# Patient Record
Sex: Female | Born: 1950
Health system: Southern US, Community
[De-identification: ages and names within clinical notes are randomized; demographics above are authoritative.]

## PROBLEM LIST (undated history)

## (undated) DIAGNOSIS — I639 Cerebral infarction, unspecified: Secondary | ICD-10-CM

## (undated) DIAGNOSIS — E790 Hyperuricemia without signs of inflammatory arthritis and tophaceous disease: Secondary | ICD-10-CM

## (undated) DIAGNOSIS — M1990 Unspecified osteoarthritis, unspecified site: Secondary | ICD-10-CM

## (undated) DIAGNOSIS — R7303 Prediabetes: Secondary | ICD-10-CM

## (undated) DIAGNOSIS — I739 Peripheral vascular disease, unspecified: Secondary | ICD-10-CM

## (undated) DIAGNOSIS — E785 Hyperlipidemia, unspecified: Secondary | ICD-10-CM

## (undated) DIAGNOSIS — I2510 Atherosclerotic heart disease of native coronary artery without angina pectoris: Secondary | ICD-10-CM

## (undated) DIAGNOSIS — M810 Age-related osteoporosis without current pathological fracture: Secondary | ICD-10-CM

## (undated) DIAGNOSIS — D649 Anemia, unspecified: Secondary | ICD-10-CM

## (undated) DIAGNOSIS — Z720 Tobacco use: Secondary | ICD-10-CM

## (undated) DIAGNOSIS — I10 Essential (primary) hypertension: Secondary | ICD-10-CM

## (undated) HISTORY — DX: Tobacco use: Z72.0

## (undated) HISTORY — DX: Hyperlipidemia, unspecified: E78.5

## (undated) HISTORY — DX: Peripheral vascular disease, unspecified: I73.9

## (undated) HISTORY — PX: JOINT REPLACEMENT: SHX530

## (undated) HISTORY — PX: OVARIAN CYST SURGERY: SHX726

## (undated) HISTORY — PX: CORONARY ANGIOPLASTY: SHX604

## (undated) HISTORY — DX: Hyperuricemia without signs of inflammatory arthritis and tophaceous disease: E79.0

## (undated) HISTORY — DX: Essential (primary) hypertension: I10

## (undated) HISTORY — DX: Age-related osteoporosis without current pathological fracture: M81.0

---

## 2005-01-14 ENCOUNTER — Emergency Department: Payer: Self-pay | Admitting: Unknown Physician Specialty

## 2005-01-14 ENCOUNTER — Other Ambulatory Visit: Payer: Self-pay

## 2005-08-05 ENCOUNTER — Ambulatory Visit: Payer: Self-pay | Admitting: Family Medicine

## 2007-06-25 ENCOUNTER — Emergency Department: Payer: Self-pay | Admitting: Emergency Medicine

## 2007-06-25 ENCOUNTER — Other Ambulatory Visit: Payer: Self-pay

## 2010-02-28 DIAGNOSIS — I739 Peripheral vascular disease, unspecified: Secondary | ICD-10-CM

## 2010-02-28 HISTORY — PX: ANGIOPLASTY: SHX39

## 2010-02-28 HISTORY — DX: Peripheral vascular disease, unspecified: I73.9

## 2010-03-17 ENCOUNTER — Ambulatory Visit: Payer: Self-pay | Admitting: Vascular Surgery

## 2010-05-01 HISTORY — PX: POPLITEAL ARTERY STENT: SHX2243

## 2010-05-12 ENCOUNTER — Ambulatory Visit: Payer: Self-pay | Admitting: Vascular Surgery

## 2010-07-01 HISTORY — PX: POPLITEAL ARTERY STENT: SHX2243

## 2010-07-07 ENCOUNTER — Ambulatory Visit: Payer: Self-pay | Admitting: Vascular Surgery

## 2010-08-31 HISTORY — PX: OTHER SURGICAL HISTORY: SHX169

## 2010-09-15 ENCOUNTER — Ambulatory Visit: Payer: Self-pay | Admitting: Vascular Surgery

## 2010-09-25 ENCOUNTER — Inpatient Hospital Stay: Payer: Self-pay | Admitting: Vascular Surgery

## 2010-09-29 LAB — PATHOLOGY REPORT

## 2010-10-17 ENCOUNTER — Inpatient Hospital Stay: Payer: Self-pay | Admitting: Vascular Surgery

## 2011-10-26 ENCOUNTER — Ambulatory Visit: Payer: Self-pay | Admitting: Vascular Surgery

## 2011-10-26 LAB — CREATININE, SERUM
Creatinine: 0.84 mg/dL (ref 0.60–1.30)
EGFR (African American): >60
EGFR (Non-African Amer.): >60

## 2011-10-26 LAB — BUN: BUN: 9 mg/dL (ref 7–18)

## 2011-10-26 LAB — PROTIME-INR
INR: 2
Prothrombin Time: 23.2 secs — ABNORMAL HIGH (ref 11.5–14.7)

## 2012-01-14 ENCOUNTER — Ambulatory Visit: Payer: Self-pay

## 2012-01-21 ENCOUNTER — Ambulatory Visit: Payer: Self-pay | Admitting: Vascular Surgery

## 2012-01-21 LAB — BASIC METABOLIC PANEL
Anion Gap: 10 (ref 7–16)
BUN: 9 mg/dL (ref 7–18)
Calcium, Total: 9.3 mg/dL (ref 8.5–10.1)
Chloride: 98 mmol/L (ref 98–107)
Co2: 30 mmol/L (ref 21–32)
Creatinine: 0.76 mg/dL (ref 0.60–1.30)
EGFR (African American): >60
EGFR (Non-African Amer.): >60
Glucose: 117 mg/dL — ABNORMAL HIGH (ref 65–99)
Osmolality: 275 (ref 275–301)
Potassium: 2.8 mmol/L — ABNORMAL LOW (ref 3.5–5.1)
Sodium: 138 mmol/L (ref 136–145)

## 2013-03-16 DIAGNOSIS — I70219 Atherosclerosis of native arteries of extremities with intermittent claudication, unspecified extremity: Secondary | ICD-10-CM | POA: Diagnosis not present

## 2013-05-30 DIAGNOSIS — Z0000 Encounter for general adult medical examination without abnormal findings: Secondary | ICD-10-CM | POA: Diagnosis not present

## 2013-05-30 DIAGNOSIS — E785 Hyperlipidemia, unspecified: Secondary | ICD-10-CM | POA: Diagnosis not present

## 2013-05-30 DIAGNOSIS — I10 Essential (primary) hypertension: Secondary | ICD-10-CM | POA: Diagnosis not present

## 2013-05-30 DIAGNOSIS — I2510 Atherosclerotic heart disease of native coronary artery without angina pectoris: Secondary | ICD-10-CM | POA: Diagnosis not present

## 2013-06-08 DIAGNOSIS — Z1231 Encounter for screening mammogram for malignant neoplasm of breast: Secondary | ICD-10-CM | POA: Diagnosis not present

## 2013-06-08 DIAGNOSIS — N63 Unspecified lump in unspecified breast: Secondary | ICD-10-CM | POA: Diagnosis not present

## 2013-06-16 DIAGNOSIS — N6489 Other specified disorders of breast: Secondary | ICD-10-CM | POA: Diagnosis not present

## 2013-06-16 DIAGNOSIS — R928 Other abnormal and inconclusive findings on diagnostic imaging of breast: Secondary | ICD-10-CM | POA: Diagnosis not present

## 2013-06-16 LAB — HM MAMMOGRAPHY

## 2013-08-15 DIAGNOSIS — I10 Essential (primary) hypertension: Secondary | ICD-10-CM | POA: Diagnosis not present

## 2013-08-15 DIAGNOSIS — E785 Hyperlipidemia, unspecified: Secondary | ICD-10-CM | POA: Diagnosis not present

## 2013-08-15 DIAGNOSIS — I70219 Atherosclerosis of native arteries of extremities with intermittent claudication, unspecified extremity: Secondary | ICD-10-CM | POA: Diagnosis not present

## 2013-11-15 DIAGNOSIS — I10 Essential (primary) hypertension: Secondary | ICD-10-CM | POA: Diagnosis not present

## 2013-12-06 DIAGNOSIS — M722 Plantar fascial fibromatosis: Secondary | ICD-10-CM | POA: Diagnosis not present

## 2014-02-13 DIAGNOSIS — F17200 Nicotine dependence, unspecified, uncomplicated: Secondary | ICD-10-CM | POA: Diagnosis not present

## 2014-02-13 DIAGNOSIS — I739 Peripheral vascular disease, unspecified: Secondary | ICD-10-CM | POA: Diagnosis not present

## 2014-02-13 DIAGNOSIS — I70219 Atherosclerosis of native arteries of extremities with intermittent claudication, unspecified extremity: Secondary | ICD-10-CM | POA: Diagnosis not present

## 2014-02-13 DIAGNOSIS — G609 Hereditary and idiopathic neuropathy, unspecified: Secondary | ICD-10-CM | POA: Diagnosis not present

## 2014-06-19 DIAGNOSIS — I8000 Phlebitis and thrombophlebitis of superficial vessels of unspecified lower extremity: Secondary | ICD-10-CM | POA: Diagnosis not present

## 2014-06-20 DIAGNOSIS — Z0000 Encounter for general adult medical examination without abnormal findings: Secondary | ICD-10-CM | POA: Diagnosis not present

## 2014-07-17 DIAGNOSIS — J42 Unspecified chronic bronchitis: Secondary | ICD-10-CM | POA: Diagnosis not present

## 2014-07-17 DIAGNOSIS — Z124 Encounter for screening for malignant neoplasm of cervix: Secondary | ICD-10-CM | POA: Diagnosis not present

## 2014-07-17 DIAGNOSIS — N898 Other specified noninflammatory disorders of vagina: Secondary | ICD-10-CM | POA: Diagnosis not present

## 2014-07-17 DIAGNOSIS — R05 Cough: Secondary | ICD-10-CM | POA: Diagnosis not present

## 2014-07-17 LAB — HM PAP SMEAR: HM Pap smear: NEGATIVE

## 2014-08-28 DIAGNOSIS — I739 Peripheral vascular disease, unspecified: Secondary | ICD-10-CM | POA: Diagnosis not present

## 2014-08-28 DIAGNOSIS — I10 Essential (primary) hypertension: Secondary | ICD-10-CM | POA: Diagnosis not present

## 2014-08-28 DIAGNOSIS — E785 Hyperlipidemia, unspecified: Secondary | ICD-10-CM | POA: Diagnosis not present

## 2014-09-05 DIAGNOSIS — I2510 Atherosclerotic heart disease of native coronary artery without angina pectoris: Secondary | ICD-10-CM | POA: Diagnosis not present

## 2014-09-05 DIAGNOSIS — I739 Peripheral vascular disease, unspecified: Secondary | ICD-10-CM | POA: Diagnosis not present

## 2014-09-05 DIAGNOSIS — I209 Angina pectoris, unspecified: Secondary | ICD-10-CM | POA: Diagnosis not present

## 2014-09-05 DIAGNOSIS — R011 Cardiac murmur, unspecified: Secondary | ICD-10-CM | POA: Diagnosis not present

## 2014-09-26 DIAGNOSIS — R011 Cardiac murmur, unspecified: Secondary | ICD-10-CM | POA: Diagnosis not present

## 2014-09-26 DIAGNOSIS — I2510 Atherosclerotic heart disease of native coronary artery without angina pectoris: Secondary | ICD-10-CM | POA: Diagnosis not present

## 2014-10-03 DIAGNOSIS — I739 Peripheral vascular disease, unspecified: Secondary | ICD-10-CM | POA: Diagnosis not present

## 2014-10-03 DIAGNOSIS — I2510 Atherosclerotic heart disease of native coronary artery without angina pectoris: Secondary | ICD-10-CM | POA: Diagnosis not present

## 2014-10-03 DIAGNOSIS — I10 Essential (primary) hypertension: Secondary | ICD-10-CM | POA: Diagnosis not present

## 2014-10-03 DIAGNOSIS — F17200 Nicotine dependence, unspecified, uncomplicated: Secondary | ICD-10-CM | POA: Diagnosis not present

## 2014-10-17 DIAGNOSIS — I10 Essential (primary) hypertension: Secondary | ICD-10-CM | POA: Diagnosis not present

## 2014-10-17 DIAGNOSIS — Z79891 Long term (current) use of opiate analgesic: Secondary | ICD-10-CM | POA: Diagnosis not present

## 2014-10-19 DIAGNOSIS — F17290 Nicotine dependence, other tobacco product, uncomplicated: Secondary | ICD-10-CM | POA: Diagnosis not present

## 2014-10-19 DIAGNOSIS — I10 Essential (primary) hypertension: Secondary | ICD-10-CM | POA: Diagnosis not present

## 2014-10-19 DIAGNOSIS — E876 Hypokalemia: Secondary | ICD-10-CM | POA: Diagnosis not present

## 2014-10-19 DIAGNOSIS — I2510 Atherosclerotic heart disease of native coronary artery without angina pectoris: Secondary | ICD-10-CM | POA: Diagnosis not present

## 2014-10-26 DIAGNOSIS — I10 Essential (primary) hypertension: Secondary | ICD-10-CM | POA: Diagnosis not present

## 2014-10-26 DIAGNOSIS — I2510 Atherosclerotic heart disease of native coronary artery without angina pectoris: Secondary | ICD-10-CM | POA: Diagnosis not present

## 2014-10-26 DIAGNOSIS — E876 Hypokalemia: Secondary | ICD-10-CM | POA: Diagnosis not present

## 2014-12-23 NOTE — Op Note (Signed)
PATIENT NAME:  Mary [name], Mary [name] MR#:  [id number] DATE OF BIRTH:  08-29-1951  DATE OF PROCEDURE:  01/21/2012  PREOPERATIVE DIAGNOSES:  1. Peripheral arterial disease with rest pain with acute worsening over the past week or two.  2. Multiple previous right lower extremity revascularizations.  3. Hypertension.   POSTOPERATIVE DIAGNOSES:  1. Peripheral arterial disease with rest pain with acute worsening over the past week or two.  2. Multiple previous right lower extremity revascularizations.  3. Hypertension.   PROCEDURES:    1. Catheter placement into right profunda femoris artery and the third order branch of the profunda femoris artery from left femoral approach.  2. Aortogram and selective right lower extremity angiogram.  3. Catheter directed thrombolysis with 4 mg of TPA in the right common femoral artery and profunda femoris artery.  4. Mechanical rheolytic thrombectomy to right common femoral artery and profunda femoris artery.  5. Percutaneous transluminal angioplasty with 4 mm diameter angioplasty balloon to second order profunda femoris branch.  6. Percutaneous transluminal angioplasty with 6 mm diameter angioplasty balloon to proximal primary profunda and common femoral artery.  7. StarClose closure device, left femoral artery.   SURGEON: Algernon Huxley, M.D.   ANESTHESIA: Local with moderate conscious sedation.   ESTIMATED BLOOD LOSS: Minimal.   INDICATION FOR PROCEDURE: This is a 64 year old African American female with severe peripheral arterial disease. She has undergone previous operations for the right lower extremity for ischemia including failed bypass and multiple percutaneous revascularizations. She had a known SFA occlusion and had done as well as possible with short distance claudication until recently when she developed severe ischemic rest pain of the right foot. Her noninvasive studies also showed a drop in her perfusion on that side. She is brought in for an  angiogram to see if any intervention is at all possible. Risks and benefits are discussed. Informed consent was obtained.   DESCRIPTION OF PROCEDURE: The patient was brought to the vascular interventional radiology suite. Groins were shaved and prepped and a sterile surgical field was created. The left femoral head was localized with fluoroscopy and the left femoral artery was accessed without difficulty with a Seldinger needle. A J-wire was placed and a 5 French sheath was placed. A pigtail catheter was placed in the aorta at the L1 level. AP aortogram was performed. This showed no flow-limiting stenosis in the aortoiliac segments with some diffuse calcification with single renal vessels bilaterally. I then hooked the aortic bifurcation and advanced to the right femoral head. This demonstrated thrombus and occlusion in the common femoral artery and the previously patent profunda femoris artery. The superficial femoral artery and her bypass graft were known to be chronically occluded. She did reconstitute her profunda distally. The patient was given 3,000 units of intravenous heparin for systemic anticoagulation. A 6 French Ansel sheath was placed over a Terumo advantage wire. I was able to cross into the distal profunda after several branches into what was essentially a tertiary profunda branch and confirm intraluminal flow with a Kumpe catheter. I then replaced the Terumo advantage wire. On imaging, there was clearly some acute or subacute thrombus in this region. For this reason, I placed 4 mg of TPA for thrombolysis and then performed mechanical rheolytic thrombectomy with the AngioJet proxy catheter in the common femoral and profunda femoris arteries. The TPA was allowed to dwell for 10 to 15 minutes. Following this, angioplasty was performed down the second order profunda branch for significant residual flow limitation secondary to thrombosis. The  proximal profunda femoris artery and common femoral artery  had some native disease as well as thrombosis that was treated with a 6 mm diameter angioplasty balloon and then a 7 mm diameter angioplasty balloon. Following this, there was markedly improved flow. The profunda was now patent. There were some irregularities proximally, but this was a terrible location for stent placement and so this was not performed. The flow was markedly improved through profunda collaterals. At this point, I elected to terminate the procedure. The sheath was pulled back to the ipsilateral external iliac artery and oblique arteriogram was performed. StarClose closure device was deployed in the usual fashion with excellent hemostatic result. The patient tolerated the procedure well and was taken to the recovery room in stable condition.   ____________________________ Algernon Huxley, MD jsd:ap D: 01/21/2012 12:46:37 ET                T: 01/21/2012 15:45:07 ET JOB#: [id number] cc: Algernon Huxley, MD, <Dictator> Algernon Huxley MD ELECTRONICALLY SIGNED 01/27/2012 14:40

## 2014-12-23 NOTE — Op Note (Signed)
PATIENT NAME:  Mary [name], PINARD MR#:  [id number] DATE OF BIRTH:  Jan 26, 1951  DATE OF PROCEDURE:  10/26/2011  PREOPERATIVE DIAGNOSES:  1. Peripheral arterial disease with ulceration right lower extremity.  2. Status post failed percutaneous and open revascularization right lower extremity.  3. Hypertension.   POSTOPERATIVE DIAGNOSES:  1. Peripheral arterial disease with ulceration right lower extremity.  2. Status post failed percutaneous and open revascularization right lower extremity.  3. Hypertension.   PROCEDURE: 1. Ultrasound guidance for vascular access, left femoral artery.  2. Catheter placement into right popliteal artery from left femoral approach.  3. Catheter placement into right profunda femoris artery from left femoral approach.  4. Aortogram and selective right lower extremity angiogram.  5. Percutaneous transluminal angioplasty of proximal right profunda femoris artery with a 5-mm diameter angioplasty balloon.  6. Percutaneous transluminal angioplasty of right common femoral artery with 6 and 7-mm diameter angioplasty balloons.  7. StarClose closure device, left femoral artery.   SURGEON: Algernon Huxley, M.D.   ANESTHESIA: Local with moderate conscious sedation.   ESTIMATED BLOOD LOSS: 25 mL.   INDICATION FOR PROCEDURE: This is a female well known to me for her severe peripheral arterial disease. She has had progression of her right foot pain to the point where she now has ulceration, which is nonhealing, and ischemic rest pain. The patient is brought back for angiographic evaluation to determine if any endovascular options or open options are available at this point prior to amputation.   DESCRIPTION OF PROCEDURE: The patient is brought to the vascular/interventional radiology suite. Groins were shaved and prepped and a sterile surgical field was created. Due to her multiple previous accesses on the left, ultrasound was used to access a patent left femoral artery with  minimal difficulty, and permanent image was recorded. A 5 French sheath was placed. Pigtail catheter was placed in the aorta at the L1 level and AP aortogram was performed. This showed normal renal vessels with no flow-limiting stenosis in the aortic or  iliac segments. I then hooked the aortic bifurcation and advanced to the right femoral head. Selective right lower extremity angiogram was then performed. This showed a stenosis in the common femoral artery just proximal to the previous bypass graft, which may have represented intimal hyperplasia from previous clamp. This tracked down into the origin of the profunda artery, which seemed a bit narrowed. There were then good profunda collaterals down to an anterior tibial artery which was the only runoff distally. She had a long segment SFA occlusion. The previously placed right SFA and popliteal stents were occluded. The patient was given 3000 units of intravenous heparin for systemic anticoagulation.  I spent some time trying to cross her SFA popliteal occlusion, and could get into the stents in the popliteal artery but could never reenter  distally. I would be somewhat pessimistic that any of these interventions would be durable as they have not been previously. After about 20 minutes of fluoroscopy. I elected to terminate this effort at this point.  I came back up and got a wire into the profunda femoris artery and exchanged for a Magic torque wire over a Kumpe catheter. Imaging in this area allowed [place] to further delineate the hyperplastic lesion in the common femoral artery and this was angioplastied with a 6 and a 7-mm diameter angioplasty balloon. A 5-mm diameter angioplasty balloon was inflated in the proximal profunda femoris artery. Following this there was improved flow. There was significant irregularity in the common femoral artery  and into the superficial femoral artery, likely due to the plane into the SFA that was generated with our attempt at  revascularizing that long-segment occlusion. At this point, the flow had been improved as best we could endovascular.  She would potentially have a femoral to anterior tibial bypass option remaining.  I elected to terminate the procedure. The sheath was pulled back to the ipsilateral external iliac artery.  Oblique arteriogram was performed.  StarClose closure device was deployed in the usual fashion with excellent hemostatic result. The patient tolerated the procedure well and was taken to the recovery room in stable condition.   ____________________________ Algernon Huxley, MD jsd:bjt D: 10/26/2011 11:39:23 ET T: 10/26/2011 11:51:11 ET JOB#: [id number]  cc: Algernon Huxley, MD, <Dictator> Algernon Huxley MD ELECTRONICALLY SIGNED 10/26/2011 15:49

## 2015-02-25 DIAGNOSIS — I10 Essential (primary) hypertension: Secondary | ICD-10-CM | POA: Insufficient documentation

## 2015-02-25 DIAGNOSIS — E785 Hyperlipidemia, unspecified: Secondary | ICD-10-CM | POA: Insufficient documentation

## 2015-02-25 DIAGNOSIS — E782 Mixed hyperlipidemia: Secondary | ICD-10-CM | POA: Insufficient documentation

## 2015-02-25 DIAGNOSIS — I739 Peripheral vascular disease, unspecified: Secondary | ICD-10-CM | POA: Insufficient documentation

## 2015-02-25 DIAGNOSIS — Z720 Tobacco use: Secondary | ICD-10-CM | POA: Insufficient documentation

## 2015-02-25 DIAGNOSIS — E790 Hyperuricemia without signs of inflammatory arthritis and tophaceous disease: Secondary | ICD-10-CM | POA: Insufficient documentation

## 2015-02-27 ENCOUNTER — Ambulatory Visit (INDEPENDENT_AMBULATORY_CARE_PROVIDER_SITE_OTHER): Payer: Medicare Other | Admitting: Family Medicine

## 2015-02-27 ENCOUNTER — Encounter: Payer: Self-pay | Admitting: Family Medicine

## 2015-02-27 VITALS — BP 139/84 | HR 87 | Temp 98.2°F | Wt 132.0 lb

## 2015-02-27 DIAGNOSIS — T502X5A Adverse effect of carbonic-anhydrase inhibitors, benzothiadiazides and other diuretics, initial encounter: Secondary | ICD-10-CM | POA: Diagnosis not present

## 2015-02-27 DIAGNOSIS — E876 Hypokalemia: Secondary | ICD-10-CM | POA: Diagnosis not present

## 2015-02-27 DIAGNOSIS — M10272 Drug-induced gout, left ankle and foot: Secondary | ICD-10-CM | POA: Diagnosis not present

## 2015-02-27 DIAGNOSIS — I10 Essential (primary) hypertension: Secondary | ICD-10-CM | POA: Diagnosis not present

## 2015-02-27 DIAGNOSIS — E785 Hyperlipidemia, unspecified: Secondary | ICD-10-CM

## 2015-02-27 DIAGNOSIS — Z720 Tobacco use: Secondary | ICD-10-CM

## 2015-02-27 DIAGNOSIS — E790 Hyperuricemia without signs of inflammatory arthritis and tophaceous disease: Secondary | ICD-10-CM | POA: Diagnosis not present

## 2015-02-27 DIAGNOSIS — Z5181 Encounter for therapeutic drug level monitoring: Secondary | ICD-10-CM | POA: Diagnosis not present

## 2015-02-27 DIAGNOSIS — Z7901 Long term (current) use of anticoagulants: Secondary | ICD-10-CM | POA: Diagnosis not present

## 2015-02-27 MED ORDER — TRAMADOL HCL 50 MG PO TABS: 50.0000 mg | ORAL_TABLET | Freq: Four times a day (QID) | ORAL | Status: DC | PRN

## 2015-02-27 MED ORDER — LISINOPRIL 10 MG PO TABS: 10.0000 mg | ORAL_TABLET | Freq: Every day | ORAL | Status: DC

## 2015-02-27 MED ORDER — COLCHICINE 0.6 MG PO TABS: ORAL_TABLET | ORAL | Status: DC

## 2015-02-27 MED ORDER — FERROUS FUMARATE 86 (27 FE) MG PO CAPS: 1.0000 | ORAL_CAPSULE | ORAL | Status: DC

## 2015-02-27 NOTE — Assessment & Plan Note (Signed)
Monitored by Dr. Lucky Cowboy; on statin

## 2015-02-27 NOTE — Patient Instructions (Addendum)
Stop HCTZ Stop potassium pills  Medications Discontinued During This Encounter  Medication Reason  . hydrochlorothiazide (HYDRODIURIL) 25 MG tablet Discontinued by provider  . potassium chloride (K-DUR) 10 MEQ tablet Discontinued by provider    Start colchicine today for the gout attack Take one Aleve twice a day for the next week to help with gout (take with food) Use tramadol if needed for pain Start lisinopril for your blood pressure  Meds ordered this encounter  Medications  . warfarin (COUMADIN) 5 MG tablet    Sig: Take 5 mg by mouth.  . colchicine 0.6 MG tablet    Sig: Take two pills by mouth right away, and then one more pill one hour later (for gout attack)    Dispense:  3 tablet    Refill:  1  . traMADol (ULTRAM) 50 MG tablet    Sig: Take 1 tablet (50 mg total) by mouth every 6 (six) hours as needed.    Dispense:  12 tablet    Refill:  0  . Ferrous Fumarate (HIGH POTENCY IRON) 86 (27 FE) MG CAPS    Sig: Take 1 capsule by mouth every other day.    Dispense:  15 each    Refill:  0  . lisinopril (PRINIVIL,ZESTRIL) 10 MG tablet    Sig: Take 1 tablet (10 mg total) by mouth daily.    Dispense:  30 tablet    Refill:  0   Return in 3 weeks for recheck of the blood pressure and BMP (you do not need to be fasting for that)  Avoid gravies, liver, [place], chicken soup  Let Dr. Lucky Cowboy know about the medicine changes because the medicine changes may affect your INR (warfarin effect); he may want to check it sooner than your next scheduled visit  I'll strongly encourage you to quit smoking; it will be the best thing you can do for your health and your wallet    Gout Gout is when your joints become red, sore, and swell (inflamed). This is caused by the buildup of uric acid crystals in the joints. Uric acid is a chemical that is normally in the blood. If the level of uric acid gets too high in the blood, these crystals form in your joints and tissues. Over time, these crystals can  form into masses near the joints and tissues. These masses can destroy bone and cause the bone to look misshapen (deformed). HOME CARE   Do not take aspirin for pain.  Only take medicine as told by your doctor.  Rest the joint as much as you can. When in bed, keep sheets and blankets off painful areas.  Keep the sore joints raised (elevated).  Put warm or cold packs on painful joints. Use of warm or cold packs depends on which works best for you.  Use crutches if the painful joint is in your leg.  Drink enough fluids to keep your pee (urine) clear or pale yellow. Limit alcohol, sugary drinks, and drinks with fructose in them.  Follow your diet instructions. Pay careful attention to how much protein you eat. Include fruits, vegetables, whole grains, and fat-free or low-fat milk products in your daily diet. Talk to your doctor or dietitian about the use of coffee, vitamin C, and cherries. These may help lower uric acid levels.  Keep a healthy body weight. GET HELP RIGHT AWAY IF:   You have watery poop (diarrhea), throw up (vomit), or have any side effects from medicines.  You do not  feel better in 24 hours, or you are getting worse.  Your joint becomes suddenly more tender, and you have chills or a fever. MAKE SURE YOU:   Understand these instructions.  Will watch your condition.  Will get help right away if you are not doing well or get worse. Document Released: 05/26/2008 Document Revised: 01/01/2014 Document Reviewed: 03/30/2012 [facility] Patient Information 2015 [place], [place]. This information is not intended to replace advice given to you by your health care provider. Make sure you discuss any questions you have with your health care provider. Smoking Cessation, Tips for Success If you are ready to quit smoking, congratulations! You have chosen to help yourself be healthier. Cigarettes bring nicotine, tar, carbon monoxide, and other irritants into your body. Your lungs,  heart, and blood vessels will be able to work better without these poisons. There are many different ways to quit smoking. Nicotine gum, nicotine patches, a nicotine inhaler, or nicotine nasal spray can help with physical craving. Hypnosis, support groups, and medicines help break the habit of smoking. WHAT THINGS CAN I DO TO MAKE QUITTING EASIER?  Here are some tips to help you quit for good:  Pick a date when you will quit smoking completely. Tell all of your friends and family about your plan to quit on that date.  Do not try to slowly cut down on the number of cigarettes you are smoking. Pick a quit date and quit smoking completely starting on that day.  Throw away all cigarettes.   Clean and remove all ashtrays from your home, work, and car.  On a card, write down your reasons for quitting. Carry the card with you and read it when you get the urge to smoke.  Cleanse your body of nicotine. Drink enough water and fluids to keep your urine clear or pale yellow. Do this after quitting to flush the nicotine from your body.  Learn to predict your moods. Do not let a bad situation be your excuse to have a cigarette. Some situations in your life might tempt you into wanting a cigarette.  Never have "just one" cigarette. It leads to wanting another and another. Remind yourself of your decision to quit.  Change habits associated with smoking. If you smoked while driving or when feeling stressed, try other activities to replace smoking. Stand up when drinking your coffee. Brush your teeth after eating. Sit in a different chair when you read the paper. Avoid alcohol while trying to quit, and try to drink fewer caffeinated beverages. Alcohol and caffeine may urge you to smoke.  Avoid foods and drinks that can trigger a desire to smoke, such as sugary or spicy foods and alcohol.  Ask people who smoke not to smoke around you.  Have something planned to do right after eating or having a cup of  coffee. For example, plan to take a walk or exercise.  Try a relaxation exercise to calm you down and decrease your stress. Remember, you may be tense and nervous for the first 2 weeks after you quit, but this will pass.  Find new activities to keep your hands busy. Play with a pen, coin, or rubber band. Doodle or draw things on paper.  Brush your teeth right after eating. This will help cut down on the craving for the taste of tobacco after meals. You can also try mouthwash.   Use oral substitutes in place of cigarettes. Try using lemon drops, carrots, cinnamon sticks, or chewing gum. Keep them handy so  they are available when you have the urge to smoke.  When you have the urge to smoke, try deep breathing.  Designate your home as a nonsmoking area.  If you are a heavy smoker, ask your health care provider about a prescription for nicotine chewing gum. It can ease your withdrawal from nicotine.  Reward yourself. Set aside the cigarette money you save and buy yourself something nice.  Look for support from others. Join a support group or smoking cessation program. Ask someone at home or at work to help you with your plan to quit smoking.  Always ask yourself, "Do I need this cigarette or is this just a reflex?" Tell yourself, "Today, I choose not to smoke," or "I do not want to smoke." You are reminding yourself of your decision to quit.  Do not replace cigarette smoking with electronic cigarettes (commonly called e-cigarettes). The safety of e-cigarettes is unknown, and some may contain harmful chemicals.  If you relapse, do not give up! Plan ahead and think about what you will do the next time you get the urge to smoke. HOW WILL I FEEL WHEN I QUIT SMOKING? You may have symptoms of withdrawal because your body is used to nicotine (the addictive substance in cigarettes). You may crave cigarettes, be irritable, feel very hungry, cough often, get headaches, or have difficulty concentrating.  The withdrawal symptoms are only temporary. They are strongest when you first quit but will go away within 10-14 days. When withdrawal symptoms occur, stay in control. Think about your reasons for quitting. Remind yourself that these are signs that your body is healing and getting used to being without cigarettes. Remember that withdrawal symptoms are easier to treat than the major diseases that smoking can cause.  Even after the withdrawal is over, expect periodic urges to smoke. However, these cravings are generally short lived and will go away whether you smoke or not. Do not smoke! WHAT RESOURCES ARE AVAILABLE TO HELP ME QUIT SMOKING? Your health care provider can direct you to community resources or hospitals for support, which may include:  Group support.  Education.  Hypnosis.  Therapy. Document Released: 05/15/2004 Document Revised: 01/01/2014 Document Reviewed: 02/02/2013 [facility] Patient Information 2015 [place], [place]. This information is not intended to replace advice given to you by your health care provider. Make sure you discuss any questions you have with your health care provider.

## 2015-02-27 NOTE — Progress Notes (Signed)
BP 139/84 mmHg  Pulse 87  Temp(Src) 98.2 F (36.8 C)  Wt 132 lb (59.875 kg)  SpO2 98%   Subjective:    Patient ID: Mary [name], female    DOB: 10/28/1950, 64 y.o.   MRN: [id number]  HPI: Mary [name] is a 64 y.o. female  Chief Complaint  Patient presents with  . Foot Swelling    left X 1 week   Left foot base of 1st MTP Soaking it in warm water Taking extra-strength tylenol, a little bit of relief Hurt to bend the great toe No injury Pain is a 6 or 7 out of 10 Sore to the touch, wearing loose shoes Not working, on disability since 2012 First time she has ever had anything like this Brother had gout; it was in his knee  She takes HCTZ for high blood pressure; on it for 15 years; no other medicines for BP  Relevant past medical, surgical, family and social history reviewed and updated as indicated. Interim medical history since our last visit reviewed. Allergies and medications reviewed and updated.  Review of Systems  Per HPI unless specifically indicated above     Objective:    BP 139/84 mmHg  Pulse 87  Temp(Src) 98.2 F (36.8 C)  Wt 132 lb (59.875 kg)  SpO2 98%  Wt Readings from Last 3 Encounters:  02/27/15 132 lb (59.875 kg)  10/26/14 139 lb (63.05 kg)    Physical Exam  Constitutional: She appears well-developed and well-nourished. No distress.  HENT:  Head: Atraumatic.  Eyes: EOM are normal. No scleral icterus.  Neck: No thyromegaly present.  Cardiovascular: Normal rate, regular rhythm and normal heart sounds.   Pulmonary/Chest: Effort normal and breath sounds normal.  Abdominal: She exhibits no distension.  Musculoskeletal:  Marked swelling and tenderness of the 1st MTP on the left foot; limited ROM; no evidence of injury or trauma  Neurological: She exhibits normal muscle tone.  Skin: Skin is warm and dry.  Psychiatric: She has a normal mood and affect. Her behavior is normal. Judgment and thought content normal.       Assessment & Plan:    Problem List Items Addressed This Visit      Cardiovascular and Mediastinum   Hypertension    Stop HCTZ and start lisinopril; return in 3 weeks for recheck, bmp at that visit with me      Relevant Medications   warfarin (COUMADIN) 5 MG tablet   lisinopril (PRINIVIL,ZESTRIL) 10 MG tablet     Other   Hyperlipidemia    Monitored by Dr. Lucky Cowboy; on statin      Relevant Medications   warfarin (COUMADIN) 5 MG tablet   lisinopril (PRINIVIL,ZESTRIL) 10 MG tablet   Tobacco abuse    Encouraged smoking cessation in the wrap-up; tips for success given      Hyperuricemia    Likely secondary to HCTZ; stop it and start ACE-I instead      Encounter for monitoring coumadin therapy    Encouraged pt to contact the doctor who monitors her warfarin/Coumadin, as these changes will likely affect her INR; she will call Dr. Bunnie Domino office       Other Visit Diagnoses    Acute drug-induced gout of left foot    -  Primary    Relevant Medications    colchicine 0.6 MG tablet    traMADol (ULTRAM) 50 MG tablet    Other Relevant Orders    Uric acid    CBC with Differential/Platelet  Basic Metabolic Panel (BMET)    Diuretic-induced hypokalemia        will likely not be a problem now that we're stopping HCTZ; check BMP in 3 weeks; do not take extra K+ on the new BP med (ACE-I)    Relevant Orders    Basic Metabolic Panel (BMET)       Meds ordered this encounter  Medications  . warfarin (COUMADIN) 5 MG tablet    Sig: Take 5 mg by mouth.  . colchicine 0.6 MG tablet    Sig: Take two pills by mouth right away, and then one more pill one hour later (for gout attack)    Dispense:  3 tablet    Refill:  1  . traMADol (ULTRAM) 50 MG tablet    Sig: Take 1 tablet (50 mg total) by mouth every 6 (six) hours as needed.    Dispense:  12 tablet    Refill:  0  . Ferrous Fumarate (HIGH POTENCY IRON) 86 (27 FE) MG CAPS    Sig: Take 1 capsule by mouth every other day.    Dispense:  15 each    Refill:  0  .  lisinopril (PRINIVIL,ZESTRIL) 10 MG tablet    Sig: Take 1 tablet (10 mg total) by mouth daily.    Dispense:  30 tablet    Refill:  0   Orders Placed This Encounter  Procedures  . Uric acid  . CBC with Differential/Platelet  . Basic Metabolic Panel (BMET)   Follow up plan: Return in about 3 weeks (around 03/20/2015) for HTN, gout. An After Visit Summary was printed and given to the patient.

## 2015-02-27 NOTE — Assessment & Plan Note (Signed)
Stop HCTZ and start lisinopril; return in 3 weeks for recheck, bmp at that visit with me

## 2015-02-27 NOTE — Assessment & Plan Note (Signed)
Likely secondary to HCTZ; stop it and start ACE-I instead

## 2015-02-27 NOTE — Assessment & Plan Note (Signed)
Encouraged smoking cessation in the wrap-up; tips for success given

## 2015-02-27 NOTE — Assessment & Plan Note (Signed)
Encouraged pt to contact the doctor who monitors her warfarin/Coumadin, as these changes will likely affect her INR; she will call Dr. Bunnie Domino office

## 2015-02-28 LAB — CBC WITH DIFFERENTIAL/PLATELET
Basophils Absolute: 0 10*3/uL (ref 0.0–0.2)
Basos: 0 %
EOS (ABSOLUTE): 0.2 10*3/uL (ref 0.0–0.4)
Eos: 2 %
Hematocrit: 39.5 % (ref 34.0–46.6)
Hemoglobin: 13.9 g/dL (ref 11.1–15.9)
Immature Grans (Abs): 0 10*3/uL (ref 0.0–0.1)
Immature Granulocytes: 0 %
Lymphocytes Absolute: 4.4 10*3/uL — ABNORMAL HIGH (ref 0.7–3.1)
Lymphs: 59 %
MCH: 30.3 pg (ref 26.6–33.0)
MCHC: 35.2 g/dL (ref 31.5–35.7)
MCV: 86 fL (ref 79–97)
Monocytes Absolute: 0.5 10*3/uL (ref 0.1–0.9)
Monocytes: 7 %
Neutrophils Absolute: 2.4 10*3/uL (ref 1.4–7.0)
Neutrophils: 32 %
Platelets: 351 10*3/uL (ref 150–379)
RBC: 4.59 x10E6/uL (ref 3.77–5.28)
RDW: 14.3 % (ref 12.3–15.4)
WBC: 7.5 10*3/uL (ref 3.4–10.8)

## 2015-02-28 LAB — BASIC METABOLIC PANEL
BUN/Creatinine Ratio: 10 — ABNORMAL LOW (ref 11–26)
BUN: 7 mg/dL — ABNORMAL LOW (ref 8–27)
CO2: 29 mmol/L (ref 18–29)
Calcium: 9.3 mg/dL (ref 8.7–10.3)
Chloride: 95 mmol/L — ABNORMAL LOW (ref 97–108)
Creatinine, Ser: 0.69 mg/dL (ref 0.57–1.00)
GFR calc Af Amer: 109 mL/min/{1.73_m2} (ref >59)
GFR calc non Af Amer: 94 mL/min/{1.73_m2} (ref >59)
Glucose: 83 mg/dL (ref 65–99)
Potassium: 3.1 mmol/L — ABNORMAL LOW (ref 3.5–5.2)
Sodium: 141 mmol/L (ref 134–144)

## 2015-02-28 LAB — URIC ACID: Uric Acid: 8.7 mg/dL — ABNORMAL HIGH (ref 2.5–7.1)

## 2015-03-14 ENCOUNTER — Telehealth: Payer: Self-pay | Admitting: Family Medicine

## 2015-03-14 NOTE — Telephone Encounter (Signed)
Patient just wanted clarification on medications. She has stopped the HCTZ and started the new BP med.

## 2015-03-14 NOTE — Telephone Encounter (Signed)
Pt called would like Amy to call her concerning the following:  1- Gout medication and BP medication, should she stay on new BP medication  2- She should still take her Atorvastat?   Please call pt ASAP.

## 2015-03-20 ENCOUNTER — Ambulatory Visit: Payer: Medicare Other | Admitting: Family Medicine

## 2015-04-16 ENCOUNTER — Ambulatory Visit (INDEPENDENT_AMBULATORY_CARE_PROVIDER_SITE_OTHER): Payer: Medicare Other | Admitting: Family Medicine

## 2015-04-16 ENCOUNTER — Encounter: Payer: Self-pay | Admitting: Family Medicine

## 2015-04-16 VITALS — BP 139/82 | HR 79 | Temp 97.6°F | Ht 65.0 in | Wt 129.0 lb

## 2015-04-16 DIAGNOSIS — E790 Hyperuricemia without signs of inflammatory arthritis and tophaceous disease: Secondary | ICD-10-CM | POA: Diagnosis not present

## 2015-04-16 DIAGNOSIS — Z7901 Long term (current) use of anticoagulants: Secondary | ICD-10-CM

## 2015-04-16 DIAGNOSIS — I10 Essential (primary) hypertension: Secondary | ICD-10-CM

## 2015-04-16 DIAGNOSIS — E876 Hypokalemia: Secondary | ICD-10-CM | POA: Insufficient documentation

## 2015-04-16 DIAGNOSIS — Z5181 Encounter for therapeutic drug level monitoring: Secondary | ICD-10-CM

## 2015-04-16 MED ORDER — AMLODIPINE BESYLATE 2.5 MG PO TABS: 2.5000 mg | ORAL_TABLET | Freq: Every day | ORAL | Status: DC

## 2015-04-16 NOTE — Assessment & Plan Note (Signed)
Might be due to HCTZ; check labs today and replace if needed

## 2015-04-16 NOTE — Assessment & Plan Note (Signed)
Not quite to goal; she did not tolerate ACE-I; will start low dose CCB instead; avoid salt and decongestants

## 2015-04-16 NOTE — Assessment & Plan Note (Signed)
Limit purine-rich foods; check uric acid today

## 2015-04-16 NOTE — Assessment & Plan Note (Signed)
Monitored by Dr. Lucky Cowboy

## 2015-04-16 NOTE — Progress Notes (Signed)
BP 139/82 mmHg  Pulse 79  Temp(Src) 97.6 F (36.4 C)  Ht 5\' 5"  (1.651 m)  Wt 129 lb (58.514 kg)  BMI 21.47 kg/m2  SpO2 96%   Subjective:    Patient ID: [name], female    DOB: [birth year], 64 y.o.   MRN: [id number]  HPI: Mary [name] is a 64 y.o. female  Chief Complaint  Patient presents with  . Gout    recheck  . Hypertension    she could not tolerate the new BP med, started back on previous med    She had a flare of gout at last visit; HCTZ was stopped but she didn't tolerate the new medicine and went back on the HCTZ on her own; she made that change about two weeks ago The toe is better; she ate some chicken livers yesterday, just a little; limiting [place] and chicken noodle soups and gravies; used to love chicken noodle soup There is a positive family hx of gout She hit the right shin yesterday, swelling; hasn't used ice yet She has high blood pressure, but as noted, she stopped the new medicine and went back to her HCTZ  Relevant past medical, surgical, family and social history reviewed and updated as indicated. Interim medical history since our last visit reviewed. Allergies and medications reviewed and updated.  Review of Systems Per HPI unless specifically indicated above     Objective:    BP 139/82 mmHg  Pulse 79  Temp(Src) 97.6 F (36.4 C)  Ht 5\' 5"  (1.651 m)  Wt 129 lb (58.514 kg)  BMI 21.47 kg/m2  SpO2 96%  Wt Readings from Last 3 Encounters:  04/16/15 129 lb (58.514 kg)  02/27/15 132 lb (59.875 kg)  10/26/14 139 lb (63.05 kg)    Physical Exam  Constitutional: She appears well-developed and well-nourished. No distress.  Eyes: EOM are normal. No scleral icterus.  Neck: No thyromegaly present.  Cardiovascular: Normal rate, regular rhythm and normal heart sounds.   No murmur heard. Pulmonary/Chest: Effort normal and breath sounds normal. No respiratory distress. She has no wheezes.  Abdominal: Soft. Bowel sounds are normal. She exhibits no  distension.  Musculoskeletal: Normal range of motion. She exhibits no edema.       Right foot: There is swelling (some enlargement of the right 1st MTP; nontender).  Neurological: She is alert. She exhibits normal muscle tone.  Skin: Skin is warm and dry. She is not diaphoretic. No pallor.  Psychiatric: She has a normal mood and affect. Her behavior is normal. Judgment and thought content normal.    Results for orders placed or performed in visit on 02/27/15  Uric acid  Result Value Ref Range   Uric Acid 8.7 (H) 2.5 - 7.1 mg/dL  CBC with Differential/Platelet  Result Value Ref Range   WBC 7.5 3.4 - 10.8 x10E3/uL   RBC 4.59 3.77 - 5.28 x10E6/uL   Hemoglobin 13.9 11.1 - 15.9 g/dL   Hematocrit 39.5 34.0 - 46.6 %   MCV 86 79 - 97 fL   MCH 30.3 26.6 - 33.0 pg   MCHC 35.2 31.5 - 35.7 g/dL   RDW 14.3 12.3 - 15.4 %   Platelets 351 150 - 379 x10E3/uL   Neutrophils 32 %   Lymphs 59 %   Monocytes 7 %   Eos 2 %   Basos 0 %   Neutrophils Absolute 2.4 1.4 - 7.0 x10E3/uL   Lymphocytes Absolute 4.4 (H) 0.7 - 3.1 x10E3/uL  Monocytes Absolute 0.5 0.1 - 0.9 x10E3/uL   EOS (ABSOLUTE) 0.2 0.0 - 0.4 x10E3/uL   Basophils Absolute 0.0 0.0 - 0.2 x10E3/uL   Immature Granulocytes 0 %   Immature Grans (Abs) 0.0 0.0 - 0.1 B09G2/EZ  Basic Metabolic Panel (BMET)  Result Value Ref Range   Glucose 83 65 - 99 mg/dL   BUN 7 (L) 8 - 27 mg/dL   Creatinine, Ser 0.69 0.57 - 1.00 mg/dL   GFR calc non Af Amer 94 >59 mL/min/1.73   GFR calc Af Amer 109 >59 mL/min/1.73   BUN/Creatinine Ratio 10 (L) 11 - 26   Sodium 141 134 - 144 mmol/L   Potassium 3.1 (L) 3.5 - 5.2 mmol/L   Chloride 95 (L) 97 - 108 mmol/L   CO2 29 18 - 29 mmol/L   Calcium 9.3 8.7 - 10.3 mg/dL      Assessment & Plan:   Problem List Items Addressed This Visit      Cardiovascular and Mediastinum   Hypertension - Primary    Not quite to goal; she did not tolerate ACE-I; will start low dose CCB instead; avoid salt and decongestants         Other   Hyperuricemia    Limit purine-rich foods; check uric acid today      Encounter for monitoring coumadin therapy    Monitored by Dr. Lucky Cowboy      Hypokalemia    Might be due to HCTZ; check labs today and replace if needed         Follow up plan: No Follow-up on file. An after-visit summary was printed and given to the patient at [place].  Please see the patient instructions which may contain other information and recommendations beyond what is mentioned above in the assessment and plan.  Orders Placed This Encounter  Procedures  . Basic Metabolic Panel (BMET)  . Uric acid   Meds ordered this encounter  Medications  . amLODipine (NORVASC) 2.5 MG tablet    Sig: Take 1 tablet (2.5 mg total) by mouth daily.    Dispense:  30 tablet    Refill:  6

## 2015-04-16 NOTE — Patient Instructions (Addendum)
You can try some ice on the spot on your right leg STOP HCTZ for blood pressure Start amlodipine Check blood pressure about once or twice a week for a few weeks and call Amy with those numbers We'll let you know about the lab results

## 2015-04-17 ENCOUNTER — Telehealth: Payer: Self-pay | Admitting: Family Medicine

## 2015-04-17 LAB — BASIC METABOLIC PANEL
BUN/Creatinine Ratio: 14 (ref 11–26)
BUN: 8 mg/dL (ref 8–27)
CO2: 28 mmol/L (ref 18–29)
Calcium: 9.7 mg/dL (ref 8.7–10.3)
Chloride: 99 mmol/L (ref 97–108)
Creatinine, Ser: 0.59 mg/dL (ref 0.57–1.00)
GFR calc Af Amer: 114 mL/min/{1.73_m2} (ref >59)
GFR calc non Af Amer: 99 mL/min/{1.73_m2} (ref >59)
Glucose: 86 mg/dL (ref 65–99)
Potassium: 3.1 mmol/L — ABNORMAL LOW (ref 3.5–5.2)
Sodium: 145 mmol/L — ABNORMAL HIGH (ref 134–144)

## 2015-04-17 LAB — URIC ACID: Uric Acid: 9.2 mg/dL — ABNORMAL HIGH (ref 2.5–7.1)

## 2015-04-17 MED ORDER — POTASSIUM CHLORIDE CRYS ER 20 MEQ PO TBCR: 20.0000 meq | EXTENDED_RELEASE_TABLET | Freq: Every day | ORAL | Status: DC

## 2015-04-17 NOTE — Telephone Encounter (Signed)
Left message to call.

## 2015-04-17 NOTE — Telephone Encounter (Signed)
Potassium is low She really needs to stop that HCTZ, which can cause gout flares and lower potassium Take Rx potassium for five days to build that up (Rx sent) We talked about starting some medicine to lower her uric acid; however, it can interfere with her warfarin (Coumadin) so let's see if she can keep from having another gout flare just by avoiding the HCTZ and staying away from trigger foods

## 2015-04-17 NOTE — Telephone Encounter (Signed)
Patient notified

## 2015-08-27 DIAGNOSIS — E785 Hyperlipidemia, unspecified: Secondary | ICD-10-CM | POA: Diagnosis not present

## 2015-08-27 DIAGNOSIS — I10 Essential (primary) hypertension: Secondary | ICD-10-CM | POA: Diagnosis not present

## 2015-08-27 DIAGNOSIS — I739 Peripheral vascular disease, unspecified: Secondary | ICD-10-CM | POA: Diagnosis not present

## 2015-08-27 DIAGNOSIS — I70211 Atherosclerosis of native arteries of extremities with intermittent claudication, right leg: Secondary | ICD-10-CM | POA: Diagnosis not present

## 2015-10-17 ENCOUNTER — Ambulatory Visit (INDEPENDENT_AMBULATORY_CARE_PROVIDER_SITE_OTHER): Payer: Medicare Other | Admitting: Family Medicine

## 2015-10-17 ENCOUNTER — Encounter: Payer: Self-pay | Admitting: Family Medicine

## 2015-10-17 VITALS — BP 144/88 | HR 89 | Temp 98.5°F | Ht 65.7 in | Wt 135.4 lb

## 2015-10-17 DIAGNOSIS — Z720 Tobacco use: Secondary | ICD-10-CM

## 2015-10-17 DIAGNOSIS — D72820 Lymphocytosis (symptomatic): Secondary | ICD-10-CM

## 2015-10-17 DIAGNOSIS — Z1239 Encounter for other screening for malignant neoplasm of breast: Secondary | ICD-10-CM | POA: Diagnosis not present

## 2015-10-17 DIAGNOSIS — I10 Essential (primary) hypertension: Secondary | ICD-10-CM | POA: Diagnosis not present

## 2015-10-17 DIAGNOSIS — E785 Hyperlipidemia, unspecified: Secondary | ICD-10-CM

## 2015-10-17 DIAGNOSIS — Z5181 Encounter for therapeutic drug level monitoring: Secondary | ICD-10-CM

## 2015-10-17 DIAGNOSIS — E790 Hyperuricemia without signs of inflammatory arthritis and tophaceous disease: Secondary | ICD-10-CM

## 2015-10-17 DIAGNOSIS — I739 Peripheral vascular disease, unspecified: Secondary | ICD-10-CM | POA: Diagnosis not present

## 2015-10-17 NOTE — Progress Notes (Signed)
BP 144/88 mmHg  Pulse 89  Temp(Src) 98.5 F (36.9 C)  Ht 5' 5.7" (1.669 m)  Wt 135 lb 6.4 oz (61.417 kg)  BMI 22.05 kg/m2  SpO2 94%   Subjective:    Patient ID: Mary [name], female    DOB: 07-Jun-1951, 64 y.o.   MRN: [id number]  HPI: Mary [name] is a 65 y.o. female  Chief Complaint  Patient presents with  . Hypertension  . Hyperlipidemia   Here for just regular follow-up  She has high blood pressure; only has one BP refill for March and will call next month when new Rx is needed Slight headache upon awakening this morning, just this once and it's gone now; not usually having headaches She checks BP at the pharmacy sometimes; 130/82 or something like that Uses a little bit of salt; gets fries without salt  High cholesterol; does eat bologna and bacon every now and then; no more than 3 eggs per week; maybe just in tuna salad; does not drink milk  She is on coumadin managed by Dr. Lucky Cowboy; no easy bruising or bleeding on that; she alternates 1/2 and 1 pill every other day  She has tramadol on her med list  High uric acid last labs; no gout flares; gout does run in the family; ran through list of foods to limit; no alcohol  High lymphocytes; smokes 1/3 ppd; smoking for a long time; she quit once before; she can do it if she puts her mind to it; she will not need medicine for quitting she says  HIV and hep C screening offered, politely declined by patient today  Relevant past medical, surgical, family and social history reviewed and updated as indicated. Interim medical history since our last visit reviewed. Allergies and medications reviewed and updated.  Review of Systems Per HPI unless specifically indicated above     Objective:    BP 144/88 mmHg  Pulse 89  Temp(Src) 98.5 F (36.9 C)  Ht 5' 5.7" (1.669 m)  Wt 135 lb 6.4 oz (61.417 kg)  BMI 22.05 kg/m2  SpO2 94%  Wt Readings from Last 3 Encounters:  10/17/15 135 lb 6.4 oz (61.417 kg)  04/16/15 129 lb (58.514 kg)   02/27/15 132 lb (59.875 kg)   recheck 144/88 Today's Vitals   10/17/15 0925 10/17/15 1010  BP: 165/81 144/88  Pulse: 89   Temp: 98.5 F (36.9 C)   Height: 5' 5.7" (1.669 m)   Weight: 135 lb 6.4 oz (61.417 kg)   SpO2: 94%     Physical Exam  Constitutional: She appears well-developed and well-nourished. No distress.  HENT:  Head: Normocephalic and atraumatic.  Eyes: EOM are normal. No scleral icterus.  Neck: No thyromegaly present.  Cardiovascular: Normal rate, regular rhythm and normal heart sounds.   No murmur heard. Pulmonary/Chest: Effort normal and breath sounds normal. No respiratory distress. She has no wheezes.  Abdominal: Soft. Bowel sounds are normal. She exhibits no distension.  Musculoskeletal: Normal range of motion. She exhibits no edema.  Neurological: She is alert. She exhibits normal muscle tone.  Skin: Skin is warm and dry. She is not diaphoretic. No pallor.  Psychiatric: She has a normal mood and affect. Her behavior is normal. Judgment and thought content normal.      Assessment & Plan:   Problem List Items Addressed This Visit      Cardiovascular and Mediastinum   PAD (peripheral artery disease) ([place])    Managed by Dr. Lucky Cowboy; on coumadin; encouraged  her to quit smoking      Hypertension - Primary    Recheck today; DASH guidelines; continue CCB; smoking cessation would help too        Other   Hyperlipidemia    Check lipid panel; limit saturated fats      Relevant Orders   Lipid Panel w/o Chol/HDL Ratio (Completed)   Tobacco abuse    Encouraged pt to quit smoking; she says she can quit when she puts her mind to it; she declined offer for medicine to help quit; discussed risk of leukemia in the future if she continues to smoke      Hyperuricemia    Recheck uric acid; avoid purine-rich foods      Relevant Orders   Uric acid (Completed)   Breast cancer screening   Relevant Orders   MM DIGITAL SCREENING BILATERAL   Medication monitoring  encounter   Relevant Orders   Comprehensive metabolic panel (Completed)   Lymphocytosis    Pointed out last mildly elevated lymphocyte count; explained that smoking can increase her risk of lymphocytic leukemia, encouraged her to quit; recheck      Relevant Orders   CBC with Differential/Platelet (Completed)      Follow up plan: Return in about 6 months (around 04/15/2016) for thirty minute follow-up with fasting labs; 1 month for CPE.  Orders Placed This Encounter  Procedures  . MM DIGITAL SCREENING BILATERAL  . CBC with Differential/Platelet  . Lipid Panel w/o Chol/HDL Ratio  . Comprehensive metabolic panel  . Uric acid   An after-visit summary was printed and given to the patient at [place].  Please see the patient instructions which may contain other information and recommendations beyond what is mentioned above in the assessment and plan.

## 2015-10-17 NOTE — Assessment & Plan Note (Addendum)
Managed by Dr. Lucky Cowboy; on coumadin; encouraged her to quit smoking

## 2015-10-17 NOTE — Assessment & Plan Note (Addendum)
Encouraged pt to quit smoking; she says she can quit when she puts her mind to it; she declined offer for medicine to help quit; discussed risk of leukemia in the future if she continues to smoke

## 2015-10-17 NOTE — Assessment & Plan Note (Addendum)
Recheck today; DASH guidelines; continue CCB; smoking cessation would help too

## 2015-10-17 NOTE — Patient Instructions (Addendum)
Try to eat a diet low in purine-rich foods to avoid gout  Your goal blood pressure is less than 150 mmHg on top at least and under 140 is even better Try to follow the DASH guidelines (DASH stands for Dietary Approaches to Stop Hypertension) Try to limit the sodium in your diet.  Ideally, consume less than 1.5 grams (less than 1,500mg ) per day. Do not add salt when cooking or at the table.  Check the sodium amount on labels when shopping, and choose items lower in sodium when given a choice. Avoid or limit foods that already contain a lot of sodium. Eat a diet rich in fruits and vegetables and whole grains. Check your blood pressure a few times a month and call me if not controlled.  I do encourage you to quit smoking Call [phone] to sign up for smoking cessation classes You can call 1-800-QUIT-NOW to talk with a smoking cessation coach  Try to limit saturated fats in your diet (bologna, hot dogs, barbeque, cheeseburgers, hamburgers, steak, bacon, sausage, cheese, etc.) and get more fresh fruits, vegetables, and whole grains  Please do call to schedule your mammogram; the number to schedule one at either [facility] or [facility] is [phone]    Smoking Cessation, Tips for Success If you are ready to quit smoking, congratulations! You have chosen to help yourself be healthier. Cigarettes bring nicotine, tar, carbon monoxide, and other irritants into your body. Your lungs, heart, and blood vessels will be able to work better without these poisons. There are many different ways to quit smoking. Nicotine gum, nicotine patches, a nicotine inhaler, or nicotine nasal spray can help with physical craving. Hypnosis, support groups, and medicines help break the habit of smoking. WHAT THINGS CAN I DO TO MAKE QUITTING EASIER?  Here are some tips to help you quit for good:  Pick a date when you will quit smoking completely. Tell all of your friends and family about  your plan to quit on that date.  Do not try to slowly cut down on the number of cigarettes you are smoking. Pick a quit date and quit smoking completely starting on that day.  Throw away all cigarettes.   Clean and remove all ashtrays from your home, work, and car.  On a card, write down your reasons for quitting. Carry the card with you and read it when you get the urge to smoke.  Cleanse your body of nicotine. Drink enough water and fluids to keep your urine clear or pale yellow. Do this after quitting to flush the nicotine from your body.  Learn to predict your moods. Do not let a bad situation be your excuse to have a cigarette. Some situations in your life might tempt you into wanting a cigarette.  Never have "just one" cigarette. It leads to wanting another and another. Remind yourself of your decision to quit.  Change habits associated with smoking. If you smoked while driving or when feeling stressed, try other activities to replace smoking. Stand up when drinking your coffee. Brush your teeth after eating. Sit in a different chair when you read the paper. Avoid alcohol while trying to quit, and try to drink fewer caffeinated beverages. Alcohol and caffeine may urge you to smoke.  Avoid foods and drinks that can trigger a desire to smoke, such as sugary or spicy foods and alcohol.  Ask people who smoke not to smoke around you.  Have something planned to do right after eating  or having a cup of coffee. For example, plan to take a walk or exercise.  Try a relaxation exercise to calm you down and decrease your stress. Remember, you may be tense and nervous for the first 2 weeks after you quit, but this will pass.  Find new activities to keep your hands busy. Play with a pen, coin, or rubber band. Doodle or draw things on paper.  Brush your teeth right after eating. This will help cut down on the craving for the taste of tobacco after meals. You can also try mouthwash.   Use oral  substitutes in place of cigarettes. Try using lemon drops, carrots, cinnamon sticks, or chewing gum. Keep them handy so they are available when you have the urge to smoke.  When you have the urge to smoke, try deep breathing.  Designate your home as a nonsmoking area.  If you are a heavy smoker, ask your health care provider about a prescription for nicotine chewing gum. It can ease your withdrawal from nicotine.  Reward yourself. Set aside the cigarette money you save and buy yourself something nice.  Look for support from others. Join a support group or smoking cessation program. Ask someone at home or at work to help you with your plan to quit smoking.  Always ask yourself, "Do I need this cigarette or is this just a reflex?" Tell yourself, "Today, I choose not to smoke," or "I do not want to smoke." You are reminding yourself of your decision to quit.  Do not replace cigarette smoking with electronic cigarettes (commonly called e-cigarettes). The safety of e-cigarettes is unknown, and some may contain harmful chemicals.  If you relapse, do not give up! Plan ahead and think about what you will do the next time you get the urge to smoke. HOW WILL I FEEL WHEN I QUIT SMOKING? You may have symptoms of withdrawal because your body is used to nicotine (the addictive substance in cigarettes). You may crave cigarettes, be irritable, feel very hungry, cough often, get headaches, or have difficulty concentrating. The withdrawal symptoms are only temporary. They are strongest when you first quit but will go away within 10-14 days. When withdrawal symptoms occur, stay in control. Think about your reasons for quitting. Remind yourself that these are signs that your body is healing and getting used to being without cigarettes. Remember that withdrawal symptoms are easier to treat than the major diseases that smoking can cause.  Even after the withdrawal is over, expect periodic urges to smoke. However, these  cravings are generally short lived and will go away whether you smoke or not. Do not smoke! WHAT RESOURCES ARE AVAILABLE TO HELP ME QUIT SMOKING? Your health care provider can direct you to community resources or hospitals for support, which may include:  Group support.  Education.  Hypnosis.  Therapy.   This information is not intended to replace advice given to you by your health care provider. Make sure you discuss any questions you have with your health care provider.   Document Released: 05/15/2004 Document Revised: 09/07/2014 Document Reviewed: 02/02/2013 Elsevier Interactive Patient Education 2016 [place] DASH stands for "Dietary Approaches to Stop Hypertension." The DASH eating plan is a healthy eating plan that has been shown to reduce high blood pressure (hypertension). Additional health benefits may include reducing the risk of type 2 diabetes mellitus, heart disease, and stroke. The DASH eating plan may also help with weight loss. WHAT DO I NEED TO KNOW ABOUT  THE DASH EATING PLAN? For the DASH eating plan, you will follow these general guidelines:  Choose foods with a percent daily value for sodium of less than 5% (as listed on the food label).  Use salt-free seasonings or herbs instead of table salt or sea salt.  Check with your health care provider or pharmacist before using salt substitutes.  Eat lower-sodium products, often labeled as "lower sodium" or "no salt added."  Eat fresh foods.  Eat more vegetables, fruits, and low-fat dairy products.  Choose whole grains. Look for the word "whole" as the first word in the ingredient list.  Choose fish and skinless chicken or [place] more often than red meat. Limit fish, poultry, and meat to 6 oz (170 g) each day.  Limit sweets, desserts, sugars, and sugary drinks.  Choose heart-healthy fats.  Limit cheese to 1 oz (28 g) per day.  Eat more home-cooked food and less restaurant, buffet, and fast  food.  Limit fried foods.  Cook foods using methods other than frying.  Limit canned vegetables. If you do use them, rinse them well to decrease the sodium.  When eating at a restaurant, ask that your food be prepared with less salt, or no salt if possible. WHAT FOODS CAN I EAT? Seek help from a dietitian for individual calorie needs. Grains Whole grain or whole wheat bread. Brown rice. Whole grain or whole wheat pasta. Quinoa, bulgur, and whole grain cereals. Low-sodium cereals. Corn or whole wheat flour tortillas. Whole grain cornbread. Whole grain crackers. Low-sodium crackers. Vegetables Fresh or frozen vegetables (raw, steamed, roasted, or grilled). Low-sodium or reduced-sodium tomato and vegetable juices. Low-sodium or reduced-sodium tomato sauce and paste. Low-sodium or reduced-sodium canned vegetables.  Fruits All fresh, canned (in natural juice), or frozen fruits. Meat and Other Protein Products Ground beef (85% or leaner), grass-fed beef, or beef trimmed of fat. Skinless chicken or [place]. Ground chicken or [place]. Pork trimmed of fat. All fish and seafood. Eggs. Dried beans, peas, or lentils. Unsalted nuts and seeds. Unsalted canned beans. Dairy Low-fat dairy products, such as skim or 1% milk, 2% or reduced-fat cheeses, low-fat ricotta or cottage cheese, or plain low-fat yogurt. Low-sodium or reduced-sodium cheeses. Fats and Oils Tub margarines without trans fats. Light or reduced-fat mayonnaise and salad dressings (reduced sodium). Avocado. Safflower, olive, or canola oils. Natural peanut or almond butter. Other Unsalted popcorn and pretzels. The items listed above may not be a complete list of recommended foods or beverages. Contact your dietitian for more options. WHAT FOODS ARE NOT RECOMMENDED? Grains White bread. White pasta. White rice. Refined cornbread. Bagels and croissants. Crackers that contain trans fat. Vegetables Creamed or fried vegetables. Vegetables in a  cheese sauce. Regular canned vegetables. Regular canned tomato sauce and paste. Regular tomato and vegetable juices. Fruits Dried fruits. Canned fruit in light or heavy syrup. Fruit juice. Meat and Other Protein Products Fatty cuts of meat. Ribs, chicken wings, bacon, sausage, bologna, salami, chitterlings, fatback, hot dogs, bratwurst, and packaged luncheon meats. Salted nuts and seeds. Canned beans with salt. Dairy Whole or 2% milk, cream, half-and-half, and cream cheese. Whole-fat or sweetened yogurt. Full-fat cheeses or blue cheese. Nondairy creamers and whipped toppings. Processed cheese, cheese spreads, or cheese curds. Condiments Onion and garlic salt, seasoned salt, table salt, and sea salt. Canned and packaged gravies. Worcestershire sauce. Tartar sauce. Barbecue sauce. Teriyaki sauce. Soy sauce, including reduced sodium. Steak sauce. Fish sauce. Oyster sauce. Cocktail sauce. Horseradish. Ketchup and mustard. Meat flavorings and tenderizers. Bouillon cubes.  Hot sauce. Tabasco sauce. Marinades. Taco seasonings. Relishes. Fats and Oils Butter, stick margarine, lard, shortening, ghee, and bacon fat. Coconut, palm kernel, or palm oils. Regular salad dressings. Other Pickles and olives. Salted popcorn and pretzels. The items listed above may not be a complete list of foods and beverages to avoid. Contact your dietitian for more information. WHERE CAN I FIND MORE INFORMATION? National Heart, Lung, and Blood Institute: travelstabloid.com   This information is not intended to replace advice given to you by your health care provider. Make sure you discuss any questions you have with your health care provider.   Document Released: 08/06/2011 Document Revised: 09/07/2014 Document Reviewed: 06/21/2013 Elsevier Interactive Patient Education [employer].

## 2015-10-17 NOTE — Assessment & Plan Note (Signed)
Recheck uric acid; avoid purine-rich foods

## 2015-10-17 NOTE — Assessment & Plan Note (Signed)
Check lipid panel; limit saturated fats 

## 2015-10-18 LAB — COMPREHENSIVE METABOLIC PANEL
ALT: 16 IU/L (ref 0–32)
AST: 20 IU/L (ref 0–40)
Albumin/Globulin Ratio: 1.4 (ref 1.1–2.5)
Albumin: 4.1 g/dL (ref 3.6–4.8)
Alkaline Phosphatase: 93 IU/L (ref 39–117)
BUN/Creatinine Ratio: 9 — ABNORMAL LOW (ref 11–26)
BUN: 6 mg/dL — ABNORMAL LOW (ref 8–27)
Bilirubin Total: 0.3 mg/dL (ref 0.0–1.2)
CO2: 25 mmol/L (ref 18–29)
Calcium: 9 mg/dL (ref 8.7–10.3)
Chloride: 98 mmol/L (ref 96–106)
Creatinine, Ser: 0.7 mg/dL (ref 0.57–1.00)
GFR calc Af Amer: 107 mL/min/{1.73_m2} (ref >59)
GFR calc non Af Amer: 93 mL/min/{1.73_m2} (ref >59)
Globulin, Total: 2.9 g/dL (ref 1.5–4.5)
Glucose: 73 mg/dL (ref 65–99)
Potassium: 3.4 mmol/L — ABNORMAL LOW (ref 3.5–5.2)
Sodium: 142 mmol/L (ref 134–144)
Total Protein: 7 g/dL (ref 6.0–8.5)

## 2015-10-18 LAB — LIPID PANEL W/O CHOL/HDL RATIO
Cholesterol, Total: 145 mg/dL (ref 100–199)
HDL: 43 mg/dL (ref >39)
LDL Calculated: 66 mg/dL (ref 0–99)
Triglycerides: 182 mg/dL — ABNORMAL HIGH (ref 0–149)
VLDL Cholesterol Cal: 36 mg/dL (ref 5–40)

## 2015-10-18 LAB — CBC WITH DIFFERENTIAL/PLATELET
Basophils Absolute: 0 10*3/uL (ref 0.0–0.2)
Basos: 0 %
EOS (ABSOLUTE): 0.2 10*3/uL (ref 0.0–0.4)
Eos: 3 %
Hematocrit: 39.3 % (ref 34.0–46.6)
Hemoglobin: 13.8 g/dL (ref 11.1–15.9)
Immature Grans (Abs): 0 10*3/uL (ref 0.0–0.1)
Immature Granulocytes: 0 %
Lymphocytes Absolute: 3.7 10*3/uL — ABNORMAL HIGH (ref 0.7–3.1)
Lymphs: 54 %
MCH: 29.7 pg (ref 26.6–33.0)
MCHC: 35.1 g/dL (ref 31.5–35.7)
MCV: 85 fL (ref 79–97)
Monocytes Absolute: 0.6 10*3/uL (ref 0.1–0.9)
Monocytes: 9 %
Neutrophils Absolute: 2.3 10*3/uL (ref 1.4–7.0)
Neutrophils: 34 %
Platelets: 315 10*3/uL (ref 150–379)
RBC: 4.64 x10E6/uL (ref 3.77–5.28)
RDW: 14.1 % (ref 12.3–15.4)
WBC: 6.9 10*3/uL (ref 3.4–10.8)

## 2015-10-18 LAB — URIC ACID: Uric Acid: 6.6 mg/dL (ref 2.5–7.1)

## 2015-10-19 ENCOUNTER — Telehealth: Payer: Self-pay | Admitting: Family Medicine

## 2015-10-19 NOTE — Telephone Encounter (Signed)
I left detailed message; calling about labs; do NOT be alarmed that I'm calling on a Saturday, just contacting folks about lab results from this week Take one extra potassium pill daily for the next three days I'll try her again Sunday or Monday

## 2015-10-21 MED ORDER — POTASSIUM CHLORIDE ER 10 MEQ PO TBCR: EXTENDED_RELEASE_TABLET | ORAL | Status: DC

## 2015-10-21 NOTE — Telephone Encounter (Signed)
I talked with patient about labs; she was NOT taking potassium at the time this was drawn; she has had low K+ levels, so I'll put her on 10 mEq to take three or four days a week chronically; other labs discussed

## 2015-10-26 NOTE — Assessment & Plan Note (Signed)
Pointed out last mildly elevated lymphocyte count; explained that smoking can increase her risk of lymphocytic leukemia, encouraged her to quit; recheck

## 2015-11-14 ENCOUNTER — Ambulatory Visit (INDEPENDENT_AMBULATORY_CARE_PROVIDER_SITE_OTHER): Payer: Medicare Other | Admitting: Family Medicine

## 2015-11-14 ENCOUNTER — Encounter: Payer: Self-pay | Admitting: Family Medicine

## 2015-11-14 VITALS — BP 155/93 | HR 85 | Temp 97.3°F | Ht 65.5 in | Wt 137.0 lb

## 2015-11-14 DIAGNOSIS — Z1211 Encounter for screening for malignant neoplasm of colon: Secondary | ICD-10-CM

## 2015-11-14 DIAGNOSIS — I10 Essential (primary) hypertension: Secondary | ICD-10-CM | POA: Diagnosis not present

## 2015-11-14 DIAGNOSIS — Z720 Tobacco use: Secondary | ICD-10-CM

## 2015-11-14 DIAGNOSIS — N6313 Unspecified lump in the right breast, lower outer quadrant: Secondary | ICD-10-CM

## 2015-11-14 DIAGNOSIS — Z0000 Encounter for general adult medical examination without abnormal findings: Secondary | ICD-10-CM | POA: Diagnosis not present

## 2015-11-14 DIAGNOSIS — Z1231 Encounter for screening mammogram for malignant neoplasm of breast: Secondary | ICD-10-CM | POA: Insufficient documentation

## 2015-11-14 DIAGNOSIS — Z1239 Encounter for other screening for malignant neoplasm of breast: Secondary | ICD-10-CM

## 2015-11-14 DIAGNOSIS — N63 Unspecified lump in breast: Secondary | ICD-10-CM

## 2015-11-14 DIAGNOSIS — D485 Neoplasm of uncertain behavior of skin: Secondary | ICD-10-CM

## 2015-11-14 MED ORDER — AMLODIPINE BESYLATE 5 MG PO TABS: 5.0000 mg | ORAL_TABLET | Freq: Every day | ORAL | Status: DC

## 2015-11-14 NOTE — Assessment & Plan Note (Signed)
Initial screening mammo cancelled in favor of diagnostic imaging

## 2015-11-14 NOTE — Assessment & Plan Note (Signed)
Patient declines colonoscopy; agrees to [employer]

## 2015-11-14 NOTE — Assessment & Plan Note (Signed)
USPSTF grade A and B recommendations reviewed with patient; age-appropriate recommendations, preventive care, screening tests, etc discussed and encouraged; healthy living encouraged; see AVS for patient education given to patient  

## 2015-11-14 NOTE — Progress Notes (Signed)
Patient ID: [name], female   DOB: June 24, 1951, 65 y.o.   MRN: [id number]   Subjective:   Mary [name] is a 65 y.o. female here for a complete physical exam  Interim issues since last visit: no medical excitement; no other doctor visits  USPSTF grade A and B recommendations Alcohol: no Depression: Depression screen [facility] 2/9 11/14/2015  Decreased Interest 0  Down, Depressed, Hopeless 0  PHQ - 2 Score 0    Hypertension: checks BP away from the doctor; 143/82 last week Obesity: n/a Tobacco use: smokes 6-7 cigarettes a day; way down; thinking about quitting completely in the future; does not need help from me (offered0 HIV, hep B, hep C: politely declined all testing STD testing and prevention (chl/gon/syphilis): asx Lipids: just done in Feb, reviewed Glucose: 73 in Feb Colorectal cancer: order cologuard Breast cancer: order mammo, patient will call to schedule; no lumps palpated BRCA gene screening: no fam hx Intimate partner violence: no Cervical cancer screening: UTD Lung cancer: discussed and patient declines right now Osteoporosis: pt opts to wait until age 62 Fall prevention/vitamin D: discussed, take 1000 iu vit D AAA: n/a Aspirin: no aspirin, on coumadin Diet: tries to get fruits and veggies Exercise: pretty active, works out in the yard; walks puppy, Engineer, mining Skin cancer: no cancer, but large moles on left shoulder near neck and glabella  Past Medical History  Diagnosis Date  . Osteoporosis   . PAD (peripheral artery disease) ([place]) 02/2010    angiplasty, popliteal artery  . Hypertension   . Hyperlipidemia   . Tobacco abuse   . Hyperuricemia    Past Surgical History  Procedure Laterality Date  . Angioplasty  July 2011    PAD- popliteal artery  . Popliteal artery stent Right 05/2010  . Popliteal artery stent Right 07/2010  . Leg bypass surgery Right 08/2010  . Ovarian cyst surgery  1980s   Family History  Problem Relation Age of Onset  . Arthritis Mother    . Diabetes Mother   . Stroke Mother   . Hypertension Mother   . Cancer Father   . Arthritis Sister   . Cancer Sister   . Seizures Sister   . Diabetes Sister   . Hypertension Sister   . COPD Sister   . Cancer Brother   . Diabetes Brother   . Heart disease Neg Hx    Social History  Substance Use Topics  . Smoking status: Current Every Day Smoker -- 0.25 packs/day    Types: Cigarettes  . Smokeless tobacco: Never Used  . Alcohol Use: No   Review of Systems  Constitutional: Negative for fever, chills and unexpected weight change.  HENT: Negative for hearing loss.   Eyes: Negative for visual disturbance.  Respiratory: Negative for wheezing.   Cardiovascular: Negative for chest pain.  Gastrointestinal: Negative for blood in stool.  Endocrine: Negative for polydipsia and polyuria.  Genitourinary: Negative for hematuria.  Musculoskeletal:       Soreness at surgery site right leg, nothing new; since 2012  Skin:       Place on glabella and left side neck/shoulder  Allergic/Immunologic: Negative for food allergies.  Neurological: Negative for tremors.  Hematological: Does not bruise/bleed easily.  Psychiatric/Behavioral: Negative for dysphoric mood.    Objective:   Filed Vitals:   11/14/15 0955  BP: 155/93  Pulse: 85  Temp: 97.3 F (36.3 C)  Height: 5' 5.5" (1.664 m)  Weight: 137 lb (62.143 kg)  SpO2: 97%  Body mass index is 22.44 kg/(m^2). Wt Readings from Last 3 Encounters:  11/14/15 137 lb (62.143 kg)  10/17/15 135 lb 6.4 oz (61.417 kg)  04/16/15 129 lb (58.514 kg)   Physical Exam  Constitutional: She appears well-developed and well-nourished.  HENT:  Head: Normocephalic and atraumatic.  Right Ear: Hearing, tympanic membrane, external ear and ear canal normal.  Left Ear: Hearing, tympanic membrane, external ear and ear canal normal.  Eyes: Conjunctivae and EOM are normal. Right eye exhibits no hordeolum. Left eye exhibits no hordeolum. No scleral icterus.   Neck: Carotid bruit is not present. No thyromegaly present.  Cardiovascular: Normal rate, regular rhythm, S1 normal, S2 normal and normal heart sounds.   No extrasystoles are present.  Pulmonary/Chest: Effort normal and breath sounds normal. No respiratory distress. Right breast exhibits no inverted nipple, no mass (palpable mass RIGHT breast at 7 o'clock position, several cm from areola), no nipple discharge, no skin change and no tenderness. Left breast exhibits no inverted nipple, no mass, no nipple discharge, no skin change and no tenderness. Breasts are symmetrical.  Abdominal: Soft. Normal appearance and bowel sounds are normal. She exhibits no distension, no abdominal bruit, no pulsatile midline mass and no mass. There is no hepatosplenomegaly. There is no tenderness. No hernia.  Musculoskeletal: Normal range of motion. She exhibits no edema.  Lymphadenopathy:       Head (right side): No submandibular adenopathy present.       Head (left side): No submandibular adenopathy present.    She has no cervical adenopathy.    She has no axillary adenopathy.  Neurological: She is alert. She displays no tremor. No cranial nerve deficit. She exhibits normal muscle tone. Gait normal.  Reflex Scores:      Patellar reflexes are 2+ on the right side and 2+ on the left side. Skin: Skin is warm and dry. Lesion (hyperpigmented conical lesion right of midline glabella; nodular hyperpigmented lesion proximal left shoulder / left side lower neck) noted. No bruising and no ecchymosis noted. No cyanosis. No pallor.  Psychiatric: Her speech is normal and behavior is normal. Thought content normal. Her mood appears not anxious. She does not exhibit a depressed mood.    Assessment/Plan:   Problem List Items Addressed This Visit      Cardiovascular and Mediastinum   Hypertension    Not to goal; increase amlodipine from 2.5 mg daily to 5 mg daily; return in 4 weeks for follow-up; she'll try to cut down on  smoking; try DASH guidelines      Relevant Medications   amLODipine (NORVASC) 5 MG tablet     Musculoskeletal and Integument   Neoplasm of uncertain behavior of skin    Refer to derm for eval and treatment of moles on glabella and left side neck/shoulder      Relevant Orders   Ambulatory referral to Dermatology     Other   Tobacco abuse    Encouraged pt to quit smoking; I am here to help if/when needed; see AVS      Breast cancer screening    Initial screening mammo cancelled in favor of diagnostic imaging      Colon cancer screening    Patient declines colonoscopy; agrees to Cologuard      Relevant Orders   Cologuard   Breast lump on right side at 7 o'clock position    Discovered on CBE; encouraged monthly SBE; order diagnostic imaging      Relevant Orders   MM DIAG BREAST  TOMO BILATERAL   US BREAST COMPLETE UNI RIGHT INC AXILLA   US BREAST COMPLETE UNI LEFT INC AXILLA   Preventative health care - Primary    USPSTF grade A and B recommendations reviewed with patient; age-appropriate recommendations, preventive care, screening tests, etc discussed and encouraged; healthy living encouraged; see AVS for patient education given to patient      RESOLVED: Visit for screening mammogram       Meds ordered this encounter  Medications  . amLODipine (NORVASC) 5 MG tablet    Sig: Take 1 tablet (5 mg total) by mouth daily.    Dispense:  30 tablet    Refill:  5    Cancel the old 2.5 mg refills; we're increasing dose to 5 mg now; thanks!   Orders Placed This Encounter  Procedures  . MM DIAG BREAST TOMO BILATERAL    Order Specific Question:  Reason for Exam (SYMPTOM  OR DIAGNOSIS REQUIRED)    Answer:  breast lump 7 o'clock position RIGHT breast    Order Specific Question:  Preferred imaging location?    Answer:  [facility]  . US BREAST COMPLETE UNI RIGHT INC AXILLA    Order Specific Question:  Reason for Exam (SYMPTOM  OR DIAGNOSIS REQUIRED)    Answer:  breast  lump 7 o'clock position RIGHT breast    Order Specific Question:  Preferred imaging location?    Answer:  [facility]  . US BREAST COMPLETE UNI LEFT INC AXILLA    Scheduling Instructions:     Do the LEFT breast US ONLY IF NEEDED    Order Specific Question:  Reason for Exam (SYMPTOM  OR DIAGNOSIS REQUIRED)    Answer:  breast lump 7 o'clock position RIGHT breast    Order Specific Question:  Preferred imaging location?    Answer:  [facility]  . Cologuard  . Ambulatory referral to Dermatology    Referral Priority:  Routine    Referral Type:  Consultation    Referral Reason:  Specialty Services Required    Requested Specialty:  Dermatology    Number of Visits Requested:  1    Follow up plan: Return in about 1 year (around 11/13/2016) for complete physical; RTC 4 weeks for blood pressure f/u with Dr. Sanda Klein.  An after-visit summary was printed and given to the patient at [facility].  Please see the patient instructions which may contain other information and recommendations beyond what is mentioned above in the assessment and plan.  Meds ordered this encounter  Medications  . amLODipine (NORVASC) 5 MG tablet    Sig: Take 1 tablet (5 mg total) by mouth daily.    Dispense:  30 tablet    Refill:  5    Cancel the old 2.5 mg refills; we're increasing dose to 5 mg now; thanks!

## 2015-11-14 NOTE — Patient Instructions (Addendum)
Increase amlodipine from 2.5 mg daily to 5 mg daily Your goal blood pressure is less than 140 mmHg on top. Try to follow the DASH guidelines (DASH stands for Dietary Approaches to Stop Hypertension) Try to limit the sodium in your diet.  Ideally, consume less than 1.5 grams (less than 1,55m) per day. Do not add salt when cooking or at the table.  Check the sodium amount on labels when shopping, and choose items lower in sodium when given a choice. Avoid or limit foods that already contain a lot of sodium. Eat a diet rich in fruits and vegetables and whole grains. You should be receiving a kit in the mail for colon cancer screening. Call [place] you have not heard from the company in 2 weeks. Take vitamin D3 1000 iu daily I do encourage you to quit smoking Call [phone] sign up for smoking cessation classes You can call 1-800-QUIT-NOW to talk with a smoking cessation coach  Health Maintenance, Female Adopting a healthy lifestyle and getting preventive care can go a long way to promote health and wellness. Talk with your health care provider about what schedule of regular examinations is right for you. This is a good chance for you to check in with your provider about disease prevention and staying healthy. In between checkups, there are plenty of things you can do on your own. Experts have done a lot of research about which lifestyle changes and preventive measures are most likely to keep you healthy. Ask your health care provider for more information. WEIGHT AND DIET  Eat a healthy diet  Be sure to include plenty of vegetables, fruits, low-fat dairy products, and lean protein.  Do not eat a lot of foods high in solid fats, added sugars, or salt.  Get regular exercise. This is one of the most important things you can do for your health.  Most adults should exercise for at least 150 minutes each week. The exercise should increase your heart rate and make you sweat (moderate-intensity  exercise).  Most adults should also do strengthening exercises at least twice a week. This is in addition to the moderate-intensity exercise.  Maintain a healthy weight  Body mass index (BMI) is a measurement that can be used to identify possible weight problems. It estimates body fat based on height and weight. Your health care provider can help determine your BMI and help you achieve or maintain a healthy weight.  For females 229years of age and older:   A BMI below 18.5 is considered underweight.  A BMI of 18.5 to 24.9 is normal.  A BMI of 25 to 29.9 is considered overweight.  A BMI of 30 and above is considered obese.  Watch levels of cholesterol and blood lipids  You should start having your blood tested for lipids and cholesterol at 63years of age, then have this test every 5 years.  You may need to have your cholesterol levels checked more often if:  Your lipid or cholesterol levels are high.  You are older than 63years of age.  You are at high risk for heart disease.  CANCER SCREENING   Lung Cancer  Lung cancer screening is recommended for adults [id number] old who are at high risk for lung cancer because of a history of smoking.  A yearly low-dose CT scan of the lungs is recommended for people who:  Currently smoke.  Have quit within the past 15 years.  Have at least a 30-pack-year history  of smoking. A pack year is smoking an average of one pack of cigarettes a day for 1 year.  Yearly screening should continue until it has been 15 years since you quit.  Yearly screening should stop if you develop a health problem that would prevent you from having lung cancer treatment.  Breast Cancer  Practice breast self-awareness. This means understanding how your breasts normally appear and feel.  It also means doing regular breast self-exams. Let your health care provider know about any changes, no matter how small.  If you are in your 20s or 30s, you should  have a clinical breast exam (CBE) by a health care provider every 1-3 years as part of a regular health exam.  If you are 23 or older, have a CBE every year. Also consider having a breast X-ray (mammogram) every year.  If you have a family history of breast cancer, talk to your health care provider about genetic screening.  If you are at high risk for breast cancer, talk to your health care provider about having an MRI and a mammogram every year.  Breast cancer gene (BRCA) assessment is recommended for women who have family members with BRCA-related cancers. BRCA-related cancers include:  Breast.  Ovarian.  Tubal.  Peritoneal cancers.  Results of the assessment will determine the need for genetic counseling and BRCA1 and BRCA2 testing. Cervical Cancer Your health care provider may recommend that you be screened regularly for cancer of the pelvic organs (ovaries, uterus, and vagina). This screening involves a pelvic examination, including checking for microscopic changes to the surface of your cervix (Pap test). You may be encouraged to have this screening done every 3 years, beginning at age 38.  For women ages 31-65, health care providers may recommend pelvic exams and Pap testing every 3 years, or they may recommend the Pap and pelvic exam, combined with testing for human papilloma virus (HPV), every 5 years. Some types of HPV increase your risk of cervical cancer. Testing for HPV may also be done on women of any age with unclear Pap test results.  Other health care providers may not recommend any screening for nonpregnant women who are considered low risk for pelvic cancer and who do not have symptoms. Ask your health care provider if a screening pelvic exam is right for you.  If you have had past treatment for cervical cancer or a condition that could lead to cancer, you need Pap tests and screening for cancer for at least 20 years after your treatment. If Pap tests have been  discontinued, your risk factors (such as having a new sexual partner) need to be reassessed to determine if screening should resume. Some women have medical problems that increase the chance of getting cervical cancer. In these cases, your health care provider may recommend more frequent screening and Pap tests. Colorectal Cancer  This type of cancer can be detected and often prevented.  Routine colorectal cancer screening usually begins at 63 years of age and continues through 63 years of age.  Your health care provider may recommend screening at an earlier age if you have risk factors for colon cancer.  Your health care provider may also recommend using home test kits to check for hidden blood in the stool.  A small camera at the end of a tube can be used to examine your colon directly (sigmoidoscopy or colonoscopy). This is done to check for the earliest forms of colorectal cancer.  Routine screening usually begins at age  50.  Direct examination of the colon should be repeated every 5-10 years through 63 years of age. However, you may need to be screened more often if early forms of precancerous polyps or small growths are found. Skin Cancer  Check your skin from head to toe regularly.  Tell your health care provider about any new moles or changes in moles, especially if there is a change in a mole's shape or color.  Also tell your health care provider if you have a mole that is larger than the size of a pencil eraser.  Always use sunscreen. Apply sunscreen liberally and repeatedly throughout the day.  Protect yourself by wearing long sleeves, pants, a wide-brimmed hat, and sunglasses whenever you are outside. HEART DISEASE, DIABETES, AND HIGH BLOOD PRESSURE   High blood pressure causes heart disease and increases the risk of stroke. High blood pressure is more likely to develop in:  People who have blood pressure in the high end of the normal range (130-139/85-89 mm Hg).  People  who are overweight or obese.  People who are African American.  If you are 28-84 years of age, have your blood pressure checked every 3-5 years. If you are 52 years of age or older, have your blood pressure checked every year. You should have your blood pressure measured twice--once when you are at a hospital or clinic, and once when you are not at a hospital or clinic. Record the average of the two measurements. To check your blood pressure when you are not at a hospital or clinic, you can use:  An automated blood pressure machine at a pharmacy.  A home blood pressure monitor.  If you are between 83 years and 81 years old, ask your health care provider if you should take aspirin to prevent strokes.  Have regular diabetes screenings. This involves taking a blood sample to check your fasting blood sugar level.  If you are at a normal weight and have a low risk for diabetes, have this test once every three years after 63 years of age.  If you are overweight and have a high risk for diabetes, consider being tested at a younger age or more often. PREVENTING INFECTION  Hepatitis B  If you have a higher risk for hepatitis B, you should be screened for this virus. You are considered at high risk for hepatitis B if:  You were born in a country where hepatitis B is common. Ask your health care provider which countries are considered high risk.  Your parents were born in a high-risk country, and you have not been immunized against hepatitis B (hepatitis B vaccine).  You have HIV or AIDS.  You use needles to inject street drugs.  You live with someone who has hepatitis B.  You have had sex with someone who has hepatitis B.  You get hemodialysis treatment.  You take certain medicines for conditions, including cancer, organ transplantation, and autoimmune conditions. Hepatitis C  Blood testing is recommended for:  Everyone born from [birth year] through 1963.  Anyone with known risk factors for  hepatitis C. Sexually transmitted infections (STIs)  You should be screened for sexually transmitted infections (STIs) including gonorrhea and chlamydia if:  You are sexually active and are younger than 63 years of age.  You are older than 64 years of age and your health care provider tells you that you are at risk for this type of infection.  Your sexual activity has changed since you were last screened and  you are at an increased risk for chlamydia or gonorrhea. Ask your health care provider if you are at risk.  If you do not have HIV, but are at risk, it may be recommended that you take a prescription medicine daily to prevent HIV infection. This is called pre-exposure prophylaxis (PrEP). You are considered at risk if:  You are sexually active and do not regularly use condoms or know the HIV status of your partner(s).  You take drugs by injection.  You are sexually active with a partner who has HIV. Talk with your health care provider about whether you are at high risk of being infected with HIV. If you choose to begin PrEP, you should first be tested for HIV. You should then be tested every 3 months for as long as you are taking PrEP.  PREGNANCY   If you are premenopausal and you may become pregnant, ask your health care provider about preconception counseling.  If you may become pregnant, take 400 to 800 micrograms (mcg) of folic acid every day.  If you want to prevent pregnancy, talk to your health care provider about birth control (contraception). OSTEOPOROSIS AND MENOPAUSE   Osteoporosis is a disease in which the bones lose minerals and strength with aging. This can result in serious bone fractures. Your risk for osteoporosis can be identified using a bone density scan.  If you are 57 years of age or older, or if you are at risk for osteoporosis and fractures, ask your health care provider if you should be screened.  Ask your health care provider whether you should take a  calcium or vitamin D supplement to lower your risk for osteoporosis.  Menopause may have certain physical symptoms and risks.  Hormone replacement therapy may reduce some of these symptoms and risks. Talk to your health care provider about whether hormone replacement therapy is right for you.  HOME CARE INSTRUCTIONS   Schedule regular health, dental, and eye exams.  Stay current with your immunizations.   Do not use any tobacco products including cigarettes, chewing tobacco, or electronic cigarettes.  If you are pregnant, do not drink alcohol.  If you are breastfeeding, limit how much and how often you drink alcohol.  Limit alcohol intake to no more than 1 drink per day for nonpregnant women. One drink equals 12 ounces of beer, 5 ounces of wine, or 1 ounces of hard liquor.  Do not use street drugs.  Do not share needles.  Ask your health care provider for help if you need support or information about quitting drugs.  Tell your health care provider if you often feel depressed.  Tell your health care provider if you have ever been abused or do not feel safe at home.   This information is not intended to replace advice given to you by your health care provider. Make sure you discuss any questions you have with your health care provider.   Document Released: 03/02/2011 Document Revised: 09/07/2014 Document Reviewed: 07/19/2013 Elsevier Interactive Patient Education 2016 [employer]. Smoking Cessation, Tips for Success If you are ready to quit smoking, congratulations! You have chosen to help yourself be healthier. Cigarettes bring nicotine, tar, carbon monoxide, and other irritants into your body. Your lungs, heart, and blood vessels will be able to work better without these poisons. There are many different ways to quit smoking. Nicotine gum, nicotine patches, a nicotine inhaler, or nicotine nasal spray can help with physical craving. Hypnosis, support groups, and medicines help  break the  habit of smoking. WHAT THINGS CAN I DO TO MAKE QUITTING EASIER?  Here are some tips to help you quit for good:  Pick a date when you will quit smoking completely. Tell all of your friends and family about your plan to quit on that date.  Do not try to slowly cut down on the number of cigarettes you are smoking. Pick a quit date and quit smoking completely starting on that day.  Throw away all cigarettes.   Clean and remove all ashtrays from your home, work, and car.  On a card, write down your reasons for quitting. Carry the card with you and read it when you get the urge to smoke.  Cleanse your body of nicotine. Drink enough water and fluids to keep your urine clear or pale yellow. Do this after quitting to flush the nicotine from your body.  Learn to predict your moods. Do not let a bad situation be your excuse to have a cigarette. Some situations in your life might tempt you into wanting a cigarette.  Never have "just one" cigarette. It leads to wanting another and another. Remind yourself of your decision to quit.  Change habits associated with smoking. If you smoked while driving or when feeling stressed, try other activities to replace smoking. Stand up when drinking your coffee. Brush your teeth after eating. Sit in a different chair when you read the paper. Avoid alcohol while trying to quit, and try to drink fewer caffeinated beverages. Alcohol and caffeine may urge you to smoke.  Avoid foods and drinks that can trigger a desire to smoke, such as sugary or spicy foods and alcohol.  Ask people who smoke not to smoke around you.  Have something planned to do right after eating or having a cup of coffee. For example, plan to take a walk or exercise.  Try a relaxation exercise to calm you down and decrease your stress. Remember, you may be tense and nervous for the first 2 weeks after you quit, but this will pass.  Find new activities to keep your hands busy. Play with a  pen, coin, or rubber band. Doodle or draw things on paper.  Brush your teeth right after eating. This will help cut down on the craving for the taste of tobacco after meals. You can also try mouthwash.   Use oral substitutes in place of cigarettes. Try using lemon drops, carrots, cinnamon sticks, or chewing gum. Keep them handy so they are available when you have the urge to smoke.  When you have the urge to smoke, try deep breathing.  Designate your home as a nonsmoking area.  If you are a heavy smoker, ask your health care provider about a prescription for nicotine chewing gum. It can ease your withdrawal from nicotine.  Reward yourself. Set aside the cigarette money you save and buy yourself something nice.  Look for support from others. Join a support group or smoking cessation program. Ask someone at home or at work to help you with your plan to quit smoking.  Always ask yourself, "Do I need this cigarette or is this just a reflex?" Tell yourself, "Today, I choose not to smoke," or "I do not want to smoke." You are reminding yourself of your decision to quit.  Do not replace cigarette smoking with electronic cigarettes (commonly called e-cigarettes). The safety of e-cigarettes is unknown, and some may contain harmful chemicals.  If you relapse, do not give up! Plan ahead and think about what  you will do the next time you get the urge to smoke. HOW WILL I FEEL WHEN I QUIT SMOKING? You may have symptoms of withdrawal because your body is used to nicotine (the addictive substance in cigarettes). You may crave cigarettes, be irritable, feel very hungry, cough often, get headaches, or have difficulty concentrating. The withdrawal symptoms are only temporary. They are strongest when you first quit but will go away within 10-14 days. When withdrawal symptoms occur, stay in control. Think about your reasons for quitting. Remind yourself that these are signs that your body is healing and getting  used to being without cigarettes. Remember that withdrawal symptoms are easier to treat than the major diseases that smoking can cause.  Even after the withdrawal is over, expect periodic urges to smoke. However, these cravings are generally short lived and will go away whether you smoke or not. Do not smoke! WHAT RESOURCES ARE AVAILABLE TO HELP ME QUIT SMOKING? Your health care provider can direct you to community resources or hospitals for support, which may include:  Group support.  Education.  Hypnosis.  Therapy.   This information is not intended to replace advice given to you by your health care provider. Make sure you discuss any questions you have with your health care provider.   Document Released: 05/15/2004 Document Revised: 09/07/2014 Document Reviewed: 02/02/2013 Elsevier Interactive Patient Education [employer].

## 2015-11-14 NOTE — Assessment & Plan Note (Signed)
Refer to derm for eval and treatment of moles on glabella and left side neck/shoulder

## 2015-11-14 NOTE — Assessment & Plan Note (Signed)
Not to goal; increase amlodipine from 2.5 mg daily to 5 mg daily; return in 4 weeks for follow-up; she'll try to cut down on smoking; try DASH guidelines

## 2015-11-14 NOTE — Assessment & Plan Note (Signed)
Encouraged pt to quit smoking; I am here to help if/when needed; see AVS

## 2015-11-14 NOTE — Assessment & Plan Note (Signed)
Discovered on CBE; encouraged monthly SBE; order diagnostic imaging

## 2015-11-27 ENCOUNTER — Telehealth: Payer: Self-pay | Admitting: Family Medicine

## 2015-11-27 NOTE — Telephone Encounter (Signed)
Please check on orders: Diagnostic mammogram and breast US Thank you

## 2015-11-27 NOTE — Telephone Encounter (Signed)
I spoke with Mary [name], they will have to get her prior first from [employer] and once they get them, Mary [name] will call the patient to schedule.

## 2015-12-02 ENCOUNTER — Inpatient Hospital Stay
Admission: RE | Admit: 2015-12-02 | Discharge: 2015-12-02 | Disposition: A | Discharge status: Home / Self Care | Payer: Self-pay | Source: Ambulatory Visit | Attending: *Deleted | Admitting: *Deleted

## 2015-12-02 ENCOUNTER — Other Ambulatory Visit: Payer: Self-pay | Admitting: *Deleted

## 2015-12-02 DIAGNOSIS — Z9289 Personal history of other medical treatment: Secondary | ICD-10-CM

## 2016-02-11 ENCOUNTER — Telehealth: Payer: Self-pay | Admitting: Family Medicine

## 2016-02-11 NOTE — Telephone Encounter (Signed)
Please see March 29th phone note I have outstanding breast imaging with no results yet from March of this year Please check on this and follow-up; thank you

## 2016-02-11 NOTE — Telephone Encounter (Signed)
Left pt voicemail to call and setup mammogram and gave phone #

## 2016-02-18 DIAGNOSIS — L728 Other follicular cysts of the skin and subcutaneous tissue: Secondary | ICD-10-CM | POA: Diagnosis not present

## 2016-02-18 DIAGNOSIS — L821 Other seborrheic keratosis: Secondary | ICD-10-CM | POA: Diagnosis not present

## 2016-02-18 DIAGNOSIS — D485 Neoplasm of uncertain behavior of skin: Secondary | ICD-10-CM | POA: Diagnosis not present

## 2016-02-19 ENCOUNTER — Telehealth: Payer: Self-pay | Admitting: Family Medicine

## 2016-02-19 DIAGNOSIS — B079 Viral wart, unspecified: Secondary | ICD-10-CM | POA: Diagnosis not present

## 2016-02-19 NOTE — Telephone Encounter (Signed)
FYI. PT SAID THAT YOU SENT HER TO DERMATOLOGY AND A MOLE ON SHOULDER BLADE . THEY REMOVED KNOT ON HEAD BUT COULD NOT REMOVE THE KNOT ON SHOULDER AND IT WOULD COST TO MUCH.

## 2016-03-31 ENCOUNTER — Telehealth: Payer: Self-pay | Admitting: Family Medicine

## 2016-03-31 DIAGNOSIS — E785 Hyperlipidemia, unspecified: Secondary | ICD-10-CM | POA: Diagnosis not present

## 2016-03-31 DIAGNOSIS — I70211 Atherosclerosis of native arteries of extremities with intermittent claudication, right leg: Secondary | ICD-10-CM | POA: Diagnosis not present

## 2016-03-31 DIAGNOSIS — I70213 Atherosclerosis of native arteries of extremities with intermittent claudication, bilateral legs: Secondary | ICD-10-CM | POA: Diagnosis not present

## 2016-03-31 DIAGNOSIS — I739 Peripheral vascular disease, unspecified: Secondary | ICD-10-CM | POA: Diagnosis not present

## 2016-03-31 DIAGNOSIS — I10 Essential (primary) hypertension: Secondary | ICD-10-CM | POA: Diagnosis not present

## 2016-04-01 MED ORDER — COLCHICINE 0.6 MG PO TABS: ORAL_TABLET | ORAL | 1 refills | Status: DC

## 2016-04-01 NOTE — Telephone Encounter (Signed)
Pt.notified

## 2016-04-01 NOTE — Telephone Encounter (Signed)
Rx sent Please let her know that when she takes the colchicine for gout, do NOT take atorvastatin for 3 days Thank you

## 2016-06-09 ENCOUNTER — Other Ambulatory Visit: Payer: Self-pay | Admitting: Family Medicine

## 2016-06-10 NOTE — Telephone Encounter (Signed)
Patient needs an appointment in the next 3-4 weeks please I'll allow one more month of medicine, but she is overdue for visit (should have been seen in April) Thank you

## 2016-06-10 NOTE — Telephone Encounter (Signed)
Pt scheduled an appt for 06/30/2016

## 2016-06-30 ENCOUNTER — Ambulatory Visit (INDEPENDENT_AMBULATORY_CARE_PROVIDER_SITE_OTHER): Payer: Medicare Other | Admitting: Family Medicine

## 2016-06-30 ENCOUNTER — Encounter: Payer: Self-pay | Admitting: Family Medicine

## 2016-06-30 DIAGNOSIS — I10 Essential (primary) hypertension: Secondary | ICD-10-CM

## 2016-06-30 DIAGNOSIS — E876 Hypokalemia: Secondary | ICD-10-CM | POA: Diagnosis not present

## 2016-06-30 DIAGNOSIS — Z720 Tobacco use: Secondary | ICD-10-CM | POA: Diagnosis not present

## 2016-06-30 DIAGNOSIS — D72820 Lymphocytosis (symptomatic): Secondary | ICD-10-CM | POA: Diagnosis not present

## 2016-06-30 DIAGNOSIS — E790 Hyperuricemia without signs of inflammatory arthritis and tophaceous disease: Secondary | ICD-10-CM

## 2016-06-30 DIAGNOSIS — E782 Mixed hyperlipidemia: Secondary | ICD-10-CM

## 2016-06-30 DIAGNOSIS — Z5181 Encounter for therapeutic drug level monitoring: Secondary | ICD-10-CM

## 2016-06-30 LAB — COMPLETE METABOLIC PANEL WITH GFR
ALT: 11 U/L (ref 6–29)
AST: 18 U/L (ref 10–35)
Albumin: 4.1 g/dL (ref 3.6–5.1)
Alkaline Phosphatase: 86 U/L (ref 33–130)
BUN: 7 mg/dL (ref 7–25)
CO2: 29 mmol/L (ref 20–31)
Calcium: 9.4 mg/dL (ref 8.6–10.4)
Chloride: 103 mmol/L (ref 98–110)
Creat: 0.74 mg/dL (ref 0.50–0.99)
GFR, Est African American: >89 mL/min (ref >=60)
GFR, Est Non African American: 86 mL/min (ref >=60)
Glucose, Bld: 79 mg/dL (ref 65–99)
Potassium: 3.8 mmol/L (ref 3.5–5.3)
Sodium: 142 mmol/L (ref 135–146)
Total Bilirubin: 0.3 mg/dL (ref 0.2–1.2)
Total Protein: 7.2 g/dL (ref 6.1–8.1)

## 2016-06-30 LAB — CBC WITH DIFFERENTIAL/PLATELET
Basophils Absolute: 0 cells/uL (ref 0–200)
Basophils Relative: 0 %
Eosinophils Absolute: 204 cells/uL (ref 15–500)
Eosinophils Relative: 3 %
HCT: 40.8 % (ref 35.0–45.0)
Hemoglobin: 13.9 g/dL (ref 11.7–15.5)
Lymphocytes Relative: 54 %
Lymphs Abs: 3672 cells/uL (ref 850–3900)
MCH: 29.9 pg (ref 27.0–33.0)
MCHC: 34.1 g/dL (ref 32.0–36.0)
MCV: 87.7 fL (ref 80.0–100.0)
MPV: 8.5 fL (ref 7.5–12.5)
Monocytes Absolute: 748 cells/uL (ref 200–950)
Monocytes Relative: 11 %
Neutro Abs: 2176 cells/uL (ref 1500–7800)
Neutrophils Relative %: 32 %
Platelets: 381 10*3/uL (ref 140–400)
RBC: 4.65 MIL/uL (ref 3.80–5.10)
RDW: 13.9 % (ref 11.0–15.0)
WBC: 6.8 10*3/uL (ref 3.8–10.8)

## 2016-06-30 LAB — LIPID PANEL
Cholesterol: 132 mg/dL (ref 125–200)
HDL: 38 mg/dL — ABNORMAL LOW (ref >=46)
LDL Cholesterol: 67 mg/dL (ref <130)
Total CHOL/HDL Ratio: 3.5 Ratio (ref <=5.0)
Triglycerides: 134 mg/dL (ref <150)
VLDL: 27 mg/dL (ref <30)

## 2016-06-30 MED ORDER — AMLODIPINE BESYLATE 5 MG PO TABS: 5.0000 mg | ORAL_TABLET | Freq: Every day | ORAL | 11 refills | Status: DC

## 2016-06-30 NOTE — Assessment & Plan Note (Signed)
Check labs 

## 2016-06-30 NOTE — Assessment & Plan Note (Signed)
Check CBC 

## 2016-06-30 NOTE — Progress Notes (Signed)
BP 128/80 (BP Location: Left Arm, Patient Position: Sitting, Cuff Size: Normal)   Pulse 99   Temp 98.6 F (37 C) (Oral)   Resp 16   Ht 5' 6" (1.676 m)   Wt 137 lb 7 oz (62.3 kg)   SpO2 96%   BMI 22.18 kg/m    Subjective:    Patient ID: Mary [name], female    DOB: 07/17/1951, 65 y.o.   MRN: [id number]  HPI: Mary [name] is a 65 y.o. female  Chief Complaint  Patient presents with  . Medication Refill   Patient is here for follow-up; no medical excitement since last visit  High cholesterol; on statin; no aches; occasionally eats eggs; occasionally eats fatty meats; eats bowl of oatmeal; she had some hash browns  Hypertension; controlled; she does add some salt to her food, creamed potatoes, green beans, not to other foods; no decongestants; no black licorice  Smoking; I asked how's she doing: "not great"; husband has bladder cancer, Dr. Jacqlyn Larsen; some stress  PVD; seeing Dr. Lucky Cowboy; on statin from him; he does not check her lipids; coumadin; no bleeding  High uric acid; has occasional gout flare; maybe 2x a year  Depression screen [facility] 2/9 11/14/2015  Decreased Interest 0  Down, Depressed, Hopeless 0  PHQ - 2 Score 0   Relevant past medical, surgical, family and social history reviewed Past Medical History:  Diagnosis Date  . Hyperlipidemia   . Hypertension   . Hyperuricemia   . Osteoporosis   . PAD (peripheral artery disease) ([place]) 02/2010   angiplasty, popliteal artery  . Tobacco abuse    Past Surgical History:  Procedure Laterality Date  . ANGIOPLASTY  July 2011   PAD- popliteal artery  . Leg Bypass Surgery Right 08/2010  . OVARIAN CYST SURGERY  1980s  . POPLITEAL ARTERY STENT Right 05/2010  . POPLITEAL ARTERY STENT Right 07/2010   Family History  Problem Relation Age of Onset  . Arthritis Mother   . Diabetes Mother   . Stroke Mother   . Hypertension Mother   . Cancer Father   . Arthritis Sister   . Cancer Sister   . Seizures Sister   . Diabetes Sister     . Hypertension Sister   . COPD Sister   . Cancer Brother   . Diabetes Brother   . Heart disease Neg Hx    Social History  Substance Use Topics  . Smoking status: Current Every Day Smoker    Packs/day: 0.25    Types: Cigarettes  . Smokeless tobacco: Never Used  . Alcohol use No   Interim medical history since last visit reviewed. Allergies and medications reviewed  Review of Systems Per HPI unless specifically indicated above     Objective:    BP 128/80 (BP Location: Left Arm, Patient Position: Sitting, Cuff Size: Normal)   Pulse 99   Temp 98.6 F (37 C) (Oral)   Resp 16   Ht 5' 6" (1.676 m)   Wt 137 lb 7 oz (62.3 kg)   SpO2 96%   BMI 22.18 kg/m   Wt Readings from Last 3 Encounters:  06/30/16 137 lb 7 oz (62.3 kg)  11/14/15 137 lb (62.1 kg)  10/17/15 135 lb 6.4 oz (61.4 kg)    Physical Exam  Constitutional: She appears well-developed and well-nourished.  HENT:  Right Ear: Hearing, tympanic membrane and ear canal normal.  Left Ear: Hearing, tympanic membrane and ear canal normal.  Eyes: Conjunctivae and EOM  are normal. Right eye exhibits no hordeolum. Left eye exhibits no hordeolum. No scleral icterus.  Neck: Carotid bruit is not present. No thyromegaly present.  Cardiovascular: Normal rate, regular rhythm, S1 normal, S2 normal and normal heart sounds.   No extrasystoles are present.  Pulmonary/Chest: Effort normal and breath sounds normal. No respiratory distress.  Abdominal: Soft. Normal appearance and bowel sounds are normal. She exhibits no distension. There is no hepatosplenomegaly.  Musculoskeletal: Normal range of motion. She exhibits no edema.  Neurological: She is alert. She displays no tremor. No cranial nerve deficit. She exhibits normal muscle tone. Gait normal.  Skin: Skin is warm and dry. No cyanosis. No pallor.  Psychiatric: Her speech is normal and behavior is normal. Thought content normal. Her mood appears not anxious. She does not exhibit a  depressed mood.   Results for orders placed or performed in visit on 06/30/16  Uric acid  Result Value Ref Range   Uric Acid, Serum 6.3 2.5 - 7.0 mg/dL  COMPLETE METABOLIC PANEL WITH GFR  Result Value Ref Range   Sodium 142 135 - 146 mmol/L   Potassium 3.8 3.5 - 5.3 mmol/L   Chloride 103 98 - 110 mmol/L   CO2 29 20 - 31 mmol/L   Glucose, Bld 79 65 - 99 mg/dL   BUN 7 7 - 25 mg/dL   Creat 0.74 0.50 - 0.99 mg/dL   Total Bilirubin 0.3 0.2 - 1.2 mg/dL   Alkaline Phosphatase 86 33 - 130 U/L   AST 18 10 - 35 U/L   ALT 11 6 - 29 U/L   Total Protein 7.2 6.1 - 8.1 g/dL   Albumin 4.1 3.6 - 5.1 g/dL   Calcium 9.4 8.6 - 10.4 mg/dL   GFR, Est African American >89 >=60 mL/min   GFR, Est Non African American 86 >=60 mL/min  Lipid panel  Result Value Ref Range   Cholesterol 132 125 - 200 mg/dL   Triglycerides 134 <150 mg/dL   HDL 38 (L) >=46 mg/dL   Total CHOL/HDL Ratio 3.5 <=5.0 Ratio   VLDL 27 <30 mg/dL   LDL Cholesterol 67 <130 mg/dL  CBC with Differential/Platelet  Result Value Ref Range   WBC 6.8 3.8 - 10.8 K/uL   RBC 4.65 3.80 - 5.10 MIL/uL   Hemoglobin 13.9 11.7 - 15.5 g/dL   HCT 40.8 35.0 - 45.0 %   MCV 87.7 80.0 - 100.0 fL   MCH 29.9 27.0 - 33.0 pg   MCHC 34.1 32.0 - 36.0 g/dL   RDW 13.9 11.0 - 15.0 %   Platelets 381 140 - 400 K/uL   MPV 8.5 7.5 - 12.5 fL   Neutro Abs 2,176 1,500 - 7,800 cells/uL   Lymphs Abs 3,672 850 - 3,900 cells/uL   Monocytes Absolute 748 200 - 950 cells/uL   Eosinophils Absolute 204 15 - 500 cells/uL   Basophils Absolute 0 0 - 200 cells/uL   Neutrophils Relative % 32 %   Lymphocytes Relative 54 %   Monocytes Relative 11 %   Eosinophils Relative 3 %   Basophils Relative 0 %   Smear Review Criteria for review not met       Assessment & Plan:   Problem List Items Addressed This Visit      Cardiovascular and Mediastinum   Hypertension (Chronic)    Controlled today; limit salt, avoid decongestants; check kidney function; refill CCB; try the  DASH guidelines      Relevant Medications   amLODipine (NORVASC)   5 MG tablet     Other   Tobacco abuse (Chronic)    Urged her to quit, see AVS      Medication monitoring encounter    Check labs      Relevant Orders   COMPLETE METABOLIC PANEL WITH GFR (Completed)   Lymphocytosis    Check CBC      Relevant Orders   CBC with Differential/Platelet (Completed)   Hypokalemia    Check K+ and see if stable      Hyperuricemia (Chronic)    Cautioned about turkey and gravy; check uric acid      Relevant Orders   Uric acid (Completed)   Hyperlipidemia (Chronic)    Check cholesterol; statin rxd by vascular doctor      Relevant Medications   amLODipine (NORVASC) 5 MG tablet   Other Relevant Orders   Lipid panel (Completed)       Follow up plan: Return in about 6 months (around 12/28/2016) for fasting labs and visit.  An after-visit summary was printed and given to the patient at check-out.  Please see the patient instructions which may contain other information and recommendations beyond what is mentioned above in the assessment and plan.  Meds ordered this encounter  Medications  . amLODipine (NORVASC) 5 MG tablet    Sig: Take 1 tablet (5 mg total) by mouth daily.    Dispense:  30 tablet    Refill:  11    Orders Placed This Encounter  Procedures  . Uric acid  . COMPLETE METABOLIC PANEL WITH GFR  . Lipid panel  . CBC with Differential/Platelet   

## 2016-06-30 NOTE — Assessment & Plan Note (Signed)
Cautioned about [place] and gravy; check uric acid

## 2016-06-30 NOTE — Assessment & Plan Note (Signed)
Urged her to quit, see AVS

## 2016-06-30 NOTE — Patient Instructions (Addendum)
Try to limit saturated fats in your diet (bologna, hot dogs, barbeque, cheeseburgers, hamburgers, steak, bacon, sausage, cheese, etc.) and get more fresh fruits, vegetables, and whole grains I do encourage you to quit smoking Call [phone] to sign up for smoking cessation classes You can call 1-800-QUIT-NOW to talk with a smoking cessation coach Your goal blood pressure is less than 140 mmHg on top. Try to follow the DASH guidelines (DASH stands for Dietary Approaches to Stop Hypertension) Try to limit the sodium in your diet.  Ideally, consume less than 1.5 grams (less than 1,500mg ) per day. Do not add salt when cooking or at the table.  Check the sodium amount on labels when shopping, and choose items lower in sodium when given a choice. Avoid or limit foods that already contain a lot of sodium. Eat a diet rich in fruits and vegetables and whole grains. DASH Eating Plan DASH stands for "Dietary Approaches to Stop Hypertension." The DASH eating plan is a healthy eating plan that has been shown to reduce high blood pressure (hypertension). Additional health benefits may include reducing the risk of type 2 diabetes mellitus, heart disease, and stroke. The DASH eating plan may also help with weight loss. WHAT DO I NEED TO KNOW ABOUT THE DASH EATING PLAN? For the DASH eating plan, you will follow these general guidelines:  Choose foods with a percent daily value for sodium of less than 5% (as listed on the food label).  Use salt-free seasonings or herbs instead of table salt or sea salt.  Check with your health care provider or pharmacist before using salt substitutes.  Eat lower-sodium products, often labeled as "lower sodium" or "no salt added."  Eat fresh foods.  Eat more vegetables, fruits, and low-fat dairy products.  Choose whole grains. Look for the word "whole" as the first word in the ingredient list.  Choose fish and skinless chicken or [place] more often than red meat. Limit  fish, poultry, and meat to 6 oz (170 g) each day.  Limit sweets, desserts, sugars, and sugary drinks.  Choose heart-healthy fats.  Limit cheese to 1 oz (28 g) per day.  Eat more home-cooked food and less restaurant, buffet, and fast food.  Limit fried foods.  Cook foods using methods other than frying.  Limit canned vegetables. If you do use them, rinse them well to decrease the sodium.  When eating at a restaurant, ask that your food be prepared with less salt, or no salt if possible. WHAT FOODS CAN I EAT? Seek help from a dietitian for individual calorie needs. Grains Whole grain or whole wheat bread. Brown rice. Whole grain or whole wheat pasta. Quinoa, bulgur, and whole grain cereals. Low-sodium cereals. Corn or whole wheat flour tortillas. Whole grain cornbread. Whole grain crackers. Low-sodium crackers. Vegetables Fresh or frozen vegetables (raw, steamed, roasted, or grilled). Low-sodium or reduced-sodium tomato and vegetable juices. Low-sodium or reduced-sodium tomato sauce and paste. Low-sodium or reduced-sodium canned vegetables.  Fruits All fresh, canned (in natural juice), or frozen fruits. Meat and Other Protein Products Ground beef (85% or leaner), grass-fed beef, or beef trimmed of fat. Skinless chicken or [place]. Ground chicken or [place]. Pork trimmed of fat. All fish and seafood. Eggs. Dried beans, peas, or lentils. Unsalted nuts and seeds. Unsalted canned beans. Dairy Low-fat dairy products, such as skim or 1% milk, 2% or reduced-fat cheeses, low-fat ricotta or cottage cheese, or plain low-fat yogurt. Low-sodium or reduced-sodium cheeses. Fats and Oils Tub margarines without trans fats. Light  or reduced-fat mayonnaise and salad dressings (reduced sodium). Avocado. Safflower, olive, or canola oils. Natural peanut or almond butter. Other Unsalted popcorn and pretzels. The items listed above may not be a complete list of recommended foods or beverages. Contact your  dietitian for more options. WHAT FOODS ARE NOT RECOMMENDED? Grains White bread. White pasta. White rice. Refined cornbread. Bagels and croissants. Crackers that contain trans fat. Vegetables Creamed or fried vegetables. Vegetables in a cheese sauce. Regular canned vegetables. Regular canned tomato sauce and paste. Regular tomato and vegetable juices. Fruits Dried fruits. Canned fruit in light or heavy syrup. Fruit juice. Meat and Other Protein Products Fatty cuts of meat. Ribs, chicken wings, bacon, sausage, bologna, salami, chitterlings, fatback, hot dogs, bratwurst, and packaged luncheon meats. Salted nuts and seeds. Canned beans with salt. Dairy Whole or 2% milk, cream, half-and-half, and cream cheese. Whole-fat or sweetened yogurt. Full-fat cheeses or blue cheese. Nondairy creamers and whipped toppings. Processed cheese, cheese spreads, or cheese curds. Condiments Onion and garlic salt, seasoned salt, table salt, and sea salt. Canned and packaged gravies. Worcestershire sauce. Tartar sauce. Barbecue sauce. Teriyaki sauce. Soy sauce, including reduced sodium. Steak sauce. Fish sauce. Oyster sauce. Cocktail sauce. Horseradish. Ketchup and mustard. Meat flavorings and tenderizers. Bouillon cubes. Hot sauce. Tabasco sauce. Marinades. Taco seasonings. Relishes. Fats and Oils Butter, stick margarine, lard, shortening, ghee, and bacon fat. Coconut, palm kernel, or palm oils. Regular salad dressings. Other Pickles and olives. Salted popcorn and pretzels. The items listed above may not be a complete list of foods and beverages to avoid. Contact your dietitian for more information. WHERE CAN I FIND MORE INFORMATION? National Heart, Lung, and Blood Institute: travelstabloid.com   This information is not intended to replace advice given to you by your health care provider. Make sure you discuss any questions you have with your health care provider.   Document  Released: 08/06/2011 Document Revised: 09/07/2014 Document Reviewed: 06/21/2013 Elsevier Interactive Patient Education [employer].

## 2016-06-30 NOTE — Assessment & Plan Note (Signed)
Check K+ and see if stable

## 2016-06-30 NOTE — Assessment & Plan Note (Signed)
Check cholesterol; statin rxd by vascular doctor

## 2016-06-30 NOTE — Assessment & Plan Note (Signed)
Controlled today; limit salt, avoid decongestants; check kidney function; refill CCB; try the DASH guidelines

## 2016-07-01 LAB — URIC ACID: Uric Acid, Serum: 6.3 mg/dL (ref 2.5–7.0)

## 2016-08-28 ENCOUNTER — Encounter (INDEPENDENT_AMBULATORY_CARE_PROVIDER_SITE_OTHER): Payer: Medicare Other

## 2016-08-28 ENCOUNTER — Ambulatory Visit (INDEPENDENT_AMBULATORY_CARE_PROVIDER_SITE_OTHER): Payer: Self-pay | Admitting: Vascular Surgery

## 2016-10-12 ENCOUNTER — Other Ambulatory Visit (INDEPENDENT_AMBULATORY_CARE_PROVIDER_SITE_OTHER): Payer: Self-pay | Admitting: Vascular Surgery

## 2016-11-10 ENCOUNTER — Other Ambulatory Visit (INDEPENDENT_AMBULATORY_CARE_PROVIDER_SITE_OTHER): Payer: Self-pay | Admitting: Vascular Surgery

## 2016-11-12 ENCOUNTER — Other Ambulatory Visit (INDEPENDENT_AMBULATORY_CARE_PROVIDER_SITE_OTHER): Payer: Self-pay | Admitting: Vascular Surgery

## 2016-11-13 ENCOUNTER — Ambulatory Visit (INDEPENDENT_AMBULATORY_CARE_PROVIDER_SITE_OTHER): Payer: Medicare Other | Admitting: Family Medicine

## 2016-11-13 ENCOUNTER — Telehealth: Payer: Self-pay

## 2016-11-13 ENCOUNTER — Encounter: Payer: Self-pay | Admitting: Family Medicine

## 2016-11-13 DIAGNOSIS — Z720 Tobacco use: Secondary | ICD-10-CM | POA: Diagnosis not present

## 2016-11-13 DIAGNOSIS — R1909 Other intra-abdominal and pelvic swelling, mass and lump: Secondary | ICD-10-CM | POA: Diagnosis not present

## 2016-11-13 NOTE — Patient Instructions (Signed)
I do encourage you to quit smoking Call [phone] to sign up for smoking cessation classes You can call 1-800-QUIT-NOW to talk with a smoking cessation coach  We'll get a scan of the area that is bothering you If you have not heard anything from my staff in a week about any orders/referrals/studies from today, please contact us here to follow-up [phone] Tylenol per package directions is what I would recommend for discomfort

## 2016-11-13 NOTE — Telephone Encounter (Signed)
I contacted this patient to inform her that the order will be sent to [facility] (across from [facility]) and that they will contact her to get scheduled but that we need her to come by to have some labs before they will schedule her.

## 2016-11-13 NOTE — Progress Notes (Signed)
BP 120/82   Pulse 96   Temp 98.2 F (36.8 C) (Oral)   Resp 14   Wt 133 lb (60.3 kg)   SpO2 97%   BMI 21.47 kg/m    Subjective:    Patient ID: Mary [name], female    DOB: Aug 28, 1951, 66 y.o.   MRN: [id number]  HPI: Mary [name] is a 66 y.o. female  Chief Complaint  Patient presents with  . Hip Pain    left onset 2 weeks   She is here for an acute visit; left hip pain x 2 weeks; no previous injury It feels like... "I can't describe it"; like sometimes when you get up and have a pain right in here Leg is not weak at all Some pains over the left thigh, but nothing shooting down towards calf or ankle She has tried tylenol; did not do anything really She has not tried heat or ice or anything like that  She saw the dermatologist to get that mole removed, she says  Depression screen [facility] 2/9 11/13/2016 11/14/2015  Decreased Interest 0 0  Down, Depressed, Hopeless 0 0  PHQ - 2 Score 0 0   Relevant past medical, surgical, family and social history reviewed Past Medical History:  Diagnosis Date  . Hyperlipidemia   . Hypertension   . Hyperuricemia   . Osteoporosis   . PAD (peripheral artery disease) ([place]) 02/2010   angiplasty, popliteal artery  . Tobacco abuse    Past Surgical History:  Procedure Laterality Date  . ANGIOPLASTY  July 2011   PAD- popliteal artery  . Leg Bypass Surgery Right 08/2010  . OVARIAN CYST SURGERY  1980s  . POPLITEAL ARTERY STENT Right 05/2010  . POPLITEAL ARTERY STENT Right 07/2010   Family History  Problem Relation Age of Onset  . Arthritis Mother   . Diabetes Mother   . Stroke Mother   . Hypertension Mother   . Cancer Father   . Arthritis Sister   . Seizures Sister   . Diabetes Sister   . Hypertension Sister   . COPD Sister   . Cancer Brother   . Stroke Maternal Grandmother   . Diabetes Paternal Grandmother   . Cancer Sister   . Diabetes Sister   . Diabetes Brother   . Diabetes Brother   . Heart disease Neg Hx    Social History    Substance Use Topics  . Smoking status: Current Every Day Smoker    Packs/day: 0.25    Types: Cigarettes  . Smokeless tobacco: Never Used  . Alcohol use No   Interim medical history since last visit reviewed. Allergies and medications reviewed  Review of Systems Per HPI unless specifically indicated above     Objective:    BP 120/82   Pulse 96   Temp 98.2 F (36.8 C) (Oral)   Resp 14   Wt 133 lb (60.3 kg)   SpO2 97%   BMI 21.47 kg/m   Wt Readings from Last 3 Encounters:  11/13/16 133 lb (60.3 kg)  06/30/16 137 lb 7 oz (62.3 kg)  11/14/15 137 lb (62.1 kg)    Physical Exam  Constitutional: She appears well-developed and well-nourished. No distress.  Cardiovascular: Normal rate.   Pulmonary/Chest: Effort normal.  Musculoskeletal:       Left hip: She exhibits decreased range of motion. She exhibits normal strength, no tenderness, no bony tenderness, no swelling, no crepitus and no deformity.  Lymphadenopathy:  Left: Inguinal adenopathy present.  Neurological:  No LE weakness  Psychiatric: Her mood appears not anxious.      Assessment & Plan:   Problem List Items Addressed This Visit      Other   Tobacco abuse (Chronic)    Encouraged cessation      Swelling of inguinal region    ddx discussed, lymphadenopathy, atherosclerosis, tendinopathy; will get CT scan given what feels like a swollen lymph node      Relevant Orders   CT Abdomen Pelvis W Contrast   BUN+Creat      Follow up plan: No Follow-up on file.  An after-visit summary was printed and given to the patient at [place].  Please see the patient instructions which may contain other information and recommendations beyond what is mentioned above in the assessment and plan.  No orders of the defined types were placed in this encounter.   Orders Placed This Encounter  Procedures  . CT Abdomen Pelvis W Contrast  . BUN+Creat

## 2016-11-13 NOTE — Assessment & Plan Note (Signed)
ddx discussed, lymphadenopathy, atherosclerosis, tendinopathy; will get CT scan given what feels like a swollen lymph node

## 2016-11-16 ENCOUNTER — Other Ambulatory Visit: Payer: Self-pay

## 2016-11-16 DIAGNOSIS — R1909 Other intra-abdominal and pelvic swelling, mass and lump: Secondary | ICD-10-CM | POA: Diagnosis not present

## 2016-11-18 ENCOUNTER — Other Ambulatory Visit: Payer: Self-pay | Admitting: Family Medicine

## 2016-11-18 NOTE — Progress Notes (Signed)
Just need the BUN and creatinine, not the ratio; discussed with Angie at [facility]

## 2016-11-19 LAB — CREATININE, SERUM: Creat: 0.73 mg/dL (ref 0.50–0.99)

## 2016-11-19 LAB — BUN: BUN: 8 mg/dL (ref 7–25)

## 2016-11-20 DIAGNOSIS — K8020 Calculus of gallbladder without cholecystitis without obstruction: Secondary | ICD-10-CM | POA: Diagnosis not present

## 2016-11-20 DIAGNOSIS — R935 Abnormal findings on diagnostic imaging of other abdominal regions, including retroperitoneum: Secondary | ICD-10-CM | POA: Diagnosis not present

## 2016-11-20 DIAGNOSIS — K5730 Diverticulosis of large intestine without perforation or abscess without bleeding: Secondary | ICD-10-CM | POA: Diagnosis not present

## 2016-11-20 DIAGNOSIS — R937 Abnormal findings on diagnostic imaging of other parts of musculoskeletal system: Secondary | ICD-10-CM | POA: Diagnosis not present

## 2016-11-20 DIAGNOSIS — K8689 Other specified diseases of pancreas: Secondary | ICD-10-CM | POA: Diagnosis not present

## 2016-11-29 NOTE — Assessment & Plan Note (Signed)
Encouraged cessation.

## 2016-11-30 ENCOUNTER — Other Ambulatory Visit: Payer: Self-pay

## 2016-12-03 ENCOUNTER — Telehealth: Payer: Self-pay

## 2016-12-04 ENCOUNTER — Other Ambulatory Visit: Payer: Self-pay | Admitting: Family Medicine

## 2016-12-04 DIAGNOSIS — M25559 Pain in unspecified hip: Secondary | ICD-10-CM | POA: Insufficient documentation

## 2016-12-04 DIAGNOSIS — M25551 Pain in right hip: Secondary | ICD-10-CM

## 2016-12-04 DIAGNOSIS — M25552 Pain in left hip: Principal | ICD-10-CM

## 2016-12-04 MED ORDER — TRAMADOL HCL 50 MG PO TABS: 50.0000 mg | ORAL_TABLET | Freq: Four times a day (QID) | ORAL | 0 refills | Status: DC | PRN

## 2016-12-04 NOTE — Assessment & Plan Note (Signed)
Refer to ortho.

## 2016-12-04 NOTE — Telephone Encounter (Signed)
error 

## 2016-12-07 ENCOUNTER — Telehealth: Payer: Self-pay | Admitting: Family Medicine

## 2016-12-07 DIAGNOSIS — M1612 Unilateral primary osteoarthritis, left hip: Secondary | ICD-10-CM | POA: Diagnosis not present

## 2016-12-07 NOTE — Telephone Encounter (Signed)
Pt returning your call [phone]

## 2016-12-16 DIAGNOSIS — M1612 Unilateral primary osteoarthritis, left hip: Secondary | ICD-10-CM | POA: Diagnosis not present

## 2016-12-16 DIAGNOSIS — M25552 Pain in left hip: Secondary | ICD-10-CM | POA: Diagnosis not present

## 2016-12-24 DIAGNOSIS — M25552 Pain in left hip: Secondary | ICD-10-CM | POA: Diagnosis not present

## 2016-12-24 DIAGNOSIS — M25652 Stiffness of left hip, not elsewhere classified: Secondary | ICD-10-CM | POA: Diagnosis not present

## 2016-12-29 ENCOUNTER — Ambulatory Visit: Payer: Medicare Other | Admitting: Family Medicine

## 2017-01-01 DIAGNOSIS — M25652 Stiffness of left hip, not elsewhere classified: Secondary | ICD-10-CM | POA: Diagnosis not present

## 2017-01-01 DIAGNOSIS — M25552 Pain in left hip: Secondary | ICD-10-CM | POA: Diagnosis not present

## 2017-01-07 DIAGNOSIS — M25652 Stiffness of left hip, not elsewhere classified: Secondary | ICD-10-CM | POA: Diagnosis not present

## 2017-01-07 DIAGNOSIS — M25552 Pain in left hip: Secondary | ICD-10-CM | POA: Diagnosis not present

## 2017-01-12 DIAGNOSIS — M25552 Pain in left hip: Secondary | ICD-10-CM | POA: Diagnosis not present

## 2017-01-12 DIAGNOSIS — M25652 Stiffness of left hip, not elsewhere classified: Secondary | ICD-10-CM | POA: Diagnosis not present

## 2017-02-25 DIAGNOSIS — M25552 Pain in left hip: Secondary | ICD-10-CM | POA: Diagnosis not present

## 2017-02-25 DIAGNOSIS — M25652 Stiffness of left hip, not elsewhere classified: Secondary | ICD-10-CM | POA: Diagnosis not present

## 2017-03-01 ENCOUNTER — Other Ambulatory Visit (INDEPENDENT_AMBULATORY_CARE_PROVIDER_SITE_OTHER): Payer: Self-pay | Admitting: Vascular Surgery

## 2017-03-04 ENCOUNTER — Other Ambulatory Visit (INDEPENDENT_AMBULATORY_CARE_PROVIDER_SITE_OTHER): Payer: Self-pay | Admitting: Vascular Surgery

## 2017-03-05 ENCOUNTER — Other Ambulatory Visit (INDEPENDENT_AMBULATORY_CARE_PROVIDER_SITE_OTHER): Payer: Self-pay | Admitting: Vascular Surgery

## 2017-03-08 ENCOUNTER — Other Ambulatory Visit: Payer: Self-pay | Admitting: Specialist

## 2017-03-08 DIAGNOSIS — M25552 Pain in left hip: Secondary | ICD-10-CM

## 2017-03-08 DIAGNOSIS — M25652 Stiffness of left hip, not elsewhere classified: Secondary | ICD-10-CM

## 2017-03-17 ENCOUNTER — Ambulatory Visit: Payer: Self-pay

## 2017-03-18 ENCOUNTER — Other Ambulatory Visit: Payer: Self-pay

## 2017-03-22 ENCOUNTER — Inpatient Hospital Stay: Admission: RE | Admit: 2017-03-22 | Payer: Self-pay | Source: Ambulatory Visit

## 2017-04-02 DIAGNOSIS — M25652 Stiffness of left hip, not elsewhere classified: Secondary | ICD-10-CM | POA: Diagnosis not present

## 2017-04-02 DIAGNOSIS — M25552 Pain in left hip: Secondary | ICD-10-CM | POA: Diagnosis not present

## 2017-04-08 ENCOUNTER — Ambulatory Visit: Payer: Self-pay

## 2017-04-15 ENCOUNTER — Ambulatory Visit
Admission: RE | Admit: 2017-04-15 | Discharge: 2017-04-15 | Disposition: A | Discharge status: Home / Self Care | Payer: Medicare Other | Source: Ambulatory Visit | Attending: Specialist | Admitting: Specialist

## 2017-04-15 DIAGNOSIS — M25552 Pain in left hip: Secondary | ICD-10-CM | POA: Insufficient documentation

## 2017-04-15 DIAGNOSIS — Z0189 Encounter for other specified special examinations: Secondary | ICD-10-CM | POA: Insufficient documentation

## 2017-04-15 DIAGNOSIS — M25652 Stiffness of left hip, not elsewhere classified: Secondary | ICD-10-CM | POA: Diagnosis not present

## 2017-04-15 LAB — CBC
HCT: 41.2 % (ref 35.0–47.0)
Hemoglobin: 14.1 g/dL (ref 12.0–16.0)
MCH: 29.9 pg (ref 26.0–34.0)
MCHC: 34.2 g/dL (ref 32.0–36.0)
MCV: 87.3 fL (ref 80.0–100.0)
Platelets: 309 10*3/uL (ref 150–440)
RBC: 4.72 MIL/uL (ref 3.80–5.20)
RDW: 14.3 % (ref 11.5–14.5)
WBC: 7 10*3/uL (ref 3.6–11.0)

## 2017-04-15 LAB — PROTIME-INR
INR: 1.12
Prothrombin Time: 14.5 seconds (ref 11.4–15.2)

## 2017-04-15 MED ORDER — METHYLPREDNISOLONE ACETATE 40 MG/ML INJ SUSP (RADIOLOG
80.0000 mg | Freq: Once | INTRAMUSCULAR | Status: AC
  Administered 2017-04-15: 80 mg via INTRAMUSCULAR

## 2017-04-15 MED ORDER — LIDOCAINE HCL (PF) 1 % IJ SOLN
5.0000 mL | Freq: Once | INTRAMUSCULAR | Status: AC
  Administered 2017-04-15: 5 mL
  Filled 2017-04-15: qty 5

## 2017-04-15 MED ORDER — IOPAMIDOL (ISOVUE-200) INJECTION 41%
7.0000 mL | Freq: Once | INTRAVENOUS | Status: AC | PRN
  Administered 2017-04-15: 7 mL
  Filled 2017-04-15: qty 50

## 2017-04-15 MED ORDER — BUPIVACAINE HCL (PF) 0.25 % IJ SOLN
7.0000 mL | Freq: Once | INTRAMUSCULAR | Status: AC
  Administered 2017-04-15: 7 mL
  Filled 2017-04-15: qty 10

## 2017-05-10 ENCOUNTER — Other Ambulatory Visit (INDEPENDENT_AMBULATORY_CARE_PROVIDER_SITE_OTHER): Payer: Self-pay | Admitting: Vascular Surgery

## 2017-07-08 ENCOUNTER — Encounter: Payer: Self-pay | Admitting: Family Medicine

## 2017-07-08 ENCOUNTER — Ambulatory Visit (INDEPENDENT_AMBULATORY_CARE_PROVIDER_SITE_OTHER): Payer: Medicare Other | Admitting: Family Medicine

## 2017-07-08 DIAGNOSIS — E782 Mixed hyperlipidemia: Secondary | ICD-10-CM | POA: Diagnosis not present

## 2017-07-08 DIAGNOSIS — I739 Peripheral vascular disease, unspecified: Secondary | ICD-10-CM

## 2017-07-08 DIAGNOSIS — D72820 Lymphocytosis (symptomatic): Secondary | ICD-10-CM

## 2017-07-08 DIAGNOSIS — Z5181 Encounter for therapeutic drug level monitoring: Secondary | ICD-10-CM | POA: Diagnosis not present

## 2017-07-08 DIAGNOSIS — N6324 Unspecified lump in the left breast, lower inner quadrant: Secondary | ICD-10-CM | POA: Insufficient documentation

## 2017-07-08 DIAGNOSIS — E790 Hyperuricemia without signs of inflammatory arthritis and tophaceous disease: Secondary | ICD-10-CM | POA: Diagnosis not present

## 2017-07-08 DIAGNOSIS — I10 Essential (primary) hypertension: Secondary | ICD-10-CM

## 2017-07-08 DIAGNOSIS — Z720 Tobacco use: Secondary | ICD-10-CM | POA: Diagnosis not present

## 2017-07-08 DIAGNOSIS — M25551 Pain in right hip: Secondary | ICD-10-CM

## 2017-07-08 DIAGNOSIS — M25552 Pain in left hip: Secondary | ICD-10-CM

## 2017-07-08 DIAGNOSIS — N6313 Unspecified lump in the right breast, lower outer quadrant: Secondary | ICD-10-CM | POA: Diagnosis not present

## 2017-07-08 LAB — COMPLETE METABOLIC PANEL WITH GFR
AG Ratio: 1.4 (calc) (ref 1.0–2.5)
ALT: 8 U/L (ref 6–29)
AST: 17 U/L (ref 10–35)
Albumin: 4.2 g/dL (ref 3.6–5.1)
Alkaline phosphatase (APISO): 87 U/L (ref 33–130)
BUN/Creatinine Ratio: 10 (calc) (ref 6–22)
BUN: 6 mg/dL — ABNORMAL LOW (ref 7–25)
CO2: 31 mmol/L (ref 20–32)
Calcium: 9.4 mg/dL (ref 8.6–10.4)
Chloride: 102 mmol/L (ref 98–110)
Creat: 0.6 mg/dL (ref 0.50–0.99)
GFR, Est African American: 112 mL/min/{1.73_m2} (ref >=60)
GFR, Est Non African American: 96 mL/min/{1.73_m2} (ref >=60)
Globulin: 3 g/dL (calc) (ref 1.9–3.7)
Glucose, Bld: 77 mg/dL (ref 65–99)
Potassium: 3.7 mmol/L (ref 3.5–5.3)
Sodium: 141 mmol/L (ref 135–146)
Total Bilirubin: 0.4 mg/dL (ref 0.2–1.2)
Total Protein: 7.2 g/dL (ref 6.1–8.1)

## 2017-07-08 LAB — LIPID PANEL
Cholesterol: 129 mg/dL (ref <200)
HDL: 46 mg/dL — ABNORMAL LOW (ref >50)
LDL Cholesterol (Calc): 62 mg/dL (calc)
Non-HDL Cholesterol (Calc): 83 mg/dL (calc) (ref <130)
Total CHOL/HDL Ratio: 2.8 (calc) (ref <5.0)
Triglycerides: 129 mg/dL (ref <150)

## 2017-07-08 LAB — CBC WITH DIFFERENTIAL/PLATELET
Basophils Absolute: 30 cells/uL (ref 0–200)
Basophils Relative: 0.4 %
Eosinophils Absolute: 148 cells/uL (ref 15–500)
Eosinophils Relative: 2 %
HCT: 39.8 % (ref 35.0–45.0)
Hemoglobin: 14 g/dL (ref 11.7–15.5)
Lymphs Abs: 3700 cells/uL (ref 850–3900)
MCH: 29.9 pg (ref 27.0–33.0)
MCHC: 35.2 g/dL (ref 32.0–36.0)
MCV: 85 fL (ref 80.0–100.0)
MPV: 9.4 fL (ref 7.5–12.5)
Monocytes Relative: 8.5 %
Neutro Abs: 2893 cells/uL (ref 1500–7800)
Neutrophils Relative %: 39.1 %
Platelets: 323 10*3/uL (ref 140–400)
RBC: 4.68 10*6/uL (ref 3.80–5.10)
RDW: 13.7 % (ref 11.0–15.0)
Total Lymphocyte: 50 %
WBC mixed population: 629 cells/uL (ref 200–950)
WBC: 7.4 10*3/uL (ref 3.8–10.8)

## 2017-07-08 LAB — URIC ACID: Uric Acid, Serum: 6.1 mg/dL (ref 2.5–7.0)

## 2017-07-08 MED ORDER — AMLODIPINE BESYLATE 5 MG PO TABS: 5.0000 mg | ORAL_TABLET | Freq: Every day | ORAL | 11 refills | Status: DC

## 2017-07-08 NOTE — Assessment & Plan Note (Signed)
diag mammo and [place]

## 2017-07-08 NOTE — Progress Notes (Signed)
BP (!) 142/78   Pulse 87   Temp 97.6 F (36.4 C) (Oral)   Resp 14   Wt 125 lb 4.8 oz (56.8 kg)   SpO2 93%   BMI 21.51 kg/m    Subjective:    Patient ID: Mary [name], female    DOB: Dec 17, 1950, 66 y.o.   MRN: [id number]  HPI: Mary [name] is a 66 y.o. female  Chief Complaint  Patient presents with  . Medication Refill   HPI Bothered by her hip; bothering her for 3 months; she is going to have surgery soon; using cane; left hip is the bad one; seeing Dr. Tamala Julian at orthopaedist; using tylenol but not helpful enough; she already had injection into the joint; it helped a little bit  Her husband struggling with bladder cancer; seeing Dr. Jacqlyn Larsen; giving him treatments up in the bladder, BCG  She has high blood pressure; taking amlodipine; no problems with constipation; she does try to stay away from salt  High cholesterol; on statin atorvastatin; no muscle aches; still eats some fatty meats, "you gotta eat something" she says  She has PVD; sees vascular doctor yearly; feet do not turn cold or purple  She is still smoking, "a little bit", maybe a half of a pack a day  She had a breast lump found in 2017; she does not think she ever went to have the additional imaging done; I reviewed the chart; see old films from 2014; patient cannot remember her last mammogram; she has not actually felt any lumps  Depression screen [facility] 2/9 07/08/2017 11/13/2016 11/14/2015  Decreased Interest 0 0 0  Down, Depressed, Hopeless 0 0 0  PHQ - 2 Score 0 0 0    Relevant past medical, surgical, family and social history reviewed Past Medical History:  Diagnosis Date  . Hyperlipidemia   . Hypertension   . Hyperuricemia   . Osteoporosis   . PAD (peripheral artery disease) ([place]) 02/2010   angiplasty, popliteal artery  . Tobacco abuse    Past Surgical History:  Procedure Laterality Date  . ANGIOPLASTY  July 2011   PAD- popliteal artery  . Leg Bypass Surgery Right 08/2010  . OVARIAN CYST SURGERY   1980s  . POPLITEAL ARTERY STENT Right 05/2010  . POPLITEAL ARTERY STENT Right 07/2010   Family History  Problem Relation Age of Onset  . Arthritis Mother   . Diabetes Mother   . Stroke Mother   . Hypertension Mother   . Cancer Father   . Arthritis Sister   . Seizures Sister   . Diabetes Sister   . Hypertension Sister   . COPD Sister   . Cancer Brother   . Stroke Maternal Grandmother   . Diabetes Paternal Grandmother   . Cancer Sister   . Diabetes Sister   . Diabetes Brother   . Diabetes Brother   . Heart disease Neg Hx    Social History   Socioeconomic History  . Marital status: Married    Spouse name: Not on file  . Number of children: Not on file  . Years of education: Not on file  . Highest education level: Not on file  Social Needs  . Financial resource strain: Not on file  . Food insecurity - worry: Not on file  . Food insecurity - inability: Not on file  . Transportation needs - medical: Not on file  . Transportation needs - non-medical: Not on file  Occupational History  . Not on file  Tobacco Use  . Smoking status: Current Every Day Smoker    Packs/day: 0.25    Types: Cigarettes  . Smokeless tobacco: Never Used  Substance and Sexual Activity  . Alcohol use: No  . Drug use: No  . Sexual activity: Not Currently  Other Topics Concern  . Not on file  Social History Narrative  . Not on file    Interim medical history since last visit reviewed. Allergies and medications reviewed  Review of Systems Per HPI unless specifically indicated above     Objective:    BP (!) 142/78   Pulse 87   Temp 97.6 F (36.4 C) (Oral)   Resp 14   Wt 125 lb 4.8 oz (56.8 kg)   SpO2 93%   BMI 21.51 kg/m   Wt Readings from Last 3 Encounters:  07/08/17 125 lb 4.8 oz (56.8 kg)  04/15/17 125 lb (56.7 kg)  11/13/16 133 lb (60.3 kg)    Physical Exam  Constitutional: She appears well-developed and well-nourished. No distress.  HENT:  Head: Normocephalic and  atraumatic.  Eyes: EOM are normal.  Neck: No thyromegaly present.  Cardiovascular: Normal rate and regular rhythm.  Pulmonary/Chest: Effort normal and breath sounds normal.    Knotty areas right breast inferior 6 and 7 o'clock on the RIGHT, and 8 o'clock on the LEFT  Abdominal: She exhibits no distension.  Musculoskeletal: She exhibits no edema.       Left hip: She exhibits decreased range of motion. She exhibits normal strength, no tenderness, no bony tenderness, no swelling, no crepitus and no deformity.  Neurological: She is alert.  No LE weakness  Skin: No pallor.  Psychiatric: She has a normal mood and affect. Her mood appears not anxious. She does not exhibit a depressed mood.   Results for orders placed or performed during the hospital encounter of 04/15/17  Protime-INR  Result Value Ref Range   Prothrombin Time 14.5 11.4 - 15.2 seconds   INR 1.12   CBC  Result Value Ref Range   WBC 7.0 3.6 - 11.0 K/uL   RBC 4.72 3.80 - 5.20 MIL/uL   Hemoglobin 14.1 12.0 - 16.0 g/dL   HCT 41.2 35.0 - 47.0 %   MCV 87.3 80.0 - 100.0 fL   MCH 29.9 26.0 - 34.0 pg   MCHC 34.2 32.0 - 36.0 g/dL   RDW 14.3 11.5 - 14.5 %   Platelets 309 150 - 440 K/uL      Assessment & Plan:   Problem List Items Addressed This Visit      Cardiovascular and Mediastinum   PAD (peripheral artery disease) ([place]) (Chronic)    Urged her to quit smoking; offered help; she is not ready to quit, she'll quit when she gets it in her mind; f/u with vascular doctor per his recommendations      Relevant Medications   amLODipine (NORVASC) 5 MG tablet   Hypertension (Chronic)    Refill meds; check creatinine; avoid salt; smoking cessation urged; try to follow DASH guidelines      Relevant Medications   amLODipine (NORVASC) 5 MG tablet     Other   Tobacco abuse (Chronic)    Urged cessation      Medication monitoring encounter    Check labs      Relevant Orders   COMPLETE METABOLIC PANEL WITH GFR    Lymphocytosis    Check CBC; urged smoking cessation      Relevant Orders   CBC with Differential/Platelet  Hyperuricemia (Chronic)    Check lab; avoid thiazides      Relevant Orders   Uric acid   Hyperlipidemia (Chronic)    Try to avoid fatty meats; check lipid panel today; continue statin      Relevant Medications   amLODipine (NORVASC) 5 MG tablet   Other Relevant Orders   Lipid panel   Hip pain    Patient to contact orthopaedist for pain medicine      Breast lump on right side at 7 o'clock position    diag mammo and [place]      Relevant Orders   MM DIAG BREAST TOMO BILATERAL   US BREAST LTD UNI LEFT INC AXILLA   US BREAST LTD UNI RIGHT INC AXILLA   Breast lump on left side at 8 o'clock position    diag mammo and [place]      Relevant Orders   MM DIAG BREAST TOMO BILATERAL   US BREAST LTD UNI LEFT INC AXILLA   US BREAST LTD UNI RIGHT INC AXILLA       Follow up plan: No Follow-up on file.  An after-visit summary was printed and given to the patient at [facility].  Please see the patient instructions which may contain other information and recommendations beyond what is mentioned above in the assessment and plan.  Meds ordered this encounter  Medications  . amLODipine (NORVASC) 5 MG tablet    Sig: Take 1 tablet (5 mg total) daily by mouth.    Dispense:  30 tablet    Refill:  11    Orders Placed This Encounter  Procedures  . MM DIAG BREAST TOMO BILATERAL  . US BREAST LTD UNI LEFT INC AXILLA  . US BREAST LTD UNI RIGHT INC AXILLA  . CBC with Differential/Platelet  . COMPLETE METABOLIC PANEL WITH GFR  . Lipid panel  . Uric acid    HM list: mammogram overdue; new mammo orders entered by MD; tetanus not covered by medicare; office manager okayed postponing for one year

## 2017-07-08 NOTE — Assessment & Plan Note (Signed)
Refill meds; check creatinine; avoid salt; smoking cessation urged; try to follow DASH guidelines

## 2017-07-08 NOTE — Assessment & Plan Note (Signed)
Check labs 

## 2017-07-08 NOTE — Assessment & Plan Note (Signed)
Check CBC; urged smoking cessation

## 2017-07-08 NOTE — Patient Instructions (Addendum)
Let's get labs today Try to follow the DASH guidelines (DASH stands for Dietary Approaches to Stop Hypertension) Try to limit the sodium in your diet.  Ideally, consume less than 1.5 grams (less than 1,500mg ) per day. Do not add salt when cooking or at the table.  Check the sodium amount on labels when shopping, and choose items lower in sodium when given a choice. Avoid or limit foods that already contain a lot of sodium. Eat a diet rich in fruits and vegetables and whole grains. I do encourage you to quit smoking Call [phone] to sign up for smoking cessation classes You can call 1-800-QUIT-NOW to talk with a smoking cessation coach  Health Risks of Smoking Smoking cigarettes is very bad for your health. Tobacco smoke has over 200 known poisons in it. It contains the poisonous gases nitrogen oxide and carbon monoxide. There are over 60 chemicals in tobacco smoke that cause cancer. Smoking is difficult to quit because a chemical in tobacco, called nicotine, causes addiction or dependence. When you smoke and inhale, nicotine is absorbed rapidly into the bloodstream through your lungs. Both inhaled and non-inhaled nicotine may be addictive. What are the risks of cigarette smoke? Cigarette smokers have an increased risk of many serious medical problems, including:  Lung cancer.  Lung disease, such as pneumonia, bronchitis, and emphysema.  Chest pain (angina) and heart attack because the heart is not getting enough oxygen.  Heart disease and peripheral blood vessel disease.  High blood pressure (hypertension).  Stroke.  Oral cancer, including cancer of the lip, mouth, or voice box.  Bladder cancer.  Pancreatic cancer.  Cervical cancer.  Pregnancy complications, including premature birth.  Stillbirths and smaller newborn babies, birth defects, and genetic damage to sperm.  Early menopause.  Lower estrogen level for women.  Infertility.  Facial  wrinkles.  Blindness.  Increased risk of broken bones (fractures).  Senile dementia.  Stomach ulcers and internal bleeding.  Delayed wound healing and increased risk of complications during surgery.  Even smoking lightly shortens your life expectancy by several years.  Because of secondhand smoke exposure, children of smokers have an increased risk of the following:  Sudden infant death syndrome (SIDS).  Respiratory infections.  Lung cancer.  Heart disease.  Ear infections.  What are the benefits of quitting? There are many health benefits of quitting smoking. Here are some of them:  Within days of quitting smoking, your risk of having a heart attack decreases, your blood flow improves, and your lung capacity improves. Blood pressure, pulse rate, and breathing patterns start returning to normal soon after quitting.  Within months, your lungs may clear up completely.  Quitting for 10 years reduces your risk of developing lung cancer and heart disease to almost that of a nonsmoker.  People who quit may see an improvement in their overall quality of life.  How do I quit smoking? Smoking is an addiction with both physical and psychological effects, and longtime habits can be hard to change. Your health care provider can recommend:  Programs and community resources, which may include group support, education, or talk therapy.  Prescription medicines to help reduce cravings.  Nicotine replacement products, such as patches, gum, and nasal sprays. Use these products only as directed. Do not replace cigarette smoking with electronic cigarettes, which are commonly called e-cigarettes. The safety of e-cigarettes is not known, and some may contain harmful chemicals.  A combination of two or more of these methods.  Where to find more information:  American Lung Association: www.lung.org  American Cancer Society: www.cancer.org Summary  Smoking cigarettes is very bad for your  health. Cigarette smokers have an increased risk of many serious medical problems, including several cancers, heart disease, and stroke.  Smoking is an addiction with both physical and psychological effects, and longtime habits can be hard to change.  By stopping right away, you can greatly reduce the risk of medical problems for you and your family.  To help you quit smoking, your health care provider can recommend programs, community resources, prescription medicines, and nicotine replacement products such as patches, gum, and nasal sprays. This information is not intended to replace advice given to you by your health care provider. Make sure you discuss any questions you have with your health care provider. Document Released: 09/24/2004 Document Revised: 08/21/2016 Document Reviewed: 08/21/2016 Elsevier Interactive Patient Education  2017 [employer].

## 2017-07-08 NOTE — Assessment & Plan Note (Signed)
Check lab; avoid thiazides

## 2017-07-08 NOTE — Assessment & Plan Note (Signed)
Urged her to quit smoking; offered help; she is not ready to quit, she'll quit when she gets it in her mind; f/u with vascular doctor per his recommendations

## 2017-07-08 NOTE — Assessment & Plan Note (Signed)
Patient to contact orthopaedist for pain medicine

## 2017-07-08 NOTE — Assessment & Plan Note (Signed)
Try to avoid fatty meats; check lipid panel today; continue statin

## 2017-07-08 NOTE — Assessment & Plan Note (Signed)
Urged cessation 

## 2017-07-12 ENCOUNTER — Telehealth: Payer: Self-pay

## 2017-07-12 NOTE — Telephone Encounter (Signed)
Called pt informed her of the information below per Dr.Lada's message. Pt gave verbal understanding.

## 2017-07-12 NOTE — Telephone Encounter (Signed)
-----   Message from Arnetha Courser, MD sent at 07/09/2017  9:29 AM EST ----- Guerry Minors, please let her know the results look very good overall; the only one I'd like to see improved is her uric acid; her level was 6.1 and the goal is less than 6; she is so close; I think she can get there with dietary changes; avoid or significantly limit [place], gravy, liver, red meat, shrimp, chicken soup, beef stock (if she is going to have soup, make sure it is made from vegetable stock and not chicken or beef stock); she can try tart cherry juice to help bring that down too; thank you

## 2017-09-09 DIAGNOSIS — M13852 Other specified arthritis, left hip: Secondary | ICD-10-CM | POA: Diagnosis not present

## 2017-09-27 ENCOUNTER — Ambulatory Visit (INDEPENDENT_AMBULATORY_CARE_PROVIDER_SITE_OTHER): Payer: Medicare HMO | Admitting: Family Medicine

## 2017-09-27 ENCOUNTER — Encounter: Payer: Self-pay | Admitting: Family Medicine

## 2017-09-27 VITALS — BP 136/84 | HR 98 | Temp 98.2°F | Resp 16 | Wt 124.2 lb

## 2017-09-27 DIAGNOSIS — Z720 Tobacco use: Secondary | ICD-10-CM | POA: Diagnosis not present

## 2017-09-27 DIAGNOSIS — N6324 Unspecified lump in the left breast, lower inner quadrant: Secondary | ICD-10-CM

## 2017-09-27 DIAGNOSIS — Z01818 Encounter for other preprocedural examination: Secondary | ICD-10-CM | POA: Diagnosis not present

## 2017-09-27 DIAGNOSIS — Z23 Encounter for immunization: Secondary | ICD-10-CM

## 2017-09-27 DIAGNOSIS — Z833 Family history of diabetes mellitus: Secondary | ICD-10-CM | POA: Diagnosis not present

## 2017-09-27 DIAGNOSIS — F17200 Nicotine dependence, unspecified, uncomplicated: Secondary | ICD-10-CM

## 2017-09-27 DIAGNOSIS — N6313 Unspecified lump in the right breast, lower outer quadrant: Secondary | ICD-10-CM | POA: Diagnosis not present

## 2017-09-27 NOTE — Progress Notes (Signed)
BP 136/84   Pulse 98   Temp 98.2 F (36.8 C) (Oral)   Resp 16   Wt 124 lb 3.2 oz (56.3 kg)   SpO2 96%   BMI 21.32 kg/m    Subjective:    Patient ID: Mary [name], female    DOB: 10-28-1950, 67 y.o.   MRN: [id number]  HPI: Mary [name] is a 67 y.o. female  Chief Complaint  Patient presents with  . Surgical Clearance    Feb. 27 Left hip replacement    HPI Patient is here for surgical clearance Having a left hip replacment on Feb 27th, under general anesthesia Dr. Sabra Heck, ortho Plans to rehab at a facility Prior surgeries, no problems with anesthesia; no problems with bleeding No boils or sores on the body; no hx of MRSA No chronic lung disease; smoking about 5 cigarettes a day; she is planning to quit after surgery She has tried to quit before, cold [place]; managed to stay quit about 2 years; picked back up with stress No lung or heart problems; able to walk without any pain or [facility] Not allergic to any antibiotics She never went to get the breast imaging  Depression screen [facility] 2/9 09/27/2017 07/08/2017 11/13/2016 11/14/2015  Decreased Interest 0 0 0 0  Down, Depressed, Hopeless 0 0 0 0  PHQ - 2 Score 0 0 0 0    Relevant past medical, surgical, family and social history reviewed Past Medical History:  Diagnosis Date  . Hyperlipidemia   . Hypertension   . Hyperuricemia   . Osteoporosis   . PAD (peripheral artery disease) ([place]) 02/2010   angiplasty, popliteal artery  . Tobacco abuse    Past Surgical History:  Procedure Laterality Date  . ANGIOPLASTY  July 2011   PAD- popliteal artery  . Leg Bypass Surgery Right 08/2010  . OVARIAN CYST SURGERY  1980s  . POPLITEAL ARTERY STENT Right 05/2010  . POPLITEAL ARTERY STENT Right 07/2010   Family History  Problem Relation Age of Onset  . Arthritis Mother   . Diabetes Mother   . Stroke Mother   . Hypertension Mother   . Cancer Father   . Arthritis Sister   . Seizures Sister   . Diabetes Sister   . Hypertension  Sister   . COPD Sister   . Cancer Brother   . Stroke Maternal Grandmother   . Diabetes Paternal Grandmother   . Cancer Sister   . Diabetes Sister   . Diabetes Brother   . Diabetes Brother   . Heart disease Neg Hx    Social History   Tobacco Use  . Smoking status: Current Every Day Smoker    Packs/day: 0.50    Types: Cigarettes  . Smokeless tobacco: Never Used  Substance Use Topics  . Alcohol use: No  . Drug use: No    Interim medical history since last visit reviewed. Allergies and medications reviewed  Review of Systems  Constitutional: Negative for fever.  HENT: Negative for nosebleeds.   Respiratory: Negative for cough and wheezing.   Cardiovascular: Negative for chest pain, palpitations and leg swelling.  Gastrointestinal: Negative for blood in stool.  Genitourinary: Negative for dysuria and hematuria.  Neurological: Negative for tremors.  Hematological: Does not bruise/bleed easily.   Per HPI unless specifically indicated above     Objective:    BP 136/84   Pulse 98   Temp 98.2 F (36.8 C) (Oral)   Resp 16   Wt 124 lb 3.2  oz (56.3 kg)   SpO2 96%   BMI 21.32 kg/m   Wt Readings from Last 3 Encounters:  09/27/17 124 lb 3.2 oz (56.3 kg)  07/08/17 125 lb 4.8 oz (56.8 kg)  04/15/17 125 lb (56.7 kg)    Physical Exam  Constitutional: She appears well-developed and well-nourished. No distress.  HENT:  Head: Normocephalic and atraumatic.  Eyes: EOM are normal. No scleral icterus.  Neck: No thyromegaly present.  Cardiovascular: Normal rate, regular rhythm and normal heart sounds.  No murmur heard. Pulmonary/Chest: Effort normal and breath sounds normal. No respiratory distress. She has no wheezes.  Abdominal: Soft. Bowel sounds are normal. She exhibits no distension.  Musculoskeletal: Normal range of motion. She exhibits no edema.  Neurological: She is alert. She exhibits normal muscle tone.  Skin: Skin is warm and dry. She is not diaphoretic. No pallor.    Psychiatric: She has a normal mood and affect. Her behavior is normal. Judgment and thought content normal.    Results for orders placed or performed in visit on 07/08/17  CBC with Differential/Platelet  Result Value Ref Range   WBC 7.4 3.8 - 10.8 Thousand/uL   RBC 4.68 3.80 - 5.10 Million/uL   Hemoglobin 14.0 11.7 - 15.5 g/dL   HCT 39.8 35.0 - 45.0 %   MCV 85.0 80.0 - 100.0 fL   MCH 29.9 27.0 - 33.0 pg   MCHC 35.2 32.0 - 36.0 g/dL   RDW 13.7 11.0 - 15.0 %   Platelets 323 140 - 400 Thousand/uL   MPV 9.4 7.5 - 12.5 fL   Neutro Abs 2,893 1,500 - 7,800 cells/uL   Lymphs Abs 3,700 850 - 3,900 cells/uL   WBC mixed population 629 200 - 950 cells/uL   Eosinophils Absolute 148 15 - 500 cells/uL   Basophils Absolute 30 0 - 200 cells/uL   Neutrophils Relative % 39.1 %   Total Lymphocyte 50.0 %   Monocytes Relative 8.5 %   Eosinophils Relative 2.0 %   Basophils Relative 0.4 %  COMPLETE METABOLIC PANEL WITH GFR  Result Value Ref Range   Glucose, Bld 77 65 - 99 mg/dL   BUN 6 (L) 7 - 25 mg/dL   Creat 0.60 0.50 - 0.99 mg/dL   GFR, Est Non African American 96 > OR = 60 mL/min/1.75m2   GFR, Est African American 112 > OR = 60 mL/min/1.59m2   BUN/Creatinine Ratio 10 6 - 22 (calc)   Sodium 141 135 - 146 mmol/L   Potassium 3.7 3.5 - 5.3 mmol/L   Chloride 102 98 - 110 mmol/L   CO2 31 20 - 32 mmol/L   Calcium 9.4 8.6 - 10.4 mg/dL   Total Protein 7.2 6.1 - 8.1 g/dL   Albumin 4.2 3.6 - 5.1 g/dL   Globulin 3.0 1.9 - 3.7 g/dL (calc)   AG Ratio 1.4 1.0 - 2.5 (calc)   Total Bilirubin 0.4 0.2 - 1.2 mg/dL   Alkaline phosphatase (APISO) 87 33 - 130 U/L   AST 17 10 - 35 U/L   ALT 8 6 - 29 U/L  Lipid panel  Result Value Ref Range   Cholesterol 129 <200 mg/dL   HDL 46 (L) >50 mg/dL   Triglycerides 129 <150 mg/dL   LDL Cholesterol (Calc) 62 mg/dL (calc)   Total CHOL/HDL Ratio 2.8 <5.0 (calc)   Non-HDL Cholesterol (Calc) 83 <130 mg/dL (calc)  Uric acid  Result Value Ref Range   Uric Acid, Serum  6.1 2.5 - 7.0 mg/dL  Assessment & Plan:   Problem List Items Addressed This Visit      Other   Tobacco abuse (Chronic)    Encouraged her to quit now (at least 4 weeks prior to surgery) or after surgery; data shows risk a little higher if quitting immediately before surgery; see AVS      Breast lump on right side at 7 o'clock position    Urged patient to get the testing done ordered in Nov      Breast lump on left side at 8 o'clock position    Urged pt to get the testing done ordered in Nov       Other Visit Diagnoses    Pre-op testing    -  Primary   Relevant Orders   EKG 12-Lead   COMPLETE METABOLIC PANEL WITH GFR   CBC with Differential/Platelet   Hemoglobin A1C   Urinalysis, Routine w reflex microscopic   DG Chest 2 View   Smoker       Relevant Orders   DG Chest 2 View       Follow up plan: Return for CPE.  An after-visit summary was printed and given to the patient at [facility].  Please see the patient instructions which may contain other information and recommendations beyond what is mentioned above in the assessment and plan.  No orders of the defined types were placed in this encounter.   Orders Placed This Encounter  Procedures  . DG Chest 2 View  . COMPLETE METABOLIC PANEL WITH GFR  . CBC with Differential/Platelet  . Hemoglobin A1C  . Urinalysis, Routine w reflex microscopic  . EKG 12-Lead

## 2017-09-27 NOTE — Assessment & Plan Note (Signed)
Urged patient to get the testing done ordered in Nov

## 2017-09-27 NOTE — Patient Instructions (Addendum)
I do encourage you to quit smoking Call [phone] to sign up for smoking cessation classes You can call 1-800-QUIT-NOW to talk with a smoking cessation coach We'll get the breast imaging scheduled and labs drawn today Please have the chest xray done across the street If you have not heard anything from my staff in a week about any orders/referrals/studies from today, please contact us here to follow-up [phone]    Steps to Quit Smoking Smoking tobacco can be bad for your health. It can also affect almost every organ in your body. Smoking puts you and people around you at risk for many serious long-lasting (chronic) diseases. Quitting smoking is hard, but it is one of the best things that you can do for your health. It is never too late to quit. What are the benefits of quitting smoking? When you quit smoking, you lower your risk for getting serious diseases and conditions. They can include:  Lung cancer or lung disease.  Heart disease.  Stroke.  Heart attack.  Not being able to have children (infertility).  Weak bones (osteoporosis) and broken bones (fractures).  If you have coughing, wheezing, and shortness of breath, those symptoms may get better when you quit. You may also get sick less often. If you are pregnant, quitting smoking can help to lower your chances of having a baby of low birth weight. What can I do to help me quit smoking? Talk with your doctor about what can help you quit smoking. Some things you can do (strategies) include:  Quitting smoking totally, instead of slowly cutting back how much you smoke over a period of time.  Going to in-person counseling. You are more likely to quit if you go to many counseling sessions.  Using resources and support systems, such as: ? Database administrator with a Social worker. ? Phone quitlines. ? Careers information officer. ? Support groups or group counseling. ? Text messaging programs. ? Mobile phone apps or  applications.  Taking medicines. Some of these medicines may have nicotine in them. If you are pregnant or breastfeeding, do not take any medicines to quit smoking unless your doctor says it is okay. Talk with your doctor about counseling or other things that can help you.  Talk with your doctor about using more than one strategy at the same time, such as taking medicines while you are also going to in-person counseling. This can help make quitting easier. What things can I do to make it easier to quit? Quitting smoking might feel very hard at first, but there is a lot that you can do to make it easier. Take these steps:  Talk to your family and friends. Ask them to support and encourage you.  Call phone quitlines, reach out to support groups, or work with a Social worker.  Ask people who smoke to not smoke around you.  Avoid places that make you want (trigger) to smoke, such as: ? Bars. ? Parties. ? Smoke-break areas at work.  Spend time with people who do not smoke.  Lower the stress in your life. Stress can make you want to smoke. Try these things to help your stress: ? Getting regular exercise. ? Deep-breathing exercises. ? Yoga. ? Meditating. ? Doing a body scan. To do this, close your eyes, focus on one area of your body at a time from head to toe, and notice which parts of your body are tense. Try to relax the muscles in those areas.  Download or buy apps on your  mobile phone or tablet that can help you stick to your quit plan. There are many free apps, such as QuitGuide from the [employer] Office manager for Disease Control and Prevention). You can find more support from smokefree.gov and other websites.  This information is not intended to replace advice given to you by your health care provider. Make sure you discuss any questions you have with your health care provider. Document Released: 06/13/2009 Document Revised: 04/14/2016 Document Reviewed: 01/01/2015 Elsevier Interactive Patient  Education  2018 [employer].

## 2017-09-27 NOTE — Assessment & Plan Note (Signed)
Encouraged her to quit now (at least 4 weeks prior to surgery) or after surgery; data shows risk a little higher if quitting immediately before surgery; see AVS

## 2017-09-27 NOTE — Addendum Note (Signed)
Addended by: LADA, Satira Anis on: 09/27/2017 02:57 PM   Modules accepted: Orders

## 2017-09-27 NOTE — Assessment & Plan Note (Signed)
Urged pt to get the testing done ordered in Nov

## 2017-09-27 NOTE — Addendum Note (Signed)
Addended by: Cathrine Muster C on: 09/27/2017 11:06 AM   Modules accepted: Orders

## 2017-09-28 ENCOUNTER — Telehealth: Payer: Self-pay

## 2017-09-28 LAB — CBC WITH DIFFERENTIAL/PLATELET
Basophils Absolute: 39 cells/uL (ref 0–200)
Basophils Relative: 0.6 %
Eosinophils Absolute: 182 cells/uL (ref 15–500)
Eosinophils Relative: 2.8 %
HCT: 40.9 % (ref 35.0–45.0)
Hemoglobin: 14.4 g/dL (ref 11.7–15.5)
Lymphs Abs: 3491 cells/uL (ref 850–3900)
MCH: 30.4 pg (ref 27.0–33.0)
MCHC: 35.2 g/dL (ref 32.0–36.0)
MCV: 86.3 fL (ref 80.0–100.0)
MPV: 9.4 fL (ref 7.5–12.5)
Monocytes Relative: 9.5 %
Neutro Abs: 2171 cells/uL (ref 1500–7800)
Neutrophils Relative %: 33.4 %
Platelets: 308 10*3/uL (ref 140–400)
RBC: 4.74 10*6/uL (ref 3.80–5.10)
RDW: 13.2 % (ref 11.0–15.0)
Total Lymphocyte: 53.7 %
WBC mixed population: 618 cells/uL (ref 200–950)
WBC: 6.5 10*3/uL (ref 3.8–10.8)

## 2017-09-28 LAB — URINALYSIS, ROUTINE W REFLEX MICROSCOPIC
Bilirubin Urine: NEGATIVE
Glucose, UA: NEGATIVE
Hgb urine dipstick: NEGATIVE
Ketones, ur: NEGATIVE
Leukocytes, UA: NEGATIVE
Nitrite: NEGATIVE
Protein, ur: NEGATIVE
Specific Gravity, Urine: 1.014 (ref 1.001–1.03)
pH: 7 (ref 5.0–8.0)

## 2017-09-28 LAB — COMPLETE METABOLIC PANEL WITH GFR
AG Ratio: 1.3 (calc) (ref 1.0–2.5)
ALT: 11 U/L (ref 6–29)
AST: 17 U/L (ref 10–35)
Albumin: 4.1 g/dL (ref 3.6–5.1)
Alkaline phosphatase (APISO): 90 U/L (ref 33–130)
BUN: 7 mg/dL (ref 7–25)
CO2: 30 mmol/L (ref 20–32)
Calcium: 9.5 mg/dL (ref 8.6–10.4)
Chloride: 103 mmol/L (ref 98–110)
Creat: 0.74 mg/dL (ref 0.50–0.99)
GFR, Est African American: 99 mL/min/{1.73_m2} (ref >=60)
GFR, Est Non African American: 86 mL/min/{1.73_m2} (ref >=60)
Globulin: 3.2 g/dL (calc) (ref 1.9–3.7)
Glucose, Bld: 87 mg/dL (ref 65–99)
Potassium: 4 mmol/L (ref 3.5–5.3)
Sodium: 142 mmol/L (ref 135–146)
Total Bilirubin: 0.4 mg/dL (ref 0.2–1.2)
Total Protein: 7.3 g/dL (ref 6.1–8.1)

## 2017-09-28 NOTE — Telephone Encounter (Signed)
-----   Message from Arnetha Courser, MD sent at 09/28/2017 11:25 AM EST ----- Please let the patient know that all of her labs look great; thank you

## 2017-09-28 NOTE — Telephone Encounter (Signed)
Called pt informed her of normal labs. Pt gave verbal understanding.

## 2017-09-29 ENCOUNTER — Other Ambulatory Visit: Payer: Self-pay | Admitting: Specialist

## 2017-09-30 ENCOUNTER — Ambulatory Visit
Admission: RE | Admit: 2017-09-30 | Discharge: 2017-09-30 | Disposition: A | Discharge status: Home / Self Care | Payer: Medicare HMO | Source: Ambulatory Visit | Attending: Family Medicine | Admitting: Family Medicine

## 2017-09-30 DIAGNOSIS — R922 Inconclusive mammogram: Secondary | ICD-10-CM | POA: Diagnosis not present

## 2017-09-30 DIAGNOSIS — N6314 Unspecified lump in the right breast, lower inner quadrant: Secondary | ICD-10-CM | POA: Diagnosis not present

## 2017-09-30 DIAGNOSIS — N6313 Unspecified lump in the right breast, lower outer quadrant: Secondary | ICD-10-CM

## 2017-09-30 DIAGNOSIS — R928 Other abnormal and inconclusive findings on diagnostic imaging of breast: Secondary | ICD-10-CM | POA: Insufficient documentation

## 2017-09-30 DIAGNOSIS — N6324 Unspecified lump in the left breast, lower inner quadrant: Secondary | ICD-10-CM

## 2017-10-11 ENCOUNTER — Other Ambulatory Visit (INDEPENDENT_AMBULATORY_CARE_PROVIDER_SITE_OTHER): Payer: Self-pay | Admitting: Vascular Surgery

## 2017-10-11 DIAGNOSIS — M1612 Unilateral primary osteoarthritis, left hip: Secondary | ICD-10-CM | POA: Diagnosis not present

## 2017-10-11 DIAGNOSIS — M25552 Pain in left hip: Secondary | ICD-10-CM | POA: Diagnosis not present

## 2017-10-11 DIAGNOSIS — Z01818 Encounter for other preprocedural examination: Secondary | ICD-10-CM | POA: Diagnosis not present

## 2017-10-11 DIAGNOSIS — M13852 Other specified arthritis, left hip: Secondary | ICD-10-CM | POA: Diagnosis not present

## 2017-10-13 ENCOUNTER — Encounter
Admission: RE | Admit: 2017-10-13 | Discharge: 2017-10-13 | Disposition: A | Discharge status: Home / Self Care | Payer: Medicare HMO | Source: Ambulatory Visit | Attending: Specialist | Admitting: Specialist

## 2017-10-13 ENCOUNTER — Other Ambulatory Visit: Payer: Self-pay

## 2017-10-13 DIAGNOSIS — Z0183 Encounter for blood typing: Secondary | ICD-10-CM | POA: Diagnosis not present

## 2017-10-13 DIAGNOSIS — Z01812 Encounter for preprocedural laboratory examination: Secondary | ICD-10-CM | POA: Diagnosis not present

## 2017-10-13 LAB — COMPREHENSIVE METABOLIC PANEL
ALT: 15 U/L (ref 14–54)
AST: 22 U/L (ref 15–41)
Albumin: 4.1 g/dL (ref 3.5–5.0)
Alkaline Phosphatase: 85 U/L (ref 38–126)
Anion gap: 8 (ref 5–15)
BUN: 8 mg/dL (ref 6–20)
CO2: 31 mmol/L (ref 22–32)
Calcium: 9.2 mg/dL (ref 8.9–10.3)
Chloride: 99 mmol/L — ABNORMAL LOW (ref 101–111)
Creatinine, Ser: 0.7 mg/dL (ref 0.44–1.00)
GFR calc Af Amer: >60 mL/min (ref >60)
GFR calc non Af Amer: >60 mL/min (ref >60)
Glucose, Bld: 96 mg/dL (ref 65–99)
Potassium: 3.3 mmol/L — ABNORMAL LOW (ref 3.5–5.1)
Sodium: 138 mmol/L (ref 135–145)
Total Bilirubin: 0.5 mg/dL (ref 0.3–1.2)
Total Protein: 8.2 g/dL — ABNORMAL HIGH (ref 6.5–8.1)

## 2017-10-13 LAB — CBC WITH DIFFERENTIAL/PLATELET
Basophils Absolute: 0 10*3/uL (ref 0–0.1)
Basophils Relative: 1 %
Eosinophils Absolute: 0.1 10*3/uL (ref 0–0.7)
Eosinophils Relative: 2 %
HCT: 41 % (ref 35.0–47.0)
Hemoglobin: 13.8 g/dL (ref 12.0–16.0)
Lymphocytes Relative: 52 %
Lymphs Abs: 4.1 10*3/uL — ABNORMAL HIGH (ref 1.0–3.6)
MCH: 29.9 pg (ref 26.0–34.0)
MCHC: 33.6 g/dL (ref 32.0–36.0)
MCV: 89.1 fL (ref 80.0–100.0)
Monocytes Absolute: 0.5 10*3/uL (ref 0.2–0.9)
Monocytes Relative: 7 %
Neutro Abs: 3 10*3/uL (ref 1.4–6.5)
Neutrophils Relative %: 38 %
Platelets: 316 10*3/uL (ref 150–440)
RBC: 4.6 MIL/uL (ref 3.80–5.20)
RDW: 13.8 % (ref 11.5–14.5)
WBC: 7.8 10*3/uL (ref 3.6–11.0)

## 2017-10-13 LAB — URINALYSIS, ROUTINE W REFLEX MICROSCOPIC
Bacteria, UA: NONE SEEN
Bilirubin Urine: NEGATIVE
Glucose, UA: NEGATIVE mg/dL
Hgb urine dipstick: NEGATIVE
Ketones, ur: NEGATIVE mg/dL
Leukocytes, UA: NEGATIVE
Nitrite: NEGATIVE
Protein, ur: 30 mg/dL — AB
Specific Gravity, Urine: 1.02 (ref 1.005–1.030)
pH: 5 (ref 5.0–8.0)

## 2017-10-13 LAB — PROTIME-INR
INR: 1.34
Prothrombin Time: 16.5 seconds — ABNORMAL HIGH (ref 11.4–15.2)

## 2017-10-13 LAB — TYPE AND SCREEN
ABO/RH(D): A POS
Antibody Screen: NEGATIVE

## 2017-10-13 LAB — SURGICAL PCR SCREEN
MRSA, PCR: NEGATIVE
Staphylococcus aureus: NEGATIVE

## 2017-10-13 LAB — HEMOGLOBIN A1C
Hgb A1c MFr Bld: 6.2 % — ABNORMAL HIGH (ref 4.8–5.6)
Mean Plasma Glucose: 131.24 mg/dL

## 2017-10-13 NOTE — Patient Instructions (Signed)
Your procedure is scheduled on: Wednesday, October 27, 2017 Report to Same Day Surgery on the 2nd floor in the [facility]. To find out your arrival time, please call [phone] between 1PM - 3PM on: Tuesday, October 26, 2017  REMEMBER: Instructions that are not followed completely may result in serious medical risk, up to and including death; or upon the discretion of your surgeon and anesthesiologist your surgery may need to be rescheduled.  Do not eat food after midnight the night before your procedure.  No gum chewing or hard candies.  You may however, drink CLEAR liquids up to 2 hours before you are scheduled to arrive at the hospital for your procedure.  Do not drink clear liquids within 2 hours of the start of your surgery.  Clear liquids include: - water  - apple juice without pulp - clear gatorade - black coffee or tea (Do NOT add anything to the coffee or tea) Do NOT drink anything that is not on this list.  No Alcohol for 24 hours before or after surgery.  No Smoking including e-cigarettes for 24 hours prior to surgery. No chewable tobacco products for at least 6 hours prior to surgery. No nicotine patches on the day of surgery.  On the morning of surgery brush your teeth with toothpaste and water, you may rinse your mouth with mouthwash if you wish. Do not swallow any  toothpaste of mouthwash.  Notify your doctor if there is any change in your medical condition (cold, fever, infection).  Do not wear jewelry, make-up, hairpins, clips or nail polish.  Do not wear lotions, powders, or perfumes. You may wear deodorant.  Do not shave 48 hours prior to surgery.   Contacts and dentures may not be worn into surgery.  Do not bring valuables to the hospital. [facility] is not responsible for any belongings or valuables.  TAKE THESE MEDICATIONS THE MORNING OF SURGERY WITH A SIP OF WATER:  1.  Amlodipine  Use CHG Soap as directed on instruction sheet.  Follow  recommendations from PCP regarding stopping Coumadin.  (last dose on Saturday, February 23); resume 1 day after surgery.  On February 20 - Stop Anti-inflammatories such as Advil, Aleve, Ibuprofen, Motrin, Naproxen, Naprosyn, Goodie powder, or aspirin products. (May take Tylenol or Acetaminophen if needed.)  On February 20 - Stop ANY OVER THE COUNTER supplements until after surgery. (vitamin E.)  If you are being admitted to the hospital overnight, leave your suitcase in the car. After surgery it may be brought to your room.  If you are taking public transportation, you will need to have a responsible adult to with you.  Please call the number above if you have any questions about these instructions.

## 2017-10-18 ENCOUNTER — Telehealth: Payer: Self-pay | Admitting: Family Medicine

## 2017-10-18 DIAGNOSIS — E876 Hypokalemia: Secondary | ICD-10-CM

## 2017-10-18 NOTE — Telephone Encounter (Signed)
Note received from surgeon Patient needs clearance prior to surgery b/c of low potassium Please ask her to come in this Thursday for recheck (I've ordered lab) Thank you

## 2017-10-18 NOTE — Assessment & Plan Note (Signed)
Check K+ and Mg2+ 

## 2017-10-18 NOTE — Telephone Encounter (Signed)
Pt.notified

## 2017-10-21 ENCOUNTER — Telehealth: Payer: Self-pay | Admitting: Family Medicine

## 2017-10-21 DIAGNOSIS — E876 Hypokalemia: Secondary | ICD-10-CM | POA: Diagnosis not present

## 2017-10-21 NOTE — Telephone Encounter (Signed)
Thank you :)

## 2017-10-21 NOTE — Telephone Encounter (Signed)
FYI: Pt is having hip surgery on this coming Tuesday. She wanted to inform you that Dr Sabra Heck put her on POTASSIUM CHLORIDE CAP ER 10 ([place] [facility] CAP PAD).

## 2017-10-22 LAB — BASIC METABOLIC PANEL
BUN: 8 mg/dL (ref 7–25)
CO2: 29 mmol/L (ref 20–32)
Calcium: 9.2 mg/dL (ref 8.6–10.4)
Chloride: 103 mmol/L (ref 98–110)
Creat: 0.66 mg/dL (ref 0.50–0.99)
Glucose, Bld: 83 mg/dL (ref 65–99)
Potassium: 3.5 mmol/L (ref 3.5–5.3)
Sodium: 140 mmol/L (ref 135–146)

## 2017-10-22 LAB — MAGNESIUM: Magnesium: 1.8 mg/dL (ref 1.5–2.5)

## 2017-10-26 MED ORDER — VANCOMYCIN HCL IN DEXTROSE 1-5 GM/200ML-% IV SOLN
1000.0000 mg | INTRAVENOUS | Status: AC
  Administered 2017-10-27: 1000 mg via INTRAVENOUS

## 2017-10-27 ENCOUNTER — Encounter
Admission: RE | Disposition: A | Discharge status: Home Health | Payer: Self-pay | Source: Ambulatory Visit | Attending: Specialist

## 2017-10-27 ENCOUNTER — Inpatient Hospital Stay
Admission: RE | Admit: 2017-10-27 | Discharge: 2017-10-31 | DRG: 470 | Disposition: A | Discharge status: Home Health | Payer: Medicare HMO | Source: Ambulatory Visit | Attending: Specialist | Admitting: Specialist

## 2017-10-27 ENCOUNTER — Inpatient Hospital Stay: Payer: Medicare HMO | Admitting: Certified Registered"

## 2017-10-27 ENCOUNTER — Inpatient Hospital Stay: Payer: Medicare HMO

## 2017-10-27 ENCOUNTER — Other Ambulatory Visit: Payer: Self-pay

## 2017-10-27 DIAGNOSIS — Z825 Family history of asthma and other chronic lower respiratory diseases: Secondary | ICD-10-CM

## 2017-10-27 DIAGNOSIS — E785 Hyperlipidemia, unspecified: Secondary | ICD-10-CM | POA: Diagnosis not present

## 2017-10-27 DIAGNOSIS — I951 Orthostatic hypotension: Secondary | ICD-10-CM | POA: Diagnosis not present

## 2017-10-27 DIAGNOSIS — I739 Peripheral vascular disease, unspecified: Secondary | ICD-10-CM | POA: Diagnosis not present

## 2017-10-27 DIAGNOSIS — F17210 Nicotine dependence, cigarettes, uncomplicated: Secondary | ICD-10-CM | POA: Diagnosis present

## 2017-10-27 DIAGNOSIS — M810 Age-related osteoporosis without current pathological fracture: Secondary | ICD-10-CM | POA: Diagnosis present

## 2017-10-27 DIAGNOSIS — Z96642 Presence of left artificial hip joint: Secondary | ICD-10-CM | POA: Diagnosis not present

## 2017-10-27 DIAGNOSIS — M12852 Other specific arthropathies, not elsewhere classified, left hip: Secondary | ICD-10-CM | POA: Diagnosis not present

## 2017-10-27 DIAGNOSIS — K08409 Partial loss of teeth, unspecified cause, unspecified class: Secondary | ICD-10-CM | POA: Diagnosis present

## 2017-10-27 DIAGNOSIS — Z823 Family history of stroke: Secondary | ICD-10-CM

## 2017-10-27 DIAGNOSIS — Z833 Family history of diabetes mellitus: Secondary | ICD-10-CM | POA: Diagnosis not present

## 2017-10-27 DIAGNOSIS — Z95828 Presence of other vascular implants and grafts: Secondary | ICD-10-CM | POA: Diagnosis not present

## 2017-10-27 DIAGNOSIS — M1612 Unilateral primary osteoarthritis, left hip: Secondary | ICD-10-CM | POA: Diagnosis not present

## 2017-10-27 DIAGNOSIS — G629 Polyneuropathy, unspecified: Secondary | ICD-10-CM | POA: Diagnosis present

## 2017-10-27 DIAGNOSIS — I10 Essential (primary) hypertension: Secondary | ICD-10-CM | POA: Diagnosis present

## 2017-10-27 DIAGNOSIS — Z8249 Family history of ischemic heart disease and other diseases of the circulatory system: Secondary | ICD-10-CM | POA: Diagnosis not present

## 2017-10-27 DIAGNOSIS — Z7901 Long term (current) use of anticoagulants: Secondary | ICD-10-CM | POA: Diagnosis not present

## 2017-10-27 DIAGNOSIS — Z79899 Other long term (current) drug therapy: Secondary | ICD-10-CM

## 2017-10-27 DIAGNOSIS — M13852 Other specified arthritis, left hip: Secondary | ICD-10-CM | POA: Diagnosis not present

## 2017-10-27 DIAGNOSIS — K0889 Other specified disorders of teeth and supporting structures: Secondary | ICD-10-CM | POA: Diagnosis present

## 2017-10-27 DIAGNOSIS — Z8261 Family history of arthritis: Secondary | ICD-10-CM | POA: Diagnosis not present

## 2017-10-27 DIAGNOSIS — M87852 Other osteonecrosis, left femur: Secondary | ICD-10-CM | POA: Diagnosis not present

## 2017-10-27 DIAGNOSIS — R509 Fever, unspecified: Secondary | ICD-10-CM

## 2017-10-27 DIAGNOSIS — M1610 Unilateral primary osteoarthritis, unspecified hip: Secondary | ICD-10-CM | POA: Diagnosis present

## 2017-10-27 HISTORY — DX: Unspecified osteoarthritis, unspecified site: M19.90

## 2017-10-27 HISTORY — PX: TOTAL HIP ARTHROPLASTY: SHX124

## 2017-10-27 LAB — CBC
HCT: 41.4 % (ref 35.0–47.0)
Hemoglobin: 13.5 g/dL (ref 12.0–16.0)
MCH: 29.4 pg (ref 26.0–34.0)
MCHC: 32.5 g/dL (ref 32.0–36.0)
MCV: 90.4 fL (ref 80.0–100.0)
Platelets: 269 10*3/uL (ref 150–440)
RBC: 4.58 MIL/uL (ref 3.80–5.20)
RDW: 13.9 % (ref 11.5–14.5)
WBC: 12.5 10*3/uL — ABNORMAL HIGH (ref 3.6–11.0)

## 2017-10-27 LAB — POCT I-STAT 4, (NA,K, GLUC, HGB,HCT)
Glucose, Bld: 98 mg/dL (ref 65–99)
HCT: 43 % (ref 36.0–46.0)
Hemoglobin: 14.6 g/dL (ref 12.0–15.0)
Potassium: 3.9 mmol/L (ref 3.5–5.1)
Sodium: 144 mmol/L (ref 135–145)

## 2017-10-27 LAB — PROTIME-INR
INR: 1.19
Prothrombin Time: 15 seconds (ref 11.4–15.2)

## 2017-10-27 LAB — CREATININE, SERUM
Creatinine, Ser: 0.77 mg/dL (ref 0.44–1.00)
GFR calc Af Amer: >60 mL/min (ref >60)
GFR calc non Af Amer: >60 mL/min (ref >60)

## 2017-10-27 LAB — ABO/RH: ABO/RH(D): A POS

## 2017-10-27 SURGERY — ARTHROPLASTY, HIP, TOTAL,POSTERIOR APPROACH
Anesthesia: General | Site: Hip | Laterality: Left | Wound class: Clean

## 2017-10-27 MED ORDER — ACETAMINOPHEN 325 MG PO TABS
650.0000 mg | ORAL_TABLET | Freq: Four times a day (QID) | ORAL | Status: DC | PRN
  Administered 2017-10-28 – 2017-10-29 (×2): 650 mg via ORAL
  Filled 2017-10-27 (×2): qty 2

## 2017-10-27 MED ORDER — METOCLOPRAMIDE HCL 5 MG/ML IJ SOLN: 5.0000 mg | Freq: Three times a day (TID) | INTRAMUSCULAR | Status: DC | PRN

## 2017-10-27 MED ORDER — FERROUS SULFATE 325 (65 FE) MG PO TABS
325.0000 mg | ORAL_TABLET | Freq: Every day | ORAL | Status: DC
  Administered 2017-10-28 – 2017-10-31 (×4): 325 mg via ORAL
  Filled 2017-10-27 (×4): qty 1

## 2017-10-27 MED ORDER — MAGNESIUM HYDROXIDE 400 MG/5ML PO SUSP
30.0000 mL | Freq: Every day | ORAL | Status: DC | PRN
  Administered 2017-10-28 – 2017-10-29 (×2): 30 mL via ORAL
  Filled 2017-10-27 (×2): qty 30

## 2017-10-27 MED ORDER — ONDANSETRON HCL 4 MG/2ML IJ SOLN
INTRAMUSCULAR | Status: AC
  Filled 2017-10-27: qty 2

## 2017-10-27 MED ORDER — FENTANYL CITRATE (PF) 100 MCG/2ML IJ SOLN: 25.0000 ug | INTRAMUSCULAR | Status: DC | PRN

## 2017-10-27 MED ORDER — HYDROCODONE-ACETAMINOPHEN 5-325 MG PO TABS: 2.0000 | ORAL_TABLET | ORAL | Status: DC | PRN

## 2017-10-27 MED ORDER — DOCUSATE SODIUM 100 MG PO CAPS
100.0000 mg | ORAL_CAPSULE | Freq: Two times a day (BID) | ORAL | Status: DC
  Administered 2017-10-27 – 2017-10-29 (×5): 100 mg via ORAL
  Filled 2017-10-27 (×6): qty 1

## 2017-10-27 MED ORDER — WARFARIN SODIUM 5 MG PO TABS
5.0000 mg | ORAL_TABLET | ORAL | Status: DC
  Administered 2017-10-27 – 2017-10-31 (×3): 5 mg via ORAL
  Filled 2017-10-27 (×3): qty 1

## 2017-10-27 MED ORDER — SUGAMMADEX SODIUM 200 MG/2ML IV SOLN
INTRAVENOUS | Status: DC | PRN
  Administered 2017-10-27: 150 mg via INTRAVENOUS

## 2017-10-27 MED ORDER — ONDANSETRON HCL 4 MG/2ML IJ SOLN: 4.0000 mg | Freq: Four times a day (QID) | INTRAMUSCULAR | Status: DC | PRN

## 2017-10-27 MED ORDER — ROCURONIUM BROMIDE 50 MG/5ML IV SOLN
INTRAVENOUS | Status: AC
  Filled 2017-10-27: qty 1

## 2017-10-27 MED ORDER — DEXAMETHASONE SODIUM PHOSPHATE 10 MG/ML IJ SOLN
INTRAMUSCULAR | Status: AC
  Filled 2017-10-27: qty 1

## 2017-10-27 MED ORDER — GLYCOPYRROLATE 0.2 MG/ML IJ SOLN
INTRAMUSCULAR | Status: AC
  Filled 2017-10-27: qty 1

## 2017-10-27 MED ORDER — SODIUM CHLORIDE 0.9 % IV SOLN
INTRAVENOUS | Status: DC | PRN
  Administered 2017-10-27: 60 mL

## 2017-10-27 MED ORDER — GABAPENTIN 300 MG PO CAPS
300.0000 mg | ORAL_CAPSULE | Freq: Two times a day (BID) | ORAL | Status: DC
  Administered 2017-10-27 – 2017-10-31 (×8): 300 mg via ORAL
  Filled 2017-10-27 (×8): qty 1

## 2017-10-27 MED ORDER — FENTANYL CITRATE (PF) 100 MCG/2ML IJ SOLN
INTRAMUSCULAR | Status: DC | PRN
  Administered 2017-10-27: 50 ug via INTRAVENOUS

## 2017-10-27 MED ORDER — PROPOFOL 10 MG/ML IV BOLUS
INTRAVENOUS | Status: AC
  Filled 2017-10-27: qty 20

## 2017-10-27 MED ORDER — WARFARIN SODIUM 5 MG PO TABS: 5.0000 mg | ORAL_TABLET | ORAL | Status: DC

## 2017-10-27 MED ORDER — WARFARIN - PHYSICIAN DOSING INPATIENT
Freq: Every day | Status: DC
  Administered 2017-10-27: 20:00:00

## 2017-10-27 MED ORDER — ACETAMINOPHEN 10 MG/ML IV SOLN
INTRAVENOUS | Status: DC | PRN
  Administered 2017-10-27: 1000 mg via INTRAVENOUS

## 2017-10-27 MED ORDER — LIDOCAINE HCL (PF) 2 % IJ SOLN
INTRAMUSCULAR | Status: AC
  Filled 2017-10-27: qty 10

## 2017-10-27 MED ORDER — FAMOTIDINE 20 MG PO TABS
20.0000 mg | ORAL_TABLET | Freq: Once | ORAL | Status: AC
  Administered 2017-10-27: 20 mg via ORAL

## 2017-10-27 MED ORDER — SODIUM CHLORIDE 0.9 % IV SOLN
INTRAVENOUS | Status: DC | PRN
  Administered 2017-10-27: 1000 mg via INTRAVENOUS

## 2017-10-27 MED ORDER — NALOXONE HCL 0.4 MG/ML IJ SOLN
INTRAMUSCULAR | Status: DC | PRN
  Administered 2017-10-27: 200 ug via INTRAVENOUS

## 2017-10-27 MED ORDER — TRANEXAMIC ACID 1000 MG/10ML IV SOLN
1000.0000 mg | Freq: Once | INTRAVENOUS | Status: AC
  Administered 2017-10-27: 1000 mg via INTRAVENOUS
  Filled 2017-10-27: qty 10

## 2017-10-27 MED ORDER — CHLORHEXIDINE GLUCONATE CLOTH 2 % EX PADS: 6.0000 | MEDICATED_PAD | Freq: Once | CUTANEOUS | Status: DC

## 2017-10-27 MED ORDER — MIDAZOLAM HCL 2 MG/2ML IJ SOLN
INTRAMUSCULAR | Status: DC | PRN
  Administered 2017-10-27: 2 mg via INTRAVENOUS

## 2017-10-27 MED ORDER — GABAPENTIN 300 MG PO CAPS
300.0000 mg | ORAL_CAPSULE | ORAL | Status: AC
  Administered 2017-10-27: 300 mg via ORAL

## 2017-10-27 MED ORDER — BUPIVACAINE-EPINEPHRINE (PF) 0.5% -1:200000 IJ SOLN
INTRAMUSCULAR | Status: DC | PRN
  Administered 2017-10-27: 30 mL via PERINEURAL

## 2017-10-27 MED ORDER — FENTANYL CITRATE (PF) 100 MCG/2ML IJ SOLN
INTRAMUSCULAR | Status: AC
  Filled 2017-10-27: qty 2

## 2017-10-27 MED ORDER — MENTHOL 3 MG MT LOZG: 1.0000 | LOZENGE | OROMUCOSAL | Status: DC | PRN

## 2017-10-27 MED ORDER — ACETAMINOPHEN 10 MG/ML IV SOLN
INTRAVENOUS | Status: AC
  Filled 2017-10-27: qty 100

## 2017-10-27 MED ORDER — NALOXONE HCL 2 MG/2ML IJ SOSY
PREFILLED_SYRINGE | INTRAMUSCULAR | Status: AC
  Filled 2017-10-27: qty 2

## 2017-10-27 MED ORDER — ENOXAPARIN SODIUM 30 MG/0.3ML ~~LOC~~ SOLN
30.0000 mg | SUBCUTANEOUS | Status: DC
  Administered 2017-10-28 – 2017-10-31 (×4): 30 mg via SUBCUTANEOUS
  Filled 2017-10-27 (×4): qty 0.3

## 2017-10-27 MED ORDER — MIDAZOLAM HCL 2 MG/2ML IJ SOLN
INTRAMUSCULAR | Status: AC
  Filled 2017-10-27: qty 2

## 2017-10-27 MED ORDER — SODIUM CHLORIDE 0.9 % IV SOLN
INTRAVENOUS | Status: DC | PRN
  Administered 2017-10-27: 20 ug/min via INTRAVENOUS

## 2017-10-27 MED ORDER — LIDOCAINE HCL (CARDIAC) 20 MG/ML IV SOLN
INTRAVENOUS | Status: DC | PRN
  Administered 2017-10-27: 50 mg via INTRAVENOUS

## 2017-10-27 MED ORDER — BISACODYL 10 MG RE SUPP: 10.0000 mg | Freq: Every day | RECTAL | Status: DC | PRN

## 2017-10-27 MED ORDER — ONDANSETRON HCL 4 MG PO TABS: 4.0000 mg | ORAL_TABLET | Freq: Four times a day (QID) | ORAL | Status: DC | PRN

## 2017-10-27 MED ORDER — VITAMIN E 180 MG (400 UNIT) PO CAPS
400.0000 [IU] | ORAL_CAPSULE | Freq: Every day | ORAL | Status: DC
  Administered 2017-10-28 – 2017-10-31 (×4): 400 [IU] via ORAL
  Filled 2017-10-27 (×5): qty 1

## 2017-10-27 MED ORDER — SUGAMMADEX SODIUM 200 MG/2ML IV SOLN
INTRAVENOUS | Status: AC
  Filled 2017-10-27: qty 2

## 2017-10-27 MED ORDER — MORPHINE SULFATE (PF) 2 MG/ML IV SOLN: 1.0000 mg | INTRAVENOUS | Status: DC | PRN

## 2017-10-27 MED ORDER — PROPOFOL 10 MG/ML IV BOLUS
INTRAVENOUS | Status: DC | PRN
  Administered 2017-10-27: 100 mg via INTRAVENOUS

## 2017-10-27 MED ORDER — BUPIVACAINE LIPOSOME 1.3 % IJ SUSP
INTRAMUSCULAR | Status: AC
  Filled 2017-10-27: qty 20

## 2017-10-27 MED ORDER — ATORVASTATIN CALCIUM 10 MG PO TABS
10.0000 mg | ORAL_TABLET | Freq: Every day | ORAL | Status: DC
  Administered 2017-10-28 – 2017-10-31 (×4): 10 mg via ORAL
  Filled 2017-10-27 (×4): qty 1

## 2017-10-27 MED ORDER — CELECOXIB 200 MG PO CAPS
200.0000 mg | ORAL_CAPSULE | ORAL | Status: AC
  Administered 2017-10-27: 200 mg via ORAL

## 2017-10-27 MED ORDER — NEOMYCIN-POLYMYXIN B GU 40-200000 IR SOLN
Status: DC | PRN
  Administered 2017-10-27: 16 mL

## 2017-10-27 MED ORDER — METOPROLOL TARTRATE 5 MG/5ML IV SOLN
INTRAVENOUS | Status: DC | PRN
  Administered 2017-10-27: 2.5 mg via INTRAVENOUS

## 2017-10-27 MED ORDER — PHENYLEPHRINE HCL 10 MG/ML IJ SOLN
INTRAMUSCULAR | Status: AC
  Filled 2017-10-27: qty 1

## 2017-10-27 MED ORDER — VANCOMYCIN HCL IN DEXTROSE 1-5 GM/200ML-% IV SOLN
1000.0000 mg | Freq: Two times a day (BID) | INTRAVENOUS | Status: AC
  Administered 2017-10-27 – 2017-10-29 (×3): 1000 mg via INTRAVENOUS
  Filled 2017-10-27 (×3): qty 200

## 2017-10-27 MED ORDER — EPHEDRINE SULFATE 50 MG/ML IJ SOLN
INTRAMUSCULAR | Status: AC
  Filled 2017-10-27: qty 1

## 2017-10-27 MED ORDER — WARFARIN SODIUM 2.5 MG PO TABS
2.5000 mg | ORAL_TABLET | ORAL | Status: DC
  Administered 2017-10-28 – 2017-10-30 (×2): 2.5 mg via ORAL
  Filled 2017-10-27 (×2): qty 1

## 2017-10-27 MED ORDER — HYDROMORPHONE HCL 1 MG/ML IJ SOLN
INTRAMUSCULAR | Status: DC | PRN
  Administered 2017-10-27 (×2): 0.5 mg via INTRAVENOUS

## 2017-10-27 MED ORDER — ALUM & MAG HYDROXIDE-SIMETH 200-200-20 MG/5ML PO SUSP: 30.0000 mL | ORAL | Status: DC | PRN

## 2017-10-27 MED ORDER — SUCCINYLCHOLINE CHLORIDE 20 MG/ML IJ SOLN
INTRAMUSCULAR | Status: AC
  Filled 2017-10-27: qty 1

## 2017-10-27 MED ORDER — METHOCARBAMOL 500 MG PO TABS: 500.0000 mg | ORAL_TABLET | Freq: Four times a day (QID) | ORAL | Status: DC | PRN

## 2017-10-27 MED ORDER — PHENOL 1.4 % MT LIQD: 1.0000 | OROMUCOSAL | Status: DC | PRN

## 2017-10-27 MED ORDER — LACTATED RINGERS IV SOLN
INTRAVENOUS | Status: DC
  Administered 2017-10-27: 1000 mL via INTRAVENOUS
  Administered 2017-10-27: 10:00:00 via INTRAVENOUS

## 2017-10-27 MED ORDER — DIPHENHYDRAMINE HCL 12.5 MG/5ML PO ELIX: 12.5000 mg | ORAL_SOLUTION | ORAL | Status: DC | PRN

## 2017-10-27 MED ORDER — ONDANSETRON HCL 4 MG/2ML IJ SOLN: 4.0000 mg | Freq: Once | INTRAMUSCULAR | Status: DC | PRN

## 2017-10-27 MED ORDER — ROCURONIUM BROMIDE 100 MG/10ML IV SOLN
INTRAVENOUS | Status: DC | PRN
  Administered 2017-10-27: 5 mg via INTRAVENOUS
  Administered 2017-10-27: 10 mg via INTRAVENOUS
  Administered 2017-10-27: 25 mg via INTRAVENOUS

## 2017-10-27 MED ORDER — METOCLOPRAMIDE HCL 10 MG PO TABS: 5.0000 mg | ORAL_TABLET | Freq: Three times a day (TID) | ORAL | Status: DC | PRN

## 2017-10-27 MED ORDER — ONDANSETRON HCL 4 MG/2ML IJ SOLN
INTRAMUSCULAR | Status: DC | PRN
  Administered 2017-10-27: 4 mg via INTRAVENOUS

## 2017-10-27 MED ORDER — SODIUM CHLORIDE 0.45 % IV SOLN
INTRAVENOUS | Status: DC
  Administered 2017-10-27 – 2017-10-28 (×2): via INTRAVENOUS

## 2017-10-27 MED ORDER — PHENYLEPHRINE HCL 10 MG/ML IJ SOLN
INTRAMUSCULAR | Status: DC | PRN
  Administered 2017-10-27 (×5): 100 ug via INTRAVENOUS

## 2017-10-27 MED ORDER — AMLODIPINE BESYLATE 5 MG PO TABS
5.0000 mg | ORAL_TABLET | Freq: Every day | ORAL | Status: DC
  Administered 2017-10-28 – 2017-10-30 (×3): 5 mg via ORAL
  Filled 2017-10-27 (×3): qty 1

## 2017-10-27 MED ORDER — ENOXAPARIN SODIUM 30 MG/0.3ML ~~LOC~~ SOLN: 30.0000 mg | Freq: Two times a day (BID) | SUBCUTANEOUS | Status: DC

## 2017-10-27 MED ORDER — METHOCARBAMOL 1000 MG/10ML IJ SOLN: 500.0000 mg | Freq: Four times a day (QID) | INTRAVENOUS | Status: DC | PRN

## 2017-10-27 MED ORDER — DEXAMETHASONE SODIUM PHOSPHATE 10 MG/ML IJ SOLN
INTRAMUSCULAR | Status: DC | PRN
  Administered 2017-10-27: 5 mg via INTRAVENOUS

## 2017-10-27 MED ORDER — SUCCINYLCHOLINE CHLORIDE 20 MG/ML IJ SOLN
INTRAMUSCULAR | Status: DC | PRN
  Administered 2017-10-27: 80 mg via INTRAVENOUS

## 2017-10-27 MED ORDER — FLEET ENEMA 7-19 GM/118ML RE ENEM: 1.0000 | ENEMA | Freq: Once | RECTAL | Status: DC | PRN

## 2017-10-27 MED ORDER — HYDROMORPHONE HCL 1 MG/ML IJ SOLN
INTRAMUSCULAR | Status: AC
Start: 2017-10-27 — End: 2017-10-27
  Filled 2017-10-27: qty 1

## 2017-10-27 MED ORDER — GLYCOPYRROLATE 0.2 MG/ML IJ SOLN
INTRAMUSCULAR | Status: DC | PRN
  Administered 2017-10-27: 0.2 mg via INTRAVENOUS

## 2017-10-27 SURGICAL SUPPLY — 49 items
AUTOTRANSFUS HAS 1/8 (MISCELLANEOUS) ×2
BLADE DEBAKEY 8.0 (BLADE) ×2 IMPLANT
BLADE SAGITTAL WIDE XTHICK NO (BLADE) ×2 IMPLANT
CANISTER SUCT 1200ML W/VALVE (MISCELLANEOUS) ×2 IMPLANT
CANISTER SUCT 3000ML PPV (MISCELLANEOUS) ×2 IMPLANT
CAPT HIP TOTAL 2 ×2 IMPLANT
CHLORAPREP W/TINT 26ML (MISCELLANEOUS) ×2 IMPLANT
CNTNR SPEC 2.5X3XGRAD LEK (MISCELLANEOUS) ×1
CONT SPEC 4OZ STER OR WHT (MISCELLANEOUS) ×1
CONTAINER SPEC 2.5X3XGRAD LEK (MISCELLANEOUS) ×1 IMPLANT
DRAPE INCISE IOBAN 66X60 STRL (DRAPES) ×2 IMPLANT
DRAPE SHEET LG 3/4 BI-LAMINATE (DRAPES) ×2 IMPLANT
DRSG AQUACEL AG ADV 3.5X10 (GAUZE/BANDAGES/DRESSINGS) IMPLANT
DRSG AQUACEL AG ADV 3.5X14 (GAUZE/BANDAGES/DRESSINGS) ×2 IMPLANT
ELECT BLADE 6 FLAT ULTRCLN (ELECTRODE) ×2 IMPLANT
ELECT REM PT RETURN 9FT ADLT (ELECTROSURGICAL) ×2
ELECTRODE REM PT RTRN 9FT ADLT (ELECTROSURGICAL) ×1 IMPLANT
GAUZE PETRO XEROFOAM 1X8 (MISCELLANEOUS) ×2 IMPLANT
GAUZE SPONGE 4X4 12PLY STRL (GAUZE/BANDAGES/DRESSINGS) ×2 IMPLANT
GLOVE INDICATOR 8.0 STRL GRN (GLOVE) ×2 IMPLANT
GLOVE SURG ORTHO 8.5 STRL (GLOVE) ×2 IMPLANT
GOWN STRL REUS W/ TWL LRG LVL3 (GOWN DISPOSABLE) ×1 IMPLANT
GOWN STRL REUS W/TWL LRG LVL3 (GOWN DISPOSABLE) ×1
GOWN STRL REUS W/TWL LRG LVL4 (GOWN DISPOSABLE) ×2 IMPLANT
KIT TURNOVER KIT A (KITS) ×2 IMPLANT
NEEDLE SPNL 18GX3.5 QUINCKE PK (NEEDLE) ×2 IMPLANT
NEEDLE SPNL 20GX3.5 QUINCKE YW (NEEDLE) ×2 IMPLANT
NS IRRIG 1000ML POUR BTL (IV SOLUTION) ×2 IMPLANT
PACK HIP PROSTHESIS (MISCELLANEOUS) ×2 IMPLANT
PAD PREP 24X41 OB/GYN DISP (PERSONAL CARE ITEMS) ×2 IMPLANT
PIN STEIN THRED 5/32 (Pin) ×2 IMPLANT
PULSAVAC PLUS IRRIG FAN TIP (DISPOSABLE) ×2
SOL .9 NS 3000ML IRR  AL (IV SOLUTION) ×1
SOL .9 NS 3000ML IRR UROMATIC (IV SOLUTION) ×1 IMPLANT
SPONGE LAP 18X18 5 PK (GAUZE/BANDAGES/DRESSINGS) IMPLANT
STAPLER SKIN PROX 35W (STAPLE) ×2 IMPLANT
SUT DVC 2 QUILL PDO  T11 36X36 (SUTURE) ×1
SUT DVC 2 QUILL PDO T11 36X36 (SUTURE) ×1 IMPLANT
SUT QUILL PDO 0 36 36 VIOLET (SUTURE) ×2 IMPLANT
SUT TICRON 2-0 30IN 311381 (SUTURE) ×8 IMPLANT
SYR 20CC LL (SYRINGE) ×2 IMPLANT
SYR 30ML LL (SYRINGE) ×4 IMPLANT
SYSTEM AUTOTRANSFUS DUAL TROCR (MISCELLANEOUS) ×1 IMPLANT
TAPE TRANSPORE STRL 2 31045 (GAUZE/BANDAGES/DRESSINGS) ×2 IMPLANT
TIP BRUSH PULSAVAC PLUS 24.33 (MISCELLANEOUS) IMPLANT
TIP FAN IRRIG PULSAVAC PLUS (DISPOSABLE) ×1 IMPLANT
TRAY FOLEY W/METER SILVER 16FR (SET/KITS/TRAYS/PACK) ×2 IMPLANT
TUBE SUCT KAM VAC (TUBING) ×2 IMPLANT
WATER STERILE IRR 1000ML POUR (IV SOLUTION) ×2 IMPLANT

## 2017-10-27 NOTE — Anesthesia Preprocedure Evaluation (Signed)
Anesthesia Evaluation  Patient identified by MRN, date of birth, ID band Patient awake    Reviewed: Allergy & Precautions, H&P , NPO status , Patient's Chart, lab work & pertinent test results, reviewed documented beta blocker date and time   Airway Mallampati: II  TM Distance: >3 FB Neck ROM: full    Dental  (+) Poor Dentition, Missing   Pulmonary neg pulmonary ROS, Current Smoker,    Pulmonary exam normal        Cardiovascular Exercise Tolerance: Poor hypertension, On Medications + Peripheral Vascular Disease  negative cardio ROS Normal cardiovascular exam Rhythm:regular Rate:Normal     Neuro/Psych negative neurological ROS  negative psych ROS   GI/Hepatic negative GI ROS, Neg liver ROS,   Endo/Other  negative endocrine ROS  Renal/GU negative Renal ROS  negative genitourinary   Musculoskeletal   Abdominal   Peds  Hematology negative hematology ROS (+)   Anesthesia Other Findings Past Medical History: No date: Hyperlipidemia No date: Hypertension No date: Hyperuricemia No date: Osteoporosis 02/2010: PAD (peripheral artery disease) (HCC)     Comment:  angiplasty, popliteal artery No date: Tobacco abuse Past Surgical History: July 2011: ANGIOPLASTY     Comment:  PAD- popliteal artery 08/2010: Leg Bypass Surgery; Right 1980s: OVARIAN CYST SURGERY 05/2010: POPLITEAL ARTERY STENT; Right 07/2010: POPLITEAL ARTERY STENT; Right BMI    Body Mass Index:  21.46 kg/m     Reproductive/Obstetrics negative OB ROS                             Anesthesia Physical Anesthesia Plan  ASA: III  Anesthesia Plan: General   Post-op Pain Management:    Induction:   PONV Risk Score and Plan:   Airway Management Planned:   Additional Equipment:   Intra-op Plan:   Post-operative Plan:   Informed Consent: I have reviewed the patients History and Physical, chart, labs and discussed the  procedure including the risks, benefits and alternatives for the proposed anesthesia with the patient or authorized representative who has indicated his/her understanding and acceptance.   Dental Advisory Given  Plan Discussed with: CRNA  Anesthesia Plan Comments:         Anesthesia Quick Evaluation

## 2017-10-27 NOTE — H&P (Signed)
THE PATIENT WAS SEEN PRIOR TO SURGERY TODAY.  HISTORY, ALLERGIES, HOME MEDICATIONS AND OPERATIVE PROCEDURE WERE REVIEWED. RISKS AND BENEFITS OF SURGERY DISCUSSED WITH PATIENT AGAIN.  NO CHANGES FROM INITIAL HISTORY AND PHYSICAL NOTED.    

## 2017-10-27 NOTE — NC FL2 (Signed)
[place] LEVEL OF CARE SCREENING TOOL     IDENTIFICATION  Patient Name: Mary [name] Birthdate: [birth year] Sex: female Admission Date (Current Location): 10/27/2017  [place] and [place] Number:  Engineering geologist and Address:  [facility], [address] [place], [place] [address]      Provider Number: [id number]  Attending Physician Name and Address:  Earnestine Leys, MD  Relative Name and Phone Number:       Current Level of Care: Hospital Recommended Level of Care: [place] Prior Approval Number:    Date Approved/Denied:   PASRR Number: [id number])  Discharge Plan: SNF    Current Diagnoses: Patient Active Problem List   Diagnosis Date Noted  . Hip arthritis 10/27/2017  . Hypokalemia 10/18/2017  . Family history of diabetes mellitus 09/27/2017  . Breast lump on left side at 8 o'clock position 07/08/2017  . Hip pain 12/04/2016  . Swelling of inguinal region 11/13/2016  . Colon cancer screening 11/14/2015  . Neoplasm of uncertain behavior of skin 11/14/2015  . Breast lump on right side at 7 o'clock position 11/14/2015  . Preventative health care 11/14/2015  . Breast cancer screening 10/17/2015  . Medication monitoring encounter 10/17/2015  . Lymphocytosis 10/17/2015  . Encounter for monitoring Coumadin therapy 02/27/2015  . PAD (peripheral artery disease) ([place])   . Hypertension   . Hyperlipidemia   . Tobacco abuse   . Hyperuricemia     Orientation RESPIRATION BLADDER Height & Weight     Self, Situation, Place, Time  O2(1 Liters Oxygen. ) Continent Weight: 125 lb (56.7 kg) Height:  5\' 4"  (162.6 cm)  BEHAVIORAL SYMPTOMS/MOOD NEUROLOGICAL BOWEL NUTRITION STATUS      Continent Diet(Regular Diet. )  AMBULATORY STATUS COMMUNICATION OF NEEDS Skin   Extensive Assist Verbally Surgical wounds(Incision: Left Hip. )                       Personal Care Assistance Level of Assistance  Bathing,  Feeding, Dressing Bathing Assistance: Limited assistance Feeding assistance: Independent Dressing Assistance: Limited assistance     Functional Limitations Info  Sight, Hearing, Speech Sight Info: Adequate Hearing Info: Adequate Speech Info: Adequate    SPECIAL CARE FACTORS FREQUENCY  PT (By licensed PT), OT (By licensed OT)     PT Frequency: (5) OT Frequency: (5)            Contractures      Additional Factors Info  Code Status, Allergies Code Status Info: (Full Code. ) Allergies Info: (No Known Allergies. )           Current Medications (10/27/2017):  This is the current hospital active medication list Current Facility-Administered Medications  Medication Dose Route Frequency Provider Last Rate Last Dose  . 0.45 % sodium chloride infusion   Intravenous Continuous Earnestine Leys, MD 75 mL/hr at 10/27/17 1318    . alum & mag hydroxide-simeth (MAALOX/MYLANTA) 200-200-20 MG/5ML suspension 30 mL  30 mL Oral Q4H PRN Earnestine Leys, MD      . amLODipine (NORVASC) tablet 5 mg  5 mg Oral Daily Earnestine Leys, MD      . atorvastatin (LIPITOR) tablet 10 mg  10 mg Oral Daily Earnestine Leys, MD      . bisacodyl (DULCOLAX) suppository 10 mg  10 mg Rectal Daily PRN Earnestine Leys, MD      . diphenhydrAMINE (BENADRYL) 12.5 MG/5ML elixir 12.5-25 mg  12.5-25 mg Oral Q4H PRN Earnestine Leys, MD      .  docusate sodium (COLACE) capsule 100 mg  100 mg Oral BID Earnestine Leys, MD      . Derrill Memo ON 10/28/2017] enoxaparin (LOVENOX) injection 30 mg  30 mg Subcutaneous Q24H Earnestine Leys, MD      . Derrill Memo ON 10/28/2017] enoxaparin (LOVENOX) injection 30 mg  30 mg Subcutaneous Q12H Earnestine Leys, MD      . ferrous sulfate tablet 325 mg  325 mg Oral Daily Earnestine Leys, MD      . gabapentin (NEURONTIN) capsule 300 mg  300 mg Oral BID Earnestine Leys, MD      . magnesium hydroxide (MILK OF MAGNESIA) suspension 30 mL  30 mL Oral Daily PRN Earnestine Leys, MD      . menthol-cetylpyridinium (CEPACOL)  lozenge 3 mg  1 lozenge Oral PRN Earnestine Leys, MD       Or  . phenol (CHLORASEPTIC) mouth spray 1 spray  1 spray Mouth/Throat PRN Earnestine Leys, MD      . methocarbamol (ROBAXIN) tablet 500 mg  500 mg Oral Q6H PRN Earnestine Leys, MD       Or  . methocarbamol (ROBAXIN) 500 mg in dextrose 5 % 50 mL IVPB  500 mg Intravenous Q6H PRN Earnestine Leys, MD      . metoCLOPramide (REGLAN) tablet 5-10 mg  5-10 mg Oral Q8H PRN Earnestine Leys, MD       Or  . metoCLOPramide (REGLAN) injection 5-10 mg  5-10 mg Intravenous Q8H PRN Earnestine Leys, MD      . naloxone [facility]) 2 MG/2ML injection           . ondansetron (ZOFRAN) tablet 4 mg  4 mg Oral Q6H PRN Earnestine Leys, MD       Or  . ondansetron [facility]) injection 4 mg  4 mg Intravenous Q6H PRN Earnestine Leys, MD      . sodium phosphate (FLEET) 7-19 GM/118ML enema 1 enema  1 enema Rectal Once PRN Earnestine Leys, MD      . vancomycin (VANCOCIN) IVPB 1000 mg/200 mL premix  1,000 mg Intravenous Q12H Earnestine Leys, MD      . vitamin E capsule 400 Units  400 Units Oral Daily Earnestine Leys, MD      . warfarin (COUMADIN) tablet 5 mg  5 mg Oral Laural Golden, MD       And  . Derrill Memo ON 10/28/2017] warfarin (COUMADIN) tablet 2.5 mg  2.5 mg Oral Laural Golden, MD      . Warfarin - Physician Dosing Inpatient   Does not apply [id number] Earnestine Leys, MD         Discharge Medications: Please see discharge summary for a list of discharge medications.  Relevant Imaging Results:  Relevant Lab Results:   Additional Information (SSN: [id number])  Terie Lear, [place] Beets, LCSW

## 2017-10-27 NOTE — Op Note (Signed)
10/27/2017  10:35 AM  PATIENT:  Mary [name]   MRN: [id number]  PRE-OPERATIVE DIAGNOSIS:  M13.852 Other specified arthritis, left hip  POST-OPERATIVE DIAGNOSIS:  arthritis left hip  PROCEDURE:  Procedure(s): TOTAL HIP ARTHROPLASTY LEFT DEPUY AML   PREOPERATIVE INDICATIONS:    ALYZE LAUF is an 67 y.o. female who has a diagnosis of M13.852 Other specified arthritis, left hip and elected for surgical management after failing conservative treatment.  The risks benefits and alternatives were discussed with the patient including but not limited to the risks of nonoperative treatment, versus surgical intervention including infection, bleeding, nerve injury, periprosthetic fracture, the need for revision surgery, dislocation, leg length discrepancy, blood clots, cardiopulmonary complications, morbidity, mortality, among others, and they were willing to proceed.     OPERATIVE REPORT      SURGEON: Park Breed, MD    ASSISTANT:    Montel Clock, [facility]  ANESTHESIA: Spinal    COMPLICATIONS:  None.   DRAINS: 2 Autovacs  EBL:   150 cc                           REPLACED:   None    COMPONENTS:  Depuy AML  femur size 13.5 mm with a 36 mm/ + 5 mm head ball and a Pinnacle 300 acetabular shell size 54 mm with a neutral polyethylene liner    PROCEDURE IN DETAIL:   The patient was met in the holding area and  identified.  The appropriate hip was identified and marked at the operative site.  The patient was then transported to the OR  and  placed under spinall anesthesia.  At that point, the patient was  placed in the lateral decubitus position with the operative side up and  secured to the operating room table and all bony prominences padded.     The operative lower extremity was prepped from the iliac crest to the distal leg.  Sterile draping was performed.  Time out was performed prior to incision.      A routine posterolateral approach was utilized via sharp dissection  carried down to the  subcutaneous tissue.  Gross bleeders were Bovie coagulated.  The iliotibial band was identified and incised along the length of the skin incision.  Self-retaining Charnley retractor was inserted.  With the hip internally rotated, the short external rotators  were identified, released, and tagged.  A Steinman pin was placed in the pelvis above the joint to evaluate leg length and bent appropriately.  The vastus was tagged to identify our starting length.   The hip capsule was released in a T-type fashion and the flaps tagged.    The hip was then dislocated posteriorly and the femoral neck was exposed.  I resected the femoral neck using the appropriate jig. This was performed at approximately a thumb's breadth above the lesser trochanter.    I then exposed the deep acetabulum, cleared out any tissue including the ligamentum teres.  A wing retractor was placed.  After adequate visualization, I excised the labrum, and then sequentially reamed.  I placed the trial acetabulum, which seated nicely, and then impacted the real cup into place.  Appropriate version and inclination was confirmed clinically matching their bony anatomy, and also with the use of the jig.  A trial polyethylene liner was placed and the wing retractor removed.    I then prepared the proximal femur using the cookie-cutter, the lateralizing reamer, and then sequentially reamed  and broached.  A trial broach, neck, and head was utilized, and I reduced the hip and it was found to have excellent stability with functional range of motion. The trial components were then removed, and the real polyethylene liner was placed with the lip directed posteriorly.  I then impacted the real femoral prosthesis into place into the appropriate version, slightly anteverted to the normal anatomy, and I impacted the real head ball into place. The hip was then reduced and taken through functional range of motion and found to have excellent stability. Leg lengths  were restored.  I closed the T in the capsule. And repaired the posterior capsule with #2 Ticron.   2 Autovac drains were placed.   I then irrigated the hip copiously again with pulse lavage, and repaired the fascia with #2 Quill, followed by 0 Quill for the subcutaneous tissue, and staplesl for the skin, Steri-Strips and Aquacel The wounds were injected. Sponge and needle counts were correct.  The patient was then awakened and returned to PACU in stable and satisfactory condition. There were no complications.  Park Breed, MD  10/27/2017 10:35 AM

## 2017-10-27 NOTE — Anesthesia Post-op Follow-up Note (Signed)
Anesthesia QCDR form completed.        

## 2017-10-27 NOTE — Transfer of Care (Signed)
Immediate Anesthesia Transfer of Care Note  Patient: Mary [name]  Procedure(s) Performed: TOTAL HIP ARTHROPLASTY (Left Hip)  Patient Location: PACU  Anesthesia Type:General  Level of Consciousness: sedated  Airway & Oxygen Therapy: Patient Spontanous Breathing and Patient connected to face mask oxygen  Post-op Assessment: Report given to RN and Post -op Vital signs reviewed and stable  Post vital signs: Reviewed  Last Vitals:  Vitals:   10/27/17 0614 10/27/17 1037  BP: (!) 154/92 (!) 91/47  Pulse: 92 63  Resp: 17 14  Temp: (!) 36.1 C 36.6 C  SpO2: 100% 96%    Last Pain:  Vitals:   10/27/17 1037  TempSrc:   PainSc: Asleep         Complications: No apparent anesthesia complications

## 2017-10-27 NOTE — Progress Notes (Signed)
Chaplain prayed for patient and care team.

## 2017-10-27 NOTE — Anesthesia Postprocedure Evaluation (Signed)
Anesthesia Post Note  Patient: Mary [name]  Procedure(s) Performed: TOTAL HIP ARTHROPLASTY (Left Hip)  Patient location during evaluation: PACU Anesthesia Type: General Level of consciousness: awake and alert Pain management: pain level controlled Vital Signs Assessment: post-procedure vital signs reviewed and stable Respiratory status: spontaneous breathing, nonlabored ventilation, respiratory function stable and patient connected to nasal cannula oxygen Cardiovascular status: blood pressure returned to baseline and stable Postop Assessment: no apparent nausea or vomiting Anesthetic complications: no     Last Vitals:  Vitals:   10/27/17 1122 10/27/17 1137  BP: 129/79 123/74  Pulse: 71 73  Resp: 12 12  Temp:    SpO2: 100% 100%    Last Pain:  Vitals:   10/27/17 1137  TempSrc:   PainSc: [name]

## 2017-10-27 NOTE — Anesthesia Procedure Notes (Signed)
Procedure Name: Intubation Date/Time: 10/27/2017 7:45 AM Performed by: Rolla Plate, CRNA Pre-anesthesia Checklist: Patient identified, Patient being monitored, Timeout performed, Emergency Drugs available and Suction available Patient Re-evaluated:Patient Re-evaluated prior to induction Oxygen Delivery Method: Circle system utilized Preoxygenation: Pre-oxygenation with 100% oxygen Induction Type: IV induction and Rapid sequence Ventilation: Mask ventilation without difficulty Laryngoscope Size: 3 and McGraph Grade View: Grade I Tube type: Oral Tube size: 7.0 mm Number of attempts: 1 Airway Equipment and Method: Stylet Placement Confirmation: ETT inserted through vocal cords under direct vision,  positive ETCO2 and breath sounds checked- equal and bilateral Secured at: 21 cm Tube secured with: Tape Dental Injury: Teeth and Oropharynx as per pre-operative assessment  Comments: Extremely poor dentition, chipped, broken , and missing teeth. mcgrath VL used with atraumatic intubation.

## 2017-10-27 NOTE — Progress Notes (Signed)
Patient was asleep. Chaplain prayed for the patient and offered spiritual support.

## 2017-10-28 LAB — BASIC METABOLIC PANEL
Anion gap: 7 (ref 5–15)
BUN: 12 mg/dL (ref 6–20)
CO2: 26 mmol/L (ref 22–32)
Calcium: 8.1 mg/dL — ABNORMAL LOW (ref 8.9–10.3)
Chloride: 108 mmol/L (ref 101–111)
Creatinine, Ser: 0.85 mg/dL (ref 0.44–1.00)
GFR calc Af Amer: >60 mL/min (ref >60)
GFR calc non Af Amer: >60 mL/min (ref >60)
Glucose, Bld: 106 mg/dL — ABNORMAL HIGH (ref 65–99)
Potassium: 3.4 mmol/L — ABNORMAL LOW (ref 3.5–5.1)
Sodium: 141 mmol/L (ref 135–145)

## 2017-10-28 LAB — CBC
HCT: 32.8 % — ABNORMAL LOW (ref 35.0–47.0)
Hemoglobin: 11.3 g/dL — ABNORMAL LOW (ref 12.0–16.0)
MCH: 30.7 pg (ref 26.0–34.0)
MCHC: 34.3 g/dL (ref 32.0–36.0)
MCV: 89.5 fL (ref 80.0–100.0)
Platelets: 225 10*3/uL (ref 150–440)
RBC: 3.66 MIL/uL — ABNORMAL LOW (ref 3.80–5.20)
RDW: 13.6 % (ref 11.5–14.5)
WBC: 10.8 10*3/uL (ref 3.6–11.0)

## 2017-10-28 LAB — PROTIME-INR
INR: 1.24
Prothrombin Time: 15.5 seconds — ABNORMAL HIGH (ref 11.4–15.2)

## 2017-10-28 MED ORDER — POTASSIUM CHLORIDE 20 MEQ PO PACK
40.0000 meq | PACK | Freq: Every day | ORAL | Status: DC
  Administered 2017-10-28 – 2017-10-31 (×4): 40 meq via ORAL
  Filled 2017-10-28 (×4): qty 2

## 2017-10-28 NOTE — Care Management Note (Signed)
Case Management Note  Patient Details  Name: Mary [name] MRN: [id number] Date of Birth: 1951/02/03  Subjective/Objective:   POD # 1 left THA. Met with patient at bedside. She lives at home with her spouse. He will be her caregiver at discharge. She has a walker. PT recommending HHPT.  Offered choice of home health agencies. She chose [facility]. Referral to [facility] with [facility] for HHPT. Will dc on [facility].   Action/Plan: WILL NEED PT ORDERS AND FACE TO FACE PLEASE.                 Expected Discharge Date:  10/30/17               Expected Discharge Plan:  [place]  In-House Referral:     Discharge planning Services  CM Consult  Post Acute Care Choice:  Home Health Choice offered to:  Patient  DME Arranged:    DME Agency:     HH Arranged:  PT [place]:  [facility] [place]  Status of Service:  In process, will continue to follow  If discussed at Long Length of Stay Meetings, dates discussed:    Additional Comments:  Jolly Mango, RN 10/28/2017, 3:18 PM

## 2017-10-28 NOTE — Evaluation (Signed)
Physical Therapy Evaluation Patient Details Name: Mary [name] MRN: [id number] DOB: [birth year] Today's Date: 10/28/2017   History of Present Illness  Pt is a 67 y/o F s/p L THA.  Pt's PMH includes osteoporosis.  Clinical Impression  Pt is s/p L THA resulting in the deficits listed below (see PT Problem List). Ms. Parcher currently requires min guard assist for sit<>stand and cues for proper technique to adhere to posterior precautions. Pt ambulated 200 ft with min guard assist and cues for upright posture and forward gaze.  Pt unable to recall precautions at end of session despite reviewing them twice during session. Pt will benefit from skilled PT to increase their independence and safety with mobility to allow discharge to the venue listed below.     Follow Up Recommendations Home health PT    Equipment Recommendations  None recommended by PT    Recommendations for Other Services       Precautions / Restrictions Precautions Precautions: Fall;Posterior Hip Precaution Booklet Issued: Yes (comment) Precaution Comments: Provided pt with handout and reviewed hip precautions.  Pt unable to recall precautions at end of session despite reviewing them twice during session.  Restrictions Weight Bearing Restrictions: Yes LLE Weight Bearing: Weight bearing as tolerated      Mobility  Bed Mobility Overal bed mobility: Needs Assistance Bed Mobility: Supine to Sit     Supine to sit: Min guard;HOB elevated     General bed mobility comments: Cues for sequencing to adhere to posterior precautions.  Increased time and effort.   Transfers Overall transfer level: Needs assistance Equipment used: Rolling walker (2 wheeled) Transfers: Sit to/from Stand Sit to Stand: Min guard         General transfer comment: Cues for proper hand placement and proper technique for positioning of LLE for sit<>stand.  Pt with well controlled descent to sit.   Ambulation/Gait Ambulation/Gait assistance: Min  guard Ambulation Distance (Feet): 200 Feet Assistive device: Rolling walker (2 wheeled) Gait Pattern/deviations: Step-through pattern;Step-to pattern;Decreased stride length;Decreased stance time - left;Decreased step length - right;Decreased weight shift to left;Antalgic Gait velocity: decreased Gait velocity interpretation: Below normal speed for age/gender General Gait Details: Pt initially demonstrates step to gait pattern which naturally transitions to step through pattern.  Cues for upright posture and forward gaze and proper sequencing of RW.   Stairs            Wheelchair Mobility    Modified Rankin (Stroke Patients Only)       Balance Overall balance assessment: Needs assistance Sitting-balance support: No upper extremity supported;Feet unsupported Sitting balance-Leahy Scale: Good     Standing balance support: No upper extremity supported;During functional activity Standing balance-Leahy Scale: Fair Standing balance comment: Pt able to stand statically without UE support but relies on UE support for dynamic activitiesi                             Pertinent Vitals/Pain Pain Assessment: Faces Faces Pain Scale: Hurts even more Pain Location: L hip Pain Descriptors / Indicators: Grimacing;Guarding Pain Intervention(s): Limited activity within patient's tolerance;Monitored during session;Repositioned;Premedicated before session    Home Living Family/patient expects to be discharged to:: Private residence Living Arrangements: Spouse/significant other Available Help at Discharge: Family;Available 24 hours/day Type of Home: House Home Access: Stairs to enter Entrance Stairs-Rails: Left;Right;Can reach both Entrance Stairs-Number of Steps: 3 Home Layout: One level Home Equipment: Walker - 2 wheels;Cane - single point  Prior Function Level of Independence: Independent with assistive device(s)         Comments: Pt would ambulate with SPC when  she felt she needed to, otherwise did not use AD.  No falls in the past 6 months.      Hand Dominance        Extremity/Trunk Assessment   Upper Extremity Assessment Upper Extremity Assessment: Overall WFL for tasks assessed    Lower Extremity Assessment Lower Extremity Assessment: LLE deficits/detail LLE Deficits / Details: Pt able to perform L hip Abd without assist.  Unable to formally assess but limited strength and ROM as expected s/p L THA.        Communication   Communication: No difficulties  Cognition Arousal/Alertness: Awake/alert Behavior During Therapy: WFL for tasks assessed/performed Overall Cognitive Status: Within Functional Limits for tasks assessed                                        General Comments      Exercises Total Joint Exercises Ankle Circles/Pumps: AROM;Both;10 reps;Supine Quad Sets: Strengthening;Both;10 reps;Supine Gluteal Sets: Strengthening;Both;10 reps;Supine Hip ABduction/ADduction: AAROM;Strengthening;Left;10 reps;Supine   Assessment/Plan    PT Assessment Patient needs continued PT services  PT Problem List Decreased strength;Decreased range of motion;Decreased activity tolerance;Decreased balance;Decreased knowledge of use of DME;Decreased safety awareness;Decreased knowledge of precautions;Pain       PT Treatment Interventions DME instruction;Gait training;Stair training;Functional mobility training;Therapeutic activities;Therapeutic exercise;Balance training;Neuromuscular re-education;Patient/family education;Modalities    PT Goals (Current goals can be found in the Care Plan section)  Acute Rehab PT Goals 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 : A Lot Difficulty sitting down on and standing up from a chair with arms (e.g., wheelchair, bedside commode, etc,.)?: A Lot Help needed moving to and from a bed to chair (including a wheelchair)?: A Little Help needed walking in hospital room?: A Little Help needed climbing 3-5 steps with a railing? : A Lot 6 Click Score: 14    End of Session Equipment Utilized During Treatment: Gait belt Activity Tolerance: Patient tolerated treatment well Patient left: in chair;with call bell/phone within reach;with chair alarm set;Other (comment);with family/visitor present(with pillows between legs) Nurse Communication: Mobility status;Precautions;Weight bearing status PT Visit Diagnosis: Pain;Unsteadiness on feet (R26.81);Other abnormalities of gait and mobility (R26.89);Muscle weakness (generalized) (M62.81);Difficulty in walking, not elsewhere classified (R26.2) Pain - Right/Left: Left Pain - part of body: Hip    Time: 1194-1740 PT Time Calculation (min) (ACUTE ONLY): 34 min   Charges:   PT Evaluation $PT Eval Low Complexity: 1 Low PT Treatments $Gait Training: 8-22 mins $Therapeutic Exercise: 8-22 mins   PT G Codes:        Collie Siad PT, DPT 10/28/2017, 10:28 AM

## 2017-10-28 NOTE — Progress Notes (Signed)
Physical Therapy Treatment Patient Details Name: Mary [name] MRN: [id number] DOB: 31-Jan-1951 Today's Date: 10/28/2017    History of Present Illness Pt is a 67 y/o F s/p L THA.  Pt's PMH includes osteoporosis.    PT Comments    Mary [name] put forth good effort with therapy this afternoon.  She ambulated 130 ft with cues provided for proper posture when ambulating.  Pt requested to defer stair training until tomorrow due to fatigue.  She requires additional education on posterior hip precautions as she was unable to recall any of her precautions this afternoon.  Pt will benefit from continued skilled PT services to increase functional independence and safety.    Follow Up Recommendations  Home health PT     Equipment Recommendations  None recommended by PT    Recommendations for Other Services       Precautions / Restrictions Precautions Precautions: Fall;Posterior Hip Precaution Comments: Pt unable to recall precautions.  Reviewed hip precautions with pt.  Restrictions Weight Bearing Restrictions: Yes LLE Weight Bearing: Weight bearing as tolerated    Mobility  Bed Mobility Overal bed mobility: Needs Assistance Bed Mobility: Supine to Sit;Sit to Supine     Supine to sit: Min guard;HOB elevated Sit to supine: Min guard   General bed mobility comments: Cues for sequencing to adhere to posterior precautions.  Increased time and effort.   Transfers Overall transfer level: Needs assistance Equipment used: Rolling walker (2 wheeled) Transfers: Sit to/from Stand Sit to Stand: Min guard         General transfer comment: Pt performs with proper technique to adhere to posterior precautions for sit>stand but requires cues for proper technique for stand>sit.   Ambulation/Gait Ambulation/Gait assistance: Min guard Ambulation Distance (Feet): 130 Feet Assistive device: Rolling walker (2 wheeled) Gait Pattern/deviations: Step-through pattern;Step-to pattern;Decreased stride  length;Decreased stance time - left;Decreased step length - right;Decreased weight shift to left;Antalgic Gait velocity: decreased Gait velocity interpretation: Below normal speed for age/gender General Gait Details: Pt initially demonstrates step to gait pattern which naturally transitions to step through pattern.  Cues for upright posture and forward gaze and proper sequencing of RW.    Stairs            Wheelchair Mobility    Modified Rankin (Stroke Patients Only)       Balance Overall balance assessment: Needs assistance Sitting-balance support: No upper extremity supported;Feet unsupported Sitting balance-Leahy Scale: Good     Standing balance support: No upper extremity supported;During functional activity Standing balance-Leahy Scale: Fair Standing balance comment: Pt able to stand statically without UE support but relies on UE support for dynamic activitiesi                            Cognition Arousal/Alertness: Awake/alert Behavior During Therapy: WFL for tasks assessed/performed Overall Cognitive Status: Within Functional Limits for tasks assessed                                        Exercises Total Joint Exercises Ankle Circles/Pumps: AROM;Both;10 reps;Supine Quad Sets: Strengthening;Both;10 reps;Supine Long Arc Quad: AROM;Left;10 reps;Seated Knee Flexion: AROM;Left;10 reps;Standing    General Comments        Pertinent Vitals/Pain Pain Assessment: 0-10 Pain Score: 4  Pain Location: L hip Pain Descriptors / Indicators: Grimacing;Guarding Pain Intervention(s): Limited activity within patient's tolerance;Monitored during session;Repositioned  Home Living                      Prior Function            PT Goals (current goals can now be found in the care plan section) Acute Rehab PT Goals 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 : A Lot Difficulty sitting down on and standing up from a chair with arms (e.g., wheelchair, bedside commode, etc,.)?: A Little Help needed moving to and from a bed to chair (including a wheelchair)?: A Little Help needed walking in hospital room?: A Little Help needed climbing 3-5 steps with a railing? : A Little 6 Click Score: 16    End of Session Equipment Utilized During Treatment: Gait belt Activity Tolerance: Patient tolerated treatment well;Patient limited by fatigue Patient left: with call bell/phone within reach;in bed;with bed alarm set;with SCD's reapplied Nurse Communication: Mobility status PT Visit Diagnosis: Pain;Unsteadiness on feet (R26.81);Other abnormalities of gait and mobility (R26.89);Muscle weakness (generalized) (M62.81);Difficulty in walking, not elsewhere classified (R26.2) Pain - Right/Left: Left Pain - part of body: Hip     Time: 1451-1511 PT Time Calculation (min) (ACUTE ONLY): 20 min  Charges:  $Gait Training: 8-22 mins                    G Codes:       Collie Siad PT, DPT 10/28/2017, 3:35 PM

## 2017-10-28 NOTE — Progress Notes (Signed)
Clinical Social Worker (CSW) received SNF consult. PT is recommending home health. RN case manager aware of above. Please reconsult if future social work needs arise. CSW signing off.   Maribelle Hopple, LCSW [phone] 

## 2017-10-28 NOTE — Progress Notes (Signed)
Subjective:    Slept all night.  Walked with pT this AM  Pain mild.  K+  3.4.  Will give supplement  1 Day Post-Op Procedure(s) (LRB): TOTAL HIP ARTHROPLASTY (Left)    Patient reports pain as mild.  Objective:   VITALS:   Vitals:   10/28/17 0453 10/28/17 0731  BP: 122/67 123/64  Pulse: 84 84  Resp: 19 18  Temp: 98.9 F (37.2 C) 98.7 F (37.1 C)  SpO2:  96%    Neurologically intact ABD soft Neurovascular intact Sensation intact distally Intact pulses distally Dorsiflexion/Plantar flexion intact Incision: scant drainage  LABS Recent Labs    10/27/17 0641 10/27/17 1534 10/28/17 0359  HGB 14.6 13.5 11.3*  HCT 43.0 41.4 32.8*  WBC  --  12.5* 10.8  PLT  --  269 225    Recent Labs    10/27/17 0641 10/27/17 1534 10/28/17 0359  NA 144  --  141  K 3.9  --  3.4*  BUN  --   --  12  CREATININE  --  0.77 0.85  GLUCOSE 98  --  106*    Recent Labs    10/27/17 0629 10/28/17 0456  INR 1.19 1.24     Assessment/Plan: 1 Day Post-Op Procedure(s) (LRB): TOTAL HIP ARTHROPLASTY (Left)   Advance diet Up with therapy D/C IV fluids Discharge to SNF

## 2017-10-29 LAB — PROTIME-INR
INR: 1.67
Prothrombin Time: 19.6 seconds — ABNORMAL HIGH (ref 11.4–15.2)

## 2017-10-29 LAB — CBC
HCT: 33.5 % — ABNORMAL LOW (ref 35.0–47.0)
Hemoglobin: 11.1 g/dL — ABNORMAL LOW (ref 12.0–16.0)
MCH: 29.7 pg (ref 26.0–34.0)
MCHC: 33.1 g/dL (ref 32.0–36.0)
MCV: 89.6 fL (ref 80.0–100.0)
Platelets: 205 10*3/uL (ref 150–440)
RBC: 3.74 MIL/uL — ABNORMAL LOW (ref 3.80–5.20)
RDW: 13.8 % (ref 11.5–14.5)
WBC: 12.5 10*3/uL — ABNORMAL HIGH (ref 3.6–11.0)

## 2017-10-29 LAB — SURGICAL PATHOLOGY

## 2017-10-29 NOTE — Progress Notes (Signed)
Physical Therapy Treatment Patient Details Name: Mary [name] MRN: [id number] DOB: 1950-12-19 Today's Date: 10/29/2017    History of Present Illness Pt is a 67 y/o F s/p L THA.  Pt's PMH includes osteoporosis.    PT Comments    Mary [name] made good progress this session.  She completed stair training with cues for sequencing.  Pt unable to recall any of her precautions at start of session.  Reviewed all precautions again and encouraged pt to reference handout every hour on the hour until she memorizes her precautions.  Pt able to recall 2/3 precautions at the end of the session.  Follow up recommendations remain appropriate.     Follow Up Recommendations  Home health PT     Equipment Recommendations  None recommended by PT    Recommendations for Other Services       Precautions / Restrictions Precautions Precautions: Fall;Posterior Hip Precaution Comments: Pt unable to recall any of her precautions at start of session.  Reviewed all precautions.  Able to recall 2/3 precautions at end of session.  Restrictions Weight Bearing Restrictions: Yes LLE Weight Bearing: Weight bearing as tolerated    Mobility  Bed Mobility Overal bed mobility: Needs Assistance Bed Mobility: Supine to Sit     Supine to sit: Min guard;HOB elevated     General bed mobility comments: Max increased time and min verbal cues for proper technique to adhere to precautions.    Transfers Overall transfer level: Needs assistance Equipment used: Rolling walker (2 wheeled) Transfers: Sit to/from Stand Sit to Stand: Min guard         General transfer comment: Pt performs proper technique to adhere to precautions for sit<>stand.  Pt with poorly controlled descent toward end of stand>sit.   Ambulation/Gait Ambulation/Gait assistance: Min guard Ambulation Distance (Feet): 200 Feet Assistive device: Rolling walker (2 wheeled) Gait Pattern/deviations: Step-through pattern;Decreased stride length;Decreased  stance time - left;Decreased step length - right;Decreased weight shift to left;Antalgic Gait velocity: decreased Gait velocity interpretation: Below normal speed for age/gender General Gait Details: Pt demonstrates step through gait pattern.  Seated rest break after ambulating 100 ft.  Decreased gait speed this session due to stiffness L hip.     Stairs Stairs: Yes   Stair Management: Two rails;Step to pattern;Forwards Number of Stairs: 4 General stair comments: Cues for proper sequencing.  Min guard for safety.    Wheelchair Mobility    Modified Rankin (Stroke Patients Only)       Balance Overall balance assessment: Needs assistance Sitting-balance support: No upper extremity supported;Feet unsupported Sitting balance-Leahy Scale: Good     Standing balance support: No upper extremity supported;During functional activity Standing balance-Leahy Scale: Fair Standing balance comment: Pt able to stand statically without UE support but relies on UE support for dynamic activities                            Cognition Arousal/Alertness: Awake/alert Behavior During Therapy: WFL for tasks assessed/performed Overall Cognitive Status: Within Functional Limits for tasks assessed                                        Exercises Total Joint Exercises Ankle Circles/Pumps: AROM;Both;10 reps;Supine Hip ABduction/ADduction: AAROM;Strengthening;Left;10 reps;Supine Long Arc Quad: AROM;Left;10 reps;Seated Knee Flexion: AROM;Left;10 reps;Standing    General Comments        Pertinent Vitals/Pain  Pain Assessment: 0-10 Pain Score: 3  Pain Location: L hip Pain Descriptors / Indicators: Grimacing;Guarding Pain Intervention(s): Limited activity within patient's tolerance;Monitored during session;Premedicated before session;Repositioned    Home Living                      Prior Function            PT Goals (current goals can now be found in the  care plan section) Acute Rehab PT Goals 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 : A Lot Difficulty sitting down on and standing up from a chair with arms (e.g., wheelchair, bedside commode, etc,.)?: A Little Help needed moving to and from a bed to chair (including a wheelchair)?: A Little Help needed walking in hospital room?: A Little Help needed climbing 3-5 steps with a railing? : A Little 6 Click Score: 16    End of Session Equipment Utilized During Treatment: Gait belt Activity Tolerance: Patient tolerated treatment well Patient left: with call bell/phone within reach;in chair;with chair alarm set;Other (comment)(pillows between legs) Nurse Communication: Mobility status PT Visit Diagnosis: Pain;Unsteadiness on feet (R26.81);Other abnormalities of gait and mobility (R26.89);Muscle weakness (generalized) (M62.81);Difficulty in walking, not elsewhere classified (R26.2) Pain - Right/Left: Left Pain - part of body: Hip     Time: 2229-7989 PT Time Calculation (min) (ACUTE ONLY): 35 min  Charges:  $Gait Training: 8-22 mins $Therapeutic Exercise: 8-22 mins                    G Codes:       Collie Siad PT, DPT 10/29/2017, 10:48 AM

## 2017-10-29 NOTE — Progress Notes (Signed)
Subjective: More pain today.  Still some blood in sputum from trachael tube.  More lethargisc  2 Days Post-Op Procedure(s) (LRB): TOTAL HIP ARTHROPLASTY (Left)    Patient reports pain as moderate  Objective:   VITALS:   Vitals:   10/29/17 0426 10/29/17 0932  BP: 126/68 128/74  Pulse: (!) 104   Resp: 19   Temp: 99.5 F (37.5 C)   SpO2: 95%     Neurologically intact ABD soft Neurovascular intact Sensation intact distally Intact pulses distally Dorsiflexion/Plantar flexion intact Incision: scant drainage  LABS Recent Labs    10/27/17 1534 10/28/17 0359 10/29/17 0532  HGB 13.5 11.3* 11.1*  HCT 41.4 32.8* 33.5*  WBC 12.5* 10.8 12.5*  PLT 269 225 205    Recent Labs    10/27/17 0641 10/27/17 1534 10/28/17 0359  NA 144  --  141  K 3.9  --  3.4*  BUN  --   --  12  CREATININE  --  0.77 0.85  GLUCOSE 98  --  106*    Recent Labs    10/28/17 0456 10/29/17 0532  INR 1.24 1.67     Assessment/Plan: 2 Days Post-Op Procedure(s) (LRB): TOTAL HIP ARTHROPLASTY (Left)   Up with therapy Discharge home with home health tomorrow

## 2017-10-29 NOTE — Progress Notes (Signed)
Physical Therapy Treatment Patient Details Name: Mary [name] MRN: [id number] DOB: [birth year] Today's Date: 10/29/2017    History of Present Illness Pt is a 67 y/o F s/p L THA.  Pt's PMH includes osteoporosis.    PT Comments    Ms. Demattia's session was cut short as pt reports feeling well initially while ambulating but after ambulating 100 ft the pt reports feeling dizzy.  Pt provided with chair to sit.  BP taken reading 84/53, temperature 100, pulse 122 at rest in sitting.  RN notified immediately.  Pt reclined in chair and after several minutes of sitting reclined in chair her vitals were retaken: BP 118/69, pulse 113, SpO2 89-91%.  Pt reports feeling better at end of session.  Pt was able to recall 2/3 hip precautions this session without cues.  Follow up recommendations remain appropriate.  Stair training completed during previous session.    Follow Up Recommendations  Home health PT     Equipment Recommendations  None recommended by PT    Recommendations for Other Services       Precautions / Restrictions Precautions Precautions: Fall;Posterior Hip Precaution Comments: Pt able to recall 2/3 hip precautions Restrictions Weight Bearing Restrictions: Yes LLE Weight Bearing: Weight bearing as tolerated    Mobility  Bed Mobility Overal bed mobility: Needs Assistance Bed Mobility: Supine to Sit     Supine to sit: Min guard;HOB elevated     General bed mobility comments: Max increased time but no verbal cues required to adhere to precautions this session.   Transfers Overall transfer level: Needs assistance Equipment used: Rolling walker (2 wheeled) Transfers: Sit to/from Stand Sit to Stand: Min guard         General transfer comment: Cues for technique to avoid L hip IR as pt demonstrated this with sit>stand from bed.  Pt stood from bed x1, from chair x1.   Ambulation/Gait Ambulation/Gait assistance: Min guard Ambulation Distance (Feet): 100 Feet Assistive device:  Rolling walker (2 wheeled) Gait Pattern/deviations: Step-through pattern;Decreased stride length;Decreased stance time - left;Decreased step length - right;Decreased weight shift to left;Antalgic Gait velocity: decreased Gait velocity interpretation: Below normal speed for age/gender General Gait Details: Pt demonstrates step through gait pattern.  Pt reports feeling well initially while ambulating but after ambulating 100 ft the pt reports feeling dizzy.  Pt provided with chair to sit.  BP taken reading 84/53, temperature 100, pulse 122 at rest in sitting.  RN notified immediately.  Pt reclined in chair and after several minutes of sitting reclined in chair BP 118/69, pulse 113, SpO2 89-91%.     Stairs Stairs: Yes   Stair Management: Two rails;Step to pattern;Forwards Number of Stairs: 4 General stair comments: Cues for proper sequencing.  Min guard for safety.    Wheelchair Mobility    Modified Rankin (Stroke Patients Only)       Balance Overall balance assessment: Needs assistance Sitting-balance support: No upper extremity supported;Feet unsupported Sitting balance-Leahy Scale: Good     Standing balance support: No upper extremity supported;During functional activity Standing balance-Leahy Scale: Fair Standing balance comment: Pt able to stand statically without UE support but relies on UE support for dynamic activities                            Cognition Arousal/Alertness: Awake/alert Behavior During Therapy: WFL for tasks assessed/performed Overall Cognitive Status: Within Functional Limits for tasks assessed  Exercises Total Joint Exercises Ankle Circles/Pumps: AROM;Both;10 reps;Supine Quad Sets: Strengthening;Both;10 reps;Supine Gluteal Sets: Both;10 reps;Supine;Strengthening Hip ABduction/ADduction: AAROM;Strengthening;Left;10 reps;Supine Long Arc Quad: AROM;Left;10 reps;Seated Knee Flexion:  AROM;Left;10 reps;Standing    General Comments        Pertinent Vitals/Pain Pain Assessment: Faces Pain Score: 3  Faces Pain Scale: Hurts even more Pain Location: L hip Pain Descriptors / Indicators: Grimacing;Guarding Pain Intervention(s): Limited activity within patient's tolerance;Monitored during session;Repositioned    Home Living                      Prior Function            PT Goals (current goals can now be found in the care plan section) Acute Rehab PT Goals Patient Stated Goal: to return to flower gardening PT Goal Formulation: With patient Time For Goal Achievement: 11/11/17 Potential to Achieve Goals: Good Progress towards PT goals: Not progressing toward goals - comment(due to hypotension this session)    Frequency    BID      PT Plan Current plan remains appropriate    Co-evaluation              AM-PAC PT "6 Clicks" Daily Activity  Outcome Measure  Difficulty turning over in bed (including adjusting bedclothes, sheets and blankets)?: A Lot Difficulty moving from lying on back to sitting on the side of the bed? : A Lot Difficulty sitting down on and standing up from a chair with arms (e.g., wheelchair, bedside commode, etc,.)?: A Little Help needed moving to and from a bed to chair (including a wheelchair)?: A Little Help needed walking in hospital room?: A Little Help needed climbing 3-5 steps with a railing? : A Little 6 Click Score: 16    End of Session Equipment Utilized During Treatment: Gait belt Activity Tolerance: Treatment limited secondary to medical complications (Comment)(hypotension and elevated HR) Patient left: with call bell/phone within reach;in chair;with chair alarm set;Other (comment)(pillows between legs; reclined in chair) Nurse Communication: Mobility status;Other (comment)(Vitals) PT Visit Diagnosis: Pain;Unsteadiness on feet (R26.81);Other abnormalities of gait and mobility (R26.89);Muscle weakness  (generalized) (M62.81);Difficulty in walking, not elsewhere classified (R26.2) Pain - Right/Left: Left Pain - part of body: Hip     Time: 9485-4627 PT Time Calculation (min) (ACUTE ONLY): 34 min  Charges:  $Gait Training: 8-22 mins $Therapeutic Exercise: 8-22 mins                    G Codes:       Collie Siad PT, DPT 10/29/2017, 2:19 PM

## 2017-10-30 ENCOUNTER — Encounter: Payer: Self-pay | Admitting: Internal Medicine

## 2017-10-30 ENCOUNTER — Inpatient Hospital Stay: Payer: Medicare HMO

## 2017-10-30 LAB — CBC
HCT: 35.8 % (ref 35.0–47.0)
Hemoglobin: 12 g/dL (ref 12.0–16.0)
MCH: 30.2 pg (ref 26.0–34.0)
MCHC: 33.7 g/dL (ref 32.0–36.0)
MCV: 89.5 fL (ref 80.0–100.0)
Platelets: 205 10*3/uL (ref 150–440)
RBC: 3.99 MIL/uL (ref 3.80–5.20)
RDW: 13.8 % (ref 11.5–14.5)
WBC: 11.3 10*3/uL — ABNORMAL HIGH (ref 3.6–11.0)

## 2017-10-30 LAB — URINALYSIS, COMPLETE (UACMP) WITH MICROSCOPIC
Bacteria, UA: NONE SEEN
Bilirubin Urine: NEGATIVE
Glucose, UA: NEGATIVE mg/dL
Hgb urine dipstick: NEGATIVE
Ketones, ur: NEGATIVE mg/dL
Leukocytes, UA: NEGATIVE
Nitrite: NEGATIVE
Protein, ur: NEGATIVE mg/dL
Specific Gravity, Urine: 1.011 (ref 1.005–1.030)
pH: 7 (ref 5.0–8.0)

## 2017-10-30 LAB — PROTIME-INR
INR: 1.8
Prothrombin Time: 20.7 seconds — ABNORMAL HIGH (ref 11.4–15.2)

## 2017-10-30 MED ORDER — SODIUM CHLORIDE 0.9 % IV SOLN
INTRAVENOUS | Status: DC
  Administered 2017-10-30 – 2017-10-31 (×2): via INTRAVENOUS

## 2017-10-30 NOTE — Progress Notes (Signed)
Physical Therapy Treatment Patient Details Name: Mary [name] MRN: [id number] DOB: 12/29/1950 Today's Date: 10/30/2017    History of Present Illness Pt is a 67 y/o F s/p L THA.  Pt's PMH includes osteoporosis.    PT Comments    Session begins with orthostatic vitals, noted drop from supine to sitting and again to standing. Pt sits at EOB x several minutes, and performing STS transfers practice 10x with 5-10sec stance, and 5-10sec sitting rest between bouts. Pt asymptomatic, no dizziness, but eventually yawning and tired after last BP taken. HR checked manually after 130BPM read standing, found to be irregular with rate 110BPM Lt radial pulse. Pt making progress, but still limited but orthostasis.    10/30/17 1652  Therapy Vitals  Pulse Rate (!) 110 (irregular, 130 per BP cuff, 110 Lt radial pulse. )  Patient Position (if appropriate) Orthostatic Vitals  Orthostatic Lying   BP- Lying 133/68  Pulse- Lying 106  Orthostatic Sitting  BP- Sitting 110/74  Pulse- Sitting 100  Orthostatic Standing at 0 minutes  BP- Standing at 0 minutes 103/80  Pulse- Standing at 0 minutes 130 (when manually taken, 110 Lt radial pulse, irregular)  Orthostatic Standing at 3 minutes  BP- Standing at 3 minutes 90/57 (p 10xSTS: )  Pulse- Standing at 3 minutes 100 (irregular Lt radial)       Follow Up Recommendations  Home health PT     Equipment Recommendations  None recommended by PT    Recommendations for Other Services       Precautions / Restrictions Precautions Precautions: Fall;Posterior Hip Precaution Booklet Issued: Yes (comment) Precaution Comments: Pt able to recall 3/3 hip precautions Restrictions Weight Bearing Restrictions: Yes LLE Weight Bearing: Weight bearing as tolerated    Mobility  Bed Mobility                  Transfers Overall transfer level: Needs assistance Equipment used: Rolling walker (2 wheeled) Transfers: Sit to/from Stand Sit to Stand:  Supervision;From elevated surface         General transfer comment: Performed 10x to improve technique and strength; eventually BP gets lower 90/69mmHg. Adherance to precautions is satisfactory   Ambulation/Gait Ambulation/Gait assistance: (not pursued this session in light of orthostatic BP)               Stairs            Wheelchair Mobility    Modified Rankin (Stroke Patients Only)       Balance Overall balance assessment: Modified Independent         Standing balance support: During functional activity;Single extremity supported Standing balance-Leahy Scale: Fair                              Cognition Arousal/Alertness: Awake/alert Behavior During Therapy: WFL for tasks assessed/performed Overall Cognitive Status: Within Functional Limits for tasks assessed                                        Exercises      General Comments        Pertinent Vitals/Pain      Home Living                      Prior Function            PT  Goals (current goals can now be found in the care plan section) Acute Rehab PT Goals 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 : A Lot Difficulty sitting down on and standing up from a chair with arms (e.g., wheelchair, bedside commode, etc,.)?: A Little Help needed moving to and from a bed to chair (including a wheelchair)?: A Little Help needed walking in hospital room?: A Little Help needed  climbing 3-5 steps with a railing? : A Little 6 Click Score: 16    End of Session Equipment Utilized During Treatment: Gait belt Activity Tolerance: Treatment limited secondary to medical complications (Comment) Patient left: with call bell/phone within reach;in chair;with chair alarm set;Other (comment) Nurse Communication: Mobility status;Other (comment) PT Visit Diagnosis: Pain;Unsteadiness on feet (R26.81);Other abnormalities of gait and mobility (R26.89);Muscle weakness (generalized) (M62.81);Difficulty in walking, not elsewhere classified (R26.2) Pain - Right/Left: Left Pain - part of body: Hip     Time: 3664-4034 PT Time Calculation (min) (ACUTE ONLY): 31 min  Charges:  $Therapeutic Activity: 23-37 mins                    G Codes:       5:17 PM, 18-Nov-2017 Etta Grandchild, PT, DPT Physical Therapist - [facility]  [phone] ([place])    Mary [name] C 11/18/17, 5:14 PM

## 2017-10-30 NOTE — Consult Note (Signed)
[facility] [place] NAME: Mary [name]    MR#:  [id number]  DATE OF BIRTH:  June 21, 1951  DATE OF ADMISSION:  10/27/2017  PRIMARY CARE PHYSICIAN: Arnetha Courser, MD   REQUESTING/REFERRING PHYSICIAN: Dr. Earnestine Leys  CHIEF COMPLAINT:  Consult for orthostatic hypotension.  HISTORY OF PRESENT ILLNESS:  Mary [name]  is a 67 y.o. female with a known history of hypertension underwent an elective left hip replacement by Dr. Sabra Heck on 10/27/2017.  We were consulted for orthostatic hypotension today.  The patient felt a little dizzy with walking around.  She was found to be orthostatic.  No complaints of chest pain or shortness of breath.  Does have some soreness in the left hip.  In reviewing the last hemoglobin, as he has been relatively stable.  Of note the patient is on Norvasc.  PAST MEDICAL HISTORY:   Past Medical History:  Diagnosis Date  . Arthritis   . Hyperlipidemia   . Hypertension   . Hyperuricemia   . Osteoporosis   . PAD (peripheral artery disease) ([place]) 02/2010   angiplasty, popliteal artery  . Tobacco abuse     PAST SURGICAL HISTOIRY:   Past Surgical History:  Procedure Laterality Date  . ANGIOPLASTY  July 2011   PAD- popliteal artery  . Leg Bypass Surgery Right 08/2010  . OVARIAN CYST SURGERY  1980s  . POPLITEAL ARTERY STENT Right 05/2010  . POPLITEAL ARTERY STENT Right 07/2010  . TOTAL HIP ARTHROPLASTY Left 10/27/2017   Procedure: TOTAL HIP ARTHROPLASTY;  Surgeon: Earnestine Leys, MD;  Location: [facility] ORS;  Service: Orthopedics;  Laterality: Left;    SOCIAL HISTORY:   Social History   Tobacco Use  . Smoking status: Current Every Day Smoker    Packs/day: 0.50    Types: Cigarettes  . Smokeless tobacco: Never Used  Substance Use Topics  . Alcohol use: No    FAMILY HISTORY:   Family History  Problem Relation Age of Onset  . Arthritis Mother   . Diabetes Mother   . Stroke Mother   . Hypertension Mother   . Cancer  Father   . Arthritis Sister   . Seizures Sister   . Diabetes Sister   . Hypertension Sister   . COPD Sister   . Cancer Brother   . Stroke Maternal Grandmother   . Diabetes Paternal Grandmother   . Cancer Sister   . Diabetes Sister   . Diabetes Brother   . Diabetes Brother   . Heart disease Neg Hx   . Breast cancer Neg Hx     DRUG ALLERGIES:  No Known Allergies  REVIEW OF SYSTEMS:  CONSTITUTIONAL: No fever.  Some chills and sweats.  Some weight loss.  No fatigue or weakness.  EYES: No blurred or double vision.  EARS, NOSE, AND THROAT: No tinnitus or ear pain.  RESPIRATORY: No cough, shortness of breath, wheezing or hemoptysis.  CARDIOVASCULAR: No chest pain, orthopnea, edema.  GASTROINTESTINAL: No nausea, vomiting, diarrhea or abdominal pain.  GENITOURINARY: No dysuria, hematuria.  ENDOCRINE: No polyuria, nocturia,  HEMATOLOGY: No anemia, easy bruising or bleeding SKIN: No rash or lesion. MUSCULOSKELETAL: Left hip soreness NEUROLOGIC: No tingling, numbness, weakness.  Dizziness when standing PSYCHIATRY: No anxiety or depression.   MEDICATIONS AT HOME:   Prior to Admission medications   Medication Sig Start Date End Date Taking? Authorizing Provider  acetaminophen (TYLENOL) 500 MG tablet Take 500 mg by mouth 2 (two) times daily as needed for  moderate pain or headache.   Yes [provider]  amLODipine (NORVASC) 5 MG tablet Take 1 tablet (5 mg total) daily by mouth. 07/08/17  Yes Lada, Satira Anis, MD  atorvastatin (LIPITOR) 10 MG tablet TAKE 1 TABLET BY MOUTH ONCE DAILY 10/11/17  Yes Dew, Erskine Squibb, MD  ferrous sulfate 325 (65 FE) MG tablet Take 325 mg by mouth daily.   Yes [provider]  gabapentin (NEURONTIN) 300 MG capsule TAKE ONE CAPSULE BY MOUTH TWICE DAILY Patient taking differently: Take 300 mgs by mouth once daily 03/05/17  Yes Stegmayer, Joelene Millin A, PA-C  vitamin E 400 UNIT capsule Take 400 Units by mouth daily.   Yes [provider]   warfarin (COUMADIN) 5 MG tablet TAKE AS DIRECTED Patient taking differently: Take 5 mg once daily in the afternoon on day 1 then take 2.5 mg once daily in the afternoon on day 2, alternating days 10/12/16  Yes Schnier, Dolores Lory, MD      VITAL SIGNS:  Blood pressure 110/69, pulse 100, temperature 99.2 F (37.3 C), temperature source Oral, resp. rate 18, height 5\' 4"  (1.626 m), weight 56.7 kg (125 lb), SpO2 92 %.  PHYSICAL EXAMINATION:  GENERAL:  67 y.o.-year-old patient lying in the bed with no acute distress.  EYES: Pupils equal, round, reactive to light and accommodation. No scleral icterus. Extraocular muscles intact.  HEENT: Head atraumatic, normocephalic. Oropharynx and nasopharynx clear.  NECK:  Supple, no jugular venous distention. No thyroid enlargement, no tenderness.  LUNGS: Normal breath sounds bilaterally, no wheezing, rales,rhonchi or crepitation. No use of accessory muscles of respiration.  CARDIOVASCULAR: S1, S2 normal. No murmurs, rubs, or gallops.  ABDOMEN: Soft, nontender, nondistended. Bowel sounds present. No organomegaly or mass.  EXTREMITIES: No pedal edema, cyanosis, or clubbing.  NEUROLOGIC: Cranial nerves II through XII are intact. Muscle strength 5/5 in all extremities. Sensation intact. Gait not checked.  PSYCHIATRIC: The patient is alert and oriented x 3.  SKIN: No obvious rash, lesion, or ulcer.   LABORATORY PANEL:   CBC Recent Labs  Lab 10/30/17 0407  WBC 11.3*  HGB 12.0  HCT 35.8  PLT 205   ------------------------------------------------------------------------------------------------------------------  Chemistries  Recent Labs  Lab 10/28/17 0359  NA 141  K 3.4*  CL 108  CO2 26  GLUCOSE 106*  BUN 12  CREATININE 0.85  CALCIUM 8.1*   ------------------------------------------------------------------------------------------------------------------    EKG:   Previous EKG on 09/27/2017 reviewed by me, normal sinus rhythm with no acute  changes.  IMPRESSION AND PLAN:   1.  Orthostatic hypotension.  Hold Norvasc.  Gentle IV fluid hydration.  Continue to monitor orthostatic vital signs twice a day while here.  Obtain EKG. 2.  Low-grade fever.  Unclear if this is postoperative or not.  Obtain chest x-ray and urine analysis. 3.  PAD on Coumadin.  INR is still subtherapeutic.  INR being checked on a daily basis. 4.  Hyperlipidemia unspecified on atorvastatin 5.  Neuropathy on gabapentin  I have personally reviewed, Prior EKG.  I have reviewed laboratory data.  Management plans discussed with the patient, family and she is in agreement.  CODE STATUS: Full code  TOTAL TIME TAKING CARE OF THIS PATIENT: 50 minutes.    Loletha Grayer M.D on 10/30/2017 at 2:25 PM  Between 7am to 6pm - Pager - [phone]  After 6pm go to [email or web address] - password [email or web address]  [facility]  Office  [phone]  CC: Primary care Physician: Arnetha Courser, MD

## 2017-10-30 NOTE — Progress Notes (Signed)
Subjective: Patient still not feeling well.  Had episode of orthostatic hypotension this morning.  We will get hospitalist consult for this.  Postpone discharge until tomorrow.  3 Days Post-Op Procedure(s) (LRB): TOTAL HIP ARTHROPLASTY (Left)    Patient reports pain as mild.  Objective:   VITALS:   Vitals:   10/30/17 0800 10/30/17 1049  BP: 135/81 110/69  Pulse: 100   Resp: 18   Temp: 99.2 F (37.3 C)   SpO2: 92%     Neurologically intact ABD soft Neurovascular intact Sensation intact distally Intact pulses distally Dorsiflexion/Plantar flexion intact Incision: scant drainage  LABS Recent Labs    10/28/17 0359 10/29/17 0532 10/30/17 0407  HGB 11.3* 11.1* 12.0  HCT 32.8* 33.5* 35.8  WBC 10.8 12.5* 11.3*  PLT 225 205 205    Recent Labs    10/27/17 1534 10/28/17 0359  NA  --  141  K  --  3.4*  BUN  --  12  CREATININE 0.77 0.85  GLUCOSE  --  106*    Recent Labs    10/29/17 0532 10/30/17 0407  INR 1.67 1.80     Assessment/Plan: 3 Days Post-Op Procedure(s) (LRB): TOTAL HIP ARTHROPLASTY (Left)   Advance diet Up with therapy Discharge home with home health tomorrow hopefully. Hospitalist consult for blood pressure problems

## 2017-10-30 NOTE — Progress Notes (Signed)
Pt was orthostatic when working with therapy. MD Sabra Heck notified. Orders received for hospitalist consult. Order placed. Pt also  notified.

## 2017-10-30 NOTE — Progress Notes (Signed)
Physical Therapy Treatment Patient Details Name: Mary [name] MRN: [id number] DOB: 04/11/1951 Today's Date: 10/30/2017    History of Present Illness Pt is a 67 y/o F s/p L THA.  Pt's PMH includes osteoporosis.    PT Comments    This session, patient remains with orthostatic hypotension but without symptoms. 115/33mmHg seated to 88/55mmHg s/p AMB standing. HR:91 supine to 119s/p AMB standing.  Pt tolerating treatment session well, motivated and able to complete entire PT sesssion as planned. Pt continues to make progress toward goals as evidenced by stating 3 of 3 posterior precautions. Pt asks questions about TENS unit and is educated. Pt's greatest limitation continues to be LLE weakness which continues to limit ability to perform HEP with independence. Hypotension also limiting to sustained tolerance to functional mobility, AMB only 168ft this session. Patient presenting with impairment of strength, pain, range of motion, balance, and activity tolerance, limiting ability to perform ADL and mobility tasks at  baseline level of function. Patient will benefit from skilled intervention to address the above impairments and limitations, in order to restore to prior level of function, improve patient safety upon discharge, and to decrease caregiver burden.     Follow Up Recommendations  Home health PT     Equipment Recommendations  None recommended by PT    Recommendations for Other Services       Precautions / Restrictions Precautions Precautions: Fall;Posterior Hip Precaution Booklet Issued: Yes (comment) Precaution Comments: Pt able to recall 3/3 hip precautions Restrictions LLE Weight Bearing: Weight bearing as tolerated    Mobility  Bed Mobility Overal bed mobility: Needs Assistance Bed Mobility: Supine to Sit     Supine to sit: Supervision     General bed mobility comments: adherance to precautions is satisfactory   Transfers Overall transfer level: Needs  assistance Equipment used: Rolling walker (2 wheeled) Transfers: Sit to/from Stand Sit to Stand: Supervision         General transfer comment: adherance to precautions is satisfactory   Ambulation/Gait Ambulation/Gait assistance: Min guard;Supervision Ambulation Distance (Feet): 160 Feet Assistive device: Rolling walker (2 wheeled)   Gait velocity: 0.53m/s    General Gait Details: Verbal cues for transisition from 3-point gait to 2-point gait, with equalization of step length. Able to perform as directed with practice    Stairs            Wheelchair Mobility    Modified Rankin (Stroke Patients Only)       Balance                                            Cognition Arousal/Alertness: Awake/alert Behavior During Therapy: WFL for tasks assessed/performed Overall Cognitive Status: Within Functional Limits for tasks assessed                                        Exercises Total Joint Exercises Short Arc Quad: AROM;Strengthening;Left;10 reps;Supine Heel Slides: AAROM;Left;Supine;10 reps Hip ABduction/ADduction: AAROM;Left;10 reps;Supine    General Comments        Pertinent Vitals/Pain Pain Assessment: 0-10 Pain Score: 4  Pain Location: L hip Pain Descriptors / Indicators: Aching Pain Intervention(s): Limited activity within patient's tolerance;Monitored during session;Premedicated before session;Repositioned    Home Living  Prior Function            PT Goals (current goals can now be found in the care plan section) Acute Rehab PT Goals Patient Stated Goal: to return to flower gardening PT Goal Formulation: With patient Time For Goal Achievement: 11/11/17 Potential to Achieve Goals: Good Progress towards PT goals: Progressing toward goals(mobilitylimited by vitals, but safe for AMB in household distances. )    Frequency    BID      PT Plan Current plan remains appropriate     Co-evaluation              AM-PAC PT "6 Clicks" Daily Activity  Outcome Measure  Difficulty turning over in bed (including adjusting bedclothes, sheets and blankets)?: A Lot Difficulty moving from lying on back to sitting on the side of the bed? : A Lot Difficulty sitting down on and standing up from a chair with arms (e.g., wheelchair, bedside commode, etc,.)?: A Little Help needed moving to and from a bed to chair (including a wheelchair)?: A Little Help needed walking in hospital room?: A Little Help needed climbing 3-5 steps with a railing? : A Little 6 Click Score: 16    End of Session Equipment Utilized During Treatment: Gait belt Activity Tolerance: Treatment limited secondary to medical complications (Comment)(remains hypotensive and tachycardic) Patient left: with call bell/phone within reach;in chair;with chair alarm set;Other (comment) Nurse Communication: Mobility status;Other (comment) PT Visit Diagnosis: Pain;Unsteadiness on feet (R26.81);Other abnormalities of gait and mobility (R26.89);Muscle weakness (generalized) (M62.81);Difficulty in walking, not elsewhere classified (R26.2) Pain - Right/Left: Left Pain - part of body: Hip     Time: 1005-1040 PT Time Calculation (min) (ACUTE ONLY): 35 min  Charges:  $Therapeutic Exercise: 8-22 mins $Therapeutic Activity: 8-22 mins                    G Codes:       10:52 AM, 11-11-2017 Etta Grandchild, PT, DPT Physical Therapist - [facility]  [phone] ([place])     Buccola,Allan C 11/11/17, 10:49 AM

## 2017-10-31 LAB — PROTIME-INR
INR: 1.77
Prothrombin Time: 20.5 seconds — ABNORMAL HIGH (ref 11.4–15.2)

## 2017-10-31 MED ORDER — HYDROCODONE-ACETAMINOPHEN 5-325 MG PO TABS: 1.0000 | ORAL_TABLET | Freq: Four times a day (QID) | ORAL | 0 refills | Status: DC | PRN

## 2017-10-31 NOTE — Progress Notes (Addendum)
DISCHARGE NOTE:  Pt given discharge instructions and prescriptions. Pt verbalized understanding. Surgical dressing changed, TENS unit remains in place. Pts walker and bedside commode delivered to the room. Pt waiting on ride.

## 2017-10-31 NOTE — Progress Notes (Signed)
[place] [facility] [place] NAME: Mary [name]    MR#:  [id number]  DATE OF BIRTH:  1951-05-02  SUBJECTIVE:   Patient without SOB or chest pain No dizziness this am Does not want to wear TED hose  REVIEW OF SYSTEMS:    Review of Systems  Constitutional: Negative for fever, chills weight loss HENT: Negative for ear pain, nosebleeds, congestion, facial swelling, rhinorrhea, neck pain, neck stiffness and ear discharge.   Respiratory: Negative for cough, shortness of breath, wheezing  Cardiovascular: Negative for chest pain, palpitations and leg swelling.  Gastrointestinal: Negative for heartburn, abdominal pain, vomiting, diarrhea or consitpation Genitourinary: Negative for dysuria, urgency, frequency, hematuria Musculoskeletal: Negative for back pain or joint pain Neurological: Negative for dizziness, seizures, syncope, focal weakness,  numbness and headaches.  Hematological: Does not bruise/bleed easily.  Psychiatric/Behavioral: Negative for hallucinations, confusion, dysphoric mood    Tolerating Diet: yes      DRUG ALLERGIES:  No Known Allergies  VITALS:  Blood pressure 132/68, pulse (!) 109, temperature 98.6 F (37 C), temperature source Oral, resp. rate 18, height 5\' 4"  (1.626 m), weight 56.7 kg (125 lb), SpO2 93 %.  PHYSICAL EXAMINATION:  Constitutional: Appears well-developed and well-nourished. No distress. HENT: Normocephalic. Marland Kitchen Oropharynx is clear and moist.  Eyes: Conjunctivae and EOM are normal. PERRLA, no scleral icterus.  Neck: Normal ROM. Neck supple. No JVD. No tracheal deviation. CVS: RRR, S1/S2 +, no murmurs, no gallops, no carotid bruit.  Pulmonary: Effort and breath sounds normal, no stridor, rhonchi, wheezes, rales.  Abdominal: Soft. BS +,  no distension, tenderness, rebound or guarding.  Musculoskeletal: Normal range of motion. No edema and no tenderness.  Neuro: Alert. CN 2-12 grossly intact. No focal  deficits. Skin: Skin is warm and dry. No rash noted. Psychiatric: Normal mood and affect.      LABORATORY PANEL:   CBC Recent Labs  Lab 10/30/17 0407  WBC 11.3*  HGB 12.0  HCT 35.8  PLT 205   ------------------------------------------------------------------------------------------------------------------  Chemistries  Recent Labs  Lab 10/28/17 0359  NA 141  K 3.4*  CL 108  CO2 26  GLUCOSE 106*  BUN 12  CREATININE 0.85  CALCIUM 8.1*   ------------------------------------------------------------------------------------------------------------------  Cardiac Enzymes No results for input(s): TROPONINI in the last 168 hours. ------------------------------------------------------------------------------------------------------------------  RADIOLOGY:  Dg Chest Port 1 View  Result Date: 10/30/2017 CLINICAL DATA:  Smoker, fever status post hip replacement on 10/27/2017 EXAM: PORTABLE CHEST 1 VIEW COMPARISON:  09/15/2010 FINDINGS: Lungs are clear.  No pleural effusion or pneumothorax. The heart is normal in size. IMPRESSION: No evidence of acute cardiopulmonary disease. Electronically Signed   By: Julian Hy M.D.   On: 10/30/2017 16:41     ASSESSMENT AND PLAN:    67 year old female with a history of essential hypertension status post elective left hip replacement on 3/27.  1. Orthostatic hypotension: Orthostatic vitals do this morning The patient should wear TED hose Discontinue Norvasc Patient will need outpatient follow-up with her primary care physician and consider adrenal workup. Patient given instructions for orthostatic hypotension.  2. Essential hypertension: Hold Norvasc for now  3. PAD: Continue Coumadin  4. Hyperlipidemia: Continue statin    Management plans discussed with the patient and she is in agreement.  CODE STATUS: full  TOTAL TIME TAKING CARE OF THIS PATIENT: 28 minutes.     POSSIBLE D/C today, DEPENDING ON VSS  Mary [name]  M.D on 10/31/2017 at 7:54 AM  Between 7am to 6pm -  Pager - [phone] After 6pm go to [email or web address] - password [email or web address] [facility]  Office  [phone]  CC: Primary care physician; Arnetha Courser, MD  Note: This dictation was prepared with Dragon dictation along with smaller phrase technology. Any transcriptional errors that result from this process are unintentional.

## 2017-10-31 NOTE — Discharge Summary (Signed)
Physician Discharge Summary  Patient ID: Mary [name] MRN: [id number] DOB/AGE: 65/27/1952 65 y.o.  Admit date: 10/27/2017 Discharge date: 10/31/2017  Admission Diagnoses: Advanced osteoarthritis left hip  Discharge Diagnoses: Same Active Problems:   Hip arthritis   Discharged Condition: good  Hospital Course: Left total hip replacement 12/07/8117 without complication.  Postop x-rays excellent.  He made excellent progress with PT.  She had some orthostatic hypotension which kept her in the hospital an extra day.  She is stable and ready for discharge home today.  We will get home health PT.  Consults: Hospitalist services  Significant Diagnostic Studies: radiology: X-Ray: Satisfactory postop total hip  Treatments: antibiotics: vancomycin  Discharge Exam: Blood pressure 102/65, pulse (!) 105, temperature 98.4 F (36.9 C), temperature source Oral, resp. rate 18, height 5\' 4"  (1.626 m), weight 56.7 kg (125 lb), SpO2 93 %.   Dressing is dry.  Hip is stable with minimal pain on movement.  Neurovascular status normal.  Disposition:   Discharge Instructions    Call MD for:  persistant nausea and vomiting   Complete by:  As directed    Call MD for:  redness, tenderness, or signs of infection (pain, swelling, redness, odor or green/yellow discharge around incision site)   Complete by:  As directed    Call MD for:  severe uncontrolled pain   Complete by:  As directed    Call MD for:  temperature >100.4   Complete by:  As directed    Diet general   Complete by:  As directed    Discharge instructions   Complete by:  As directed    50% weightbearing left leg Resume normal Coumadin dosage Return to clinic as appointed   Increase activity slowly   Complete by:  As directed    Leave dressing on - Keep it clean, dry, and intact until clinic visit   Complete by:  As directed      Allergies as of 10/31/2017   No Known Allergies     Medication List    STOP taking these medications    amLODipine 5 MG tablet Commonly known as:  NORVASC     TAKE these medications   acetaminophen 500 MG tablet Commonly known as:  TYLENOL Take 500 mg by mouth 2 (two) times daily as needed for moderate pain or headache.   atorvastatin 10 MG tablet Commonly known as:  LIPITOR TAKE 1 TABLET BY MOUTH ONCE DAILY   ferrous sulfate 325 (65 FE) MG tablet Take 325 mg by mouth daily.   gabapentin 300 MG capsule Commonly known as:  NEURONTIN TAKE ONE CAPSULE BY MOUTH TWICE DAILY What changed:    how much to take  how to take this  when to take this   HYDROcodone-acetaminophen 5-325 MG tablet Commonly known as:  NORCO Take 1 tablet by mouth every 6 (six) hours as needed.   vitamin E 400 UNIT capsule Take 400 Units by mouth daily.   warfarin 5 MG tablet Commonly known as:  COUMADIN TAKE AS DIRECTED What changed:  when to take this            Durable Medical Equipment  (From admission, onward)        Start     Ordered   10/31/17 1436  DME 3-in-1  Once     10/31/17 1440   10/27/17 1518  DME Walker rolling  Once    Question:  Patient needs a walker to treat with the following condition  Answer:  Status post total hip replacement, left   10/27/17 1517       Signed: Reina Wilton E 10/31/2017, 2:43 PM

## 2017-10-31 NOTE — Progress Notes (Signed)
Physical Therapy Treatment Patient Details Name: Mary [name] MRN: [id number] DOB: 1951/05/07 Today's Date: 10/31/2017    History of Present Illness Pt is a 67 y/o F s/p L THA.  Pt's PMH includes osteoporosis.    PT Comments    Discussed with nurse prior to session.  Orthostatic vitals recently taken.  Orthostatic by asymptomatic. Participated in exercises as described below.  To edge of bed with increased time but no assist.  Stood with min assist/min guard to walker.  She was able to ambulate to commode and then around nursing unit with slow steady gait.  No LOB or buckling noted.  Pt with no c/o dizziness.  Generally tolerated well.  BP taken upon return to room 102/65 P 105.  Pt voiced concerns over irregular BP.  Encouragement given.   Follow Up Recommendations  Home health PT     Equipment Recommendations       Recommendations for Other Services       Precautions / Restrictions Precautions Precautions: Fall;Posterior Hip Precaution Comments: Pt able to recall 3/3 hip precautions Restrictions Weight Bearing Restrictions: No LLE Weight Bearing: Weight bearing as tolerated    Mobility  Bed Mobility Overal bed mobility: Needs Assistance Bed Mobility: Supine to Sit     Supine to sit: Supervision;Modified independent (Device/Increase time)        Transfers Overall transfer level: Needs assistance Equipment used: Rolling walker (2 wheeled) Transfers: Sit to/from Stand Sit to Stand: Supervision;Min guard            Ambulation/Gait Ambulation/Gait assistance: Min guard;Supervision Ambulation Distance (Feet): 220 Feet Assistive device: Rolling walker (2 wheeled) Gait Pattern/deviations: Step-to pattern;Step-through pattern;Decreased step length - right;Decreased step length - left   Gait velocity interpretation: Below normal speed for age/gender General Gait Details: gait progressed to step-through pattern with distance   Stairs         General stair  comments: declined stair training.  had completed in prior session and stated she was comfortable.  Wheelchair Mobility    Modified Rankin (Stroke Patients Only)       Balance Overall balance assessment: Modified Independent   Sitting balance-Leahy Scale: Good     Standing balance support: Single extremity supported Standing balance-Leahy Scale: Fair                              Cognition Arousal/Alertness: Awake/alert Behavior During Therapy: WFL for tasks assessed/performed Overall Cognitive Status: Within Functional Limits for tasks assessed                                        Exercises Total Joint Exercises Ankle Circles/Pumps: AROM;Both;10 reps;Supine Quad Sets: Strengthening;Both;10 reps;Supine Gluteal Sets: Both;10 reps;Supine;Strengthening Short Arc Quad: AROM;Strengthening;Left;10 reps;Supine Heel Slides: AAROM;Left;Supine;10 reps Hip ABduction/ADduction: AAROM;Left;10 reps;Supine Straight Leg Raises: 10 reps;AAROM;Supine;Left Other Exercises Other Exercises: to commode to void prior to gait.    General Comments        Pertinent Vitals/Pain Pain Assessment: 0-10 Pain Score: 2  Pain Location: L hip Pain Descriptors / Indicators: Sore Pain Intervention(s): Limited activity within patient's tolerance    Home Living                      Prior Function            PT Goals (current goals can now be  found in the care plan section) Progress towards PT goals: Progressing toward goals    Frequency    BID      PT Plan Current plan remains appropriate    Co-evaluation              AM-PAC PT "6 Clicks" Daily Activity  Outcome Measure  Difficulty turning over in bed (including adjusting bedclothes, sheets and blankets)?: A Little Difficulty moving from lying on back to sitting on the side of the bed? : A Little Difficulty sitting down on and standing up from a chair with arms (e.g., wheelchair, bedside  commode, etc,.)?: A Little Help needed moving to and from a bed to chair (including a wheelchair)?: A Little Help needed walking in hospital room?: A Little Help needed climbing 3-5 steps with a railing? : A Little 6 Click Score: 18    End of Session Equipment Utilized During Treatment: Gait belt Activity Tolerance: Patient tolerated treatment well Patient left: in chair;with chair alarm set;with call bell/phone within reach Nurse Communication: Other (comment) Pain - Right/Left: Left Pain - part of body: Hip     Time: 7494-4967 PT Time Calculation (min) (ACUTE ONLY): 35 min  Charges:  $Gait Training: 8-22 mins $Therapeutic Exercise: 8-22 mins                    G Codes:       Chesley Noon, PTA 10/31/17, 10:23 AM

## 2017-10-31 NOTE — Care Management Note (Addendum)
Case Management Note  Patient Details  Name: Mary [name] MRN: [id number] Date of Birth: [birth year]  Subjective/Objective:      Referral for HH=PT, BSC, and RW was called to Melene Muller at [facility]. [name]'s nurse was updated to hold discharge until DME arrives if [name] agrees.                Action/Plan:   Expected Discharge Date:  10/31/17               Expected Discharge Plan:  [place]  In-House Referral:     Discharge planning Services  CM Consult  Post Acute Care Choice:  Home Health Choice offered to:  Patient  DME Arranged:  3-N-1 DME Agency:  [place]:  PT [facility] Agency:  [place]  Status of Service:  Completed, signed off  If discussed at [place] of Stay Meetings, dates discussed:    Additional Comments:  Andora Krull A, RN 10/31/2017, 2:54 PM

## 2017-10-31 NOTE — Progress Notes (Addendum)
Subjective: Hip feels well.  BP better.  Meds stopped. Will see PCP this week.    4 Days Post-Op Procedure(s) (LRB): TOTAL HIP ARTHROPLASTY (Left)    Patient reports pain as mild.  Objective: HIp stable. Dressing dry.  csm good.    VITALS:   Vitals:   10/31/17 0700 10/31/17 1017  BP: 127/78 102/65  Pulse: 99 (!) 105  Resp:    Temp: 98.4 F (36.9 C)   SpO2:        LABS Recent Labs    10/29/17 0532 10/30/17 0407  HGB 11.1* 12.0  HCT 33.5* 35.8  WBC 12.5* 11.3*  PLT 205 205    No results for input(s): NA, K, BUN, CREATININE, GLUCOSE in the last 72 hours.  Recent Labs    10/30/17 0407 10/31/17 0418  INR 1.80 1.77     Assessment/Plan: 4 Days Post-Op Procedure(s) (LRB): TOTAL HIP ARTHROPLASTY (Left)   Up with therapy Discharge home with home health  Resume normal Coumadin dosage  RTC  This week

## 2017-11-01 ENCOUNTER — Telehealth: Payer: Self-pay | Admitting: Family Medicine

## 2017-11-01 NOTE — Telephone Encounter (Signed)
Copied from [place]. Topic: Quick Communication - See Telephone Encounter >> Nov 01, 2017  1:26 PM Mary [name], [place] wrote: CRM for notification. See Telephone encounter for:   11/01/17.pt called and stated that she was instructed to stop taking her amlodipine 5 mg until she can be seen by her PCP pt stated that her B/P was fluctuating after her hip replacement surgery pt has appt on 11/05/17 and would like to know if she should not take meds until then or what she should do please call pt @[phone] 

## 2017-11-02 ENCOUNTER — Telehealth: Payer: Self-pay

## 2017-11-02 DIAGNOSIS — I951 Orthostatic hypotension: Secondary | ICD-10-CM | POA: Diagnosis not present

## 2017-11-02 DIAGNOSIS — Z471 Aftercare following joint replacement surgery: Secondary | ICD-10-CM | POA: Diagnosis not present

## 2017-11-02 DIAGNOSIS — Z96642 Presence of left artificial hip joint: Secondary | ICD-10-CM | POA: Diagnosis not present

## 2017-11-02 DIAGNOSIS — M1612 Unilateral primary osteoarthritis, left hip: Secondary | ICD-10-CM | POA: Diagnosis not present

## 2017-11-02 NOTE — Telephone Encounter (Signed)
Called pt informed her of the information below. Pt gave verbal understanding.  

## 2017-11-02 NOTE — Telephone Encounter (Signed)
Duplicate; CMA just talked to her

## 2017-11-02 NOTE — Telephone Encounter (Signed)
Just monitor BP twice a day at home; call if any highs or low (under 100 on top or over 150 on top) We'll see her in a few days

## 2017-11-02 NOTE — Telephone Encounter (Signed)
Copied from [place]. Topic: Quick Communication - See Telephone Encounter >> Nov 01, 2017  1:26 PM Lolita Rieger, [place] wrote: CRM for notification. See Telephone encounter for:   11/01/17.pt called and stated that she was instructed to stop taking her amlodipine 5 mg until she can be seen by her PCP pt stated that her B/P was fluctuating after her hip replacement surgery pt has appt on 11/05/17 and would like to know if she should not take meds until then or what she should do please call pt @[phone]    Please see above and advise. Thanks

## 2017-11-04 DIAGNOSIS — Z471 Aftercare following joint replacement surgery: Secondary | ICD-10-CM | POA: Diagnosis not present

## 2017-11-04 DIAGNOSIS — I951 Orthostatic hypotension: Secondary | ICD-10-CM | POA: Diagnosis not present

## 2017-11-04 DIAGNOSIS — M1612 Unilateral primary osteoarthritis, left hip: Secondary | ICD-10-CM | POA: Diagnosis not present

## 2017-11-04 DIAGNOSIS — Z96642 Presence of left artificial hip joint: Secondary | ICD-10-CM | POA: Diagnosis not present

## 2017-11-05 ENCOUNTER — Other Ambulatory Visit (INDEPENDENT_AMBULATORY_CARE_PROVIDER_SITE_OTHER): Payer: Self-pay | Admitting: Vascular Surgery

## 2017-11-05 ENCOUNTER — Encounter: Payer: Self-pay | Admitting: Family Medicine

## 2017-11-05 ENCOUNTER — Ambulatory Visit (INDEPENDENT_AMBULATORY_CARE_PROVIDER_SITE_OTHER): Payer: Medicare HMO | Admitting: Family Medicine

## 2017-11-05 DIAGNOSIS — I10 Essential (primary) hypertension: Secondary | ICD-10-CM | POA: Diagnosis not present

## 2017-11-05 MED ORDER — BLOOD PRESSURE KIT DEVI: 1.0000 | Freq: Two times a day (BID) | 0 refills | Status: DC

## 2017-11-05 NOTE — Progress Notes (Signed)
BP 130/82   Pulse 99   Temp 98 F (36.7 C) (Oral)   Resp 14   Wt 126 lb 6.4 oz (57.3 kg)   SpO2 96%   BMI 21.70 kg/m    Subjective:    Patient ID: Mary [name], female    DOB: Jul 27, 1951, 67 y.o.   MRN: [id number]  HPI: Mary [name] is a 67 y.o. female  Chief Complaint  Patient presents with  . Follow-up  . Hypertension    HPI She was in the hospital; blood pressure dropped in the hospital after surgery Orthopaedist consulted medical doctor Told to hold BP medicine utnil she saw me today No home BP cuff Does not eat much salt, tries to stay away from salt Eats potato chips, but not every day, takes spurts Left hip replacement; they replaced her bandage at the ortho office today; they did xray Doing home PT now for another week or two, then will start at ortho office   Depression screen [facility] 2/9 11/05/2017 09/27/2017 07/08/2017 11/13/2016 11/14/2015  Decreased Interest 0 0 0 0 0  Down, Depressed, Hopeless 0 0 0 0 0  PHQ - 2 Score 0 0 0 0 0    Relevant past medical, surgical, family and social history reviewed Past Medical History:  Diagnosis Date  . Arthritis   . Hyperlipidemia   . Hypertension   . Hyperuricemia   . Osteoporosis   . PAD (peripheral artery disease) ([place]) 02/2010   angiplasty, popliteal artery  . Tobacco abuse    Past Surgical History:  Procedure Laterality Date  . ANGIOPLASTY  July 2011   PAD- popliteal artery  . Leg Bypass Surgery Right 08/2010  . OVARIAN CYST SURGERY  1980s  . POPLITEAL ARTERY STENT Right 05/2010  . POPLITEAL ARTERY STENT Right 07/2010  . TOTAL HIP ARTHROPLASTY Left 10/27/2017   Procedure: TOTAL HIP ARTHROPLASTY;  Surgeon: Earnestine Leys, MD;  Location: [facility] ORS;  Service: Orthopedics;  Laterality: Left;   Family History  Problem Relation Age of Onset  . Arthritis Mother   . Diabetes Mother   . Stroke Mother   . Hypertension Mother   . Cancer Father   . Arthritis Sister   . Seizures Sister   . Diabetes Sister   .  Hypertension Sister   . COPD Sister   . Cancer Brother   . Stroke Maternal Grandmother   . Diabetes Paternal Grandmother   . Cancer Sister   . Diabetes Sister   . Diabetes Brother   . Diabetes Brother   . Heart disease Neg Hx   . Breast cancer Neg Hx    Social History   Tobacco Use  . Smoking status: Current Every Day Smoker    Packs/day: 0.50    Types: Cigarettes  . Smokeless tobacco: Never Used  Substance Use Topics  . Alcohol use: No  . Drug use: No    Interim medical history since last visit reviewed. Allergies and medications reviewed  Review of Systems Per HPI unless specifically indicated above     Objective:    BP 130/82   Pulse 99   Temp 98 F (36.7 C) (Oral)   Resp 14   Wt 126 lb 6.4 oz (57.3 kg)   SpO2 96%   BMI 21.70 kg/m   Wt Readings from Last 3 Encounters:  11/05/17 126 lb 6.4 oz (57.3 kg)  10/27/17 125 lb (56.7 kg)  10/13/17 124 lb (56.2 kg)    Physical Exam  Constitutional:  She appears well-developed and well-nourished. No distress.  Cardiovascular: Normal rate and regular rhythm.  Pulmonary/Chest: Effort normal and breath sounds normal.  Musculoskeletal: She exhibits no edema.  Psychiatric: She has a normal mood and affect.      Assessment & Plan:   Problem List Items Addressed This Visit      Cardiovascular and Mediastinum   Hypertension (Chronic)    Patient to monitor her BP at home; start back on BP if top number exceeds 140; then contact me if not controlled on medicine and going above 140 for further adjustment; she agrees; smoking cessation, DASH guidelines      Relevant Medications   amLODipine (NORVASC) 5 MG tablet       Follow up plan: Return in about 3 weeks (around 11/26/2017) for follow-up visit with Dr. Sanda Klein.  An after-visit summary was printed and given to the patient at [place].  Please see the patient instructions which may contain other information and recommendations beyond what is mentioned above in the  assessment and plan.  Meds ordered this encounter  Medications  . Blood Pressure Monitoring (BLOOD PRESSURE KIT) DEVI    Sig: 1 each by Does not apply route 2 (two) times daily. Fluctuating blood pressures, DX I10    Dispense:  1 Device    Refill:  0    No orders of the defined types were placed in this encounter.

## 2017-11-05 NOTE — Patient Instructions (Signed)
Start back on your blood pressure if the top number goes over 140 Monitor your blood pressure twice a day and call if up over 140 Try to follow the DASH guidelines (DASH stands for Dietary Approaches to Stop Hypertension). Try to limit the sodium in your diet to no more than 1,500mg  of sodium per day. Certainly try to not exceed 2,000 mg per day at the very most. Do not add salt when cooking or at the table.  Check the sodium amount on labels when shopping, and choose items lower in sodium when given a choice. Avoid or limit foods that already contain a lot of sodium. Eat a diet rich in fruits and vegetables and whole grains, and try to lose weight if overweight or obese

## 2017-11-14 NOTE — Assessment & Plan Note (Signed)
Patient to monitor her BP at home; start back on BP if top number exceeds 140; then contact me if not controlled on medicine and going above 140 for further adjustment; she agrees; smoking cessation, DASH guidelines

## 2017-11-29 ENCOUNTER — Telehealth: Payer: Self-pay | Admitting: Family Medicine

## 2017-11-29 NOTE — Telephone Encounter (Signed)
Pt states [facility] did recv'd the BP monitor and would like to know if it could be re-sent.  Also please contact pt once it has been sent.  [place] [address], [place] [phone] (Phone) [phone] (Fax)

## 2017-12-03 ENCOUNTER — Ambulatory Visit (INDEPENDENT_AMBULATORY_CARE_PROVIDER_SITE_OTHER): Payer: Medicare HMO | Admitting: Family Medicine

## 2017-12-03 ENCOUNTER — Encounter: Payer: Self-pay | Admitting: Family Medicine

## 2017-12-03 VITALS — BP 138/84 | HR 95 | Temp 98.3°F | Resp 14 | Ht 66.0 in | Wt 126.6 lb

## 2017-12-03 DIAGNOSIS — Z720 Tobacco use: Secondary | ICD-10-CM

## 2017-12-03 DIAGNOSIS — I739 Peripheral vascular disease, unspecified: Secondary | ICD-10-CM

## 2017-12-03 DIAGNOSIS — I10 Essential (primary) hypertension: Secondary | ICD-10-CM

## 2017-12-03 DIAGNOSIS — Z1211 Encounter for screening for malignant neoplasm of colon: Secondary | ICD-10-CM

## 2017-12-03 NOTE — Assessment & Plan Note (Signed)
Patient will monitor, use the CCB if BP going up; otherwise, DASH guidelines

## 2017-12-03 NOTE — Assessment & Plan Note (Signed)
Patient is not ready to quit; I am here to help if/when ready

## 2017-12-03 NOTE — Patient Instructions (Signed)
Try to follow the DASH guidelines (DASH stands for Dietary Approaches to Stop Hypertension). Try to limit the sodium in your diet to no more than 1,500mg of sodium per day. Certainly try to not exceed 2,000 mg per day at the very most. Do not add salt when cooking or at the table.  Check the sodium amount on labels when shopping, and choose items lower in sodium when given a choice. Avoid or limit foods that already contain a lot of sodium. Eat a diet rich in fruits and vegetables and whole grains, and try to lose weight if overweight or obese  

## 2017-12-03 NOTE — Assessment & Plan Note (Signed)
Managed by Dr. Lucky Cowboy, on coumadin

## 2017-12-03 NOTE — Progress Notes (Signed)
BP 138/84   Pulse 95   Temp 98.3 F (36.8 C) (Oral)   Resp 14   Ht 5\' 6"  (1.676 m)   Wt 126 lb 9.6 oz (57.4 kg)   SpO2 97%   BMI 20.43 kg/m    Subjective:    Patient ID: Mary [name], female    DOB: 1951-07-22, 67 y.o.   MRN: [id number]  HPI: Mary [name] is a 67 y.o. female  Chief Complaint  Patient presents with  . Follow-up    HPI Here for follow-up Blood pressure had been fluctuating but it's behaving itself Checking BP, "it's been running good" Using the amlodipine just if needed; not taking right now Not using a lot of salt; just a bit No decongestants No black licorice or jelly beans Strong fam hx of HTN No chest pain; no swelling in legs Still taking iron Dr. Lucky Cowboy manages coumadin; no bruising or bleeding  Depression screen [facility] 2/9 12/03/2017 11/05/2017 09/27/2017 07/08/2017 11/13/2016  Decreased Interest 0 0 0 0 0  Down, Depressed, Hopeless 0 0 0 0 0  PHQ - 2 Score 0 0 0 0 0    Relevant past medical, surgical, family and social history reviewed Past Medical History:  Diagnosis Date  . Arthritis   . Hyperlipidemia   . Hypertension   . Hyperuricemia   . Osteoporosis   . PAD (peripheral artery disease) ([place]) 02/2010   angiplasty, popliteal artery  . Tobacco abuse    Past Surgical History:  Procedure Laterality Date  . ANGIOPLASTY  July 2011   PAD- popliteal artery  . Leg Bypass Surgery Right 08/2010  . OVARIAN CYST SURGERY  1980s  . POPLITEAL ARTERY STENT Right 05/2010  . POPLITEAL ARTERY STENT Right 07/2010  . TOTAL HIP ARTHROPLASTY Left 10/27/2017   Procedure: TOTAL HIP ARTHROPLASTY;  Surgeon: Earnestine Leys, MD;  Location: [facility] ORS;  Service: Orthopedics;  Laterality: Left;   Family History  Problem Relation Age of Onset  . Arthritis Mother   . Diabetes Mother   . Stroke Mother   . Hypertension Mother   . Cancer Father   . Arthritis Sister   . Seizures Sister   . Diabetes Sister   . Hypertension Sister   . COPD Sister   . Cancer Brother     . Stroke Maternal Grandmother   . Diabetes Paternal Grandmother   . Cancer Sister   . Diabetes Sister   . Diabetes Brother   . Diabetes Brother   . Heart disease Neg Hx   . Breast cancer Neg Hx    Social History   Tobacco Use  . Smoking status: Current Every Day Smoker    Packs/day: 0.50    Types: Cigarettes  . Smokeless tobacco: Never Used  Substance Use Topics  . Alcohol use: No  . Drug use: No  she is not ready to quit  Interim medical history since last visit reviewed. Allergies and medications reviewed  Review of Systems Per HPI unless specifically indicated above     Objective:    BP 138/84   Pulse 95   Temp 98.3 F (36.8 C) (Oral)   Resp 14   Ht 5\' 6"  (1.676 m)   Wt 126 lb 9.6 oz (57.4 kg)   SpO2 97%   BMI 20.43 kg/m   Wt Readings from Last 3 Encounters:  12/03/17 126 lb 9.6 oz (57.4 kg)  11/05/17 126 lb 6.4 oz (57.3 kg)  10/27/17 125 lb (56.7 kg)  Physical Exam  Constitutional: She appears well-developed and well-nourished.  HENT:  Mouth/Throat: Mucous membranes are normal.  Eyes: EOM are normal. No scleral icterus.  Cardiovascular: Normal rate and regular rhythm.  Pulmonary/Chest: Effort normal and breath sounds normal.  Psychiatric: She has a normal mood and affect. Her behavior is normal.      Assessment & Plan:   Problem List Items Addressed This Visit      Cardiovascular and Mediastinum   PAD (peripheral artery disease) (HCC) (Chronic)    Managed by Dr. Lucky Cowboy, on coumadin      Relevant Medications   amLODipine (NORVASC) 5 MG tablet   Hypertension - Primary (Chronic)    Patient will monitor, use the CCB if BP going up; otherwise, DASH guidelines      Relevant Medications   amLODipine (NORVASC) 5 MG tablet     Other   Tobacco abuse (Chronic)    Patient is not ready to quit; I am here to help if/when ready       Other Visit Diagnoses    Screen for colon cancer       Relevant Orders   Ambulatory referral to Gastroenterology        Follow up plan: Return in about 6 months (around 06/04/2018) for follow-up visit with Dr. Sanda Klein; complete physical in the next few months.  An after-visit summary was printed and given to the patient at [place].  Please see the patient instructions which may contain other information and recommendations beyond what is mentioned above in the assessment and plan.  No orders of the defined types were placed in this encounter.   Orders Placed This Encounter  Procedures  . Ambulatory referral to Gastroenterology

## 2017-12-06 ENCOUNTER — Telehealth: Payer: Self-pay

## 2017-12-06 ENCOUNTER — Other Ambulatory Visit: Payer: Self-pay

## 2017-12-06 DIAGNOSIS — Z1211 Encounter for screening for malignant neoplasm of colon: Secondary | ICD-10-CM

## 2017-12-06 NOTE — Telephone Encounter (Signed)
Gastroenterology Pre-Procedure Review  Request Date: 01/11/18 Requesting Physician: Dr. Vicente Males  PATIENT REVIEW QUESTIONS: The patient responded to the following health history questions as indicated:    1. Are you having any GI issues? yes (sometimes blood in stool) 2. Do you have a personal history of Polyps? no 3. Do you have a family history of Colon Cancer or Polyps? yes (sister polyps) 4. Diabetes Mellitus? no 5. Joint replacements in the past 12 months?yes (10/27/17 [facility] HIP REPLACEMENT) 6. Major health problems in the past 3 months?no 7. Any artificial heart valves, MVP, or defibrillator?no    MEDICATIONS & ALLERGIES:    Patient reports the following regarding taking any anticoagulation/antiplatelet therapy:   Plavix, Coumadin, Eliquis, Xarelto, Lovenox, Pradaxa, Brilinta, or Effient? yes (Coumadin- Blood Thinner Request Sent to Dr. Bunnie Domino Electronically) Aspirin? no  Patient confirms/reports the following medications:  Current Outpatient Medications  Medication Sig Dispense Refill  . acetaminophen (TYLENOL) 500 MG tablet Take 500 mg by mouth 2 (two) times daily as needed for moderate pain or headache.    Marland Kitchen amLODipine (NORVASC) 5 MG tablet Take 1 tablet (5 mg total) by mouth daily. If top number 140 or higher    . atorvastatin (LIPITOR) 10 MG tablet TAKE 1 TABLET BY MOUTH ONCE DAILY 90 tablet 2  . Blood Pressure Monitoring (BLOOD PRESSURE KIT) DEVI 1 each by Does not apply route 2 (two) times daily. Fluctuating blood pressures, DX I10 1 Device 0  . ferrous sulfate 325 (65 FE) MG tablet Take 325 mg by mouth daily.    Marland Kitchen gabapentin (NEURONTIN) 300 MG capsule TAKE ONE CAPSULE BY MOUTH TWICE DAILY (Patient taking differently: Take 300 mgs by mouth once daily) 60 capsule 11  . HYDROcodone-acetaminophen (NORCO) 5-325 MG tablet Take 1 tablet by mouth every 6 (six) hours as needed. (Patient not taking: Reported on 11/05/2017) 40 tablet 0  . vitamin E 400 UNIT capsule Take 400 Units by mouth  daily.    Marland Kitchen warfarin (COUMADIN) 5 MG tablet TAKE AS DIRECTED 90 tablet 2   No current facility-administered medications for this visit.     Patient confirms/reports the following allergies:  No Known Allergies  No orders of the defined types were placed in this encounter.   AUTHORIZATION INFORMATION Primary Insurance: 1D#: Group #:  Secondary Insurance: 1D#: Group #:  SCHEDULE INFORMATION: Date: 01/11/18 Time: Location:[facility]

## 2017-12-30 DIAGNOSIS — Z96642 Presence of left artificial hip joint: Secondary | ICD-10-CM | POA: Diagnosis not present

## 2018-01-11 ENCOUNTER — Encounter: Payer: Self-pay | Admitting: Certified Registered"

## 2018-01-11 ENCOUNTER — Ambulatory Visit
Admission: RE | Admit: 2018-01-11 | Discharge: 2018-01-11 | Disposition: A | Discharge status: Home / Self Care | Payer: Medicare HMO | Source: Ambulatory Visit | Attending: Gastroenterology | Admitting: Gastroenterology

## 2018-01-11 ENCOUNTER — Other Ambulatory Visit: Payer: Self-pay

## 2018-01-11 ENCOUNTER — Telehealth: Payer: Self-pay

## 2018-01-11 ENCOUNTER — Encounter
Admission: RE | Disposition: A | Discharge status: Home / Self Care | Payer: Self-pay | Source: Ambulatory Visit | Attending: Gastroenterology

## 2018-01-11 DIAGNOSIS — Z539 Procedure and treatment not carried out, unspecified reason: Secondary | ICD-10-CM | POA: Diagnosis not present

## 2018-01-11 DIAGNOSIS — Z1211 Encounter for screening for malignant neoplasm of colon: Secondary | ICD-10-CM

## 2018-01-11 SURGERY — COLONOSCOPY WITH PROPOFOL
Anesthesia: General

## 2018-01-11 MED ORDER — PHENYLEPHRINE HCL 10 MG/ML IJ SOLN
INTRAMUSCULAR | Status: AC
  Filled 2018-01-11: qty 1

## 2018-01-11 MED ORDER — SODIUM CHLORIDE 0.9 % IV SOLN: INTRAVENOUS | Status: DC

## 2018-01-11 MED ORDER — LIDOCAINE HCL (PF) 2 % IJ SOLN
INTRAMUSCULAR | Status: AC
Start: 2018-01-11 — End: 2018-01-11
  Filled 2018-01-11: qty 10

## 2018-01-11 MED ORDER — PROPOFOL 500 MG/50ML IV EMUL
INTRAVENOUS | Status: AC
  Filled 2018-01-11: qty 50

## 2018-01-11 NOTE — Telephone Encounter (Signed)
LVM for pt to call me back to schedule colonoscopy-informed her that we have availability at [facility] for tomorrow with Dr. Marius Ditch.  Awaiting Call back.  Thanks [employer]

## 2018-01-12 ENCOUNTER — Encounter: Payer: Self-pay | Admitting: *Deleted

## 2018-01-13 ENCOUNTER — Other Ambulatory Visit: Payer: Self-pay

## 2018-01-13 ENCOUNTER — Ambulatory Visit: Payer: Medicare HMO | Admitting: Anesthesiology

## 2018-01-13 ENCOUNTER — Encounter: Payer: Self-pay | Admitting: *Deleted

## 2018-01-13 ENCOUNTER — Ambulatory Visit
Admission: RE | Admit: 2018-01-13 | Discharge: 2018-01-13 | Disposition: A | Discharge status: Home / Self Care | Payer: Medicare HMO | Source: Ambulatory Visit | Attending: Gastroenterology | Admitting: Gastroenterology

## 2018-01-13 ENCOUNTER — Encounter
Admission: RE | Disposition: A | Discharge status: Home / Self Care | Payer: Self-pay | Source: Ambulatory Visit | Attending: Gastroenterology

## 2018-01-13 DIAGNOSIS — J449 Chronic obstructive pulmonary disease, unspecified: Secondary | ICD-10-CM | POA: Diagnosis not present

## 2018-01-13 DIAGNOSIS — Z96642 Presence of left artificial hip joint: Secondary | ICD-10-CM | POA: Insufficient documentation

## 2018-01-13 DIAGNOSIS — Z79899 Other long term (current) drug therapy: Secondary | ICD-10-CM | POA: Insufficient documentation

## 2018-01-13 DIAGNOSIS — K644 Residual hemorrhoidal skin tags: Secondary | ICD-10-CM | POA: Diagnosis not present

## 2018-01-13 DIAGNOSIS — E785 Hyperlipidemia, unspecified: Secondary | ICD-10-CM | POA: Insufficient documentation

## 2018-01-13 DIAGNOSIS — F17210 Nicotine dependence, cigarettes, uncomplicated: Secondary | ICD-10-CM | POA: Insufficient documentation

## 2018-01-13 DIAGNOSIS — I739 Peripheral vascular disease, unspecified: Secondary | ICD-10-CM | POA: Insufficient documentation

## 2018-01-13 DIAGNOSIS — Z95820 Peripheral vascular angioplasty status with implants and grafts: Secondary | ICD-10-CM | POA: Insufficient documentation

## 2018-01-13 DIAGNOSIS — Q439 Congenital malformation of intestine, unspecified: Secondary | ICD-10-CM | POA: Insufficient documentation

## 2018-01-13 DIAGNOSIS — K635 Polyp of colon: Secondary | ICD-10-CM | POA: Diagnosis not present

## 2018-01-13 DIAGNOSIS — Z7901 Long term (current) use of anticoagulants: Secondary | ICD-10-CM | POA: Diagnosis not present

## 2018-01-13 DIAGNOSIS — Z1211 Encounter for screening for malignant neoplasm of colon: Secondary | ICD-10-CM | POA: Diagnosis not present

## 2018-01-13 DIAGNOSIS — M1990 Unspecified osteoarthritis, unspecified site: Secondary | ICD-10-CM | POA: Diagnosis not present

## 2018-01-13 DIAGNOSIS — I10 Essential (primary) hypertension: Secondary | ICD-10-CM | POA: Insufficient documentation

## 2018-01-13 HISTORY — PX: COLONOSCOPY WITH PROPOFOL: SHX5780

## 2018-01-13 SURGERY — COLONOSCOPY WITH PROPOFOL
Anesthesia: General

## 2018-01-13 MED ORDER — LIDOCAINE HCL (PF) 1 % IJ SOLN
INTRAMUSCULAR | Status: AC
  Filled 2018-01-13: qty 2

## 2018-01-13 MED ORDER — LIDOCAINE HCL (CARDIAC) PF 100 MG/5ML IV SOSY
PREFILLED_SYRINGE | INTRAVENOUS | Status: DC | PRN
  Administered 2018-01-13: 50 mg via INTRAVENOUS

## 2018-01-13 MED ORDER — SODIUM CHLORIDE 0.9 % IV SOLN
INTRAVENOUS | Status: DC
  Administered 2018-01-13: 12:00:00 via INTRAVENOUS

## 2018-01-13 MED ORDER — PROPOFOL 500 MG/50ML IV EMUL
INTRAVENOUS | Status: DC | PRN
  Administered 2018-01-13: 200 ug/kg/min via INTRAVENOUS

## 2018-01-13 MED ORDER — LIDOCAINE HCL (PF) 2 % IJ SOLN
INTRAMUSCULAR | Status: AC
  Filled 2018-01-13: qty 10

## 2018-01-13 MED ORDER — PROPOFOL 500 MG/50ML IV EMUL
INTRAVENOUS | Status: AC
  Filled 2018-01-13: qty 50

## 2018-01-13 MED ORDER — PROPOFOL 10 MG/ML IV BOLUS
INTRAVENOUS | Status: DC | PRN
  Administered 2018-01-13: 50 mg via INTRAVENOUS

## 2018-01-13 NOTE — H&P (Signed)
Mary Darby, MD [address]  [place]  [place], [place] [address]  Main: [phone]  Fax: [phone] Pager: [phone]  Primary Care Physician:  Arnetha Courser, MD Primary Gastroenterologist:  Dr. Cephas [name]  Pre-Procedure History & Physical: HPI:  Mary [name] is a 67 y.o. female is here for an colonoscopy.   Past Medical History:  Diagnosis Date  . Arthritis   . Hyperlipidemia   . Hypertension   . Hyperuricemia   . Osteoporosis   . PAD (peripheral artery disease) ([place]) 02/2010   angiplasty, popliteal artery  . Tobacco abuse     Past Surgical History:  Procedure Laterality Date  . ANGIOPLASTY  July 2011   PAD- popliteal artery  . CORONARY ANGIOPLASTY    . JOINT REPLACEMENT    . Leg Bypass Surgery Right 08/2010  . OVARIAN CYST SURGERY  1980s  . POPLITEAL ARTERY STENT Right 05/2010  . POPLITEAL ARTERY STENT Right 07/2010  . TOTAL HIP ARTHROPLASTY Left 10/27/2017   Procedure: TOTAL HIP ARTHROPLASTY;  Surgeon: Earnestine Leys, MD;  Location: [facility] ORS;  Service: Orthopedics;  Laterality: Left;    Prior to Admission medications   Medication Sig Start Date End Date Taking? Authorizing Provider  acetaminophen (TYLENOL) 500 MG tablet Take 500 mg by mouth 2 (two) times daily as needed for moderate pain or headache.   Yes [provider]  amLODipine (NORVASC) 5 MG tablet Take 1 tablet (5 mg total) by mouth daily. If top number 140 or higher 12/03/17  Yes Lada, Satira Anis, MD  atorvastatin (LIPITOR) 10 MG tablet TAKE 1 TABLET BY MOUTH ONCE DAILY 10/11/17  Yes Dew, Erskine Squibb, MD  Blood Pressure Monitoring (BLOOD PRESSURE KIT) DEVI 1 each by Does not apply route 2 (two) times daily. Fluctuating blood pressures, DX I10 11/05/17  Yes Lada, Satira Anis, MD  ferrous sulfate 325 (65 FE) MG tablet Take 325 mg by mouth daily.   Yes [provider]  gabapentin (NEURONTIN) 300 MG capsule TAKE ONE CAPSULE BY MOUTH TWICE DAILY Patient taking differently: Take 300 mgs  by mouth once daily 03/05/17  Yes Stegmayer, Janalyn Harder, PA-C  HYDROcodone-acetaminophen (NORCO) 5-325 MG tablet Take 1 tablet by mouth every 6 (six) hours as needed. 10/31/17  Yes Earnestine Leys, MD  vitamin E 400 UNIT capsule Take 400 Units by mouth daily.   Yes [provider]  warfarin (COUMADIN) 5 MG tablet TAKE AS DIRECTED 11/05/17  Yes Schnier, Dolores Lory, MD    Allergies as of 01/11/2018  . (No Known Allergies)    Family History  Problem Relation Age of Onset  . Arthritis Mother   . Diabetes Mother   . Stroke Mother   . Hypertension Mother   . Cancer Father   . Arthritis Sister   . Seizures Sister   . Diabetes Sister   . Hypertension Sister   . COPD Sister   . Cancer Brother   . Stroke Maternal Grandmother   . Diabetes Paternal Grandmother   . Cancer Sister   . Diabetes Sister   . Diabetes Brother   . Diabetes Brother   . Heart disease Neg Hx   . Breast cancer Neg Hx     Social History   Socioeconomic History  . Marital status: Married    Spouse name: Not on file  . Number of children: Not on file  . Years of education: Not on file  . Highest education level: Not on file  Occupational History  .  Not on file  Social Needs  . Financial resource strain: Not on file  . Food insecurity:    Worry: Not on file    Inability: Not on file  . Transportation needs:    Medical: Not on file    Non-medical: Not on file  Tobacco Use  . Smoking status: Current Every Day Smoker    Packs/day: 0.50    Types: Cigarettes  . Smokeless tobacco: Never Used  Substance and Sexual Activity  . Alcohol use: No  . Drug use: No  . Sexual activity: Not Currently  Lifestyle  . Physical activity:    Days per week: Not on file    Minutes per session: Not on file  . Stress: Not on file  Relationships  . Social connections:    Talks on phone: Not on file    Gets together: Not on file    Attends religious service: Not on file    Active member of club or organization: Not on  file    Attends meetings of clubs or organizations: Not on file    Relationship status: Not on file  . Intimate partner violence:    Fear of current or ex partner: Not on file    Emotionally abused: Not on file    Physically abused: Not on file    Forced sexual activity: Not on file  Other Topics Concern  . Not on file  Social History Narrative  . Not on file    Review of Systems: See HPI, otherwise negative ROS  Physical Exam: BP (!) 131/92   Pulse 94   Temp (!) 97.1 F (36.2 C) (Tympanic)   Resp 18   Ht '5\' 4"'  (1.626 m)   Wt 124 lb (56.2 kg)   SpO2 99%   BMI 21.28 kg/m  General:   Alert,  pleasant and cooperative in NAD Head:  Normocephalic and atraumatic. Neck:  Supple; no masses or thyromegaly. Lungs:  Clear throughout to auscultation.    Heart:  Regular rate and rhythm. Abdomen:  Soft, nontender and nondistended. Normal bowel sounds, without guarding, and without rebound.   Neurologic:  Alert and  oriented x4;  grossly normal neurologically.  Impression/Plan: SUMIYE [name] is here for an colonoscopy to be performed for colon cancer screening  Risks, benefits, limitations, and alternatives regarding  colonoscopy have been reviewed with the patient.  Questions have been answered.  All parties agreeable.   Sherri Sear, MD  01/13/2018, 11:36 AM

## 2018-01-13 NOTE — Op Note (Signed)
[facility] Gastroenterology Patient Name: Mary [name] Procedure Date: 01/13/2018 12:08 PM MRN: [id number] Account #: [id number] Date of Birth: 1951-01-26 Admit Type: Outpatient Age: 67 Room: [facility] ENDO ROOM 4 Gender: Female Note Status: Finalized Procedure:            Colonoscopy Indications:          Screening for colorectal malignant neoplasm, Last                        colonoscopy: June 2005 Providers:            Lin Landsman MD, MD Referring MD:         Arnetha Courser (Referring MD) Medicines:            Monitored Anesthesia Care Complications:        No immediate complications. Estimated blood loss: None. Procedure:            Pre-Anesthesia Assessment:                       - Prior to the procedure, a History and Physical was                        performed, and patient medications and allergies were                        reviewed. The patient is competent. The risks and                        benefits of the procedure and the sedation options and                        risks were discussed with the patient. All questions                        were answered and informed consent was obtained.                        Patient identification and proposed procedure were                        verified by the physician, the nurse, the                        anesthesiologist, the anesthetist and the technician in                        the pre-procedure area in the procedure room in the                        endoscopy suite. Mental Status Examination: alert and                        oriented. Airway Examination: normal oropharyngeal                        airway and neck mobility. Respiratory Examination:                        clear to auscultation. CV Examination: normal.  Prophylactic Antibiotics: The patient does not require                        prophylactic antibiotics. Prior Anticoagulants: The                        patient  has taken Coumadin (warfarin), last dose was 6                        days prior to procedure. ASA Grade Assessment: III - A                        patient with severe systemic disease. After reviewing                        the risks and benefits, the patient was deemed in                        satisfactory condition to undergo the procedure. The                        anesthesia plan was to use monitored anesthesia care                        (MAC). Immediately prior to administration of                        medications, the patient was re-assessed for adequacy                        to receive sedatives. The heart rate, respiratory rate,                        oxygen saturations, blood pressure, adequacy of                        pulmonary ventilation, and response to care were                        monitored throughout the procedure. The physical status                        of the patient was re-assessed after the procedure.                       After obtaining informed consent, the colonoscope was                        passed under direct vision. Throughout the procedure,                        the patient's blood pressure, pulse, and oxygen                        saturations were monitored continuously. The                        Colonoscope was introduced through the anus and  advanced to the the cecum, identified by appendiceal                        orifice and ileocecal valve. The colonoscopy was                        performed with moderate difficulty due to poor bowel                        prep with stool present and a tortuous colon.                        Successful completion of the procedure was aided by                        changing the patient to a supine position and applying                        abdominal pressure. The patient tolerated the procedure                        well. The quality of the bowel preparation was  poor. Findings:      The perianal and digital rectal examinations were normal. Pertinent       negatives include normal sphincter tone and no palpable rectal lesions.      Copious quantities of semi-solid stool was found in the entire colon,       making visualization difficult.      The retroflexed view of the distal rectum and anal verge was normal and       showed no anal or rectal abnormalities. Impression:           - Preparation of the colon was poor.                       - Stool in the entire examined colon.                       - The distal rectum and anal verge are normal on                        retroflexion view.                       - No specimens collected. Recommendation:       - Discharge patient to home.                       - Clear liquid diet.                       - Continue present medications.                       - Repeat colonoscopy tomorrow because the bowel                        preparation was poor. Procedure Code(s):    --- Professional ---                       Q8250, Colorectal cancer screening; colonoscopy on  individual not meeting criteria for high risk Diagnosis Code(s):    --- Professional ---                       Z12.11, Encounter for screening for malignant neoplasm                        of colon CPT copyright 2017 American Medical Association. All rights reserved. The codes documented in this report are preliminary and upon coder review may  be revised to meet current compliance requirements. Dr. Ulyess Mort Lin Landsman MD, MD 01/13/2018 12:46:50 PM This report has been signed electronically. Number of Addenda: 0 Note Initiated On: 01/13/2018 12:08 PM Scope Withdrawal Time: 0 hours 1 minute 57 seconds  Total Procedure Duration: 0 hours 16 minutes 8 seconds       [facility]

## 2018-01-13 NOTE — Anesthesia Post-op Follow-up Note (Signed)
Anesthesia QCDR form completed.        

## 2018-01-13 NOTE — Anesthesia Preprocedure Evaluation (Signed)
Anesthesia Evaluation  Patient identified by MRN, date of birth, ID band Patient awake    Reviewed: Allergy & Precautions, H&P , NPO status , Patient's Chart, lab work & pertinent test results  History of Anesthesia Complications Negative for: history of anesthetic complications  Airway Mallampati: III  TM Distance: >3 FB Neck ROM: limited    Dental  (+) Chipped, Poor Dentition, Missing, Loose   Pulmonary neg pulmonary ROS, COPD, Current Smoker,           Cardiovascular Exercise Tolerance: Good hypertension, (-) angina+ Peripheral Vascular Disease  (-) Past MI and (-) DOE      Neuro/Psych negative neurological ROS  negative psych ROS   GI/Hepatic negative GI ROS, Neg liver ROS, neg GERD  ,  Endo/Other  negative endocrine ROS  Renal/GU negative Renal ROS  negative genitourinary   Musculoskeletal  (+) Arthritis ,   Abdominal   Peds  Hematology negative hematology ROS (+)   Anesthesia Other Findings Past Medical History: No date: Arthritis No date: Hyperlipidemia No date: Hypertension No date: Hyperuricemia No date: Osteoporosis 02/2010: PAD (peripheral artery disease) (HCC)     Comment:  angiplasty, popliteal artery No date: Tobacco abuse  Past Surgical History: July 2011: ANGIOPLASTY     Comment:  PAD- popliteal artery No date: CORONARY ANGIOPLASTY No date: JOINT REPLACEMENT 08/2010: Leg Bypass Surgery; Right 1980s: OVARIAN CYST SURGERY 05/2010: POPLITEAL ARTERY STENT; Right 07/2010: POPLITEAL ARTERY STENT; Right 10/27/2017: TOTAL HIP ARTHROPLASTY; Left     Comment:  Procedure: TOTAL HIP ARTHROPLASTY;  Surgeon: Earnestine Leys, MD;  Location: [facility] ORS;  Service: Orthopedics;                Laterality: Left;  BMI    Body Mass Index:  21.28 kg/m      Reproductive/Obstetrics negative OB ROS                             Anesthesia Physical Anesthesia Plan  ASA:  III  Anesthesia Plan: General   Post-op Pain Management:    Induction: Intravenous  PONV Risk Score and Plan: Propofol infusion and TIVA  Airway Management Planned: Natural Airway and Nasal Cannula  Additional Equipment:   Intra-op Plan:   Post-operative Plan:   Informed Consent: I have reviewed the patients History and Physical, chart, labs and discussed the procedure including the risks, benefits and alternatives for the proposed anesthesia with the patient or authorized representative who has indicated his/her understanding and acceptance.   Dental Advisory Given  Plan Discussed with: Anesthesiologist, CRNA and Surgeon  Anesthesia Plan Comments: (Patient consented for risks of anesthesia including but not limited to:  - adverse reactions to medications - risk of intubation if required - damage to teeth, lips or other oral mucosa - sore throat or hoarseness - Damage to heart, brain, lungs or loss of life  Patient voiced understanding.)        Anesthesia Quick Evaluation

## 2018-01-13 NOTE — Transfer of Care (Signed)
Immediate Anesthesia Transfer of Care Note  Patient: Mary [name]  Procedure(s) Performed: COLONOSCOPY WITH PROPOFOL (N/A )  Patient Location: PACU and Endoscopy Unit  Anesthesia Type:General  Level of Consciousness: sedated  Airway & Oxygen Therapy: Patient Spontanous Breathing  Post-op Assessment: Report given to RN  Post vital signs: stable  Last Vitals:  Vitals Value Taken Time  BP 103/65 01/13/2018 12:47 PM  Temp    Pulse 78 01/13/2018 12:48 PM  Resp 22 01/13/2018 12:48 PM  SpO2 100 % 01/13/2018 12:48 PM  Vitals shown include unvalidated device data.  Last Pain:  Vitals:   01/13/18 1032  TempSrc: Tympanic  PainSc: 0-No pain         Complications: No apparent anesthesia complications

## 2018-01-14 ENCOUNTER — Encounter: Payer: Self-pay | Admitting: *Deleted

## 2018-01-14 ENCOUNTER — Other Ambulatory Visit: Payer: Self-pay

## 2018-01-14 ENCOUNTER — Ambulatory Visit: Payer: Medicare HMO | Admitting: Anesthesiology

## 2018-01-14 ENCOUNTER — Encounter
Admission: RE | Disposition: A | Discharge status: Home / Self Care | Payer: Self-pay | Source: Ambulatory Visit | Attending: Gastroenterology

## 2018-01-14 ENCOUNTER — Ambulatory Visit
Admission: RE | Admit: 2018-01-14 | Discharge: 2018-01-14 | Disposition: A | Discharge status: Home / Self Care | Payer: Medicare HMO | Source: Ambulatory Visit | Attending: Gastroenterology | Admitting: Gastroenterology

## 2018-01-14 DIAGNOSIS — M810 Age-related osteoporosis without current pathological fracture: Secondary | ICD-10-CM | POA: Diagnosis not present

## 2018-01-14 DIAGNOSIS — D125 Benign neoplasm of sigmoid colon: Secondary | ICD-10-CM | POA: Diagnosis not present

## 2018-01-14 DIAGNOSIS — K635 Polyp of colon: Secondary | ICD-10-CM | POA: Diagnosis not present

## 2018-01-14 DIAGNOSIS — E785 Hyperlipidemia, unspecified: Secondary | ICD-10-CM | POA: Insufficient documentation

## 2018-01-14 DIAGNOSIS — Z79899 Other long term (current) drug therapy: Secondary | ICD-10-CM | POA: Insufficient documentation

## 2018-01-14 DIAGNOSIS — I739 Peripheral vascular disease, unspecified: Secondary | ICD-10-CM | POA: Insufficient documentation

## 2018-01-14 DIAGNOSIS — F17210 Nicotine dependence, cigarettes, uncomplicated: Secondary | ICD-10-CM | POA: Insufficient documentation

## 2018-01-14 DIAGNOSIS — J449 Chronic obstructive pulmonary disease, unspecified: Secondary | ICD-10-CM | POA: Insufficient documentation

## 2018-01-14 DIAGNOSIS — K644 Residual hemorrhoidal skin tags: Secondary | ICD-10-CM | POA: Diagnosis not present

## 2018-01-14 DIAGNOSIS — Z96642 Presence of left artificial hip joint: Secondary | ICD-10-CM | POA: Diagnosis not present

## 2018-01-14 DIAGNOSIS — K639 Disease of intestine, unspecified: Secondary | ICD-10-CM | POA: Diagnosis not present

## 2018-01-14 DIAGNOSIS — K6389 Other specified diseases of intestine: Secondary | ICD-10-CM | POA: Diagnosis not present

## 2018-01-14 DIAGNOSIS — Z7901 Long term (current) use of anticoagulants: Secondary | ICD-10-CM | POA: Diagnosis not present

## 2018-01-14 DIAGNOSIS — Z9861 Coronary angioplasty status: Secondary | ICD-10-CM | POA: Insufficient documentation

## 2018-01-14 DIAGNOSIS — I10 Essential (primary) hypertension: Secondary | ICD-10-CM | POA: Insufficient documentation

## 2018-01-14 DIAGNOSIS — K6289 Other specified diseases of anus and rectum: Secondary | ICD-10-CM | POA: Diagnosis not present

## 2018-01-14 DIAGNOSIS — D123 Benign neoplasm of transverse colon: Secondary | ICD-10-CM | POA: Insufficient documentation

## 2018-01-14 DIAGNOSIS — Z1211 Encounter for screening for malignant neoplasm of colon: Secondary | ICD-10-CM

## 2018-01-14 DIAGNOSIS — D126 Benign neoplasm of colon, unspecified: Secondary | ICD-10-CM | POA: Diagnosis not present

## 2018-01-14 HISTORY — PX: COLONOSCOPY WITH PROPOFOL: SHX5780

## 2018-01-14 SURGERY — COLONOSCOPY WITH PROPOFOL
Anesthesia: General

## 2018-01-14 MED ORDER — PROPOFOL 500 MG/50ML IV EMUL
INTRAVENOUS | Status: AC
  Filled 2018-01-14: qty 50

## 2018-01-14 MED ORDER — SODIUM CHLORIDE 0.9 % IV SOLN
INTRAVENOUS | Status: DC
  Administered 2018-01-14: 11:00:00 via INTRAVENOUS

## 2018-01-14 MED ORDER — PROPOFOL 500 MG/50ML IV EMUL
INTRAVENOUS | Status: DC | PRN
  Administered 2018-01-14: 150 ug/kg/min via INTRAVENOUS

## 2018-01-14 MED ORDER — LIDOCAINE HCL (PF) 1 % IJ SOLN
INTRAMUSCULAR | Status: AC
  Filled 2018-01-14: qty 2

## 2018-01-14 MED ORDER — PROPOFOL 10 MG/ML IV BOLUS
INTRAVENOUS | Status: DC | PRN
  Administered 2018-01-14: 60 mg via INTRAVENOUS

## 2018-01-14 NOTE — H&P (Signed)
Varnita Tahiliani, MD [address], [address], [place], [place], [address] [address], [address], [place], [place], [address] Phone: [phone]  Fax: [phone]  Primary Care Physician:  Lada, Melinda P, MD   Pre-Procedure History & Physical: HPI:  Mary [name] is a 66 y.o. female is here for a colonoscopy.   Past Medical History:  Diagnosis Date  . Arthritis   . Hyperlipidemia   . Hypertension   . Hyperuricemia   . Osteoporosis   . PAD (peripheral artery disease) (HCC) 02/2010   angiplasty, popliteal artery  . Tobacco abuse     Past Surgical History:  Procedure Laterality Date  . ANGIOPLASTY  July 2011   PAD- popliteal artery  . CORONARY ANGIOPLASTY    . JOINT REPLACEMENT    . Leg Bypass Surgery Right 08/2010  . OVARIAN CYST SURGERY  1980s  . POPLITEAL ARTERY STENT Right 05/2010  . POPLITEAL ARTERY STENT Right 07/2010  . TOTAL HIP ARTHROPLASTY Left 10/27/2017   Procedure: TOTAL HIP ARTHROPLASTY;  Surgeon: Miller, Howard, MD;  Location: [facility] ORS;  Service: Orthopedics;  Laterality: Left;    Prior to Admission medications   Medication Sig Start Date End Date Taking? Authorizing Provider  acetaminophen (TYLENOL) 500 MG tablet Take 500 mg by mouth 2 (two) times daily as needed for moderate pain or headache.   Yes [provider]  amLODipine (NORVASC) 5 MG tablet Take 1 tablet (5 mg total) by mouth daily. If top number 140 or higher 12/03/17  Yes Lada, Melinda P, MD  atorvastatin (LIPITOR) 10 MG tablet TAKE 1 TABLET BY MOUTH ONCE DAILY 10/11/17  Yes Dew, Jason S, MD  Blood Pressure Monitoring (BLOOD PRESSURE KIT) DEVI 1 each by Does not apply route 2 (two) times daily. Fluctuating blood pressures, DX I10 11/05/17  Yes Lada, Melinda P, MD  ferrous sulfate 325 (65 FE) MG tablet Take 325 mg by mouth daily.   Yes [provider]  gabapentin (NEURONTIN) 300 MG capsule TAKE ONE CAPSULE BY MOUTH TWICE DAILY Patient taking differently: Take 300 mgs by mouth once  daily 03/05/17  Yes Stegmayer, Kimberly A, PA-C  vitamin E 400 UNIT capsule Take 400 Units by mouth daily.   Yes [provider]  warfarin (COUMADIN) 5 MG tablet TAKE AS DIRECTED 11/05/17  Yes Schnier, Gregory G, MD  HYDROcodone-acetaminophen (NORCO) 5-325 MG tablet Take 1 tablet by mouth every 6 (six) hours as needed. Patient not taking: Reported on 01/14/2018 10/31/17   Miller, Howard, MD    Allergies as of 01/13/2018  . (No Known Allergies)    Family History  Problem Relation Age of Onset  . Arthritis Mother   . Diabetes Mother   . Stroke Mother   . Hypertension Mother   . Cancer Father   . Arthritis Sister   . Seizures Sister   . Diabetes Sister   . Hypertension Sister   . COPD Sister   . Cancer Brother   . Stroke Maternal Grandmother   . Diabetes Paternal Grandmother   . Cancer Sister   . Diabetes Sister   . Diabetes Brother   . Diabetes Brother   . Heart disease Neg Hx   . Breast cancer Neg Hx     Social History   Socioeconomic History  . Marital status: Married    Spouse name: Not on file  . Number of children: Not on file  . Years of education: Not on file  . Highest education level: Not on file    Occupational History  . Not on file  Social Needs  . Financial resource strain: Not on file  . Food insecurity:    Worry: Not on file    Inability: Not on file  . Transportation needs:    Medical: Not on file    Non-medical: Not on file  Tobacco Use  . Smoking status: Current Every Day Smoker    Packs/day: 0.50    Types: Cigarettes  . Smokeless tobacco: Never Used  Substance and Sexual Activity  . Alcohol use: No  . Drug use: No  . Sexual activity: Not Currently  Lifestyle  . Physical activity:    Days per week: Not on file    Minutes per session: Not on file  . Stress: Not on file  Relationships  . Social connections:    Talks on phone: Not on file    Gets together: Not on file    Attends religious service: Not on file    Active member of club  or organization: Not on file    Attends meetings of clubs or organizations: Not on file    Relationship status: Not on file  . Intimate partner violence:    Fear of current or ex partner: Not on file    Emotionally abused: Not on file    Physically abused: Not on file    Forced sexual activity: Not on file  Other Topics Concern  . Not on file  Social History Narrative  . Not on file    Review of Systems: See HPI, otherwise negative ROS  Physical Exam: BP (!) 152/85   Pulse 78   Temp (!) 97.3 F (36.3 C) (Tympanic)   Resp 19   Ht 5' 4" (1.626 m)   Wt 124 lb (56.2 kg)   SpO2 100%   BMI 21.28 kg/m  General:   Alert,  pleasant and cooperative in NAD Head:  Normocephalic and atraumatic. Neck:  Supple; no masses or thyromegaly. Lungs:  Clear throughout to auscultation, normal respiratory effort.    Heart:  +S1, +S2, Regular rate and rhythm, No edema. Abdomen:  Soft, nontender and nondistended. Normal bowel sounds, without guarding, and without rebound.   Neurologic:  Alert and  oriented x4;  grossly normal neurologically.  Impression/Plan: Mary [name] is here for a colonoscopy to be performed for average risk screening.  Risks, benefits, limitations, and alternatives regarding  colonoscopy have been reviewed with the patient.  Questions have been answered.  All parties agreeable.   Varnita B Tahiliani, MD  01/14/2018, 12:21 PM   

## 2018-01-14 NOTE — Anesthesia Preprocedure Evaluation (Signed)
Anesthesia Evaluation  Patient identified by MRN, date of birth, ID band Patient awake    Reviewed: Allergy & Precautions, H&P , NPO status , Patient's Chart, lab work & pertinent test results  History of Anesthesia Complications Negative for: history of anesthetic complications  Airway Mallampati: III  TM Distance: >3 FB Neck ROM: limited    Dental  (+) Chipped, Poor Dentition, Missing, Loose   Pulmonary neg pulmonary ROS, COPD, Current Smoker,           Cardiovascular Exercise Tolerance: Good hypertension, (-) angina+ Peripheral Vascular Disease  (-) Past MI and (-) DOE      Neuro/Psych negative neurological ROS  negative psych ROS   GI/Hepatic negative GI ROS, Neg liver ROS, neg GERD  ,  Endo/Other  negative endocrine ROS  Renal/GU negative Renal ROS  negative genitourinary   Musculoskeletal  (+) Arthritis ,   Abdominal   Peds  Hematology negative hematology ROS (+)   Anesthesia Other Findings Past Medical History: No date: Arthritis No date: Hyperlipidemia No date: Hypertension No date: Hyperuricemia No date: Osteoporosis 02/2010: PAD (peripheral artery disease) (HCC)     Comment:  angiplasty, popliteal artery No date: Tobacco abuse  Past Surgical History: July 2011: ANGIOPLASTY     Comment:  PAD- popliteal artery No date: CORONARY ANGIOPLASTY No date: JOINT REPLACEMENT 08/2010: Leg Bypass Surgery; Right 1980s: OVARIAN CYST SURGERY 05/2010: POPLITEAL ARTERY STENT; Right 07/2010: POPLITEAL ARTERY STENT; Right 10/27/2017: TOTAL HIP ARTHROPLASTY; Left     Comment:  Procedure: TOTAL HIP ARTHROPLASTY;  Surgeon: Earnestine Leys, MD;  Location: [facility] ORS;  Service: Orthopedics;                Laterality: Left;  BMI    Body Mass Index:  21.28 kg/m      Reproductive/Obstetrics negative OB ROS                             Anesthesia Physical  Anesthesia Plan  ASA:  III  Anesthesia Plan: General   Post-op Pain Management:    Induction: Intravenous  PONV Risk Score and Plan: Propofol infusion and TIVA  Airway Management Planned: Natural Airway and Nasal Cannula  Additional Equipment:   Intra-op Plan:   Post-operative Plan:   Informed Consent: I have reviewed the patients History and Physical, chart, labs and discussed the procedure including the risks, benefits and alternatives for the proposed anesthesia with the patient or authorized representative who has indicated his/her understanding and acceptance.   Dental Advisory Given  Plan Discussed with: Anesthesiologist, CRNA and Surgeon  Anesthesia Plan Comments: (Patient consented for risks of anesthesia including but not limited to:  - adverse reactions to medications - risk of intubation if required - damage to teeth, lips or other oral mucosa - sore throat or hoarseness - Damage to heart, brain, lungs or loss of life  Patient voiced understanding.)        Anesthesia Quick Evaluation

## 2018-01-14 NOTE — Op Note (Signed)
[facility] Patient Name: Mary [name] Procedure Date: 01/14/2018 12:32 PM MRN: [id number] Account #: [id number] Date of Birth: 1950-11-17 Admit Type: Outpatient Age: 67 Room: [facility] ENDO ROOM 2 Gender: Female Note Status: Finalized Procedure:            Colonoscopy Indications:          Screening for colorectal malignant neoplasm Providers:            Appolonia Ackert B. Bonna Gains MD, MD Medicines:            Monitored Anesthesia Care Complications:        No immediate complications. Procedure:            Pre-Anesthesia Assessment:                       - ASA Grade Assessment: III - A patient with severe                        systemic disease.                       - Prior to the procedure, a History and Physical was                        performed, and patient medications, allergies and                        sensitivities were reviewed. The patient's tolerance of                        previous anesthesia was reviewed.                       - The risks and benefits of the procedure and the                        sedation options and risks were discussed with the                        patient. All questions were answered and informed                        consent was obtained.                       - Patient identification and proposed procedure were                        verified prior to the procedure by the physician, the                        nurse, the anesthesiologist, the anesthetist and the                        technician. The procedure was verified in the procedure                        room.                       After obtaining informed consent, the colonoscope was  passed under direct vision. Throughout the procedure,                        the patient's blood pressure, pulse, and oxygen                        saturations were monitored continuously. The                        Colonoscope was introduced through the  anus and                        advanced to the the cecum, identified by appendiceal                        orifice and ileocecal valve. The colonoscopy was                        performed with ease. The patient tolerated the                        procedure well. The quality of the bowel preparation                        was good except the ascending colon was fair. Findings:      The perianal exam findings include non-thrombosed external hemorrhoids.      One mucosal nodule was found at the hepatic flexure. Biopsies were taken       with a cold forceps for histology.      A 3 mm polyp was found in the sigmoid colon. The polyp was sessile. The       polyp was removed with a cold biopsy forceps. Resection and retrieval       were complete.      A 6 mm polyp was found in the sigmoid colon. The polyp was sessile. The       polyp was removed with a cold snare. Resection and retrieval were       complete.      A patchy area of mildly erythematous mucosa was found in the rectum.       Biopsies were taken with a cold forceps for histology.      The exam was otherwise without abnormality.      The rectum, sigmoid colon, descending colon, transverse colon, ascending       colon and cecum appeared normal.      The retroflexed view of the distal rectum and anal verge was normal and       showed no anal or rectal abnormalities. Impression:           - Non-thrombosed external hemorrhoids found on perianal                        exam.                       - Mucosal nodule at the hepatic flexure. Biopsied.                       - One 3 mm polyp in the sigmoid colon, removed with a  cold biopsy forceps. Resected and retrieved.                       - One 6 mm polyp in the sigmoid colon, removed with a                        cold snare. Resected and retrieved.                       - Erythematous mucosa in the rectum. Biopsied.                       - The examination was  otherwise normal.                       - The rectum, sigmoid colon, descending colon,                        transverse colon, ascending colon and cecum are normal.                       - The distal rectum and anal verge are normal on                        retroflexion view. Recommendation:       - Discharge patient to home (with escort).                       - Advance diet as tolerated.                       - Continue present medications.                       - Await pathology results.                       - Repeat colonoscopy in 1 year for screening purposes                        with 2 day prep. (Patient had a colonoscopy yesterday                        with poor prep, Colonoscopy today was fair prep in the                        ascending colon and cecum)                       - The findings and recommendations were discussed with                        the patient.                       - The findings and recommendations were discussed with                        the patient's family.                       - Return to primary care physician as previously  scheduled.                       - High fiber diet. Procedure Code(s):    --- Professional ---                       [phone], Colonoscopy, flexible; with removal of tumor(s),                        polyp(s), or other lesion(s) by snare technique                       45380, 50, Colonoscopy, flexible; with biopsy, single                        or multiple Diagnosis Code(s):    --- Professional ---                       Z12.11, Encounter for screening for malignant neoplasm                        of colon                       K63.89, Other specified diseases of intestine                       D12.5, Benign neoplasm of sigmoid colon                       K62.89, Other specified diseases of anus and rectum                       K64.4, Residual hemorrhoidal skin tags CPT copyright 2017 American Medical  Association. All rights reserved. The codes documented in this report are preliminary and upon coder review may  be revised to meet current compliance requirements.  Vonda Antigua, MD Margretta Sidle B. Bonna Gains MD, MD 01/14/2018 1:28:49 PM This report has been signed electronically. Number of Addenda: 0 Note Initiated On: 01/14/2018 12:32 PM Scope Withdrawal Time: 0 hours 31 minutes 30 seconds  Total Procedure Duration: 0 hours 40 minutes 25 seconds  Estimated Blood Loss: Estimated blood loss: none.      [facility]

## 2018-01-14 NOTE — Anesthesia Post-op Follow-up Note (Signed)
Anesthesia QCDR form completed.        

## 2018-01-14 NOTE — Anesthesia Procedure Notes (Signed)
Performed by: Keona Bilyeu, CRNA Pre-anesthesia Checklist: Patient identified, Emergency Drugs available, Suction available and Patient being monitored Patient Re-evaluated:Patient Re-evaluated prior to induction Oxygen Delivery Method: Nasal cannula Induction Type: IV induction Dental Injury: Teeth and Oropharynx as per pre-operative assessment  Comments: Nasal cannula with etCO2 monitoring       

## 2018-01-14 NOTE — Transfer of Care (Signed)
Immediate Anesthesia Transfer of Care Note  Patient: Mary [name]  Procedure(s) Performed: COLONOSCOPY WITH PROPOFOL (N/A )  Patient Location: PACU  Anesthesia Type:General  Level of Consciousness: sedated  Airway & Oxygen Therapy: Patient Spontanous Breathing and Patient connected to nasal cannula oxygen  Post-op Assessment: Report given to RN and Post -op Vital signs reviewed and stable  Post vital signs: Reviewed and stable  Last Vitals:  Vitals Value Taken Time  BP 131/76 01/14/2018  1:30 PM  Temp 35.9 C 01/14/2018  1:29 PM  Pulse 71 01/14/2018  1:32 PM  Resp 19 01/14/2018  1:32 PM  SpO2 100 % 01/14/2018  1:32 PM  Vitals shown include unvalidated device data.  Last Pain:  Vitals:   01/14/18 1329  TempSrc: Tympanic  PainSc: 0-No pain         Complications: No apparent anesthesia complications

## 2018-01-15 NOTE — Anesthesia Postprocedure Evaluation (Signed)
Anesthesia Post Note  Patient: Mary [name]  Procedure(s) Performed: COLONOSCOPY WITH PROPOFOL (N/A )  Patient location during evaluation: Endoscopy Anesthesia Type: General Level of consciousness: awake and alert Pain management: pain level controlled Vital Signs Assessment: post-procedure vital signs reviewed and stable Respiratory status: spontaneous breathing, nonlabored ventilation, respiratory function stable and patient connected to nasal cannula oxygen Cardiovascular status: blood pressure returned to baseline and stable Postop Assessment: no apparent nausea or vomiting Anesthetic complications: no     Last Vitals:  Vitals:   01/14/18 1349 01/14/18 1359  BP: (!) 143/80 (!) 167/81  Pulse: 73 71  Resp: 15 19  Temp:    SpO2: 100% 99%    Last Pain:  Vitals:   01/15/18 1108  TempSrc:   PainSc: 0-No pain                 Martha Clan

## 2018-01-17 ENCOUNTER — Encounter: Payer: Self-pay | Admitting: Gastroenterology

## 2018-01-17 NOTE — Anesthesia Postprocedure Evaluation (Signed)
Anesthesia Post Note  Patient: Mary [name]  Procedure(s) Performed: COLONOSCOPY WITH PROPOFOL (N/A )  Patient location during evaluation: PACU Anesthesia Type: General Level of consciousness: awake and alert Pain management: pain level controlled Vital Signs Assessment: post-procedure vital signs reviewed and stable Respiratory status: spontaneous breathing, nonlabored ventilation, respiratory function stable and patient connected to nasal cannula oxygen Cardiovascular status: blood pressure returned to baseline and stable Postop Assessment: no apparent nausea or vomiting Anesthetic complications: no     Last Vitals:  Vitals:   01/13/18 1310 01/13/18 1320  BP:  (!) 148/65  Pulse: 75 74  Resp: 19 19  Temp:    SpO2: 99% 100%    Last Pain:  Vitals:   01/14/18 0737  TempSrc:   PainSc: 0-No pain                 Molli Barrows

## 2018-01-18 ENCOUNTER — Telehealth: Payer: Self-pay | Admitting: Gastroenterology

## 2018-01-18 LAB — SURGICAL PATHOLOGY

## 2018-01-18 NOTE — Telephone Encounter (Signed)
LEFT VM FOR PT TO CALL OFFICE AND SCHEDULE 2-4 WEEK FU WITH DR. TAHILIANI

## 2018-01-19 ENCOUNTER — Telehealth (INDEPENDENT_AMBULATORY_CARE_PROVIDER_SITE_OTHER): Payer: Self-pay

## 2018-01-19 NOTE — Telephone Encounter (Signed)
Patient called stating that we should be on the look out for a form from [facility], for her Gabapentin to be transferred to them for processing and payment since it will be no charge.  FYI- Her [employer] # is [id number]

## 2018-01-27 ENCOUNTER — Telehealth (INDEPENDENT_AMBULATORY_CARE_PROVIDER_SITE_OTHER): Payer: Self-pay

## 2018-01-27 NOTE — Telephone Encounter (Signed)
That medication was originally filled on March 05, 2017.  The patient was last seen in our office on August 26, 2017.  The medication is controlled therefore I can not re-new it without seeing the patient. She can also ask her primary care physician who sees her on a regular basis to continue it.  If the patient would like to be seen in our office, please schedule her for an office visit with an ABI.

## 2018-01-27 NOTE — Telephone Encounter (Signed)
Called the patient back to let her know that per Mary [name], she would have to be seen here in the office by the doctor or herself in order for her controlled medication to be considered for a refill. The patient has not been seen here in our office since late last year, and she would need to be seen for an ABI and to see the provider and then they can make the determination if pain medication is needed at that time. Or the patient can contact her primary care provider to see if they will refill it for her. She stated that she will call the office back later this year to make a follow up

## 2018-01-27 NOTE — Telephone Encounter (Signed)
Patient called to see if we could fax over a new order for her Gabapentin to [place] at [phone].  The patient was last seen in July of 2018. Should she be seen first? Is this a medication that you would renew normally?

## 2018-02-01 ENCOUNTER — Ambulatory Visit: Payer: Medicare HMO | Admitting: Gastroenterology

## 2018-02-01 DIAGNOSIS — Z86010 Personal history of colonic polyps: Secondary | ICD-10-CM

## 2018-02-01 NOTE — Progress Notes (Signed)
Vonda Antigua, MD [address]  [place], [place] [address]  Main: [phone]  Fax: [phone]   Primary Care Physician: Arnetha Courser, MD  Primary Gastroenterologist:  Dr. Vonda Antigua  Chief Complaint  Patient presents with  . Follow-up    Colonoscopy 5/17    HPI: Mary [name] is a 67 y.o. female here for follow-up post colonoscopy.  Patient had a screening colonoscopy on 5/16 by Dr. Marius Ditch (complete to the cecum), procedure was rescheduled for 5/17 due to poor prep on 5/16.  See detailed procedure report and findings.  In brief, hepatic flexure lesion was seen and biopsied, as it did not appear convincing for polyp.  This shows tubular adenoma needs to be removed. patient denies any abdominal pain, blood in stool, altered bowel habits, family history of colon cancer.  5/17 Impression:           - Non-thrombosed external hemorrhoids found on perianal                        exam.                       - Mucosal nodule at the hepatic flexure. Biopsied.                       - One 3 mm polyp in the sigmoid colon, removed with a                        cold biopsy forceps. Resected and retrieved.                       - One 6 mm polyp in the sigmoid colon, removed with a                        cold snare. Resected and retrieved.                       - Erythematous mucosa in the rectum. Biopsied.                       - The examination was otherwise normal.                       - The rectum, sigmoid colon, descending colon,                        transverse colon, ascending colon and cecum are normal.                       - The distal rectum and anal verge are normal on                        retroflexion view.  5/16 Impression:           - Preparation of the colon was poor.                       - Stool in the entire examined colon.                       - The distal rectum and anal verge are normal on  retroflexion view.                      - No specimens collected.    DIAGNOSIS:  A. COLON LESION, HEPATIC FLEXURE; COLD BIOPSY:  - EARLY TUBULAR ADENOMA.  - NEGATIVE FOR HIGH-GRADE DYSPLASIA AND MALIGNANCY.   B. COLON POLYP X2, SIGMOID; COLD SNARE AND COLD BIOPSY:  - SESSILE SERRATED ADENOMA (TWO FRAGMENTS).  - TANGENTIAL SECTION OF SERRATED POLYP, HYPERPLASTIC POLYP VERSUS EARLY  SESSILE SERRATED ADENOMA (ONE FRAGMENT).  - NEGATIVE FOR HIGH-GRADE DYSPLASIA AND MALIGNANCY.   C. RECTUM; COLD BIOPSY:  - COLONIC MUCOSA WITH FOCAL MILD HYPERPLASTIC EPITHELIUM, CONSISTENT  WITH NON-SPECIFIC REACTIVE CHANGE.  - NEGATIVE FOR ISCHEMIA, DYSPLASIA, AND MALIGNANCY.          Current Outpatient Medications  Medication Sig Dispense Refill  . acetaminophen (TYLENOL) 500 MG tablet Take 500 mg by mouth 2 (two) times daily as needed for moderate pain or headache.    Marland Kitchen amLODipine (NORVASC) 5 MG tablet Take 1 tablet (5 mg total) by mouth daily. If top number 140 or higher    . atorvastatin (LIPITOR) 10 MG tablet TAKE 1 TABLET BY MOUTH ONCE DAILY 90 tablet 2  . Blood Pressure Monitoring (BLOOD PRESSURE KIT) DEVI 1 each by Does not apply route 2 (two) times daily. Fluctuating blood pressures, DX I10 1 Device 0  . ferrous sulfate 325 (65 FE) MG tablet Take 325 mg by mouth daily.    Marland Kitchen gabapentin (NEURONTIN) 300 MG capsule TAKE ONE CAPSULE BY MOUTH TWICE DAILY (Patient taking differently: Take 300 mgs by mouth once daily) 60 capsule 11  . HYDROcodone-acetaminophen (NORCO) 5-325 MG tablet Take 1 tablet by mouth every 6 (six) hours as needed. (Patient not taking: Reported on 01/14/2018) 40 tablet 0  . vitamin E 400 UNIT capsule Take 400 Units by mouth daily.    Marland Kitchen warfarin (COUMADIN) 5 MG tablet TAKE AS DIRECTED 90 tablet 2   No current facility-administered medications for this visit.     Allergies as of 02/01/2018  . (No Known Allergies)    ROS:  General: Negative for anorexia, weight loss, fever, chills, fatigue,  weakness. ENT: Negative for hoarseness, difficulty swallowing , nasal congestion. CV: Negative for chest pain, angina, palpitations, dyspnea on exertion, peripheral edema.  Respiratory: Negative for dyspnea at rest, dyspnea on exertion, cough, sputum, wheezing.  GI: See history of present illness. GU:  Negative for dysuria, hematuria, urinary incontinence, urinary frequency, nocturnal urination.  Endo: Negative for unusual weight change.    Physical Examination:   Vitals reviewed  General: Well-nourished, well-developed in no acute distress.  Eyes: No icterus. Conjunctivae pink. Mouth: Oropharyngeal mucosa moist and pink , no lesions erythema or exudate. Neck: Supple, Trachea midline Abdomen: Bowel sounds are normal, nontender, nondistended, no hepatosplenomegaly or masses, no abdominal bruits or hernia , no rebound or guarding.   Cardiac: Positive S1, positive S2, RRR, no edema Pulmonary: CTA bilaterally, normal respiratory effort Extremities: No lower extremity edema. No clubbing or deformities. Neuro: Alert and oriented x 3.  Grossly intact. Skin: Warm and dry, no jaundice.   Psych: Alert and cooperative, normal mood and affect.   Labs: CMP     Component Value Date/Time   NA 141 10/28/2017 0359   NA 142 10/17/2015 1017   NA 138 01/21/2012 0740   K 3.4 (L) 10/28/2017 0359   K 2.8 (L) 01/21/2012 0740   CL 108 10/28/2017 0359   CL 98 01/21/2012 0740  CO2 26 10/28/2017 0359   CO2 30 01/21/2012 0740   GLUCOSE 106 (H) 10/28/2017 0359   GLUCOSE 117 (H) 01/21/2012 0740   BUN 12 10/28/2017 0359   BUN 6 (L) 10/17/2015 1017   BUN 9 01/21/2012 0740   CREATININE 0.85 10/28/2017 0359   CREATININE 0.66 10/21/2017 1450   CALCIUM 8.1 (L) 10/28/2017 0359   CALCIUM 9.3 01/21/2012 0740   PROT 8.2 (H) 10/13/2017 1332   PROT 7.0 10/17/2015 1017   ALBUMIN 4.1 10/13/2017 1332   ALBUMIN 4.1 10/17/2015 1017   AST 22 10/13/2017 1332   ALT 15 10/13/2017 1332   ALKPHOS 85 10/13/2017 1332     BILITOT 0.5 10/13/2017 1332   BILITOT 0.3 10/17/2015 1017   GFRNONAA >60 10/28/2017 0359   GFRNONAA 86 09/27/2017 1451   GFRAA >60 10/28/2017 0359   GFRAA 99 09/27/2017 1451   Lab Results  Component Value Date   WBC 11.3 (H) 10/30/2017   HGB 12.0 10/30/2017   HCT 35.8 10/30/2017   MCV 89.5 10/30/2017   PLT 205 10/30/2017    Imaging Studies: No results found.  Assessment and Plan:   Mary [name] is a 67 y.o. y/o female here for follow-up with colonoscopy, done for average risk screening, with a hepatic flexure lesion showing tubular adenoma, that was just biopsied and not removed  The colonoscopy report, findings, and pictures were discussed in detail with the patient.  I went over the pictures with her and showed her the finding in the hepatic flexure, and also showed her the pictures of her other polyps that were completely removed  Need for repeat procedure to remove the hepatic flexure polyp were discussed in detail, including risks of the polyp growing larger, and turning into cancer  She is agreeable for repeat colonoscopy to reevaluate the hepatic flexure area  I have discussed alternative options, risks & benefits,  which include, but are not limited to, bleeding, infection, perforation,respiratory complication & drug reaction.  The patient agrees with this plan & written consent will be obtained.    I have recommended that she have the procedure in 3 to 6 months time at the latest, she would like to schedule the procedure in 6 months time. In the meantime I have asked her to call us if she develops any new symptoms, including altered bowel habits, blood in stool, abdominal pain, weight loss, any new family members with colon cancer, or any other reason for concern.  She verbalized understanding.  She required a 2-day prep with her last procedures, and still had a fair prep on the second procedure.  We will thus plan on scheduling her with a 2-day prep, and I have asked  her to start taking MiraLAX 17 g over-the-counter daily, starting 7 days prior to the colonoscopy itself.  She verbalized understanding.  I have asked her to hydrate well with her colonoscopy prep as well.  Dr Vonda Antigua

## 2018-02-01 NOTE — Patient Instructions (Signed)
F/U  3 months 

## 2018-02-10 ENCOUNTER — Telehealth: Payer: Self-pay

## 2018-02-10 ENCOUNTER — Ambulatory Visit (INDEPENDENT_AMBULATORY_CARE_PROVIDER_SITE_OTHER): Payer: Medicare HMO

## 2018-02-10 VITALS — BP 128/70 | HR 61 | Temp 97.9°F | Resp 12 | Ht 66.0 in | Wt 118.3 lb

## 2018-02-10 DIAGNOSIS — R634 Abnormal weight loss: Secondary | ICD-10-CM

## 2018-02-10 DIAGNOSIS — Z0000 Encounter for general adult medical examination without abnormal findings: Secondary | ICD-10-CM | POA: Diagnosis not present

## 2018-02-10 DIAGNOSIS — R638 Other symptoms and signs concerning food and fluid intake: Secondary | ICD-10-CM | POA: Diagnosis not present

## 2018-02-10 DIAGNOSIS — Z598 Other problems related to housing and economic circumstances: Secondary | ICD-10-CM | POA: Diagnosis not present

## 2018-02-10 DIAGNOSIS — Z599 Problem related to housing and economic circumstances, unspecified: Secondary | ICD-10-CM

## 2018-02-10 NOTE — Progress Notes (Signed)
Subjective:   Mary [name] is a 67 y.o. female who presents for Medicare Annual (Subsequent) preventive examination.  Review of Systems:  N/A Cardiac Risk Factors include: hypertension;sedentary lifestyle;smoking/ tobacco exposure;dyslipidemia     Objective:     Vitals: BP 128/70 (BP Location: Left Arm, Patient Position: Sitting, Cuff Size: Normal)   Pulse 61   Temp 97.9 F (36.6 C) (Oral)   Resp 12   Ht '5\' 6"'  (1.676 m)   Wt 118 lb 4.8 oz (53.7 kg)   SpO2 97%   BMI 19.09 kg/m   Body mass index is 19.09 kg/m.  Advanced Directives 02/10/2018 01/14/2018 01/13/2018 01/11/2018 10/27/2017 10/27/2017 10/13/2017  Does Patient Have a Medical Advance Directive? No No No No No No No  Would patient like information on creating a medical advance directive? Yes (MAU/Ambulatory/Procedural Areas - Information given) No - Patient declined - - No - Patient declined - No - Patient declined    Tobacco Social History   Tobacco Use  Smoking Status Current Every Day Smoker  . Packs/day: 0.50  . Years: 40.00  . Pack years: 20.00  . Types: Cigarettes  Smokeless Tobacco Never Used     Ready to quit: No Counseling given: Yes  Clinical Intake:  Pre-visit preparation completed: Yes  Pain : No/denies pain   BMI - recorded: 19.09 Nutritional Status: BMI of 19-24  Normal Nutritional Risks: Unintentional weight loss(Recommended protein shakes) Diabetes: No  Pt has had a significant weight loss over past month. Also is unable to afford dentures. Referral placed with [facility] for assistance with protein shakes and dentures. Also placed referral for pt to be seen by nutritionist. Pt is aware that she will receive a call from our office re: her nutritional appt, as well as a call from my Care Guide re: assistance with protein shakes and dentures. Pt scheduled to follow up with Dr. Sanda Klein in July. Uncertain if pt may benefit from an appetite stimulant.  How often do you need to have someone help you when you  read instructions, pamphlets, or other written materials from your doctor or pharmacy?: 1 - Never  Interpreter Needed?: No  Information entered by :: Idell Pickles, LPN  Past Medical History:  Diagnosis Date  . Arthritis   . Hyperlipidemia   . Hypertension   . Hyperuricemia   . Osteoporosis   . PAD (peripheral artery disease) ([place]) 02/2010   angiplasty, popliteal artery  . Tobacco abuse    Past Surgical History:  Procedure Laterality Date  . ANGIOPLASTY  July 2011   PAD- popliteal artery  . COLONOSCOPY WITH PROPOFOL N/A 01/13/2018   Procedure: COLONOSCOPY WITH PROPOFOL;  Surgeon: Lin Landsman, MD;  [facility] ENDOSCOPY;  Service: Gastroenterology;  Laterality: N/A;  . COLONOSCOPY WITH PROPOFOL N/A 01/14/2018   Procedure: COLONOSCOPY WITH PROPOFOL;  Surgeon: Virgel Manifold, MD;  Location: [facility] ENDOSCOPY;  Service: Endoscopy;  Laterality: N/A;  . CORONARY ANGIOPLASTY    . JOINT REPLACEMENT    . Leg Bypass Surgery Right 08/2010  . OVARIAN CYST SURGERY  1980s  . POPLITEAL ARTERY STENT Right 05/2010  . POPLITEAL ARTERY STENT Right 07/2010  . TOTAL HIP ARTHROPLASTY Left 10/27/2017   Procedure: TOTAL HIP ARTHROPLASTY;  Surgeon: Earnestine Leys, MD;  Location: [facility] ORS;  Service: Orthopedics;  Laterality: Left;   Family History  Problem Relation Age of Onset  . Arthritis Mother   . Diabetes Mother   . Stroke Mother   . Hypertension Mother   . Dementia  Mother   . Cancer Father   . Arthritis Sister   . Seizures Sister   . Diabetes Sister   . Hypertension Sister   . COPD Sister   . Cancer Brother   . Stroke Maternal Grandmother   . Diabetes Paternal Grandmother   . Cancer Sister   . Diabetes Sister   . Arthritis Sister   . Diabetes Brother   . Stroke Brother   . Healthy Brother   . Diabetes Brother   . Stroke Brother   . Cancer Brother   . Heart disease Neg Hx   . Breast cancer Neg Hx    Social History   Socioeconomic History  . Marital status: Married     Spouse name: Joien  . Number of children: 0  . Years of education: Not on file  . Highest education level: 12th grade  Occupational History  . Occupation: Disabled  Social Needs  . Financial resource strain: Somewhat hard  . Food insecurity:    Worry: Never true    Inability: Never true  . Transportation needs:    Medical: No    Non-medical: No  Tobacco Use  . Smoking status: Current Every Day Smoker    Packs/day: 0.50    Years: 40.00    Pack years: 20.00    Types: Cigarettes  . Smokeless tobacco: Never Used  Substance and Sexual Activity  . Alcohol use: No  . Drug use: No  . Sexual activity: Not Currently  Lifestyle  . Physical activity:    Days per week: 0 days    Minutes per session: 0 min  . Stress: Not at all  Relationships  . Social connections:    Talks on phone: Patient refused    Gets together: Patient refused    Attends religious service: Patient refused    Active member of club or organization: Patient refused    Attends meetings of clubs or organizations: Patient refused    Relationship status: Married  Other Topics Concern  . Not on file  Social History Narrative  . Not on file    Outpatient Encounter Medications as of 02/10/2018  Medication Sig  . acetaminophen (TYLENOL) 500 MG tablet Take 500 mg by mouth 2 (two) times daily as needed for moderate pain or headache.  Marland Kitchen amLODipine (NORVASC) 5 MG tablet Take 1 tablet (5 mg total) by mouth daily. If top number 140 or higher  . atorvastatin (LIPITOR) 10 MG tablet TAKE 1 TABLET BY MOUTH ONCE DAILY  . Blood Pressure Monitoring (BLOOD PRESSURE KIT) DEVI 1 each by Does not apply route 2 (two) times daily. Fluctuating blood pressures, DX I10  . ferrous sulfate 325 (65 FE) MG tablet Take 325 mg by mouth daily.  Marland Kitchen gabapentin (NEURONTIN) 300 MG capsule TAKE ONE CAPSULE BY MOUTH TWICE DAILY (Patient taking differently: Take 300 mgs by mouth once daily)  . vitamin E 400 UNIT capsule Take 400 Units by mouth daily.  Marland Kitchen  warfarin (COUMADIN) 5 MG tablet TAKE AS DIRECTED  . HYDROcodone-acetaminophen (NORCO) 5-325 MG tablet Take 1 tablet by mouth every 6 (six) hours as needed. (Patient not taking: Reported on 01/14/2018)   No facility-administered encounter medications on file as of 02/10/2018.     Activities of Daily Living In your present state of health, do you have any difficulty performing the following activities: 02/10/2018 12/03/2017  Hearing? N N  Comment denies hearing aids -  Vision? N N  Comment denies eyeglasses -  Difficulty concentrating or  making decisions? N N  Walking or climbing stairs? N N  Dressing or bathing? N N  Doing errands, shopping? N N  Preparing Food and eating ? N -  Comment denies dentures -  Using the Toilet? N -  In the past six months, have you accidently leaked urine? N -  Do you have problems with loss of bowel control? N -  Managing your Medications? N -  Managing your Finances? N -  Housekeeping or managing your Housekeeping? N -  Some recent data might be hidden    Patient Care Team: Lada, Satira Anis, MD as PCP - General (Family Medicine) Yolonda Kida, MD as Consulting Physician (Cardiology)    Assessment:   This is a routine wellness examination for [facility].  Exercise Activities and Dietary recommendations Current Exercise Habits: The patient does not participate in regular exercise at present, Exercise limited by: None identified  Goals    . DIET - Increase protein intake     Recommend to drink at least 1-2 protein shakes per day to assist with weightloss     . DIET - INCREASE WATER INTAKE     Recommend to drink at least 6-8 8oz glasses of water per day.       Fall Risk Fall Risk  02/10/2018 12/03/2017 11/05/2017 09/27/2017 07/08/2017  Falls in the past year? No No No No No   FALL RISK PREVENTION PERTAINING TO HOME: Is your home free of loose throw rugs in walkways, pet beds, electrical cords, etc? Yes Is there adequate lighting in your home to  reduce risk of falls?  Yes Are there stairs in or around your home WITH handrails? Yes  ASSISTIVE DEVICES UTILIZED TO PREVENT FALLS: Use of a cane, walker or w/c? No Grab bars in the bathroom? No  Shower chair or a place to sit while bathing? No An elevated toilet seat or a handicapped toilet? Yes  Timed Get Up and Go Performed: Yes. Pt ambulated 10 feet within 9 sec. Gait stead-fast and without the use of an assistive device. No intervention required at this time. Fall risk prevention has been discussed.  Community Resource Referral:  Pt declined my offer to send [employer] Referral to Care Guide for installation of grab bars in the shower or a shower chair.   Depression Screen PHQ 2/9 Scores 02/10/2018 12/03/2017 11/05/2017 09/27/2017  PHQ - 2 Score 0 0 0 0  PHQ- 9 Score 0 - - -     Cognitive Function     6CIT Screen 02/10/2018  What Year? 0 points  What month? 0 points  What time? 0 points  Count back from 20 0 points  Months in reverse 2 points  Repeat phrase 2 points  Total Score 4    Immunization History  Administered Date(s) Administered  . Tdap 09/27/2017    Qualifies for Shingles Vaccine? Yes. Due for Shingrix. Education has been provided regarding the importance of this vaccine. Pt has been advised to call her insurance company to determine her out of pocket expense. Advised she may also receive this vaccine at her local pharmacy or Health Dept. Verbalized acceptance and understanding.  Overdue for Flu vaccine. Education has been provided regarding the importance of this vaccine and advised to receive when available. Verbalized acceptance and understanding.  Screening Tests Health Maintenance  Topic Date Due  . PAP SMEAR  07/17/2017  . Hepatitis C Screening  07/08/2018 (Originally March 09, 1951)  . HIV Screening  07/08/2018 (Originally 04/07/1966)  .  INFLUENZA VACCINE  03/31/2018  . MAMMOGRAM  09/30/2018  . TETANUS/TDAP  09/28/2027  . COLONOSCOPY  01/15/2028     Cancer Screenings: Lung: Low Dose CT Chest recommended if Age 43-80 years, 30 pack-year currently smoking OR have quit w/in 15years. Patient does not qualify. Breast:  Up to date on Mammogram? Yes. Completed 09/30/17. Repeat every year   Up to date of Bone Density/Dexa? No. Osteoporotic screening not yet required Colorectal: Completed 01/14/18. Repeat every 6 months  Additional Screenings: Hepatitis C Screening: Declined    Plan:  I have personally reviewed and addressed the Medicare Annual Wellness questionnaire and have noted the following in the patient's chart:  A. Medical and social history B. Use of alcohol, tobacco or illicit drugs  C. Current medications and supplements D. Functional ability and status E.  Nutritional status F.  Physical activity G. Advance directives H. List of other physicians I.  Hospitalizations, surgeries, and ER visits in previous 12 months J.  [place] such as hearing and vision if needed, cognitive and depression L. Referrals and appointments  In addition, I have reviewed and discussed with patient certain preventive protocols, quality metrics, and best practice recommendations. A written personalized care plan for preventive services as well as general preventive health recommendations were provided to patient.  See attached scanned questionnaire for additional information.   Signed,  Aleatha Borer, LPN Nurse Health Advisor

## 2018-02-10 NOTE — Telephone Encounter (Signed)
At Dr. Delight Ovens request, a hold has been placed on her schedule for 02/11/18 @ 8am. Called pt to inquire if she would be able to be seen at that time. Home # rings busy. Will continue efforts to reach pt to ensure she is scheduled within a timely manner for further evaluation.

## 2018-02-10 NOTE — Telephone Encounter (Signed)
Discussed concern about significant weight loss with Dr. Sanda Klein. Pt is scheduled for a CPE in one month. At Dr. Delight Ovens request, a hold has been placed on her schedule for 02/11/18 @ 8am. Called pt to inquire if she would be able to be seen at that time. LVM requesting returned call. Will continue efforts to reach pt to ensure she is scheduled within a timely manner for further evaluation.

## 2018-02-10 NOTE — Patient Instructions (Signed)
Mary [name] , Thank you for taking time to come for your Medicare Wellness Visit. I appreciate your ongoing commitment to your health goals. Please review the following plan we discussed and let me know if I can assist you in the future.   Screening recommendations/referrals: Colorectal Screening: Up to date Mammogram: Up to date Bone Density: Not yet required Lung Cancer Screening: You do not qualify for this screening Hepatitis C Screening: Declined  Vision and Dental Exams: Recommended annual ophthalmology exams for early detection of glaucoma and other disorders of the eye Recommended annual dental exams for proper oral hygiene  Vaccinations: Influenza vaccine: Overdue Pneumococcal vaccine: After age 54 Tdap vaccine: Up to date Shingles vaccine: Please call your insurance company to determine your out of pocket expense for the Shingrix vaccine. You may also receive this vaccine at your local pharmacy or Health Dept.   Advanced directives: Advance directive discussed with you today. I have provided a copy for you to complete at home and have notarized. Once this is complete please bring a copy in to our office so we can scan it into your chart.  Conditions/risks identified: Recommend to drink at least 6-8 8oz glasses of water per day.  Next appointment: Please schedule your Annual Wellness Visit with your Nurse Health Advisor in one year.  Preventive Care 40-64 Years, Female Preventive care refers to lifestyle choices and visits with your health care provider that can promote health and wellness. What does preventive care include?  A yearly physical exam. This is also called an annual well check.  Dental exams once or twice a year.  Routine eye exams. Ask your health care provider how often you should have your eyes checked.  Personal lifestyle choices, including:  Daily care of your teeth and gums.  Regular physical activity.  Eating a healthy diet.  Avoiding tobacco and  drug use.  Limiting alcohol use.  Practicing safe sex.  Taking low-dose aspirin daily starting at age 76.  Taking vitamin and mineral supplements as recommended by your health care provider. What happens during an annual well check? The services and screenings done by your health care provider during your annual well check will depend on your age, overall health, lifestyle risk factors, and family history of disease. Counseling  Your health care provider may ask you questions about your:  Alcohol use.  Tobacco use.  Drug use.  Emotional well-being.  Home and relationship well-being.  Sexual activity.  Eating habits.  Work and work Statistician.  Method of birth control.  Menstrual cycle.  Pregnancy history. Screening  You may have the following tests or measurements:  Height, weight, and BMI.  Blood pressure.  Lipid and cholesterol levels. These may be checked every 5 years, or more frequently if you are over 38 years old.  Skin check.  Lung cancer screening. You may have this screening every year starting at age 85 if you have a 30-pack-year history of smoking and currently smoke or have quit within the past 15 years.  Fecal occult blood test (FOBT) of the stool. You may have this test every year starting at age 40.  Flexible sigmoidoscopy or colonoscopy. You may have a sigmoidoscopy every 5 years or a colonoscopy every 10 years starting at age 23.  Hepatitis C blood test.  Hepatitis B blood test.  Sexually transmitted disease (STD) testing.  Diabetes screening. This is done by checking your blood sugar (glucose) after you have not eaten for a while (fasting). You may have  this done every 1-3 years.  Mammogram. This may be done every 1-2 years. Talk to your health care provider about when you should start having regular mammograms. This may depend on whether you have a family history of breast cancer.  BRCA-related cancer screening. This may be done if  you have a family history of breast, ovarian, tubal, or peritoneal cancers.  Pelvic exam and Pap test. This may be done every 3 years starting at age 69. Starting at age 48, this may be done every 5 years if you have a Pap test in combination with an HPV test.  Bone density scan. This is done to screen for osteoporosis. You may have this scan if you are at high risk for osteoporosis. Discuss your test results, treatment options, and if necessary, the need for more tests with your health care provider. Vaccines  Your health care provider may recommend certain vaccines, such as:  Influenza vaccine. This is recommended every year.  Tetanus, diphtheria, and acellular pertussis (Tdap, Td) vaccine. You may need a Td booster every 10 years.  Zoster vaccine. You may need this after age 3.  Pneumococcal 13-valent conjugate (PCV13) vaccine. You may need this if you have certain conditions and were not previously vaccinated.  Pneumococcal polysaccharide (PPSV23) vaccine. You may need one or two doses if you smoke cigarettes or if you have certain conditions. Talk to your health care provider about which screenings and vaccines you need and how often you need them. This information is not intended to replace advice given to you by your health care provider. Make sure you discuss any questions you have with your health care provider. Document Released: 09/13/2015 Document Revised: 05/06/2016 Document Reviewed: 06/18/2015 Elsevier Interactive Patient Education  2017 [place] Prevention in the Home Falls can cause injuries. They can happen to people of all ages. There are many things you can do to make your home safe and to help prevent falls. What can I do on the outside of my home?  Regularly fix the edges of walkways and driveways and fix any cracks.  Remove anything that might make you trip as you walk through a door, such as a raised step or threshold.  Trim any bushes or trees  on the path to your home.  Use bright outdoor lighting.  Clear any walking paths of anything that might make someone trip, such as rocks or tools.  Regularly check to see if handrails are loose or broken. Make sure that both sides of any steps have handrails.  Any raised decks and porches should have guardrails on the edges.  Have any leaves, snow, or ice cleared regularly.  Use sand or salt on walking paths during winter.  Clean up any spills in your garage right away. This includes oil or grease spills. What can I do in the bathroom?  Use night lights.  Install grab bars by the toilet and in the tub and shower. Do not use towel bars as grab bars.  Use non-skid mats or decals in the tub or shower.  If you need to sit down in the shower, use a plastic, non-slip stool.  Keep the floor dry. Clean up any water that spills on the floor as soon as it happens.  Remove soap buildup in the tub or shower regularly.  Attach bath mats securely with double-sided non-slip rug tape.  Do not have throw rugs and other things on the floor that can make you trip. What  can I do in the bedroom?  Use night lights.  Make sure that you have a light by your bed that is easy to reach.  Do not use any sheets or blankets that are too big for your bed. They should not hang down onto the floor.  Have a firm chair that has side arms. You can use this for support while you get dressed.  Do not have throw rugs and other things on the floor that can make you trip. What can I do in the kitchen?  Clean up any spills right away.  Avoid walking on wet floors.  Keep items that you use a lot in easy-to-reach places.  If you need to reach something above you, use a strong step stool that has a grab bar.  Keep electrical cords out of the way.  Do not use floor polish or wax that makes floors slippery. If you must use wax, use non-skid floor wax.  Do not have throw rugs and other things on the floor  that can make you trip. What can I do with my stairs?  Do not leave any items on the stairs.  Make sure that there are handrails on both sides of the stairs and use them. Fix handrails that are broken or loose. Make sure that handrails are as long as the stairways.  Check any carpeting to make sure that it is firmly attached to the stairs. Fix any carpet that is loose or worn.  Avoid having throw rugs at the top or bottom of the stairs. If you do have throw rugs, attach them to the floor with carpet tape.  Make sure that you have a light switch at the top of the stairs and the bottom of the stairs. If you do not have them, ask someone to add them for you. What else can I do to help prevent falls?  Wear shoes that:  Do not have high heels.  Have rubber bottoms.  Are comfortable and fit you well.  Are closed at the toe. Do not wear sandals.  If you use a stepladder:  Make sure that it is fully opened. Do not climb a closed stepladder.  Make sure that both sides of the stepladder are locked into place.  Ask someone to hold it for you, if possible.  Clearly mark and make sure that you can see:  Any grab bars or handrails.  First and last steps.  Where the edge of each step is.  Use tools that help you move around (mobility aids) if they are needed. These include:  Canes.  Walkers.  Scooters.  Crutches.  Turn on the lights when you go into a dark area. Replace any light bulbs as soon as they burn out.  Set up your furniture so you have a clear path. Avoid moving your furniture around.  If any of your floors are uneven, fix them.  If there are any pets around you, be aware of where they are.  Review your medicines with your doctor. Some medicines can make you feel dizzy. This can increase your chance of falling. Ask your doctor what other things that you can do to help prevent falls. This information is not intended to replace advice given to you by your health  care provider. Make sure you discuss any questions you have with your health care provider. Document Released: 06/13/2009 Document Revised: 01/23/2016 Document Reviewed: 09/21/2014 Elsevier Interactive Patient Education  2017 [place] with Quitting  Smoking Quitting smoking is a physical and mental challenge. You will face cravings, withdrawal symptoms, and temptation. Before quitting, work with your health care provider to make a plan that can help you cope. Preparation can help you quit and keep you from giving in. How can I cope with cravings? Cravings usually last for 5-10 minutes. If you get through it, the craving will pass. Consider taking the following actions to help you cope with cravings:  Keep your mouth busy: ? Chew sugar-free gum. ? Suck on hard candies or a straw. ? Brush your teeth.  Keep your hands and body busy: ? Immediately change to a different activity when you feel a craving. ? Squeeze or play with a ball. ? Do an activity or a hobby, like making bead jewelry, practicing needlepoint, or working with wood. ? Mix up your normal routine. ? Take a short exercise break. Go for a quick walk or run up and down stairs. ? Spend time in public places where smoking is not allowed.  Focus on doing something kind or helpful for someone else.  Call a friend or family member to talk during a craving.  Join a support group.  Call a quit line, such as 1-800-QUIT-NOW.  Talk with your health care provider about medicines that might help you cope with cravings and make quitting easier for you.  How can I deal with withdrawal symptoms? Your body may experience negative effects as it tries to get used to not having nicotine in the system. These effects are called withdrawal symptoms. They may include:  Feeling hungrier than normal.  Trouble concentrating.  Irritability.  Trouble sleeping.  Feeling depressed.  Restlessness and agitation.  Craving a  cigarette.  To manage withdrawal symptoms:  Avoid places, people, and activities that trigger your cravings.  Remember why you want to quit.  Get plenty of sleep.  Avoid coffee and other caffeinated drinks. These may worsen some of your symptoms.  How can I handle social situations? Social situations can be difficult when you are quitting smoking, especially in the first few weeks. To manage this, you can:  Avoid parties, bars, and other social situations where people might be smoking.  Avoid alcohol.  Leave right away if you have the urge to smoke.  Explain to your family and friends that you are quitting smoking. Ask for understanding and support.  Plan activities with friends or family where smoking is not an option.  What are some ways I can cope with stress? Wanting to smoke may cause stress, and stress can make you want to smoke. Find ways to manage your stress. Relaxation techniques can help. For example:  Breathe slowly and deeply, in through your nose and out through your mouth.  Listen to soothing, relaxing music.  Talk with a family member or friend about your stress.  Light a candle.  Soak in a bath or take a shower.  Think about a peaceful place.  What are some ways I can prevent weight gain? Be aware that many people gain weight after they quit smoking. However, not everyone does. To keep from gaining weight, have a plan in place before you quit and stick to the plan after you quit. Your plan should include:  Having healthy snacks. When you have a craving, it may help to: ? Eat plain popcorn, crunchy carrots, celery, or other cut vegetables. ? Chew sugar-free gum.  Changing how you eat: ? Eat small portion sizes at meals. ? Eat 4-6 small  meals throughout the day instead of 1-2 large meals a day. ? Be mindful when you eat. Do not watch television or do other things that might distract you as you eat.  Exercising regularly: ? Make time to exercise each  day. If you do not have time for a long workout, do short bouts of exercise for 5-10 minutes several times a day. ? Do some form of strengthening exercise, like weight lifting, and some form of aerobic exercise, like running or swimming.  Drinking plenty of water or other low-calorie or no-calorie drinks. Drink 6-8 glasses of water daily, or as much as instructed by your health care provider.  Summary  Quitting smoking is a physical and mental challenge. You will face cravings, withdrawal symptoms, and temptation to smoke again. Preparation can help you as you go through these challenges.  You can cope with cravings by keeping your mouth busy (such as by chewing gum), keeping your body and hands busy, and making calls to family, friends, or a helpline for people who want to quit smoking.  You can cope with withdrawal symptoms by avoiding places where people smoke, avoiding drinks with caffeine, and getting plenty of rest.  Ask your health care provider about the different ways to prevent weight gain, avoid stress, and handle social situations. This information is not intended to replace advice given to you by your health care provider. Make sure you discuss any questions you have with your health care provider. Document Released: 08/14/2016 Document Revised: 08/14/2016 Document Reviewed: 08/14/2016 Elsevier Interactive Patient Education  [employer].

## 2018-02-11 NOTE — Telephone Encounter (Signed)
Called pt to schedule for f/u - significant weight loss (Dr. Delight Ovens request). Pt scheduled 02/22/18 @ 8:20am.

## 2018-02-22 ENCOUNTER — Encounter: Payer: Self-pay | Admitting: Family Medicine

## 2018-02-22 ENCOUNTER — Ambulatory Visit (INDEPENDENT_AMBULATORY_CARE_PROVIDER_SITE_OTHER): Payer: Medicare HMO | Admitting: Family Medicine

## 2018-02-22 VITALS — BP 136/78 | HR 100 | Temp 98.1°F | Resp 14 | Ht 66.0 in | Wt 118.0 lb

## 2018-02-22 DIAGNOSIS — E782 Mixed hyperlipidemia: Secondary | ICD-10-CM

## 2018-02-22 DIAGNOSIS — K644 Residual hemorrhoidal skin tags: Secondary | ICD-10-CM | POA: Diagnosis not present

## 2018-02-22 DIAGNOSIS — K089 Disorder of teeth and supporting structures, unspecified: Secondary | ICD-10-CM | POA: Diagnosis not present

## 2018-02-22 DIAGNOSIS — I739 Peripheral vascular disease, unspecified: Secondary | ICD-10-CM | POA: Diagnosis not present

## 2018-02-22 DIAGNOSIS — R634 Abnormal weight loss: Secondary | ICD-10-CM

## 2018-02-22 DIAGNOSIS — Z720 Tobacco use: Secondary | ICD-10-CM

## 2018-02-22 DIAGNOSIS — Z5181 Encounter for therapeutic drug level monitoring: Secondary | ICD-10-CM

## 2018-02-22 DIAGNOSIS — E785 Hyperlipidemia, unspecified: Secondary | ICD-10-CM | POA: Diagnosis not present

## 2018-02-22 DIAGNOSIS — Z7901 Long term (current) use of anticoagulants: Secondary | ICD-10-CM

## 2018-02-22 DIAGNOSIS — D72820 Lymphocytosis (symptomatic): Secondary | ICD-10-CM

## 2018-02-22 DIAGNOSIS — Z124 Encounter for screening for malignant neoplasm of cervix: Secondary | ICD-10-CM | POA: Diagnosis not present

## 2018-02-22 DIAGNOSIS — N390 Urinary tract infection, site not specified: Secondary | ICD-10-CM | POA: Diagnosis not present

## 2018-02-22 MED ORDER — MIRTAZAPINE 7.5 MG PO TABS: 7.5000 mg | ORAL_TABLET | Freq: Every day | ORAL | 2 refills | Status: DC

## 2018-02-22 NOTE — Assessment & Plan Note (Signed)
Seeing Dr. Lucky Cowboy (vascular)

## 2018-02-22 NOTE — Patient Instructions (Addendum)
Try to get some supplemental calories, such as Boost or Ensure Start the Remeron (mirtazipine) to help with your appetite I do encourage you to quit smoking Call [phone] to sign up for smoking cessation classes You can call 1-800-QUIT-NOW to talk with a smoking cessation coach Please call [phone] to schedule your imaging test Please wait 2-3 days after the order has been placed to call and get your test scheduled

## 2018-02-22 NOTE — Assessment & Plan Note (Signed)
Check lipids 

## 2018-02-22 NOTE — Progress Notes (Signed)
BP 136/78   Pulse 100   Temp 98.1 F (36.7 C) (Oral)   Resp 14   Ht '5\' 6"'  (1.676 m)   Wt 118 lb (53.5 kg)   SpO2 94%   BMI 19.05 kg/m    Subjective:    Patient ID: Mary [name], female    DOB: 05-06-1951, 67 y.o.   MRN: [id number]  HPI: Mary [name] is a 67 y.o. female  Chief Complaint  Patient presents with  . Follow-up    weight loss    HPI Patient is here for weight loss She has never been a big eater; sometimes just not hungry Her highest non-pregnant weight was 140 something "I eat because I know I got to" She lost her brother and they were really close; he died in 2023/01/23; 11-24-57 years from massive stroke She has been thinking about him and the weight loss started after his passing  Had a colonoscopy just in May; repeat soon with two day prep; hemorrhoids were causing some bleeding; just a tiny spot the other day; on coumadin for PAD, managed by Dr. Lucky Cowboy (vascular); no other bleeding No swollen lymph nodes; did not have CT scan done in March 2018; she has not noticed any swelling She'll have her physical next month; she had a class II pap smear years ago, followed up with GYN and everything went back to normal after that No night sweats  Depression screen [facility] 2/9 02/22/2018 02/10/2018 12/03/2017 11/05/2017 09/27/2017  Decreased Interest 0 0 0 0 0  Down, Depressed, Hopeless 0 0 0 0 0  PHQ - 2 Score 0 0 0 0 0  Altered sleeping - 0 - - -  Tired, decreased energy - 0 - - -  Change in appetite - 0 - - -  Feeling bad or failure about yourself  - 0 - - -  Trouble concentrating - 0 - - -  Moving slowly or fidgety/restless - 0 - - -  Suicidal thoughts - 0 - - -  PHQ-9 Score - 0 - - -  Difficult doing work/chores - Not difficult at all - - -   Fall Risk  02/22/2018 02/10/2018 12/03/2017 11/05/2017 09/27/2017  Falls in the past year? No No No No No   Functional Status Survey: Is the patient deaf or have difficulty hearing?: No Does the patient have difficulty seeing, even when  wearing glasses/contacts?: No Does the patient have difficulty concentrating, remembering, or making decisions?: No Does the patient have difficulty walking or climbing stairs?: No Does the patient have difficulty dressing or bathing?: No Does the patient have difficulty doing errands alone such as visiting a doctor's office or shopping?: No  HM reviewed; she declined HIV and Hep C testing  Relevant past medical, surgical, family and social history reviewed Past Medical History:  Diagnosis Date  . Arthritis   . Hyperlipidemia   . Hypertension   . Hyperuricemia   . Osteoporosis   . PAD (peripheral artery disease) ([place]) 02/2010   angiplasty, popliteal artery  . Tobacco abuse    Past Surgical History:  Procedure Laterality Date  . ANGIOPLASTY  July 2011   PAD- popliteal artery  . COLONOSCOPY WITH PROPOFOL N/A 01/13/2018   Procedure: COLONOSCOPY WITH PROPOFOL;  Surgeon: Lin Landsman, MD;  Location[facility] ENDOSCOPY;  Service: Gastroenterology;  Laterality: N/A;  . COLONOSCOPY WITH PROPOFOL N/A 01/14/2018   Procedure: COLONOSCOPY WITH PROPOFOL;  Surgeon: Virgel Manifold, MD;  Location: [facility] ENDOSCOPY;  Service: Endoscopy;  Laterality: N/A;  . CORONARY ANGIOPLASTY    . JOINT REPLACEMENT    . Leg Bypass Surgery Right 08/2010  . OVARIAN CYST SURGERY  1980s  . POPLITEAL ARTERY STENT Right 05/2010  . POPLITEAL ARTERY STENT Right 07/2010  . TOTAL HIP ARTHROPLASTY Left 10/27/2017   Procedure: TOTAL HIP ARTHROPLASTY;  Surgeon: Earnestine Leys, MD;  Location: [facility] ORS;  Service: Orthopedics;  Laterality: Left;   Family History  Problem Relation Age of Onset  . Arthritis Mother   . Diabetes Mother   . Stroke Mother   . Hypertension Mother   . Dementia Mother   . Cancer Father   . Arthritis Sister   . Seizures Sister   . Diabetes Sister   . Hypertension Sister   . COPD Sister   . Cancer Brother   . Stroke Maternal Grandmother   . Diabetes Paternal Grandmother   . Cancer Sister    . Diabetes Sister   . Arthritis Sister   . Diabetes Brother   . Stroke Brother   . Healthy Brother   . Diabetes Brother   . Stroke Brother   . Cancer Brother   . Heart disease Neg Hx   . Breast cancer Neg Hx    Social History   Tobacco Use  . Smoking status: Current Every Day Smoker    Packs/day: 0.50    Years: 40.00    Pack years: 20.00    Types: Cigarettes  . Smokeless tobacco: Never Used  Substance Use Topics  . Alcohol use: No  . Drug use: No     Office Visit from 02/22/2018 in [facility]  AUDIT-C Score  0      Interim medical history since last visit reviewed. Allergies and medications reviewed  Review of Systems  Constitutional: Positive for unexpected weight change. Negative for appetite change.  HENT: Negative for sore throat.   Respiratory: Positive for cough (sometimes but not much, like smoker's cough). Negative for shortness of breath.   Cardiovascular: Negative for chest pain.  Gastrointestinal: Negative for abdominal pain.  Endocrine: Positive for cold intolerance. Negative for polydipsia.       Drinks a lot of [employer]   Genitourinary: Negative for hematuria.  Skin: Negative for color change.  Allergic/Immunologic: Negative for food allergies.  Neurological: Negative for headaches.   Per HPI unless specifically indicated above     Objective:    BP 136/78   Pulse 100   Temp 98.1 F (36.7 C) (Oral)   Resp 14   Ht '5\' 6"'  (1.676 m)   Wt 118 lb (53.5 kg)   SpO2 94%   BMI 19.05 kg/m   Wt Readings from Last 3 Encounters:  02/22/18 118 lb (53.5 kg)  02/10/18 118 lb 4.8 oz (53.7 kg)  01/14/18 124 lb (56.2 kg)    Physical Exam  Constitutional: She appears well-developed and well-nourished. No distress.  thin  HENT:  Head: Normocephalic and atraumatic.  Mouth/Throat: Dental caries present.  Lower dentition in poor repair  Eyes: EOM are normal. No scleral icterus.  Neck: No thyromegaly present.  Cardiovascular:  Normal rate, regular rhythm and normal heart sounds.  No murmur heard. Pulmonary/Chest: Effort normal and breath sounds normal. No respiratory distress. She has no wheezes.  Abdominal: Soft. Bowel sounds are normal. She exhibits no distension.  Musculoskeletal: She exhibits no edema.  Lymphadenopathy:       Right: No inguinal adenopathy present.       Left: No inguinal adenopathy  present.  Neurological: She is alert. She exhibits normal muscle tone.  Skin: Skin is warm and dry. She is not diaphoretic. No pallor.     Soft hyperpigmented nevus LEFT side neck  Psychiatric: She has a normal mood and affect. Her behavior is normal. Judgment and thought content normal.    Results for orders placed or performed during the hospital encounter of 01/14/18  Surgical pathology  Result Value Ref Range   SURGICAL PATHOLOGY      Surgical Pathology CASE: [id number] PATIENT: [facility] Surgical Pathology Report     SPECIMEN SUBMITTED: A. Colon, hepatic flexure lesion; cbx B. Colon polyp x2, sigmoid; cold snare/cbx C. Rectum, random; cbx  CLINICAL HISTORY: None provided  PRE-OPERATIVE DIAGNOSIS: Screening  POST-OPERATIVE DIAGNOSIS: Large external hemorrhoids, hep flexure lesion     DIAGNOSIS: A. COLON LESION, HEPATIC FLEXURE; COLD BIOPSY: - EARLY TUBULAR ADENOMA. - NEGATIVE FOR HIGH-GRADE DYSPLASIA AND MALIGNANCY.  B.  COLON POLYP X2, SIGMOID; COLD SNARE AND COLD BIOPSY: - SESSILE SERRATED ADENOMA (TWO FRAGMENTS). - TANGENTIAL SECTION OF SERRATED POLYP, HYPERPLASTIC POLYP VERSUS EARLY SESSILE SERRATED ADENOMA (ONE FRAGMENT). - NEGATIVE FOR HIGH-GRADE DYSPLASIA AND MALIGNANCY.  C.  RECTUM; COLD BIOPSY: - COLONIC MUCOSA WITH FOCAL MILD HYPERPLASTIC EPITHELIUM, CONSISTENT WITH NON-SPECIFIC REACTIVE CHANGE. - NEGATIVE FOR ISCHEMIA, DYSPLASIA, AND MALIGNANCY.  Comment: Ses sile serrated adenomas (SSAs) are often difficult to detect endoscopically as they are typically  broad flat lesions found in the proximal colon. By morphologic definition, SSAs demonstrate architectural (low grade) dysplasia, with or without concurrent cytologic dysplasia. SSAs are thought to be precursor lesions to a subset of colonic adenocarcinomas that arise through the serrated neoplastic pathway rather than the classical neoplasia pathway. Lesions of the serrated pathway have a high frequency of BRAF mutation and are more likely to be microsatellite unstable compared to tubular / villous adenomas of the classical pathway.  The recommended surveillance interval for a SSA <10 mm with no cytologic dysplasia is 5 years.  A SSA =10 mm and a sessile serrated polyp with cytological dysplasia should be managed like a high risk adenoma (recommended surveillance interval of 3 years).  Serrated polyposis syndrome, as defined by WHO, should be considered with one of the f ollowing criteria: (1) at least 5 serrated polyps proximal to sigmoid, with 2 or more =10 mm; (2) any serrated polyps proximal to sigmoid with family history of serrated polyposis syndrome; and (3) >20 serrated polyps of any size throughout the colon.  References: Guidelines for Colonoscopy Surveillance After Screening and Polypectomy: A Consensus Update by the [place] Multi-Society Task Force on Colorectal Cancer. [employer], 2012.   GROSS DESCRIPTION: A. Labeled: Cbx hepatic flexure lesion Received: In formalin Tissue fragment(s): 1 Size: 0.5 cm Description: Tan fragment Entirely submitted in one cassette.  B. Labeled: Cbx cold snare sigmoid polyps x2 Received: in formalin Tissue fragment(s): 3 Size: 0.3-0.6 cm Description: Pink-Tan fragments, largest marked blue and bisected Entirely submitted in one cassette.  C. Labeled: Cbx rectum random Received: In formalin Tissue fragment(s): 1 Size: 0.4 cm Description: Pink fragment Entirel y submitted in one cassette.   Final  Diagnosis performed by Quay Burow, MD.   Electronically signed 01/18/2018 9:26:07AM The electronic signature indicates that the named Attending Pathologist has evaluated the specimen  Technical component performed at [facility], [address], [place], [place] [address] Lab: [phone] Dir: Rush Farmer, MD, MMM  Professional component performed at [facility], [facility], [place], [place], [place] [address] Lab: [phone] Dir: Baxter Flattery  C. Rubinas, MD       Assessment & Plan:   Problem List Items Addressed This Visit      Cardiovascular and Mediastinum   PAD (peripheral artery disease) (HCC) (Chronic)    Seeing Dr. Lucky Cowboy (vascular)      Relevant Orders   Lipid panel   External hemorrhoids    Just a spot of blood recently, recent colonoscopy with close F/U        Other   Tobacco abuse (Chronic)    Encouraged smoking cessation      Relevant Orders   Urinalysis w microscopic + reflex cultur   CT Chest W Contrast   Lymphocytosis    Check CBC today      Relevant Orders   CBC with Differential/Platelet   CT Chest W Contrast   CT Abdomen Pelvis W Contrast   Hyperlipidemia (Chronic)    Check lipids      Relevant Orders   Lipid panel   Encounter for monitoring Coumadin therapy    Managed by specialist       Other Visit Diagnoses    Abnormal weight loss    -  Primary   may be related to her brother's passing, decrease appetite and intake, but could also be cancer, hyperthyroidism, so will start work-up; remeron Rx, close f/u   Relevant Orders   CBC with Differential/Platelet   COMPLETE METABOLIC PANEL WITH GFR   TSH   T4, free   Urinalysis w microscopic + reflex cultur   CT Chest W Contrast   CT Abdomen Pelvis W Contrast   Cervical cancer screening       GYN at [facility]; patient to return for pap smear within next 30 days   Poor dentition       may also affect intake, contribute to weight loss; she will look into addressing this        Follow up plan: Return in about 1 month (around 03/22/2018).  An after-visit summary was printed and given to the patient at [place].  Please see the patient instructions which may contain other information and recommendations beyond what is mentioned above in the assessment and plan.  Meds ordered this encounter  Medications  . mirtazapine (REMERON) 7.5 MG tablet    Sig: Take 1 tablet (7.5 mg total) by mouth at bedtime.    Dispense:  30 tablet    Refill:  2    Orders Placed This Encounter  Procedures  . CT Chest W Contrast  . CT Abdomen Pelvis W Contrast  . CBC with Differential/Platelet  . COMPLETE METABOLIC PANEL WITH GFR  . Lipid panel  . TSH  . T4, free  . Urinalysis w microscopic + reflex cultur

## 2018-02-22 NOTE — Assessment & Plan Note (Signed)
Just a spot of blood recently, recent colonoscopy with close F/U

## 2018-02-22 NOTE — Assessment & Plan Note (Signed)
Encouraged smoking cessation 

## 2018-02-22 NOTE — Assessment & Plan Note (Signed)
Managed by specialist 

## 2018-02-22 NOTE — Assessment & Plan Note (Signed)
Check CBC today.  

## 2018-02-23 ENCOUNTER — Other Ambulatory Visit: Payer: Self-pay | Admitting: Family Medicine

## 2018-02-23 MED ORDER — NITROFURANTOIN MONOHYD MACRO 100 MG PO CAPS: 100.0000 mg | ORAL_CAPSULE | Freq: Two times a day (BID) | ORAL | 0 refills | Status: DC

## 2018-02-23 MED ORDER — POTASSIUM CHLORIDE ER 10 MEQ PO TBCR: 10.0000 meq | EXTENDED_RELEASE_TABLET | Freq: Two times a day (BID) | ORAL | 0 refills | Status: DC

## 2018-02-23 NOTE — Progress Notes (Signed)
KCl tabs and antibiotic

## 2018-02-24 LAB — CBC WITH DIFFERENTIAL/PLATELET
Basophils Absolute: 29 cells/uL (ref 0–200)
Basophils Relative: 0.5 %
Eosinophils Absolute: 131 cells/uL (ref 15–500)
Eosinophils Relative: 2.3 %
HCT: 40 % (ref 35.0–45.0)
Hemoglobin: 13.8 g/dL (ref 11.7–15.5)
Lymphs Abs: 2993 cells/uL (ref 850–3900)
MCH: 29 pg (ref 27.0–33.0)
MCHC: 34.5 g/dL (ref 32.0–36.0)
MCV: 84 fL (ref 80.0–100.0)
MPV: 9.3 fL (ref 7.5–12.5)
Monocytes Relative: 8.2 %
Neutro Abs: 2081 cells/uL (ref 1500–7800)
Neutrophils Relative %: 36.5 %
Platelets: 298 10*3/uL (ref 140–400)
RBC: 4.76 10*6/uL (ref 3.80–5.10)
RDW: 13.5 % (ref 11.0–15.0)
Total Lymphocyte: 52.5 %
WBC mixed population: 467 cells/uL (ref 200–950)
WBC: 5.7 10*3/uL (ref 3.8–10.8)

## 2018-02-24 LAB — URINALYSIS W MICROSCOPIC + REFLEX CULTURE
Bilirubin Urine: NEGATIVE
Glucose, UA: NEGATIVE
Hgb urine dipstick: NEGATIVE
Hyaline Cast: NONE SEEN /LPF
Ketones, ur: NEGATIVE
Nitrites, Initial: POSITIVE — AB
RBC / HPF: NONE SEEN /HPF (ref 0–2)
Specific Gravity, Urine: 1.017 (ref 1.001–1.03)
Squamous Epithelial / LPF: NONE SEEN /HPF (ref <=5)
pH: 6.5 (ref 5.0–8.0)

## 2018-02-24 LAB — COMPLETE METABOLIC PANEL WITH GFR
AG Ratio: 1.3 (calc) (ref 1.0–2.5)
ALT: 9 U/L (ref 6–29)
AST: 17 U/L (ref 10–35)
Albumin: 4.2 g/dL (ref 3.6–5.1)
Alkaline phosphatase (APISO): 99 U/L (ref 33–130)
BUN: 10 mg/dL (ref 7–25)
CO2: 31 mmol/L (ref 20–32)
Calcium: 9.6 mg/dL (ref 8.6–10.4)
Chloride: 102 mmol/L (ref 98–110)
Creat: 0.78 mg/dL (ref 0.50–0.99)
GFR, Est African American: 93 mL/min/{1.73_m2} (ref >=60)
GFR, Est Non African American: 80 mL/min/{1.73_m2} (ref >=60)
Globulin: 3.2 g/dL (calc) (ref 1.9–3.7)
Glucose, Bld: 73 mg/dL (ref 65–139)
Potassium: 3.3 mmol/L — ABNORMAL LOW (ref 3.5–5.3)
Sodium: 140 mmol/L (ref 135–146)
Total Bilirubin: 0.3 mg/dL (ref 0.2–1.2)
Total Protein: 7.4 g/dL (ref 6.1–8.1)

## 2018-02-24 LAB — CULTURE INDICATED

## 2018-02-24 LAB — URINE CULTURE
MICRO NUMBER:: 90762895
SPECIMEN QUALITY:: ADEQUATE

## 2018-02-24 LAB — LIPID PANEL
Cholesterol: 124 mg/dL (ref <200)
HDL: 41 mg/dL — ABNORMAL LOW (ref >50)
LDL Cholesterol (Calc): 66 mg/dL (calc)
Non-HDL Cholesterol (Calc): 83 mg/dL (calc) (ref <130)
Total CHOL/HDL Ratio: 3 (calc) (ref <5.0)
Triglycerides: 89 mg/dL (ref <150)

## 2018-02-24 LAB — TSH: TSH: 2.13 mIU/L (ref 0.40–4.50)

## 2018-02-24 LAB — T4, FREE: Free T4: 1 ng/dL (ref 0.8–1.8)

## 2018-03-14 ENCOUNTER — Encounter: Payer: Self-pay | Admitting: Family Medicine

## 2018-03-14 ENCOUNTER — Other Ambulatory Visit (HOSPITAL_COMMUNITY)
Admission: RE | Admit: 2018-03-14 | Discharge: 2018-03-14 | Disposition: A | Discharge status: Home / Self Care | Payer: Medicare HMO | Source: Ambulatory Visit | Attending: Family Medicine | Admitting: Family Medicine

## 2018-03-14 ENCOUNTER — Ambulatory Visit (INDEPENDENT_AMBULATORY_CARE_PROVIDER_SITE_OTHER): Payer: Medicare HMO | Admitting: Family Medicine

## 2018-03-14 VITALS — BP 122/72 | HR 96 | Temp 98.2°F | Resp 14 | Ht 66.0 in | Wt 120.4 lb

## 2018-03-14 DIAGNOSIS — Z0000 Encounter for general adult medical examination without abnormal findings: Secondary | ICD-10-CM

## 2018-03-14 DIAGNOSIS — E2839 Other primary ovarian failure: Secondary | ICD-10-CM | POA: Diagnosis not present

## 2018-03-14 DIAGNOSIS — Z720 Tobacco use: Secondary | ICD-10-CM | POA: Diagnosis not present

## 2018-03-14 DIAGNOSIS — Z124 Encounter for screening for malignant neoplasm of cervix: Secondary | ICD-10-CM | POA: Insufficient documentation

## 2018-03-14 NOTE — Progress Notes (Signed)
Patient ID: [name], female   DOB: 05/30/1951, 67 y.o.   MRN: [id number]   Subjective:   Mary [name] is a 67 y.o. female here for a complete physical exam  Interim issues since last visit: no medical excitement  USPSTF grade A and B recommendations Depression:  Depression screen [facility] 2/9 02/22/2018 02/10/2018 12/03/2017 11/05/2017 09/27/2017  Decreased Interest 0 0 0 0 0  Down, Depressed, Hopeless 0 0 0 0 0  PHQ - 2 Score 0 0 0 0 0  Altered sleeping - 0 - - -  Tired, decreased energy - 0 - - -  Change in appetite - 0 - - -  Feeling bad or failure about yourself  - 0 - - -  Trouble concentrating - 0 - - -  Moving slowly or fidgety/restless - 0 - - -  Suicidal thoughts - 0 - - -  PHQ-9 Score - 0 - - -  Difficult doing work/chores - Not difficult at all - - -   Hypertension: BP Readings from Last 3 Encounters:  03/14/18 122/72  02/22/18 136/78  02/10/18 128/70   Obesity: Wt Readings from Last 3 Encounters:  03/14/18 120 lb 6.4 oz (54.6 kg)  02/22/18 118 lb (53.5 kg)  02/10/18 118 lb 4.8 oz (53.7 kg)   BMI Readings from Last 3 Encounters:  03/14/18 19.43 kg/m  02/22/18 19.05 kg/m  02/10/18 19.09 kg/m    Skin cancer: mole on the left shoulder, unchanged, going to see derm; she'll make appt Lung cancer:  Chest CT ordered Breast cancer: Jan 2019; no lumps or bumps Colorectal cancer: just done in May; going to be done in November, not 10 years per HM list Cervical cancer screening: pap smear today, class II pap smear years ago; did not get 3 normal paps in between BRCA gene screening: family hx of breast and/or ovarian cancer and/or metastatic prostate cancer? no HIV, hep B, hep C: postponed, patient politely declined STD testing and prevention (chl/gon/syphilis): not interested Intimate partner violence: no abuse Contraception: n/a  Osteoporosis: orfer DEXA Fall prevention/vitamin D: discussed Immunizations: tetanus UTD; discussed shingrix Diet: so so eater; not  getting 5 fruits/veggies a day; likes peaches, pears, canteloupe Exercise: walks pet, steps Alcohol: "Lord no" Tobacco use: doing a little better; trying to quit AAA: no fam hx Aspirin: not taking Glucose:  Glucose  Date Value Ref Range Status  01/21/2012 117 (H) 65 - 99 mg/dL Final   Glucose, Bld  Date Value Ref Range Status  02/22/2018 73 65 - 139 mg/dL Final    Comment:    .        Non-fasting reference interval .   10/28/2017 106 (H) 65 - 99 mg/dL Final  10/27/2017 98 65 - 99 mg/dL Final   Lipids:  Lab Results  Component Value Date   CHOL 124 02/22/2018   CHOL 129 07/08/2017   CHOL 132 06/30/2016   Lab Results  Component Value Date   HDL 41 (L) 02/22/2018   HDL 46 (L) 07/08/2017   HDL 38 (L) 06/30/2016   Lab Results  Component Value Date   LDLCALC 66 02/22/2018   LDLCALC 62 07/08/2017   LDLCALC 67 06/30/2016   Lab Results  Component Value Date   TRIG 89 02/22/2018   TRIG 129 07/08/2017   TRIG 134 06/30/2016   Lab Results  Component Value Date   CHOLHDL 3.0 02/22/2018   CHOLHDL 2.8 07/08/2017   CHOLHDL 3.5 06/30/2016   No results found  for: LDLDIRECT   Past Medical History:  Diagnosis Date  . Arthritis   . Hyperlipidemia   . Hypertension   . Hyperuricemia   . Osteoporosis   . PAD (peripheral artery disease) ([place]) 02/2010   angiplasty, popliteal artery  . Tobacco abuse    Past Surgical History:  Procedure Laterality Date  . ANGIOPLASTY  July 2011   PAD- popliteal artery  . COLONOSCOPY WITH PROPOFOL N/A 01/13/2018   Procedure: COLONOSCOPY WITH PROPOFOL;  Surgeon: Lin Landsman, MD;  Location: [facility] ENDOSCOPY;  Service: Gastroenterology;  Laterality: N/A;  . COLONOSCOPY WITH PROPOFOL N/A 01/14/2018   Procedure: COLONOSCOPY WITH PROPOFOL;  Surgeon: Virgel Manifold, MD;  Location: [facility] ENDOSCOPY;  Service: Endoscopy;  Laterality: N/A;  . CORONARY ANGIOPLASTY    . JOINT REPLACEMENT    . Leg Bypass Surgery Right 08/2010  . OVARIAN CYST  SURGERY  1980s  . POPLITEAL ARTERY STENT Right 05/2010  . POPLITEAL ARTERY STENT Right 07/2010  . TOTAL HIP ARTHROPLASTY Left 10/27/2017   Procedure: TOTAL HIP ARTHROPLASTY;  Surgeon: Earnestine Leys, MD;  Location: [facility] ORS;  Service: Orthopedics;  Laterality: Left;   Family History  Problem Relation Age of Onset  . Arthritis Mother   . Diabetes Mother   . Stroke Mother   . Hypertension Mother   . Dementia Mother   . Cancer Father   . Arthritis Sister   . Seizures Sister   . Diabetes Sister   . Hypertension Sister   . COPD Sister   . Cancer Brother   . Stroke Maternal Grandmother   . Diabetes Paternal Grandmother   . Cancer Sister   . Diabetes Sister   . Arthritis Sister   . Diabetes Brother   . Stroke Brother   . Healthy Brother   . Diabetes Brother   . Stroke Brother   . Cancer Brother   . Heart disease Neg Hx   . Breast cancer Neg Hx    Social History   Tobacco Use  . Smoking status: Current Every Day Smoker    Packs/day: 0.50    Years: 40.00    Pack years: 20.00    Types: Cigarettes  . Smokeless tobacco: Never Used  Substance Use Topics  . Alcohol use: No  . Drug use: No   Review of Systems  Constitutional: Negative for fever.  Skin:       Boil in the right inguinal crease; using topical    Objective:   Vitals:   03/14/18 0956  BP: 122/72  Pulse: 96  Resp: 14  Temp: 98.2 F (36.8 C)  TempSrc: Oral  SpO2: 96%  Weight: 120 lb 6.4 oz (54.6 kg)  Height: _0  (1.676 m)   Body mass index is 19.43 kg/m. Wt Readings from Last 3 Encounters:  03/14/18 120 lb 6.4 oz (54.6 kg)  02/22/18 118 lb (53.5 kg)  02/10/18 118 lb 4.8 oz (53.7 kg)   Physical Exam  Constitutional: She appears well-developed and well-nourished.  HENT:  Head: Normocephalic and atraumatic.  Eyes: Conjunctivae and EOM are normal. Right eye exhibits no hordeolum. Left eye exhibits no hordeolum. No scleral icterus.  Neck: Carotid bruit is not present. No thyromegaly present.    Cardiovascular: Normal rate, regular rhythm, S1 normal, S2 normal and normal heart sounds.  No extrasystoles are present.  Pulmonary/Chest: Effort normal and breath sounds normal. No respiratory distress. Right breast exhibits no inverted nipple, no mass, no nipple discharge, no skin change and no tenderness. Left breast  exhibits no inverted nipple, no mass, no nipple discharge, no skin change and no tenderness. Breasts are symmetrical.  Abdominal: Soft. Normal appearance and bowel sounds are normal. She exhibits no distension, no abdominal bruit, no pulsatile midline mass and no mass. There is no hepatosplenomegaly. There is no tenderness. No hernia.  Genitourinary: Uterus normal. Pelvic exam was performed with patient prone. There is no rash or lesion on the right labia. There is no rash or lesion on the left labia. Cervix exhibits no motion tenderness, no discharge and no friability. Right adnexum displays no mass, no tenderness and no fullness. Left adnexum displays no mass, no tenderness and no fullness.  Genitourinary Comments: Stenotic os  Musculoskeletal: Normal range of motion. She exhibits no edema.  Lymphadenopathy:       Head (right side): No submandibular adenopathy present.       Head (left side): No submandibular adenopathy present.    She has no cervical adenopathy.    She has no axillary adenopathy.  Neurological: She is alert. She displays no tremor. No cranial nerve deficit. She exhibits normal muscle tone. Gait normal.  Skin: Skin is warm and dry. No bruising and no ecchymosis noted. No cyanosis. No pallor.     Anterior LEFT shoulder, dark papular lesion c/w large SK or hamartoma; sebaceous cyst upper back  Psychiatric: Her speech is normal and behavior is normal. Thought content normal. Her mood appears not anxious. She does not exhibit a depressed mood.    Assessment/Plan:   Problem List Items Addressed This Visit      Other   Tobacco abuse (Chronic)    Encouraged  cessation      Preventative health care - Primary    USPSTF grade A and B recommendations reviewed with patient; age-appropriate recommendations, preventive care, screening tests, etc discussed and encouraged; healthy living encouraged; see AVS for patient education given to patient       Other Visit Diagnoses    Estrogen deficiency       Relevant Orders   DG Bone Density   Cervical cancer screening       Relevant Orders   Cytology - PAP       No orders of the defined types were placed in this encounter.  Orders Placed This Encounter  Procedures  . DG Bone Density    Standing Status:   Future    Standing Expiration Date:   09/14/2018    Order Specific Question:   Reason for Exam (SYMPTOM  OR DIAGNOSIS REQUIRED)    Answer:   postmenopausal, smoker; estrogen deficiency    Order Specific Question:   Preferred imaging location?    Answer:   [facility]    Follow up plan: Return in about 1 year (around 03/15/2019) for complete physical, Medicare Wellness check.  An After Visit Summary was printed and given to the patient.

## 2018-03-14 NOTE — Patient Instructions (Addendum)
Please call [phone] to schedule your imaging tests Please wait 2-3 days after the order has been placed to call and get your test scheduled  Please do call to schedule your bone density study; the number to schedule one at either [facility] or [facility] is [phone] or [phone]  Consider getting the new shingles vaccine called Shingrix; that is available for individuals 65 years of age and older, and is recommended even if you have had shingles in the past and/or already received the old shingles vaccine (Zostavax); it is a two-part series, and is available at many local pharmacies  I do encourage you to quit smoking Call [phone] to sign up for smoking cessation classes You can call 1-800-QUIT-NOW to talk with a smoking cessation coach  Health Maintenance, Female Adopting a healthy lifestyle and getting preventive care can go a long way to promote health and wellness. Talk with your health care provider about what schedule of regular examinations is right for you. This is a good chance for you to check in with your provider about disease prevention and staying healthy. In between checkups, there are plenty of things you can do on your own. Experts have done a lot of research about which lifestyle changes and preventive measures are most likely to keep you healthy. Ask your health care provider for more information. Weight and diet Eat a healthy diet  Be sure to include plenty of vegetables, fruits, low-fat dairy products, and lean protein.  Do not eat a lot of foods high in solid fats, added sugars, or salt.  Get regular exercise. This is one of the most important things you can do for your health. ? Most adults should exercise for at least 150 minutes each week. The exercise should increase your heart rate and make you sweat (moderate-intensity exercise). ? Most adults should also do strengthening exercises at least twice a week. This is in  addition to the moderate-intensity exercise.  Maintain a healthy weight  Body mass index (BMI) is a measurement that can be used to identify possible weight problems. It estimates body fat based on height and weight. Your health care provider can help determine your BMI and help you achieve or maintain a healthy weight.  For females 31 years of age and older: ? A BMI below 18.5 is considered underweight. ? A BMI of 18.5 to 24.9 is normal. ? A BMI of 25 to 29.9 is considered overweight. ? A BMI of 30 and above is considered obese.  Watch levels of cholesterol and blood lipids  You should start having your blood tested for lipids and cholesterol at 65 years of age, then have this test every 5 years.  You may need to have your cholesterol levels checked more often if: ? Your lipid or cholesterol levels are high. ? You are older than 66 years of age. ? You are at high risk for heart disease.  Cancer screening Lung Cancer  Lung cancer screening is recommended for adults 56-40 years old who are at high risk for lung cancer because of a history of smoking.  A yearly low-dose CT scan of the lungs is recommended for people who: ? Currently smoke. ? Have quit within the past 15 years. ? Have at least a 30-pack-year history of smoking. A pack year is smoking an average of one pack of cigarettes a day for 1 year.  Yearly screening should continue until it has been 15 years since you quit.  Yearly screening should stop if you develop a health problem that would prevent you from having lung cancer treatment.  Breast Cancer  Practice breast self-awareness. This means understanding how your breasts normally appear and feel.  It also means doing regular breast self-exams. Let your health care provider know about any changes, no matter how small.  If you are in your 20s or 30s, you should have a clinical breast exam (CBE) by a health care provider every 1-3 years as part of a regular health  exam.  If you are 5 or older, have a CBE every year. Also consider having a breast X-ray (mammogram) every year.  If you have a family history of breast cancer, talk to your health care provider about genetic screening.  If you are at high risk for breast cancer, talk to your health care provider about having an MRI and a mammogram every year.  Breast cancer gene (BRCA) assessment is recommended for women who have family members with BRCA-related cancers. BRCA-related cancers include: ? Breast. ? Ovarian. ? Tubal. ? Peritoneal cancers.  Results of the assessment will determine the need for genetic counseling and BRCA1 and BRCA2 testing.  Cervical Cancer Your health care provider may recommend that you be screened regularly for cancer of the pelvic organs (ovaries, uterus, and vagina). This screening involves a pelvic examination, including checking for microscopic changes to the surface of your cervix (Pap test). You may be encouraged to have this screening done every 3 years, beginning at age 9.  For women ages 71-65, health care providers may recommend pelvic exams and Pap testing every 3 years, or they may recommend the Pap and pelvic exam, combined with testing for human papilloma virus (HPV), every 5 years. Some types of HPV increase your risk of cervical cancer. Testing for HPV may also be done on women of any age with unclear Pap test results.  Other health care providers may not recommend any screening for nonpregnant women who are considered low risk for pelvic cancer and who do not have symptoms. Ask your health care provider if a screening pelvic exam is right for you.  If you have had past treatment for cervical cancer or a condition that could lead to cancer, you need Pap tests and screening for cancer for at least 20 years after your treatment. If Pap tests have been discontinued, your risk factors (such as having a new sexual partner) need to be reassessed to determine if  screening should resume. Some women have medical problems that increase the chance of getting cervical cancer. In these cases, your health care provider may recommend more frequent screening and Pap tests.  Colorectal Cancer  This type of cancer can be detected and often prevented.  Routine colorectal cancer screening usually begins at 65 years of age and continues through 65 years of age.  Your health care provider may recommend screening at an earlier age if you have risk factors for colon cancer.  Your health care provider may also recommend using home test kits to check for hidden blood in the stool.  A small camera at the end of a tube can be used to examine your colon directly (sigmoidoscopy or colonoscopy). This is done to check for the earliest forms of colorectal cancer.  Routine screening usually begins at age 41.  Direct examination of the colon should be repeated every 5-10 years through 65 years of age. However, you may need to be screened more often if early forms of precancerous  polyps or small growths are found.  Skin Cancer  Check your skin from head to toe regularly.  Tell your health care provider about any new moles or changes in moles, especially if there is a change in a mole's shape or color.  Also tell your health care provider if you have a mole that is larger than the size of a pencil eraser.  Always use sunscreen. Apply sunscreen liberally and repeatedly throughout the day.  Protect yourself by wearing long sleeves, pants, a wide-brimmed hat, and sunglasses whenever you are outside.  Heart disease, diabetes, and high blood pressure  High blood pressure causes heart disease and increases the risk of stroke. High blood pressure is more likely to develop in: ? People who have blood pressure in the high end of the normal range (130-139/85-89 mm Hg). ? People who are overweight or obese. ? People who are African American.  If you are 9-32 years of age, have  your blood pressure checked every 3-5 years. If you are 10 years of age or older, have your blood pressure checked every year. You should have your blood pressure measured twice-once when you are at a hospital or clinic, and once when you are not at a hospital or clinic. Record the average of the two measurements. To check your blood pressure when you are not at a hospital or clinic, you can use: ? An automated blood pressure machine at a pharmacy. ? A home blood pressure monitor.  If you are between 64 years and 44 years old, ask your health care provider if you should take aspirin to prevent strokes.  Have regular diabetes screenings. This involves taking a blood sample to check your fasting blood sugar level. ? If you are at a normal weight and have a low risk for diabetes, have this test once every three years after 64 years of age. ? If you are overweight and have a high risk for diabetes, consider being tested at a younger age or more often. Preventing infection Hepatitis B  If you have a higher risk for hepatitis B, you should be screened for this virus. You are considered at high risk for hepatitis B if: ? You were born in a country where hepatitis B is common. Ask your health care provider which countries are considered high risk. ? Your parents were born in a high-risk country, and you have not been immunized against hepatitis B (hepatitis B vaccine). ? You have HIV or AIDS. ? You use needles to inject street drugs. ? You live with someone who has hepatitis B. ? You have had sex with someone who has hepatitis B. ? You get hemodialysis treatment. ? You take certain medicines for conditions, including cancer, organ transplantation, and autoimmune conditions.  Hepatitis C  Blood testing is recommended for: ? Everyone born from [birth year] through 1963. ? Anyone with known risk factors for hepatitis C.  Sexually transmitted infections (STIs)  You should be screened for sexually  transmitted infections (STIs) including gonorrhea and chlamydia if: ? You are sexually active and are younger than 65 years of age. ? You are older than 65 years of age and your health care provider tells you that you are at risk for this type of infection. ? Your sexual activity has changed since you were last screened and you are at an increased risk for chlamydia or gonorrhea. Ask your health care provider if you are at risk.  If you do not have HIV, but are at  risk, it may be recommended that you take a prescription medicine daily to prevent HIV infection. This is called pre-exposure prophylaxis (PrEP). You are considered at risk if: ? You are sexually active and do not regularly use condoms or know the HIV status of your partner(s). ? You take drugs by injection. ? You are sexually active with a partner who has HIV.  Talk with your health care provider about whether you are at high risk of being infected with HIV. If you choose to begin PrEP, you should first be tested for HIV. You should then be tested every 3 months for as long as you are taking PrEP. Pregnancy  If you are premenopausal and you may become pregnant, ask your health care provider about preconception counseling.  If you may become pregnant, take 400 to 800 micrograms (mcg) of folic acid every day.  If you want to prevent pregnancy, talk to your health care provider about birth control (contraception). Osteoporosis and menopause  Osteoporosis is a disease in which the bones lose minerals and strength with aging. This can result in serious bone fractures. Your risk for osteoporosis can be identified using a bone density scan.  If you are 76 years of age or older, or if you are at risk for osteoporosis and fractures, ask your health care provider if you should be screened.  Ask your health care provider whether you should take a calcium or vitamin D supplement to lower your risk for osteoporosis.  Menopause may have  certain physical symptoms and risks.  Hormone replacement therapy may reduce some of these symptoms and risks. Talk to your health care provider about whether hormone replacement therapy is right for you. Follow these instructions at home:  Schedule regular health, dental, and eye exams.  Stay current with your immunizations.  Do not use any tobacco products including cigarettes, chewing tobacco, or electronic cigarettes.  If you are pregnant, do not drink alcohol.  If you are breastfeeding, limit how much and how often you drink alcohol.  Limit alcohol intake to no more than 1 drink per day for nonpregnant women. One drink equals 12 ounces of beer, 5 ounces of wine, or 1 ounces of hard liquor.  Do not use street drugs.  Do not share needles.  Ask your health care provider for help if you need support or information about quitting drugs.  Tell your health care provider if you often feel depressed.  Tell your health care provider if you have ever been abused or do not feel safe at home. This information is not intended to replace advice given to you by your health care provider. Make sure you discuss any questions you have with your health care provider. Document Released: 03/02/2011 Document Revised: 01/23/2016 Document Reviewed: 05/21/2015 Elsevier Interactive Patient Education  [employer].

## 2018-03-14 NOTE — Assessment & Plan Note (Signed)
Encouraged cessation.

## 2018-03-14 NOTE — Assessment & Plan Note (Signed)
USPSTF grade A and B recommendations reviewed with patient; age-appropriate recommendations, preventive care, screening tests, etc discussed and encouraged; healthy living encouraged; see AVS for patient education given to patient  

## 2018-03-16 LAB — CYTOLOGY - PAP
Diagnosis: NEGATIVE
HPV: NOT DETECTED

## 2018-04-06 ENCOUNTER — Encounter: Payer: Self-pay | Admitting: Family Medicine

## 2018-04-06 DIAGNOSIS — N3000 Acute cystitis without hematuria: Secondary | ICD-10-CM | POA: Insufficient documentation

## 2018-04-12 ENCOUNTER — Other Ambulatory Visit (INDEPENDENT_AMBULATORY_CARE_PROVIDER_SITE_OTHER): Payer: Self-pay | Admitting: Vascular Surgery

## 2018-05-16 ENCOUNTER — Ambulatory Visit: Payer: Medicare HMO | Admitting: Gastroenterology

## 2018-05-16 ENCOUNTER — Encounter: Payer: Self-pay | Admitting: Gastroenterology

## 2018-05-16 VITALS — BP 147/70 | HR 90 | Ht 66.0 in | Wt 125.8 lb

## 2018-05-16 DIAGNOSIS — Z86010 Personal history of colonic polyps: Secondary | ICD-10-CM | POA: Diagnosis not present

## 2018-05-16 NOTE — Addendum Note (Signed)
Addended by: Earl Lagos on: 05/16/2018 04:02 PM   Modules accepted: Orders, SmartSet

## 2018-05-16 NOTE — Progress Notes (Signed)
Mary Antigua, MD [address]  [place]  [place], [place] [address]  Main: [phone]  Fax: [phone]   Primary Care Physician: Arnetha Courser, MD  Primary Gastroenterologist:  Dr. Vonda [name]  Chief Complaint  Patient presents with  . Follow-up    to have colonoscopy in 3 months (Dec. 2019, 2 day prep)    HPI: Mary [name] is a 67 y.o. female here for follow-up for history of polyps.She denies abnormal vaginal bleeding, discharge or unusual pelvic pain, no dysuria, frequency or hematuria.  Previous history: Patient had a screening colonoscopy on 5/16 by Dr. Marius Ditch (complete to the cecum), procedure was rescheduled for 5/17 due to poor prep on 5/16.  See detailed procedure report and findings.  In brief, hepatic flexure lesion was seen and biopsied, as it did not appear convincing for polyp.  This shows tubular adenoma which needs to be removed.  5/17 Impression: - Non-thrombosed external hemorrhoids found on perianal  exam. - Mucosal nodule at the hepatic flexure. Biopsied. - One 3 mm polyp in the sigmoid colon, removed with a  cold biopsy forceps. Resected and retrieved. - One 6 mm polyp in the sigmoid colon, removed with a  cold snare. Resected and retrieved. - Erythematous mucosa in the rectum. Biopsied. - The examination was otherwise normal. - The rectum, sigmoid colon, descending colon,  transverse colon, ascending colon and cecum are normal. - The distal rectum and anal verge are normal on  retroflexion view.  5/16 Impression: - Preparation of the colon was poor. - Stool in the entire examined colon. - The distal rectum  and anal verge are normal on  retroflexion view. - No specimens collected.    DIAGNOSIS:  A. COLON LESION, HEPATIC FLEXURE; COLD BIOPSY:  - EARLY TUBULAR ADENOMA.  - NEGATIVE FOR HIGH-GRADE DYSPLASIA AND MALIGNANCY.   B. COLON POLYP X2, SIGMOID; COLD SNARE AND COLD BIOPSY:  - SESSILE SERRATED ADENOMA (TWO FRAGMENTS).  - TANGENTIAL SECTION OF SERRATED POLYP, HYPERPLASTIC POLYP VERSUS EARLY  SESSILE SERRATED ADENOMA (ONE FRAGMENT).  - NEGATIVE FOR HIGH-GRADE DYSPLASIA AND MALIGNANCY.   C. RECTUM; COLD BIOPSY:  - COLONIC MUCOSA WITH FOCAL MILD HYPERPLASTIC EPITHELIUM, CONSISTENT  WITH NON-SPECIFIC REACTIVE CHANGE.  - NEGATIVE FOR ISCHEMIA, DYSPLASIA, AND MALIGNANCY.        Current Outpatient Medications  Medication Sig Dispense Refill  . acetaminophen (TYLENOL) 500 MG tablet Take 500 mg by mouth 2 (two) times daily as needed for moderate pain or headache.    Marland Kitchen amLODipine (NORVASC) 5 MG tablet Take 1 tablet (5 mg total) by mouth daily. If top number 140 or higher    . atorvastatin (LIPITOR) 10 MG tablet TAKE 1 TABLET BY MOUTH ONCE DAILY 90 tablet 2  . Blood Pressure Monitoring (BLOOD PRESSURE KIT) DEVI 1 each by Does not apply route 2 (two) times daily. Fluctuating blood pressures, DX I10 1 Device 0  . ferrous sulfate 325 (65 FE) MG tablet Take 325 mg by mouth daily.    Marland Kitchen gabapentin (NEURONTIN) 300 MG capsule TAKE ONE CAPSULE BY MOUTH TWICE DAILY (Patient taking differently: Take 300 mgs by mouth once daily) 60 capsule 11  . gabapentin (NEURONTIN) 300 MG capsule TAKE 1 CAPSULE BY MOUTH TWICE DAILY 60 capsule 11  . mirtazapine (REMERON) 7.5 MG tablet Take 1 tablet (7.5 mg total) by mouth at bedtime. 30 tablet 2  . potassium chloride (KLOR-CON 10) 10 MEQ tablet Take 1 tablet (10 mEq total) by mouth 2 (two) times daily.  8 tablet 0  . vitamin E 400 UNIT capsule Take 400 Units by mouth daily.    Marland Kitchen warfarin (COUMADIN) 5 MG tablet TAKE AS  DIRECTED 90 tablet 2  . nitrofurantoin, macrocrystal-monohydrate, (MACROBID) 100 MG capsule Take 1 capsule (100 mg total) by mouth 2 (two) times daily. (Patient not taking: Reported on 05/16/2018) 14 capsule 0   No current facility-administered medications for this visit.     Allergies as of 05/16/2018  . (No Known Allergies)    ROS:  General: Negative for anorexia, weight loss, fever, chills, fatigue, weakness. ENT: Negative for hoarseness, difficulty swallowing , nasal congestion. CV: Negative for chest pain, angina, palpitations, dyspnea on exertion, peripheral edema.  Respiratory: Negative for dyspnea at rest, dyspnea on exertion, cough, sputum, wheezing.  GI: See history of present illness. GU:  Negative for dysuria, hematuria, urinary incontinence, urinary frequency, nocturnal urination.  Endo: Negative for unusual weight change.    Physical Examination:   BP (!) 147/70   Pulse 90   Ht '5\' 6"'  (1.676 m)   Wt 125 lb 12.8 oz (57.1 kg)   BMI 20.30 kg/m   General: Well-nourished, well-developed in no acute distress.  Eyes: No icterus. Conjunctivae pink. Mouth: Oropharyngeal mucosa moist and pink , no lesions erythema or exudate. Neck: Supple, Trachea midline Abdomen: Bowel sounds are normal, nontender, nondistended, no hepatosplenomegaly or masses, no abdominal bruits or hernia , no rebound or guarding.   Extremities: No lower extremity edema. No clubbing or deformities. Neuro: Alert and oriented x 3.  Grossly intact. Skin: Warm and dry, no jaundice.   Psych: Alert and cooperative, normal mood and affect.   Labs: CMP     Component Value Date/Time   NA 140 02/22/2018 0903   NA 142 10/17/2015 1017   NA 138 01/21/2012 0740   K 3.3 (L) 02/22/2018 0903   K 2.8 (L) 01/21/2012 0740   CL 102 02/22/2018 0903   CL 98 01/21/2012 0740   CO2 31 02/22/2018 0903   CO2 30 01/21/2012 0740   GLUCOSE 73 02/22/2018 0903   GLUCOSE 117 (H) 01/21/2012 0740   BUN 10 02/22/2018 0903    BUN 6 (L) 10/17/2015 1017   BUN 9 01/21/2012 0740   CREATININE 0.78 02/22/2018 0903   CALCIUM 9.6 02/22/2018 0903   CALCIUM 9.3 01/21/2012 0740   PROT 7.4 02/22/2018 0903   PROT 7.0 10/17/2015 1017   ALBUMIN 4.1 10/13/2017 1332   ALBUMIN 4.1 10/17/2015 1017   AST 17 02/22/2018 0903   ALT 9 02/22/2018 0903   ALKPHOS 85 10/13/2017 1332   BILITOT 0.3 02/22/2018 0903   BILITOT 0.3 10/17/2015 1017   GFRNONAA 80 02/22/2018 0903   GFRAA 93 02/22/2018 0903   Lab Results  Component Value Date   WBC 5.7 02/22/2018   HGB 13.8 02/22/2018   HCT 40.0 02/22/2018   MCV 84.0 02/22/2018   PLT 298 02/22/2018    Imaging Studies: No results found.  Assessment and Plan:   Mary [name] is a 67 y.o. y/o female here for follow-up of history of polyps, with hepatic flexure lesion biopsy did not remove showing tubular adenoma on screening colonoscopy  Need for repeat colonoscopy discussed in detail with the patient again Patient is agreeable with scheduling and proceeding with repeat colonoscopy  I have discussed alternative options, risks & benefits,  which include, but are not limited to, bleeding, infection, perforation,respiratory complication & drug reaction.  The patient agrees with this plan & written consent will be  obtained.    Patient will need 2-day prep for the procedure and she verbalized understanding.  Dr Mary [name]

## 2018-06-06 ENCOUNTER — Ambulatory Visit (INDEPENDENT_AMBULATORY_CARE_PROVIDER_SITE_OTHER): Payer: Medicare HMO | Admitting: Family Medicine

## 2018-06-06 ENCOUNTER — Encounter: Payer: Self-pay | Admitting: Family Medicine

## 2018-06-06 DIAGNOSIS — I10 Essential (primary) hypertension: Secondary | ICD-10-CM | POA: Diagnosis not present

## 2018-06-06 DIAGNOSIS — Z720 Tobacco use: Secondary | ICD-10-CM | POA: Diagnosis not present

## 2018-06-06 DIAGNOSIS — I739 Peripheral vascular disease, unspecified: Secondary | ICD-10-CM | POA: Diagnosis not present

## 2018-06-06 DIAGNOSIS — E782 Mixed hyperlipidemia: Secondary | ICD-10-CM

## 2018-06-06 MED ORDER — MIRTAZAPINE 7.5 MG PO TABS: 7.5000 mg | ORAL_TABLET | Freq: Every day | ORAL | 3 refills | Status: DC

## 2018-06-06 NOTE — Assessment & Plan Note (Signed)
Controlled; continue medicine 

## 2018-06-06 NOTE — Progress Notes (Signed)
BP 122/70   Pulse 91   Temp 98.5 F (36.9 C) (Oral)   Ht 5\' 6"  (1.676 m)   Wt 128 lb (58.1 kg)   SpO2 95%   BMI 20.66 kg/m    Subjective:    Patient ID: Mary [name], female    DOB: Sep 26, 1950, 67 y.o.   MRN: [id number]  HPI: Mary [name] is a 67 y.o. female  Chief Complaint  Patient presents with  . Follow-up    HPI Here for f/u Hypertension; well-controlled; checks away from the doctor, has a cuff; 124/73 yesterday; no side effects from the medicine  High cholesterol; on 10 mg of atorvastatin; limiting fatty meats some; does eat bologna, "ya gotta eat something, you can't take everything away"; does eat some eggs, avoiding yolk  Lab Results  Component Value Date   CHOL 124 02/22/2018   HDL 41 (L) 02/22/2018   LDLCALC 66 02/22/2018   TRIG 89 02/22/2018   CHOLHDL 3.0 02/22/2018   Gained a few pounds, remeron is working; sleeping well  Peripheral vascular disease; seeing vascular specialist; on gabapentin for the cramps  On coumadin; no bleeding; managed by vascular  Low potassium back in June; replaced; no palpitations  HM list: not interested in testing for HIV, hep C; just had colonoscopy done in May, another in November 2019; not interested in flu shot  Depression screen [facility] 2/9 06/06/2018 02/22/2018 02/10/2018 12/03/2017 11/05/2017  Decreased Interest 0 0 0 0 0  Down, Depressed, Hopeless 0 0 0 0 0  PHQ - 2 Score 0 0 0 0 0  Altered sleeping - - 0 - -  Tired, decreased energy - - 0 - -  Change in appetite - - 0 - -  Feeling bad or failure about yourself  - - 0 - -  Trouble concentrating - - 0 - -  Moving slowly or fidgety/restless - - 0 - -  Suicidal thoughts - - 0 - -  PHQ-9 Score - - 0 - -  Difficult doing work/chores - - Not difficult at all - -   Fall Risk  06/06/2018 02/22/2018 02/10/2018 12/03/2017 11/05/2017  Falls in the past year? No No No No No    Relevant past medical, surgical, family and social history reviewed Past Medical History:  Diagnosis  Date  . Arthritis   . Hyperlipidemia   . Hypertension   . Hyperuricemia   . Osteoporosis   . PAD (peripheral artery disease) ([place]) 02/2010   angiplasty, popliteal artery  . Tobacco abuse    Past Surgical History:  Procedure Laterality Date  . ANGIOPLASTY  July 2011   PAD- popliteal artery  . COLONOSCOPY WITH PROPOFOL N/A 01/13/2018   Procedure: COLONOSCOPY WITH PROPOFOL;  Surgeon: Lin Landsman, MD;  Location: [facility] ENDOSCOPY;  Service: Gastroenterology;  Laterality: N/A;  . COLONOSCOPY WITH PROPOFOL N/A 01/14/2018   Procedure: COLONOSCOPY WITH PROPOFOL;  Surgeon: Virgel Manifold, MD;  Location: [facility] ENDOSCOPY;  Service: Endoscopy;  Laterality: N/A;  . CORONARY ANGIOPLASTY    . JOINT REPLACEMENT    . Leg Bypass Surgery Right 08/2010  . OVARIAN CYST SURGERY  1980s  . POPLITEAL ARTERY STENT Right 05/2010  . POPLITEAL ARTERY STENT Right 07/2010  . TOTAL HIP ARTHROPLASTY Left 10/27/2017   Procedure: TOTAL HIP ARTHROPLASTY;  Surgeon: Earnestine Leys, MD;  Location: [facility] ORS;  Service: Orthopedics;  Laterality: Left;   Family History  Problem Relation Age of Onset  . Arthritis Mother   . Diabetes  Mother   . Stroke Mother   . Hypertension Mother   . Dementia Mother   . Cancer Father   . Arthritis Sister   . Seizures Sister   . Diabetes Sister   . Hypertension Sister   . COPD Sister   . Cancer Brother   . Stroke Maternal Grandmother   . Diabetes Paternal Grandmother   . Cancer Sister   . Diabetes Sister   . Arthritis Sister   . Diabetes Brother   . Stroke Brother   . Healthy Brother   . Diabetes Brother   . Stroke Brother   . Cancer Brother   . Heart disease Neg Hx   . Breast cancer Neg Hx    Social History   Tobacco Use  . Smoking status: Current Every Day Smoker    Packs/day: 0.50    Years: 40.00    Pack years: 20.00    Types: Cigarettes  . Smokeless tobacco: Never Used  Substance Use Topics  . Alcohol use: No  . Drug use: No  MD note: patient says "so  so" with smoking; trying to quit; down to 1/2 ppd    Office Visit from 06/06/2018 in [facility]  AUDIT-C Score  0      Interim medical history since last visit reviewed. Allergies and medications reviewed  Review of Systems Per HPI unless specifically indicated above     Objective:    BP 122/70   Pulse 91   Temp 98.5 F (36.9 C) (Oral)   Ht 5\' 6"  (1.676 m)   Wt 128 lb (58.1 kg)   SpO2 95%   BMI 20.66 kg/m   Wt Readings from Last 3 Encounters:  06/06/18 128 lb (58.1 kg)  05/16/18 125 lb 12.8 oz (57.1 kg)  03/14/18 120 lb 6.4 oz (54.6 kg)    Physical Exam  Constitutional: She appears well-developed and well-nourished.  HENT:  Mouth/Throat: Mucous membranes are normal.  Eyes: EOM are normal. No scleral icterus.  Cardiovascular: Normal rate and regular rhythm.  Pulmonary/Chest: Effort normal and breath sounds normal.  Psychiatric: She has a normal mood and affect. Her behavior is normal.   Results for orders placed or performed in visit on 03/14/18  Cytology - PAP  Result Value Ref Range   Adequacy      Satisfactory for evaluation  endocervical/transformation zone component PRESENT.   Diagnosis      NEGATIVE FOR INTRAEPITHELIAL LESIONS OR MALIGNANCY.   HPV NOT DETECTED    Material Submitted CervicoVaginal Pap [ThinPrep Imaged]       Assessment & Plan:   Problem List Items Addressed This Visit      Cardiovascular and Mediastinum   PAD (peripheral artery disease) ([place]) (Chronic)    Managed by vascular; unfortunately, she still smokes      Hypertension (Chronic)    Controlled; continue medicine        Other   Tobacco abuse (Chronic)    Patient is considering quitting but not quite there; I am here to help if and when needed      Hyperlipidemia (Chronic)    Chronic condition; continue statin; reviewed last lipids, at goal (LDL less than 70)          Follow up plan: Return in about 6 months (around 12/06/2018) for follow-up visit  with Dr. Sanda Klein; Medicare wellness when due with LPN.  An after-visit summary was printed and given to the patient at [place].  Please see the patient instructions which  may contain other information and recommendations beyond what is mentioned above in the assessment and plan.  Meds ordered this encounter  Medications  . mirtazapine (REMERON) 7.5 MG tablet    Sig: Take 1 tablet (7.5 mg total) by mouth at bedtime.    Dispense:  90 tablet    Refill:  3    No orders of the defined types were placed in this encounter.

## 2018-06-06 NOTE — Assessment & Plan Note (Signed)
Managed by vascular; unfortunately, she still smokes

## 2018-06-06 NOTE — Assessment & Plan Note (Signed)
Chronic condition; continue statin; reviewed last lipids, at goal (LDL less than 70)

## 2018-06-06 NOTE — Patient Instructions (Signed)
I do recommend yearly flu shots; for individuals who don't want flu shots, try to practice excellent hand hygiene, and avoid nursing homes, day cares, and hospitals during peak flu season; taking additional vitamin C daily during flu/cold season may help boost your immune system too Return in 6 months for visit and fasting labs Medicare Wellness visit when due

## 2018-06-06 NOTE — Assessment & Plan Note (Signed)
Patient is considering quitting but not quite there; I am here to help if and when needed

## 2018-06-07 ENCOUNTER — Ambulatory Visit: Payer: Medicare Other | Admitting: Family Medicine

## 2018-06-29 ENCOUNTER — Telehealth: Payer: Self-pay

## 2018-06-29 NOTE — Telephone Encounter (Signed)
Thank you Roselyn Reef. I have resent (faxed) this to Dr. Eloise Levels.

## 2018-06-29 NOTE — Telephone Encounter (Signed)
Roselyn Reef called back from Dr Delight Ovens [facility] stating they are not the prescribing for the Coumadin. You will need to call Dr Nino [facility] he is the prescribing.

## 2018-06-29 NOTE — Telephone Encounter (Signed)
Notified, to contact that doctor

## 2018-06-29 NOTE — Telephone Encounter (Signed)
Copied from [place] [phone]. Topic: General - Other >> Jun 29, 2018  9:46 AM Oneta Rack wrote: Caller name:Debbie  Relation to pt:  [facility]  Call back number: [phone] fax # [phone]    Reason for call:  Patient scheduled for colonoscopy on 07/06/18 requesting hold of warfarin (COUMADIN) 5 MG tablet prior to procedure

## 2018-06-29 NOTE — Telephone Encounter (Signed)
Left message for Dr. Delight Ovens office that we are in need for approval to hold Coumadin 5 days prior to colonoscopy on 07/06/2018. Message sent 9/16, sent nurse message 10/24 also. I also sent request to A Dr. Hortencia Pilar (hematology/oncology) on 9/30.

## 2018-06-29 NOTE — Telephone Encounter (Signed)
This appears to be prescribed by Dr. Delana Meyer; please contact them for concerns. I

## 2018-07-04 ENCOUNTER — Telehealth (INDEPENDENT_AMBULATORY_CARE_PROVIDER_SITE_OTHER): Payer: Self-pay

## 2018-07-04 ENCOUNTER — Telehealth: Payer: Self-pay | Admitting: Gastroenterology

## 2018-07-04 NOTE — Telephone Encounter (Signed)
I contacted pt and informed her that we have not received arroval for her Coumadin to be stopped 5 days prior to procedure-colonoscopy. She is going to contact Dr. Delana Meyer office and I also have already called today and left message. Pt has already stopped Coumadin on 10/31 per herself but I did explain that we do have to obtain the doctors approval in writing.

## 2018-07-04 NOTE — Telephone Encounter (Signed)
Pt aware to restart Coumadin on 07/07/2018. Verbal order sent through voice mail.

## 2018-07-04 NOTE — Telephone Encounter (Signed)
Ebony from Vein vascular left vm for Mary [name] she spoke with pt she had already stopped her blood thinner 07/01/2018 this is a verbal its ok for her to stop and she may start again nov.7th any questions call [phone]

## 2018-07-04 NOTE — Telephone Encounter (Signed)
Patient called to notify us that her GI office found another polyp and that are doing another colonoscopy on her this Wednesday and that she has already stopped her blood thinner on her own since July 01, 2018.  The nurse form the GI clinic also called to get a verbal for the patient to stop taking the blood thinner.

## 2018-07-04 NOTE — Telephone Encounter (Signed)
Pt notified that we received verbal permission to stop blood thinner as she already has and to start again Nov. 7th.

## 2018-07-05 ENCOUNTER — Encounter: Payer: Self-pay | Admitting: Student

## 2018-07-06 ENCOUNTER — Ambulatory Visit: Payer: Medicare HMO | Admitting: Anesthesiology

## 2018-07-06 ENCOUNTER — Encounter
Admission: RE | Disposition: A | Discharge status: Home / Self Care | Payer: Self-pay | Source: Ambulatory Visit | Attending: Gastroenterology

## 2018-07-06 ENCOUNTER — Encounter: Payer: Self-pay | Admitting: *Deleted

## 2018-07-06 ENCOUNTER — Ambulatory Visit
Admission: RE | Admit: 2018-07-06 | Discharge: 2018-07-06 | Disposition: A | Discharge status: Home / Self Care | Payer: Medicare HMO | Source: Ambulatory Visit | Attending: Gastroenterology | Admitting: Gastroenterology

## 2018-07-06 DIAGNOSIS — Z1211 Encounter for screening for malignant neoplasm of colon: Secondary | ICD-10-CM | POA: Diagnosis not present

## 2018-07-06 DIAGNOSIS — F17210 Nicotine dependence, cigarettes, uncomplicated: Secondary | ICD-10-CM | POA: Diagnosis not present

## 2018-07-06 DIAGNOSIS — D126 Benign neoplasm of colon, unspecified: Secondary | ICD-10-CM | POA: Diagnosis not present

## 2018-07-06 DIAGNOSIS — D1779 Benign lipomatous neoplasm of other sites: Secondary | ICD-10-CM | POA: Insufficient documentation

## 2018-07-06 DIAGNOSIS — D120 Benign neoplasm of cecum: Secondary | ICD-10-CM

## 2018-07-06 DIAGNOSIS — I10 Essential (primary) hypertension: Secondary | ICD-10-CM | POA: Insufficient documentation

## 2018-07-06 DIAGNOSIS — K648 Other hemorrhoids: Secondary | ICD-10-CM | POA: Diagnosis not present

## 2018-07-06 DIAGNOSIS — Z86010 Personal history of colonic polyps: Secondary | ICD-10-CM

## 2018-07-06 DIAGNOSIS — E785 Hyperlipidemia, unspecified: Secondary | ICD-10-CM | POA: Diagnosis not present

## 2018-07-06 DIAGNOSIS — Z7901 Long term (current) use of anticoagulants: Secondary | ICD-10-CM | POA: Insufficient documentation

## 2018-07-06 DIAGNOSIS — I739 Peripheral vascular disease, unspecified: Secondary | ICD-10-CM | POA: Insufficient documentation

## 2018-07-06 DIAGNOSIS — M1990 Unspecified osteoarthritis, unspecified site: Secondary | ICD-10-CM | POA: Insufficient documentation

## 2018-07-06 DIAGNOSIS — K635 Polyp of colon: Secondary | ICD-10-CM | POA: Diagnosis not present

## 2018-07-06 DIAGNOSIS — D123 Benign neoplasm of transverse colon: Secondary | ICD-10-CM

## 2018-07-06 DIAGNOSIS — Z79899 Other long term (current) drug therapy: Secondary | ICD-10-CM | POA: Diagnosis not present

## 2018-07-06 HISTORY — PX: COLONOSCOPY WITH PROPOFOL: SHX5780

## 2018-07-06 SURGERY — COLONOSCOPY WITH PROPOFOL
Anesthesia: General

## 2018-07-06 MED ORDER — LIDOCAINE HCL (PF) 2 % IJ SOLN
INTRAMUSCULAR | Status: AC
  Filled 2018-07-06: qty 10

## 2018-07-06 MED ORDER — PROPOFOL 500 MG/50ML IV EMUL
INTRAVENOUS | Status: AC
  Filled 2018-07-06: qty 50

## 2018-07-06 MED ORDER — PROPOFOL 10 MG/ML IV BOLUS
INTRAVENOUS | Status: DC | PRN
  Administered 2018-07-06 (×2): 28.8 mg via INTRAVENOUS

## 2018-07-06 MED ORDER — SODIUM CHLORIDE 0.9 % IV SOLN
INTRAVENOUS | Status: DC
  Administered 2018-07-06: 08:00:00 via INTRAVENOUS

## 2018-07-06 MED ORDER — SODIUM CHLORIDE 0.9 % IJ SOLN
INTRAMUSCULAR | Status: DC | PRN
  Administered 2018-07-06: 7 mL

## 2018-07-06 MED ORDER — FENTANYL CITRATE (PF) 100 MCG/2ML IJ SOLN
INTRAMUSCULAR | Status: AC
  Filled 2018-07-06: qty 2

## 2018-07-06 MED ORDER — PROPOFOL 500 MG/50ML IV EMUL
INTRAVENOUS | Status: DC | PRN
  Administered 2018-07-06: 140 ug/kg/min via INTRAVENOUS

## 2018-07-06 MED ORDER — FENTANYL CITRATE (PF) 100 MCG/2ML IJ SOLN
INTRAMUSCULAR | Status: DC | PRN
  Administered 2018-07-06: 50 ug via INTRAVENOUS

## 2018-07-06 NOTE — Anesthesia Preprocedure Evaluation (Addendum)
Anesthesia Evaluation  Patient identified by MRN, date of birth, ID band Patient awake    Reviewed: Allergy & Precautions, NPO status , Patient's Chart, lab work & pertinent test results  History of Anesthesia Complications Negative for: history of anesthetic complications  Airway Mallampati: II       Dental  (+) Missing, Chipped, Poor Dentition   Pulmonary neg sleep apnea, neg COPD, Current Smoker,           Cardiovascular hypertension, Pt. on medications (-) Past MI and (-) CHF (-) dysrhythmias (-) Valvular Problems/Murmurs     Neuro/Psych neg Seizures    GI/Hepatic Neg liver ROS, neg GERD  ,  Endo/Other  neg diabetes  Renal/GU negative Renal ROS     Musculoskeletal   Abdominal   Peds  Hematology   Anesthesia Other Findings   Reproductive/Obstetrics                            Anesthesia Physical Anesthesia Plan  ASA: II  Anesthesia Plan: General   Post-op Pain Management:    Induction: Intravenous  PONV Risk Score and Plan: 2 and Propofol infusion and TIVA  Airway Management Planned: Nasal Cannula  Additional Equipment:   Intra-op Plan:   Post-operative Plan:   Informed Consent: I have reviewed the patients History and Physical, chart, labs and discussed the procedure including the risks, benefits and alternatives for the proposed anesthesia with the patient or authorized representative who has indicated his/her understanding and acceptance.     Plan Discussed with:   Anesthesia Plan Comments:         Anesthesia Quick Evaluation

## 2018-07-06 NOTE — Anesthesia Postprocedure Evaluation (Signed)
Anesthesia Post Note  Patient: Mary [name]  Procedure(s) Performed: COLONOSCOPY WITH PROPOFOL (N/A )  Patient location during evaluation: Endoscopy Anesthesia Type: General Level of consciousness: awake and alert Pain management: pain level controlled Vital Signs Assessment: post-procedure vital signs reviewed and stable Respiratory status: spontaneous breathing and respiratory function stable Cardiovascular status: stable Anesthetic complications: no     Last Vitals:  Vitals:   07/06/18 0737 07/06/18 0909  BP: 135/85 (!) 89/56  Pulse: (!) 106   Resp: 18 20  Temp: (!) 36.1 C (!) 36.1 C  SpO2: 99%     Last Pain:  Vitals:   07/06/18 0909  TempSrc: Tympanic  PainSc: 0-No pain                 KEPHART,WILLIAM K

## 2018-07-06 NOTE — Transfer of Care (Signed)
Immediate Anesthesia Transfer of Care Note  Patient: Mary [name]  Procedure(s) Performed: COLONOSCOPY WITH PROPOFOL (N/A )  Patient Location: PACU and Endoscopy Unit  Anesthesia Type:General  Level of Consciousness: sedated  Airway & Oxygen Therapy: Patient Spontanous Breathing and Patient connected to nasal cannula oxygen  Post-op Assessment: Report given to RN and Post -op Vital signs reviewed and stable  Post vital signs: stable  Last Vitals:  Vitals Value Taken Time  BP 89/56 07/06/2018  9:10 AM  Temp    Pulse 81 07/06/2018  9:10 AM  Resp 16 07/06/2018  9:10 AM  SpO2 99 % 07/06/2018  9:10 AM  Vitals shown include unvalidated device data.  Last Pain:  Vitals:   07/06/18 0909  TempSrc: (P) Tympanic         Complications: No apparent anesthesia complications

## 2018-07-06 NOTE — Anesthesia Post-op Follow-up Note (Signed)
Anesthesia QCDR form completed.        

## 2018-07-06 NOTE — H&P (Signed)
Mary Antigua, MD [address], [place], [place], [place], [address] [address], [place], [place], [place], [address] Phone: [phone]  Fax: [phone]  Primary Care Physician:  Arnetha Courser, MD   Pre-Procedure History & Physical: HPI:  Mary [name] is a 67 y.o. female is here for a colonoscopy.   Past Medical History:  Diagnosis Date  . Arthritis   . Hyperlipidemia   . Hypertension   . Hyperuricemia   . Osteoporosis   . PAD (peripheral artery disease) ([place]) 02/2010   angiplasty, popliteal artery  . Tobacco abuse     Past Surgical History:  Procedure Laterality Date  . ANGIOPLASTY  July 2011   PAD- popliteal artery  . COLONOSCOPY WITH PROPOFOL N/A 01/13/2018   Procedure: COLONOSCOPY WITH PROPOFOL;  Surgeon: Lin Landsman, MD;  Location: [facility] ENDOSCOPY;  Service: Gastroenterology;  Laterality: N/A;  . COLONOSCOPY WITH PROPOFOL N/A 01/14/2018   Procedure: COLONOSCOPY WITH PROPOFOL;  Surgeon: Virgel Manifold, MD;  Location: [facility] ENDOSCOPY;  Service: Endoscopy;  Laterality: N/A;  . CORONARY ANGIOPLASTY    . JOINT REPLACEMENT    . Leg Bypass Surgery Right 08/2010  . OVARIAN CYST SURGERY  1980s  . POPLITEAL ARTERY STENT Right 05/2010  . POPLITEAL ARTERY STENT Right 07/2010  . TOTAL HIP ARTHROPLASTY Left 10/27/2017   Procedure: TOTAL HIP ARTHROPLASTY;  Surgeon: Earnestine Leys, MD;  Location: [facility] ORS;  Service: Orthopedics;  Laterality: Left;    Prior to Admission medications   Medication Sig Start Date End Date Taking? Authorizing Provider  amLODipine (NORVASC) 5 MG tablet Take 1 tablet (5 mg total) by mouth daily. If top number 140 or higher 12/03/17  Yes Lada, Satira Anis, MD  acetaminophen (TYLENOL) 500 MG tablet Take 500 mg by mouth 2 (two) times daily as needed for moderate pain or headache.    [provider]  atorvastatin (LIPITOR) 10 MG tablet TAKE 1 TABLET BY MOUTH ONCE DAILY 10/11/17   Algernon Huxley, MD  Blood Pressure Monitoring (BLOOD  PRESSURE KIT) DEVI 1 each by Does not apply route 2 (two) times daily. Fluctuating blood pressures, DX I10 11/05/17   Lada, Satira Anis, MD  ferrous sulfate 325 (65 FE) MG tablet Take 325 mg by mouth daily.    [provider]  gabapentin (NEURONTIN) 300 MG capsule TAKE ONE CAPSULE BY MOUTH TWICE DAILY Patient taking differently: Take 300 mgs by mouth once daily 03/05/17   Stegmayer, Joelene Millin A, PA-C  gabapentin (NEURONTIN) 300 MG capsule TAKE 1 CAPSULE BY MOUTH TWICE DAILY Patient not taking: Reported on 06/06/2018 04/13/18   Stegmayer, Joelene Millin A, PA-C  mirtazapine (REMERON) 7.5 MG tablet Take 1 tablet (7.5 mg total) by mouth at bedtime. 06/06/18   Arnetha Courser, MD  nitrofurantoin, macrocrystal-monohydrate, (MACROBID) 100 MG capsule Take 1 capsule (100 mg total) by mouth 2 (two) times daily. Patient not taking: Reported on 05/16/2018 02/23/18   Arnetha Courser, MD  potassium chloride (KLOR-CON 10) 10 MEQ tablet Take 1 tablet (10 mEq total) by mouth 2 (two) times daily. 02/23/18   Arnetha Courser, MD  vitamin E 400 UNIT capsule Take 400 Units by mouth daily.    [provider]  warfarin (COUMADIN) 5 MG tablet TAKE AS DIRECTED 11/05/17   Schnier, Dolores Lory, MD    Allergies as of 05/17/2018  . (No Known Allergies)    Family History  Problem Relation Age of Onset  . Arthritis Mother   . Diabetes Mother   .  Stroke Mother   . Hypertension Mother   . Dementia Mother   . Cancer Father   . Arthritis Sister   . Seizures Sister   . Diabetes Sister   . Hypertension Sister   . COPD Sister   . Cancer Brother   . Stroke Maternal Grandmother   . Diabetes Paternal Grandmother   . Cancer Sister   . Diabetes Sister   . Arthritis Sister   . Diabetes Brother   . Stroke Brother   . Healthy Brother   . Diabetes Brother   . Stroke Brother   . Cancer Brother   . Heart disease Neg Hx   . Breast cancer Neg Hx     Social History   Socioeconomic History  . Marital status: Married     Spouse name: Joien  . Number of children: 0  . Years of education: Not on file  . Highest education level: 12th grade  Occupational History  . Occupation: Disabled  Social Needs  . Financial resource strain: Somewhat hard  . Food insecurity:    Worry: Never true    Inability: Never true  . Transportation needs:    Medical: No    Non-medical: No  Tobacco Use  . Smoking status: Current Every Day Smoker    Packs/day: 0.50    Years: 40.00    Pack years: 20.00    Types: Cigarettes  . Smokeless tobacco: Never Used  Substance and Sexual Activity  . Alcohol use: No  . Drug use: No  . Sexual activity: Yes  Lifestyle  . Physical activity:    Days per week: 0 days    Minutes per session: 0 min  . Stress: Not at all  Relationships  . Social connections:    Talks on phone: Patient refused    Gets together: Patient refused    Attends religious service: Patient refused    Active member of club or organization: Patient refused    Attends meetings of clubs or organizations: Patient refused    Relationship status: Married  . Intimate partner violence:    Fear of current or ex partner: No    Emotionally abused: No    Physically abused: No    Forced sexual activity: No  Other Topics Concern  . Not on file  Social History Narrative  . Not on file    Review of Systems: See HPI, otherwise negative ROS  Physical Exam: BP 135/85   Pulse (!) 106   Temp (!) 97 F (36.1 C) (Tympanic)   Resp 18   Ht '5\' 6"'  (1.676 m)   Wt 57.6 kg   SpO2 99%   BMI 20.50 kg/m  General:   Alert,  pleasant and cooperative in NAD Head:  Normocephalic and atraumatic. Neck:  Supple; no masses or thyromegaly. Lungs:  Clear throughout to auscultation, normal respiratory effort.    Heart:  +S1, +S2, Regular rate and rhythm, No edema. Abdomen:  Soft, nontender and nondistended. Normal bowel sounds, without guarding, and without rebound.   Neurologic:  Alert and  oriented x4;  grossly normal  neurologically.  Impression/Plan: Mary [name] is here for a colonoscopy to be performed for history of polyps  Risks, benefits, limitations, and alternatives regarding  colonoscopy have been reviewed with the patient.  Questions have been answered.  All parties agreeable.   Virgel Manifold, MD  07/06/2018, 8:08 AM

## 2018-07-06 NOTE — Op Note (Signed)
[facility] Gastroenterology Patient Name: Mary [name] Procedure Date: 07/06/2018 7:59 AM MRN: [id number] Account #: [id number] Date of Birth: [birth year] Admit Type: Outpatient Age: 67 Room: [facility] ENDO ROOM 3 Gender: Female Note Status: Finalized Procedure:            Colonoscopy Indications:          High risk colon cancer surveillance: Personal history                        of colonic polyps Providers:            Ibrahim Mcpheeters B. Bonna Gains MD, MD Referring MD:         Arnetha Courser (Referring MD) Medicines:            Monitored Anesthesia Care Complications:        No immediate complications. Procedure:            Pre-Anesthesia Assessment:                       - ASA Grade Assessment: II - A patient with mild                        systemic disease.                       - Prior to the procedure, a History and Physical was                        performed, and patient medications, allergies and                        sensitivities were reviewed. The patient's tolerance of                        previous anesthesia was reviewed.                       - The risks and benefits of the procedure and the                        sedation options and risks were discussed with the                        patient. All questions were answered and informed                        consent was obtained.                       - Patient identification and proposed procedure were                        verified prior to the procedure by the physician, the                        nurse, the anesthesiologist, the anesthetist and the                        technician. The procedure was verified in the procedure  room.                       After obtaining informed consent, the colonoscope was                        passed under direct vision. Throughout the procedure,                        the patient's blood pressure, pulse, and oxygen   saturations were monitored continuously. The                        Colonoscope was introduced through the anus and                        advanced to the the cecum, identified by appendiceal                        orifice and ileocecal valve. The colonoscopy was                        performed with ease. The patient tolerated the                        procedure well. The quality of the bowel preparation                        was good. Findings:      The perianal and digital rectal examinations were normal.      A 6 mm polyp was found in the cecum. The polyp was sessile. The polyp       was removed with a cold snare. Resection and retrieval were complete.      A 7 mm polyp was found in the hepatic flexure. The polyp was flat. Area       was successfully injected with saline for lesion assessment, and this       injection appeared to lift the lesion adequately. The polyp was removed       with a hot snare. Resection and retrieval were complete. To prevent       bleeding after the polypectomy, one hemostatic clip was successfully       placed. There was no bleeding at the end of the procedure.      The exam was otherwise without abnormality.      The rectum, sigmoid colon, descending colon, transverse colon, ascending       colon and cecum appeared normal.      The retroflexed view of the distal rectum and anal verge was normal and       showed no anal or rectal abnormalities. Impression:           - One 6 mm polyp in the cecum, removed with a cold                        snare. Resected and retrieved.                       - One 7 mm polyp at the hepatic flexure, removed with a  hot snare. Resected and retrieved. Injected. Clip was                        placed.                       - The examination was otherwise normal.                       - The rectum, sigmoid colon, descending colon,                        transverse colon, ascending colon and cecum are  normal.                       - The distal rectum and anal verge are normal on                        retroflexion view. Recommendation:       - Discharge patient to home (with escort).                       - Advance diet as tolerated.                       - Continue present medications.                       - Await pathology results.                       - Repeat colonoscopy date to be determined after                        pending pathology results are reviewed for surveillance.                       - The findings and recommendations were discussed with                        the patient.                       - The findings and recommendations were discussed with                        the patient's family.                       - Return to primary care physician as previously                        scheduled. Procedure Code(s):    --- Professional ---                       [phone], Colonoscopy, flexible; with removal of tumor(s),                        polyp(s), or other lesion(s) by snare technique                       [id number], Colonoscopy, flexible; with directed submucosal  injection(s), any substance Diagnosis Code(s):    --- Professional ---                       Z86.010, Personal history of colonic polyps                       D12.0, Benign neoplasm of cecum                       D12.3, Benign neoplasm of transverse colon (hepatic                        flexure or splenic flexure) CPT copyright 2018 American [employer]. All rights reserved. The codes documented in this report are preliminary and upon coder review may  be revised to meet current compliance requirements.  Vonda Antigua, MD Margretta Sidle B. Bonna Gains MD, MD 07/06/2018 9:12:53 AM This report has been signed electronically. Number of Addenda: 0 Note Initiated On: 07/06/2018 7:59 AM Scope Withdrawal Time: 0 hours 36 minutes 48 seconds  Total Procedure Duration: 0 hours 45 minutes  31 seconds  Estimated Blood Loss: Estimated blood loss: none.      [facility]

## 2018-07-07 ENCOUNTER — Encounter: Payer: Self-pay | Admitting: Gastroenterology

## 2018-07-08 LAB — SURGICAL PATHOLOGY

## 2018-07-11 ENCOUNTER — Encounter: Payer: Self-pay | Admitting: Gastroenterology

## 2018-08-08 ENCOUNTER — Other Ambulatory Visit (INDEPENDENT_AMBULATORY_CARE_PROVIDER_SITE_OTHER): Payer: Self-pay | Admitting: Vascular Surgery

## 2018-08-16 ENCOUNTER — Encounter: Payer: Self-pay | Admitting: Family Medicine

## 2018-08-16 DIAGNOSIS — N390 Urinary tract infection, site not specified: Secondary | ICD-10-CM | POA: Insufficient documentation

## 2018-09-03 ENCOUNTER — Other Ambulatory Visit: Payer: Self-pay | Admitting: Family Medicine

## 2018-12-06 ENCOUNTER — Ambulatory Visit: Payer: Medicare HMO | Admitting: Family Medicine

## 2018-12-08 ENCOUNTER — Encounter: Payer: Self-pay | Admitting: Family Medicine

## 2018-12-08 ENCOUNTER — Telehealth: Payer: Self-pay | Admitting: Family Medicine

## 2018-12-08 ENCOUNTER — Ambulatory Visit: Payer: Medicare HMO | Admitting: Family Medicine

## 2018-12-08 ENCOUNTER — Ambulatory Visit (INDEPENDENT_AMBULATORY_CARE_PROVIDER_SITE_OTHER): Payer: Medicare HMO | Admitting: Family Medicine

## 2018-12-08 ENCOUNTER — Other Ambulatory Visit: Payer: Self-pay

## 2018-12-08 DIAGNOSIS — I739 Peripheral vascular disease, unspecified: Secondary | ICD-10-CM

## 2018-12-08 DIAGNOSIS — I10 Essential (primary) hypertension: Secondary | ICD-10-CM

## 2018-12-08 DIAGNOSIS — E790 Hyperuricemia without signs of inflammatory arthritis and tophaceous disease: Secondary | ICD-10-CM | POA: Diagnosis not present

## 2018-12-08 DIAGNOSIS — E782 Mixed hyperlipidemia: Secondary | ICD-10-CM | POA: Diagnosis not present

## 2018-12-08 DIAGNOSIS — Z5181 Encounter for therapeutic drug level monitoring: Secondary | ICD-10-CM | POA: Diagnosis not present

## 2018-12-08 DIAGNOSIS — Z720 Tobacco use: Secondary | ICD-10-CM

## 2018-12-08 NOTE — Assessment & Plan Note (Signed)
Monitor labs in June

## 2018-12-08 NOTE — Telephone Encounter (Signed)
Please schedule patient for a f/u visit with Dr. Sanda Klein in six months Dx: HTN, PVD Thank you

## 2018-12-08 NOTE — Assessment & Plan Note (Signed)
Proud of her for trying to cut down; complete cessation advised

## 2018-12-08 NOTE — Assessment & Plan Note (Signed)
Managed by vascular; on warfarin

## 2018-12-08 NOTE — Assessment & Plan Note (Signed)
She will check and notify me if over 140/90; continue medicine; start to read labels and limit salt

## 2018-12-08 NOTE — Assessment & Plan Note (Signed)
She will try epsom salt soak for foot rather than medicines which can impact her warfarin; if absolute necessary, could start medicine but she'd need to have INR checked with vascular doctor; avoid triggers like gravy and shrimp

## 2018-12-08 NOTE — Assessment & Plan Note (Addendum)
Try to limit saturated fats; continue statin; check lipids on or after June 26th; 8-noon or 2-4 pm and patient may come on her own; okay to eat

## 2018-12-08 NOTE — Progress Notes (Signed)
BP 137/74    Pulse 82    Subjective:    Patient ID: Mary [name], female    DOB: 19-Jun-1951, 68 y.o.   MRN: [id number]  HPI: Mary [name] is a 68 y.o. female  Chief Complaint  Patient presents with   Follow-up    HPI Virtual Visit via Telephone/Video Note   I connect with the patient via: telephone I verified that I am speaking with the correct person using two identifiers.  Call started: 10:43 am Call terminated: 11:01 am  Total length of call: 17 minutes and 24 seconds   I discussed the limitations, risks, security, and privacy concerns of performing an evaluation and management service by telephone and the availability of in-person appointments. Staff discussed with the patient that he/she may be responsible for charges related to this service. The patient expressed understanding and agreed to proceed.  Patient location: living room at home; husband outside Provider location: home Additional participants: none  COVID-19 discussed; no exposures; no SHOB, cough, fever; at high risk for complications due to age; she is watching the news; she understands the severity  Hypertension; a little high recently; she says 137/74; she says she is trying to limit salt; not really reading labels, she will start doing that  Peripheral vascular disease; managed by Dr. Lucky Cowboy; on coumadin/warfarin; no easy bruising or bleeding; toes and feet do not turn purple or go cold  She has gout in her right foot; her brother had gout a lot too; not sure what trigered that flare; no shrimp in over a month; no [place]; same spot every time; only one in a long long time  High cholesterol; on statin;  Lab Results  Component Value Date   CHOL 124 02/22/2018   HDL 41 (L) 02/22/2018   [place] 66 02/22/2018   TRIG 89 02/22/2018   CHOLHDL 3.0 02/22/2018   Tobacco abuse; she is still smoking, but cutting down as much as she can; she lost her mother last month; a little rough she says; cig calms her  nerves down; she talked a little about her mother, 46 years old, "Matie I love you too"; then caregiver gave her a shower, so tired; put on pajamas, then oldest sister getting meds refilled, Blanch Media come here, something not right, sitting on side of the bed and laid back and she was gone; she knows she is in a better place; still has empty spot in her heart; condolences offered  Depression screen [facility] 2/9 12/08/2018 06/06/2018 02/22/2018 02/10/2018 12/03/2017  Decreased Interest 0 0 0 0 0  Down, Depressed, Hopeless 0 0 0 0 0  PHQ - 2 Score 0 0 0 0 0  Altered sleeping 0 - - 0 -  Tired, decreased energy 0 - - 0 -  Change in appetite 0 - - 0 -  Feeling bad or failure about yourself  0 - - 0 -  Trouble concentrating 0 - - 0 -  Moving slowly or fidgety/restless 0 - - 0 -  Suicidal thoughts 0 - - 0 -  PHQ-9 Score 0 - - 0 -  Difficult doing work/chores Not difficult at all - - Not difficult at all -   Fall Risk  12/08/2018 06/06/2018 02/22/2018 02/10/2018 12/03/2017  Falls in the past year? 0 No No No No    Relevant past medical, surgical, family and social history reviewed Past Medical History:  Diagnosis Date   Arthritis    Hyperlipidemia    Hypertension  Hyperuricemia    Osteoporosis    PAD (peripheral artery disease) ([place]) 02/2010   angiplasty, popliteal artery   Tobacco abuse    Past Surgical History:  Procedure Laterality Date   ANGIOPLASTY  July 2011   PAD- popliteal artery   COLONOSCOPY WITH PROPOFOL N/A 01/13/2018   Procedure: COLONOSCOPY WITH PROPOFOL;  Surgeon: Lin Landsman, MD;  Location[facility] ENDOSCOPY;  Service: Gastroenterology;  Laterality: N/A;   COLONOSCOPY WITH PROPOFOL N/A 01/14/2018   Procedure: COLONOSCOPY WITH PROPOFOL;  Surgeon: Virgel Manifold, MD;  Location: [facility] ENDOSCOPY;  Service: Endoscopy;  Laterality: N/A;   COLONOSCOPY WITH PROPOFOL N/A 07/06/2018   Procedure: COLONOSCOPY WITH PROPOFOL;  Surgeon: Virgel Manifold, MD;  Location: [facility]  ENDOSCOPY;  Service: Endoscopy;  Laterality: N/A;   CORONARY ANGIOPLASTY     JOINT REPLACEMENT     Leg Bypass Surgery Right 08/2010   OVARIAN CYST SURGERY  1980s   POPLITEAL ARTERY STENT Right 05/2010   POPLITEAL ARTERY STENT Right 07/2010   TOTAL HIP ARTHROPLASTY Left 10/27/2017   Procedure: TOTAL HIP ARTHROPLASTY;  Surgeon: Earnestine Leys, MD;  Location: [facility] ORS;  Service: Orthopedics;  Laterality: Left;   Family History  Problem Relation Age of Onset   Arthritis Mother    Diabetes Mother    Stroke Mother    Hypertension Mother    Dementia Mother    Cancer Father    Arthritis Sister    Seizures Sister    Diabetes Sister    Hypertension Sister    COPD Sister    Cancer Brother    Stroke Maternal Grandmother    Diabetes Paternal Grandmother    Cancer Sister    Diabetes Sister    Arthritis Sister    Diabetes Brother    Stroke Brother    Healthy Brother    Diabetes Brother    Stroke Brother    Cancer Brother    Heart disease Neg Hx    Breast cancer Neg Hx    Social History   Tobacco Use   Smoking status: Current Every Day Smoker    Packs/day: 0.50    Years: 40.00    Pack years: 20.00    Types: Cigarettes   Smokeless tobacco: Never Used  Substance Use Topics   Alcohol use: No   Drug use: No     [facility] Visit from 12/08/2018 in [facility]  AUDIT-C Score  0      Interim medical history since last visit reviewed. Allergies and medications reviewed  Review of Systems Per HPI unless specifically indicated above     Objective:    BP 137/74    Pulse 82   Wt Readings from Last 3 Encounters:  07/06/18 127 lb (57.6 kg)  06/06/18 128 lb (58.1 kg)  05/16/18 125 lb 12.8 oz (57.1 kg)    Physical Exam Pulmonary:     Effort: No respiratory distress.  Neurological:     Mental Status: She is alert.     Cranial Nerves: No dysarthria.  Psychiatric:        Speech: Speech is not rapid and pressured, delayed or  slurred.       Assessment & Plan:   Problem List Items Addressed This Visit      Cardiovascular and Mediastinum   PAD (peripheral artery disease) ([place]) (Chronic)    Managed by vascular; on warfarin      Relevant Orders   CBC   Hypertension (Chronic)    She will check and  notify me if over 140/90; continue medicine; start to read labels and limit salt        Other   Tobacco abuse (Chronic)    Proud of her for trying to cut down; complete cessation advised      Medication monitoring encounter    Monitor labs in June      Relevant Orders   COMPLETE METABOLIC PANEL WITH GFR   CBC   Hyperuricemia (Chronic)    She will try epsom salt soak for foot rather than medicines which can impact her warfarin; if absolute necessary, could start medicine but she'd need to have INR checked with vascular doctor; avoid triggers like gravy and shrimp      Relevant Orders   Uric acid   Hyperlipidemia (Chronic)    Try to limit saturated fats; continue statin; check lipids on or after June 26th; 8-noon or 2-4 pm and patient may come on her own; okay to eat      Relevant Orders   Lipid panel      Follow up plan: Return in about 3 months (around 02/24/2019) for nonfasting labs only; SIX months with Dr. Sanda Klein.  An after-visit summary was printed and given to the patient at [facility].  Please see the patient instructions which may contain other information and recommendations beyond what is mentioned above in the assessment and plan.  No orders of the defined types were placed in this encounter.   Orders Placed This Encounter  Procedures   COMPLETE METABOLIC PANEL WITH GFR   CBC   Lipid panel   Uric acid

## 2018-12-09 NOTE — Telephone Encounter (Signed)
Appt made

## 2019-02-16 ENCOUNTER — Ambulatory Visit (INDEPENDENT_AMBULATORY_CARE_PROVIDER_SITE_OTHER): Payer: Medicare HMO

## 2019-02-16 VITALS — BP 140/82 | Ht 66.0 in | Wt 127.0 lb

## 2019-02-16 DIAGNOSIS — Z1231 Encounter for screening mammogram for malignant neoplasm of breast: Secondary | ICD-10-CM

## 2019-02-16 DIAGNOSIS — I2510 Atherosclerotic heart disease of native coronary artery without angina pectoris: Secondary | ICD-10-CM | POA: Insufficient documentation

## 2019-02-16 DIAGNOSIS — Z0000 Encounter for general adult medical examination without abnormal findings: Secondary | ICD-10-CM | POA: Diagnosis not present

## 2019-02-16 NOTE — Progress Notes (Signed)
Subjective:   Mary [name] is a 68 y.o. female who presents for Medicare Annual (Subsequent) preventive examination.   Virtual Visit via Telephone Note  I connected with Mary [name] on 02/16/19 at  9:40 AM EDT by telephone and verified that I am speaking with the correct person using two identifiers.  Medicare Annual Wellness visit completed telephonically due to Covid-19 pandemic.   Location: Patient: home Provider: office   I discussed the limitations, risks, security and privacy concerns of performing an evaluation and management service by telephone and the availability of in person appointments. The patient expressed understanding and agreed to proceed.  Some vital signs may be absent or patient reported.   Clemetine Marker, LPN   Review of Systems:   Cardiac Risk Factors include: advanced age (>39mn, >>86women);dyslipidemia;hypertension     Objective:     Vitals: BP 140/82   Ht '5\' 6"'  (1.676 m)   Wt 127 lb (57.6 kg)   BMI 20.50 kg/m   Body mass index is 20.5 kg/m.  Advanced Directives 02/16/2019 07/06/2018 02/10/2018 01/14/2018 01/13/2018 01/11/2018 10/27/2017  Does Patient Have a Medical Advance Directive? No No No No No No No  Would patient like information on creating a medical advance directive? No - Patient declined No - Patient declined Yes (MAU/Ambulatory/Procedural Areas - Information given) No - Patient declined - - No - Patient declined    Tobacco Social History   Tobacco Use  Smoking Status Current Every Day Smoker  . Packs/day: 0.25  . Years: 40.00  . Pack years: 10.00  . Types: Cigarettes  Smokeless Tobacco Never Used     Ready to quit: Not Answered Counseling given: Not Answered   Clinical Intake:  Pre-visit preparation completed: Yes  Pain : No/denies pain     BMI - recorded: 20.5 Nutritional Status: BMI of 19-24  Normal Nutritional Risks: None Diabetes: No  How often do you need to have someone help you when you read instructions,  pamphlets, or other written materials from your doctor or pharmacy?: 1 - Never  Interpreter Needed?: No  Information entered by :: KClemetine MarkerLPN  Past Medical History:  Diagnosis Date  . Arthritis   . Hyperlipidemia   . Hypertension   . Hyperuricemia   . Osteoporosis   . PAD [place]peripheral artery disease) [place]   angiplasty, popliteal artery  . Tobacco abuse    Past Surgical History:  Procedure Laterality Date  . ANGIOPLASTY  July 2011   PAD- popliteal artery  . COLONOSCOPY WITH PROPOFOL N/A 01/13/2018   Procedure: COLONOSCOPY WITH PROPOFOL;  Surgeon: VLin Landsman MD;  [facility];  Service: Gastroenterology;  Laterality: N/A;  . COLONOSCOPY WITH PROPOFOL N/A 01/14/2018   Procedure: COLONOSCOPY WITH PROPOFOL;  Surgeon: TVirgel Manifold MD;  Location: [facility] ENDOSCOPY;  Service: Endoscopy;  Laterality: N/A;  . COLONOSCOPY WITH PROPOFOL N/A 07/06/2018   Procedure: COLONOSCOPY WITH PROPOFOL;  Surgeon: TVirgel Manifold MD;  Location: [facility] ENDOSCOPY;  Service: Endoscopy;  Laterality: N/A;  . CORONARY ANGIOPLASTY    . JOINT REPLACEMENT    . Leg Bypass Surgery Right 08/2010  . OVARIAN CYST SURGERY  1980s  . POPLITEAL ARTERY STENT Right 05/2010  . POPLITEAL ARTERY STENT Right 07/2010  . TOTAL HIP ARTHROPLASTY Left 10/27/2017   Procedure: TOTAL HIP ARTHROPLASTY;  Surgeon: MEarnestine Leys MD;  Location: [facility] ORS;  Service: Orthopedics;  Laterality: Left;   Family History  Problem Relation Age of Onset  . Arthritis Mother   .  Diabetes Mother   . Stroke Mother   . Hypertension Mother   . Dementia Mother   . Cancer Father   . Arthritis Sister   . Seizures Sister   . Diabetes Sister   . Hypertension Sister   . COPD Sister   . Cancer Brother   . Stroke Maternal Grandmother   . Diabetes Paternal Grandmother   . Cancer Sister   . Diabetes Sister   . Arthritis Sister   . Diabetes Brother   . Stroke Brother   . Healthy Brother   . Diabetes Brother    . Stroke Brother   . Cancer Brother   . Heart disease Neg Hx   . Breast cancer Neg Hx    Social History   Socioeconomic History  . Marital status: Married    Spouse name: Mary [name]  . Number of children: 0  . Years of education: Not on file  . Highest education level: 12th grade  Occupational History  . Occupation: Disabled  Social Needs  . Financial resource strain: Not very hard  . Food insecurity    Worry: Never true    Inability: Never true  . Transportation needs    Medical: No    Non-medical: No  Tobacco Use  . Smoking status: Current Every Day Smoker    Packs/day: 0.25    Years: 40.00    Pack years: 10.00    Types: Cigarettes  . Smokeless tobacco: Never Used  Substance and Sexual Activity  . Alcohol use: No  . Drug use: No  . Sexual activity: Yes  Lifestyle  . Physical activity    Days per week: 7 days    Minutes per session: 20 min  . Stress: Not at all  Relationships  . Social connections    Talks on phone: More than three times a week    Gets together: Twice a week    Attends religious service: Never    Active member of club or organization: No    Attends meetings of clubs or organizations: Never    Relationship status: Married  Other Topics Concern  . Not on file  Social History Narrative  . Not on file    Outpatient Encounter Medications as of 02/16/2019  Medication Sig  . acetaminophen (TYLENOL) 500 MG tablet Take 500 mg by mouth 2 (two) times daily as needed for moderate pain or headache.  Marland Kitchen amLODipine (NORVASC) 5 MG tablet TAKE 1 TABLET BY MOUTH ONCE DAILY  . atorvastatin (LIPITOR) 10 MG tablet TAKE 1 TABLET BY MOUTH ONCE DAILY  . Blood Pressure Monitoring (BLOOD PRESSURE KIT) DEVI 1 each by Does not apply route 2 (two) times daily. Fluctuating blood pressures, DX I10  . cholecalciferol (VITAMIN D3) 25 MCG (1000 UT) tablet Take 1,000 Units by mouth every other day.  . gabapentin (NEURONTIN) 300 MG capsule TAKE ONE CAPSULE BY MOUTH TWICE DAILY  (Patient taking differently: Take 300 mgs by mouth once daily)  . mirtazapine (REMERON) 7.5 MG tablet Take 1 tablet (7.5 mg total) by mouth at bedtime.  . potassium chloride (KLOR-CON 10) 10 MEQ tablet Take 1 tablet (10 mEq total) by mouth 2 (two) times daily.  . vitamin E 400 UNIT capsule Take 400 Units by mouth daily.  Marland Kitchen warfarin (COUMADIN) 5 MG tablet TAKE AS DIRECTED  . ferrous sulfate 325 (65 FE) MG tablet Take 325 mg by mouth daily.  . [DISCONTINUED] gabapentin (NEURONTIN) 300 MG capsule TAKE 1 CAPSULE BY MOUTH TWICE DAILY (Patient not  taking: Reported on 06/06/2018)   No facility-administered encounter medications on file as of 02/16/2019.     Activities of Daily Living In your present state of health, do you have any difficulty performing the following activities: 02/16/2019 12/08/2018  Hearing? N N  Comment declines hearing aids -  Vision? N N  Comment reading glasses -  Difficulty concentrating or making decisions? N N  Walking or climbing stairs? N N  Dressing or bathing? N N  Doing errands, shopping? N N  Preparing Food and eating ? N -  Using the Toilet? N -  In the past six months, have you accidently leaked urine? N -  Do you have problems with loss of bowel control? N -  Managing your Medications? N -  Managing your Finances? N -  Housekeeping or managing your Housekeeping? N -  Some recent data might be hidden    Patient Care Team: Lada, Satira Anis, MD as PCP - General (Family Medicine) Yolonda Kida, MD as Consulting Physician (Cardiology)    Assessment:   This is a routine wellness examination for [place].  Exercise Activities and Dietary recommendations Current Exercise Habits: Home exercise routine, Type of exercise: walking, Time (Minutes): 20, Frequency (Times/Week): 7, Weekly Exercise (Minutes/Week): 140, Intensity: Mild, Exercise limited by: None identified  Goals    . DIET - Increase protein intake     Recommend to drink at least 1-2 protein shakes  per day to assist with weightloss       . DIET - INCREASE WATER INTAKE     Recommend to drink at least 6-8 8oz glasses of water per day.       Fall Risk Fall Risk  02/16/2019 12/08/2018 06/06/2018 02/22/2018 02/10/2018  Falls in the past year? 0 0 No No No  Number falls in past yr: 0 - - - -  Injury with Fall? 0 - - - -  Follow up Falls prevention discussed - - - -   FALL RISK PREVENTION PERTAINING TO THE HOME:  Any stairs in or around the home? Yes  If so, do they handrails? Yes   Home free of loose throw rugs in walkways, pet beds, electrical cords, etc? Yes  Adequate lighting in your home to reduce risk of falls? Yes   ASSISTIVE DEVICES UTILIZED TO PREVENT FALLS:  Life alert? No  Use of a cane, walker or w/c? No  Grab bars in the bathroom? Yes  Shower chair or bench in shower? No  Elevated toilet seat or a handicapped toilet? Yes   DME ORDERS:  DME order needed?  No   TIMED UP AND GO:  Was the test performed? No . Telephonic visit.   Education: Fall risk prevention has been discussed.  Intervention(s) required? No   Depression Screen PHQ 2/9 Scores 02/16/2019 12/08/2018 06/06/2018 02/22/2018  PHQ - 2 Score 0 0 0 0  PHQ- 9 Score 0 0 - -     Cognitive Function     6CIT Screen 02/16/2019 02/10/2018  What Year? 0 points 0 points  What month? 0 points 0 points  What time? 0 points 0 points  Count back from 20 0 points 0 points  Months in reverse 0 points 2 points  Repeat phrase 0 points 2 points  Total Score 0 4    Immunization History  Administered Date(s) Administered  . Tdap 09/27/2017    Qualifies for Shingles Vaccine? Yes . Due for Shingrix. Education has been provided regarding the importance of this  vaccine. Pt has been advised to call insurance company to determine out of pocket expense. Advised may also receive vaccine at local pharmacy or Health Dept. Verbalized acceptance and understanding.  Tdap: Up to date   Flu Vaccine: Due for Flu vaccine. Does  the patient want to receive this vaccine today?  No . Education has been provided regarding the importance of this vaccine but still declined. Advised may receive this vaccine at local pharmacy or Health Dept. Aware to provide a copy of the vaccination record if obtained from local pharmacy or Health Dept. Verbalized acceptance and understanding.  Pneumococcal Vaccine: Due for Pneumococcal vaccine. Does the patient want to receive this vaccine today?  No . Education has been provided regarding the importance of this vaccine but still declined. Advised may receive this vaccine at local pharmacy or Health Dept. Aware to provide a copy of the vaccination record if obtained from local pharmacy or Health Dept. Verbalized acceptance and understanding.   Screening Tests Health Maintenance  Topic Date Due  . MAMMOGRAM  09/30/2018  . PNA vac Low Risk Adult (1 of 2 - PCV13) 06/07/2019 (Originally 04/07/2018)  . DEXA SCAN  09/01/2019 (Originally 04/07/2018)  . Hepatitis C Screening  09/01/2019 (Originally November 25, 1950)  . HIV Screening  09/01/2019 (Originally [birth year]/1968)  . INFLUENZA VACCINE  04/01/2019  . COLONOSCOPY  07/06/2020  . PAP SMEAR-Modifier  03/14/2021  . TETANUS/TDAP  09/28/2027    Cancer Screenings:  Colorectal Screening: Completed 07/06/18. Repeat every 2 years  Mammogram: Completed 09/30/17. Repeat every year. Ordered today. Pt provided with contact information and advised to call to schedule appt.   Bone Density: pt declined screening at this time  Lung Cancer Screening: (Low Dose CT Chest recommended if Age 18-80 years, 30 pack-year currently smoking OR have quit w/in 15years.) does not qualify.    Additional Screening:  Hepatitis C Screening: does qualify; postponed  Vision Screening: Recommended annual ophthalmology exams for early detection of glaucoma and other disorders of the eye. Is the patient up to date with their annual eye exam?  No  Who is the provider or what is the name of  the office in which the pt attends annual eye exams? No  If pt is not established with a provider, would they like to be referred to a provider to establish care? No .   Dental Screening: Recommended annual dental exams for proper oral hygiene  Community Resource Referral:  CRR required this visit?  No      Plan:    I have personally reviewed and addressed the Medicare Annual Wellness questionnaire and have noted the following in the patient's chart:  A. Medical and social history B. Use of alcohol, tobacco or illicit drugs  C. Current medications and supplements D. Functional ability and status E.  Nutritional status F.  Physical activity G. Advance directives H. List of other physicians I.  Hospitalizations, surgeries, and ER visits in previous 12 months J.  [place] such as hearing and vision if needed, cognitive and depression L. Referrals and appointments   In addition, I have reviewed and discussed with patient certain preventive protocols, quality metrics, and best practice recommendations. A written personalized care plan for preventive services as well as general preventive health recommendations were provided to patient.   Signed,  Clemetine Marker, LPN Nurse Health Advisor   Nurse Notes: pt doing well and appreciative of visit today

## 2019-02-16 NOTE — Patient Instructions (Signed)
Mary Mary [name] , Thank you for taking time to come for your Medicare Wellness Visit. I appreciate your ongoing commitment to your health goals. Please review the following plan we discussed and let me know if I can assist you in the future.   Screening recommendations/referrals: Colonoscopy: done 07/06/18. Repeat in 2021 Mammogram: done 09/30/17. Please call [phone] to schedule your mammogram.  Bone Density: postponed Recommended yearly ophthalmology/optometry visit for glaucoma screening and checkup Recommended yearly dental visit for hygiene and checkup  Vaccinations: Influenza vaccine: postponed Pneumococcal vaccine: postponed Tdap vaccine: done 09/27/17 Shingles vaccine: Shingrix discussed. Please contact your pharmacy for coverage information.   Advanced directives: Advance directive discussed with you today. Even though you declined this today please call our office should you change your mind and we can give you the proper paperwork for you to fill out.  Conditions/risks identified: recommend drinking 6-8 glasses of water per day  Next appointment: Please follow up in one year for your Medicare Annual Wellness visit.     Preventive Care 66 Years and Older, Female Preventive care refers to lifestyle choices and visits with your health care provider that can promote health and wellness. What does preventive care include?  A yearly physical exam. This is also called an annual well check.  Dental exams once or twice a year.  Routine eye exams. Ask your health care provider Mary [name] often you should have your eyes checked.  Personal lifestyle choices, including:  Daily care of your teeth and gums.  Regular physical activity.  Eating a healthy diet.  Avoiding tobacco and drug use.  Limiting alcohol use.  Practicing safe sex.  Taking low-dose aspirin every day.  Taking vitamin and mineral supplements as recommended by your health care provider. What happens during an annual  well check? The services and screenings done by your health care provider during your annual well check will depend on your age, overall health, lifestyle risk factors, and family history of disease. Counseling  Your health care provider may ask you questions about your:  Alcohol use.  Tobacco use.  Drug use.  Emotional well-being.  Home and relationship well-being.  Sexual activity.  Eating habits.  History of falls.  Memory and ability to understand (cognition).  Work and work Statistician.  Reproductive health. Screening  You may have the following tests or measurements:  Height, weight, and BMI.  Blood pressure.  Lipid and cholesterol levels. These may be checked every 5 years, or more frequently if you are over 61 years old.  Skin check.  Lung cancer screening. You may have this screening every year starting at age 50 if you have a 30-pack-year history of smoking and currently smoke or have quit within the past 15 years.  Fecal occult blood test (FOBT) of the stool. You may have this test every year starting at age 47.  Flexible sigmoidoscopy or colonoscopy. You may have a sigmoidoscopy every 5 years or a colonoscopy every 10 years starting at age 43.  Hepatitis C blood test.  Hepatitis B blood test.  Sexually transmitted disease (STD) testing.  Diabetes screening. This is done by checking your blood sugar (glucose) after you have not eaten for a while (fasting). You may have this done every 1-3 years.  Bone density scan. This is done to screen for osteoporosis. You may have this done starting at age 47.  Mammogram. This may be done every 1-2 years. Talk to your health care provider about Mary [name] often you should have regular mammograms. Talk  with your health care provider about your test results, treatment options, and if necessary, the need for more tests. Vaccines  Your health care provider may recommend certain vaccines, such as:  Influenza vaccine. This  is recommended every year.  Tetanus, diphtheria, and acellular pertussis (Tdap, Td) vaccine. You may need a Td booster every 10 years.  Zoster vaccine. You may need this after age 7.  Pneumococcal 13-valent conjugate (PCV13) vaccine. One dose is recommended after age 23.  Pneumococcal polysaccharide (PPSV23) vaccine. One dose is recommended after age 76. Talk to your health care provider about which screenings and vaccines you need and Mary [name] often you need them. This information is not intended to replace advice given to you by your health care provider. Make sure you discuss any questions you have with your health care provider. Document Released: 09/13/2015 Document Revised: 05/06/2016 Document Reviewed: 06/18/2015 Elsevier Interactive Patient Education  2017 [place] Prevention in the Home Falls can cause injuries. They can happen to people of all ages. There are many things you can do to make your home safe and to help prevent falls. What can I do on the outside of my home?  Regularly fix the edges of walkways and driveways and fix any cracks.  Remove anything that might make you trip as you walk through a door, such as a raised step or threshold.  Trim any bushes or trees on the path to your home.  Use bright outdoor lighting.  Clear any walking paths of anything that might make someone trip, such as rocks or tools.  Regularly check to see if handrails are loose or broken. Make sure that both sides of any steps have handrails.  Any raised decks and porches should have guardrails on the edges.  Have any leaves, snow, or ice cleared regularly.  Use sand or salt on walking paths during winter.  Clean up any spills in your garage right away. This includes oil or grease spills. What can I do in the bathroom?  Use night lights.  Install grab bars by the toilet and in the tub and shower. Do not use towel bars as grab bars.  Use non-skid mats or decals in the tub or  shower.  If you need to sit down in the shower, use a plastic, non-slip stool.  Keep the floor dry. Clean up any water that spills on the floor as soon as it happens.  Remove soap buildup in the tub or shower regularly.  Attach bath mats securely with double-sided non-slip rug tape.  Do not have throw rugs and other things on the floor that can make you trip. What can I do in the bedroom?  Use night lights.  Make sure that you have a light by your bed that is easy to reach.  Do not use any sheets or blankets that are too big for your bed. They should not hang down onto the floor.  Have a firm chair that has side arms. You can use this for support while you get dressed.  Do not have throw rugs and other things on the floor that can make you trip. What can I do in the kitchen?  Clean up any spills right away.  Avoid walking on wet floors.  Keep items that you use a lot in easy-to-reach places.  If you need to reach something above you, use a strong step stool that has a grab bar.  Keep electrical cords out of the way.  Do  not use floor polish or wax that makes floors slippery. If you must use wax, use non-skid floor wax.  Do not have throw rugs and other things on the floor that can make you trip. What can I do with my stairs?  Do not leave any items on the stairs.  Make sure that there are handrails on both sides of the stairs and use them. Fix handrails that are broken or loose. Make sure that handrails are as long as the stairways.  Check any carpeting to make sure that it is firmly attached to the stairs. Fix any carpet that is loose or worn.  Avoid having throw rugs at the top or bottom of the stairs. If you do have throw rugs, attach them to the floor with carpet tape.  Make sure that you have a light switch at the top of the stairs and the bottom of the stairs. If you do not have them, ask someone to add them for you. What else can I do to help prevent falls?   Wear shoes that:  Do not have high heels.  Have rubber bottoms.  Are comfortable and fit you well.  Are closed at the toe. Do not wear sandals.  If you use a stepladder:  Make sure that it is fully opened. Do not climb a closed stepladder.  Make sure that both sides of the stepladder are locked into place.  Ask someone to hold it for you, if possible.  Clearly mark and make sure that you can see:  Any grab bars or handrails.  First and last steps.  Where the edge of each step is.  Use tools that help you move around (mobility aids) if they are needed. These include:  Canes.  Walkers.  Scooters.  Crutches.  Turn on the lights when you go into a dark area. Replace any light bulbs as soon as they burn out.  Set up your furniture so you have a clear path. Avoid moving your furniture around.  If any of your floors are uneven, fix them.  If there are any pets around you, be aware of where they are.  Review your medicines with your doctor. Some medicines can make you feel dizzy. This can increase your chance of falling. Ask your doctor what other things that you can do to help prevent falls. This information is not intended to replace advice given to you by your health care provider. Make sure you discuss any questions you have with your health care provider. Document Released: 06/13/2009 Document Revised: 01/23/2016 Document Reviewed: 09/21/2014 Elsevier Interactive Patient Education  2017 [employer].

## 2019-03-06 ENCOUNTER — Other Ambulatory Visit: Payer: Self-pay | Admitting: Family Medicine

## 2019-03-17 ENCOUNTER — Encounter: Payer: Medicare HMO | Admitting: Family Medicine

## 2019-03-29 ENCOUNTER — Ambulatory Visit (INDEPENDENT_AMBULATORY_CARE_PROVIDER_SITE_OTHER): Payer: Medicare HMO | Admitting: Nurse Practitioner

## 2019-03-29 ENCOUNTER — Other Ambulatory Visit: Payer: Self-pay

## 2019-03-29 ENCOUNTER — Encounter: Payer: Self-pay | Admitting: Nurse Practitioner

## 2019-03-29 VITALS — BP 132/70 | HR 88 | Temp 96.6°F | Resp 14 | Ht 66.0 in | Wt 133.3 lb

## 2019-03-29 DIAGNOSIS — Z13820 Encounter for screening for osteoporosis: Secondary | ICD-10-CM

## 2019-03-29 DIAGNOSIS — Z1159 Encounter for screening for other viral diseases: Secondary | ICD-10-CM

## 2019-03-29 DIAGNOSIS — Z1239 Encounter for other screening for malignant neoplasm of breast: Secondary | ICD-10-CM | POA: Diagnosis not present

## 2019-03-29 DIAGNOSIS — E782 Mixed hyperlipidemia: Secondary | ICD-10-CM

## 2019-03-29 DIAGNOSIS — Z0000 Encounter for general adult medical examination without abnormal findings: Secondary | ICD-10-CM

## 2019-03-29 DIAGNOSIS — Z114 Encounter for screening for human immunodeficiency virus [HIV]: Secondary | ICD-10-CM | POA: Diagnosis not present

## 2019-03-29 NOTE — Patient Instructions (Addendum)
General recommendations: 150 minutes of physical activity weekly, eat two servings of fish weekly, eat one serving of tree nuts ( cashews, pistachios, pecans, almonds.Marland Kitchen) every other day, eat 6 servings of fruit/vegetables daily and drink plenty of water and avoid sweet beverages. Recommend at least 64 ounces of water daily.   Please do call to schedule your mammogram & DEXA scan; the number to schedule one at either [facility] or [facility] is [phone]  Good cholesterol, also called high-density lipoprotein (HDL) removes extra cholesterol and plaque buildup in your arteries and then sends it to your liver to get rid of and helps reduce your risk of heart disease, heart attack, and stroke.Foods that increase HDL: beans and legumes, whole grains, high-fiber fruits:prunes, apples, and pears; fatty fish- salmon, tuna, sardines; nuts, olive oil   Stay Safe in the [place] The majority of sun exposure occurs before age 93 and skin cancer can take 20 years or more to develop. Whether your sun bathing days are behind you or you still spend time pursuing the perfect tan, you should be concerned about skin cancer.  Remember, the sun's ultraviolet (UV) rays can reflect off water, sand, concrete and snow, and can reach below the water's surface. Certain types of UV light penetrate fog and clouds, so it's possible to get sunburn even on overcast days.  Avoid direct sunlight as much as possible during the peak sun hours, generally 10 a.m. to 3 p.m., or seek shade during this part of day. Wear broad-spectrum sunscreen - with an SPF of at least 30 - containing both UVA and UVB protection. Look for ingredients like [employer] (also known as avobenzone) or titanium dioxide on the label. Reapply sunscreen frequently, at least every two hours when outdoors, especially if you perspire or you've been swimming. Your best bet is to choose water-resistant products that are more likely to stay on  your skin. Wear lip balm with an SPF 15 or higher. Wear a hat and other protective clothing while in the sun. Tightly woven fibers and darker clothing generally provide more protection. Also, look for products approved by the American Academy of Dermatology. Wear UV-protective sunglasses.

## 2019-03-29 NOTE — Progress Notes (Signed)
Name: Mary [name]   MRN: [id number]    DOB: 25-Feb-1951   Date:03/29/2019       Progress Note  Subjective  Chief Complaint  Chief Complaint  Patient presents with  . Annual Exam    HPI  Patient presents for annual CPE .  Diet:  Eggs, salmon, chicken, rice, corn, squash, tomato sandwiches, burgers, potatoes, lima beans Eats vegetables every day to every other day Eats fruits maybe twice week Drinks mountain dew 2L in 2-3 days. Drinks water 2 glasses a day and occassionally has some koolaid or dr. Malachi Bonds Exercise: no routine    USPSTF grade A and B recommendations    Office Visit from 03/29/2019 in [facility]  AUDIT-C Score  0     Depression: Phq 9 is  negative Depression screen [facility] 2/9 03/29/2019 02/16/2019 12/08/2018 06/06/2018 02/22/2018  Decreased Interest 0 0 0 0 0  Down, Depressed, Hopeless 0 0 0 0 0  PHQ - 2 Score 0 0 0 0 0  Altered sleeping 0 0 0 - -  Tired, decreased energy 0 0 0 - -  Change in appetite 0 0 0 - -  Feeling bad or failure about yourself  0 0 0 - -  Trouble concentrating 0 0 0 - -  Moving slowly or fidgety/restless 0 0 0 - -  Suicidal thoughts 0 0 0 - -  PHQ-9 Score 0 0 0 - -  Difficult doing work/chores Not difficult at all Not difficult at all Not difficult at all - -   Hypertension: BP Readings from Last 3 Encounters:  03/29/19 132/70  02/16/19 140/82  12/08/18 137/74   Obesity: Wt Readings from Last 3 Encounters:  03/29/19 133 lb 4.8 oz (60.5 kg)  02/16/19 127 lb (57.6 kg)  07/06/18 127 lb (57.6 kg)   BMI Readings from Last 3 Encounters:  03/29/19 21.52 kg/m  02/16/19 20.50 kg/m  07/06/18 20.50 kg/m    Hep C Screening: ordered STD testing and prevention (HIV/chl/gon/syphilis): declined; will order screening HIV  Intimate partner violence: denies  Sexual History/Pain during Intercourse: denies  Menstrual History/LMP/Abnormal Bleeding: denies   Advanced Care Planning: A voluntary discussion about advance care  planning including the explanation and discussion of advance directives.  Discussed health care proxy and Living will, and the patient was able to identify a health care proxy as husband- [name].  Patient does not have a living will at present time. If patient does have living will, I have requested they bring this to the clinic to be scanned in to their chart.  Breast cancer:  HM Mammogram  Date Value Ref Range Status  06/16/2013 from [facility]  Final    Cervical cancer screening: due 2022  Osteoporosis Screening: will order  No results found for: HMDEXASCAN  Lipids:  Lab Results  Component Value Date   CHOL 124 02/22/2018   CHOL 129 07/08/2017   CHOL 132 06/30/2016   Lab Results  Component Value Date   HDL 41 (L) 02/22/2018   HDL 46 (L) 07/08/2017   HDL 38 (L) 06/30/2016   Lab Results  Component Value Date   LDLCALC 66 02/22/2018   LDLCALC 62 07/08/2017   LDLCALC 67 06/30/2016   Lab Results  Component Value Date   TRIG 89 02/22/2018   TRIG 129 07/08/2017   TRIG 134 06/30/2016   Lab Results  Component Value Date   CHOLHDL 3.0 02/22/2018   CHOLHDL 2.8 07/08/2017   CHOLHDL 3.5 06/30/2016  No results found for: LDLDIRECT  Glucose:  Glucose  Date Value Ref Range Status  01/21/2012 117 (H) 65 - 99 mg/dL Final   Glucose, Bld  Date Value Ref Range Status  02/22/2018 73 65 - 139 mg/dL Final    Comment:    .        Non-fasting reference interval .   10/28/2017 106 (H) 65 - 99 mg/dL Final  10/27/2017 98 65 - 99 mg/dL Final    Skin cancer: discussed- has appointment with derm to get growth on left knee.  Colorectal cancer: due 2021 Lung cancer:  Low Dose CT Chest recommended if Age 61-80 years, 30 pack-year currently smoking OR have quit w/in 15years. Patient does not qualify.    Patient Active Problem List   Diagnosis Date Noted  . Coronary artery disease 02/16/2019  . History of colonic polyps   . Benign neoplasm of cecum   . Benign neoplasm of transverse  colon   . Preventative health care 03/14/2018  . Polyp of sigmoid colon   . External hemorrhoids   . Intestinal lump   . Rectal inflammation   . Hip arthritis 10/27/2017  . Hypokalemia 10/18/2017  . Family history of diabetes mellitus 09/27/2017  . Breast lump on left side at 8 o'clock position 07/08/2017  . Hip pain 12/04/2016  . Swelling of inguinal region 11/13/2016  . Neoplasm of uncertain behavior of skin 11/14/2015  . Breast lump on right side at 7 o'clock position 11/14/2015  . Special screening for malignant neoplasms, colon 11/14/2015  . Medication monitoring encounter 10/17/2015  . Encounter for monitoring Coumadin therapy 02/27/2015  . PAD (peripheral artery disease) (Early)   . Hypertension   . Hyperlipidemia   . Tobacco abuse   . Hyperuricemia     Past Surgical History:  Procedure Laterality Date  . ANGIOPLASTY  July 2011   PAD- popliteal artery  . COLONOSCOPY WITH PROPOFOL N/A 01/13/2018   Procedure: COLONOSCOPY WITH PROPOFOL;  Surgeon: Lin Landsman, MD;  Location[facility] ENDOSCOPY;  Service: Gastroenterology;  Laterality: N/A;  . COLONOSCOPY WITH PROPOFOL N/A 01/14/2018   Procedure: COLONOSCOPY WITH PROPOFOL;  Surgeon: Virgel Manifold, MD;  Location: [facility] ENDOSCOPY;  Service: Endoscopy;  Laterality: N/A;  . COLONOSCOPY WITH PROPOFOL N/A 07/06/2018   Procedure: COLONOSCOPY WITH PROPOFOL;  Surgeon: Virgel Manifold, MD;  Location: [facility] ENDOSCOPY;  Service: Endoscopy;  Laterality: N/A;  . CORONARY ANGIOPLASTY    . JOINT REPLACEMENT    . Leg Bypass Surgery Right 08/2010  . OVARIAN CYST SURGERY  1980s  . POPLITEAL ARTERY STENT Right 05/2010  . POPLITEAL ARTERY STENT Right 07/2010  . TOTAL HIP ARTHROPLASTY Left 10/27/2017   Procedure: TOTAL HIP ARTHROPLASTY;  Surgeon: Earnestine Leys, MD;  Location: [facility] ORS;  Service: Orthopedics;  Laterality: Left;    Family History  Problem Relation Age of Onset  . Arthritis Mother   . Diabetes Mother   . Stroke Mother    . Hypertension Mother   . Dementia Mother   . Cancer Father   . Arthritis Sister   . Seizures Sister   . Diabetes Sister   . Hypertension Sister   . COPD Sister   . Cancer Brother   . Stroke Maternal Grandmother   . Diabetes Paternal Grandmother   . Cancer Sister   . Diabetes Sister   . Arthritis Sister   . Diabetes Brother   . Stroke Brother   . Healthy Brother   . Diabetes Brother   .  Stroke Brother   . Cancer Brother   . Heart disease Neg Hx   . Breast cancer Neg Hx     Social History   Socioeconomic History  . Marital status: Married    Spouse name: Joien  . Number of children: 0  . Years of education: Not on file  . Highest education level: 12th grade  Occupational History  . Occupation: Disabled  Social Needs  . Financial resource strain: Not very hard  . Food insecurity    Worry: Never true    Inability: Never true  . Transportation needs    Medical: No    Non-medical: No  Tobacco Use  . Smoking status: Current Every Day Smoker    Packs/day: 0.25    Years: 40.00    Pack years: 10.00    Types: Cigarettes  . Smokeless tobacco: Never Used  . Tobacco comment: 7 a day on average  Substance and Sexual Activity  . Alcohol use: No  . Drug use: No  . Sexual activity: Yes  Lifestyle  . Physical activity    Days per week: 7 days    Minutes per session: 20 min  . Stress: Not at all  Relationships  . Social connections    Talks on phone: More than three times a week    Gets together: Twice a week    Attends religious service: Never    Active member of club or organization: No    Attends meetings of clubs or organizations: Never    Relationship status: Married  . Intimate partner violence    Fear of current or ex partner: No    Emotionally abused: No    Physically abused: No    Forced sexual activity: No  Other Topics Concern  . Not on file  Social History Narrative  . Not on file     Current Outpatient Medications:  .  acetaminophen  (TYLENOL) 500 MG tablet, Take 500 mg by mouth 2 (two) times daily as needed for moderate pain or headache., Disp: , Rfl:  .  amLODipine (NORVASC) 5 MG tablet, Take 1 tablet by mouth once daily, Disp: 90 tablet, Rfl: 0 .  atorvastatin (LIPITOR) 10 MG tablet, TAKE 1 TABLET BY MOUTH ONCE DAILY, Disp: 90 tablet, Rfl: 2 .  cholecalciferol (VITAMIN D3) 25 MCG (1000 UT) tablet, Take 1,000 Units by mouth every other day., Disp: , Rfl:  .  vitamin E 400 UNIT capsule, Take 400 Units by mouth daily., Disp: , Rfl:  .  warfarin (COUMADIN) 5 MG tablet, TAKE AS DIRECTED, Disp: 90 tablet, Rfl: 2 .  Blood Pressure Monitoring (BLOOD PRESSURE KIT) DEVI, 1 each by Does not apply route 2 (two) times daily. Fluctuating blood pressures, DX I10, Disp: 1 Device, Rfl: 0 .  ferrous sulfate 325 (65 FE) MG tablet, Take 325 mg by mouth daily., Disp: , Rfl:  .  gabapentin (NEURONTIN) 300 MG capsule, TAKE ONE CAPSULE BY MOUTH TWICE DAILY (Patient taking differently: Take 300 mgs by mouth once daily), Disp: 60 capsule, Rfl: 11 .  mirtazapine (REMERON) 7.5 MG tablet, Take 1 tablet (7.5 mg total) by mouth at bedtime. (Patient not taking: Reported on 03/29/2019), Disp: 90 tablet, Rfl: 3 .  potassium chloride (KLOR-CON 10) 10 MEQ tablet, Take 1 tablet (10 mEq total) by mouth 2 (two) times daily. (Patient not taking: Reported on 03/29/2019), Disp: 8 tablet, Rfl: 0  No Known Allergies   Review of Systems  Constitutional: Negative for chills, fever and malaise/fatigue.  HENT: Negative for congestion, sinus pain and sore throat.   Eyes: Negative for blurred vision.  Respiratory: Negative for cough and shortness of breath.   Cardiovascular: Negative for chest pain, palpitations and leg swelling.  Gastrointestinal: Negative for abdominal pain, constipation, diarrhea and nausea.  Genitourinary: Negative for dysuria.  Musculoskeletal: Negative for falls and joint pain.  Skin: Negative for rash.  Neurological: Negative for dizziness,  weakness and headaches.  Endo/Heme/Allergies: Negative for polydipsia.  Psychiatric/Behavioral: The patient is not nervous/anxious and does not have insomnia.      Objective  Vitals:   03/29/19 1409  BP: 132/70  Pulse: 88  Resp: 14  Temp: (!) 96.6 F (35.9 C)  TempSrc: Temporal  SpO2: 95%  Weight: 133 lb 4.8 oz (60.5 kg)  Height: 5' 6" (1.676 m)    Body mass index is 21.52 kg/m.  Physical Exam Constitutional: Patient appears well-developed and well-nourished. No distress.  HENT: Head: Normocephalic and atraumatic. Ears: B TMs ok, no erythema or effusion;  Eyes: Conjunctivae and EOM are normal. Pupils are equal, round, and reactive to light. No scleral icterus.  Neck: Normal range of motion. Neck supple. No JVD present. No thyromegaly present.  Cardiovascular: Normal rate, regular rhythm and normal heart sounds.  No murmur heard. No BLE edema. Pulmonary/Chest: Effort normal and breath sounds normal. No respiratory distress. Abdominal: Soft. Bowel sounds are normal, no distension. There is no tenderness. no masses Breast: no lumps or masses, no nipple discharge or rashes FEMALE GENITALIA: deferred  Musculoskeletal: Normal range of motion, no joint effusions. No gross deformities Neurological: he is alert and oriented to person, place, and time. No cranial nerve deficit. Coordination, balance, strength, speech and gait are normal.  Skin: Skin is warm and dry. No rash noted. No erythema.  Psychiatric: Patient has a normal mood and affect. behavior is normal. Judgment and thought content normal.    No results found for this or any previous visit (from the past 2160 hour(s)).   Fall Risk: Fall Risk  03/29/2019 02/16/2019 12/08/2018 06/06/2018 02/22/2018  Falls in the past year? 0 0 0 No No  Number falls in past yr: 0 0 - - -  Injury with Fall? 0 0 - - -  Follow up - Falls prevention discussed - - -     Functional Status Survey: Is the patient deaf or have difficulty  hearing?: No Does the patient have difficulty seeing, even when wearing glasses/contacts?: No Does the patient have difficulty concentrating, remembering, or making decisions?: No Does the patient have difficulty walking or climbing stairs?: No Does the patient have difficulty dressing or bathing?: No Does the patient have difficulty doing errands alone such as visiting a doctor's office or shopping?: No   Assessment & Plan 1. Preventative health care - COMPLETE METABOLIC PANEL WITH GFR - Lipid Profile - Hepatitis C Antibody - HIV antibody (with reflex)  2. Screening for breast cancer - MM Digital Screening; Future  3. Screening for osteoporosis - DG Bone Density; Future  4. Mixed hyperlipidemia - Lipid Profile  5. Encounter for hepatitis C screening test for low risk patient - Hepatitis C Antibody  6. Encounter for screening for HIV - HIV antibody (with reflex)  -USPSTF grade A and B recommendations reviewed with patient; age-appropriate recommendations, preventive care, screening tests, etc discussed and encouraged; healthy living encouraged; see AVS for patient education given to patient -Discussed importance of 150 minutes of physical activity weekly, eat two servings of fish weekly, eat one serving of  tree nuts ( cashews, pistachios, pecans, almonds.Marland Kitchen) every other day, eat 6 servings of fruit/vegetables daily and drink plenty of water and avoid sweet beverages.   -Reviewed Health Maintenance: labs ordered

## 2019-03-30 LAB — LIPID PANEL
Cholesterol: 141 mg/dL (ref <200)
HDL: 38 mg/dL — ABNORMAL LOW (ref >=50)
LDL Cholesterol (Calc): 70 mg/dL (calc)
Non-HDL Cholesterol (Calc): 103 mg/dL (calc) (ref <130)
Total CHOL/HDL Ratio: 3.7 (calc) (ref <5.0)
Triglycerides: 248 mg/dL — ABNORMAL HIGH (ref <150)

## 2019-03-30 LAB — COMPLETE METABOLIC PANEL WITH GFR
AG Ratio: 1.5 (calc) (ref 1.0–2.5)
ALT: 9 U/L (ref 6–29)
AST: 19 U/L (ref 10–35)
Albumin: 4.2 g/dL (ref 3.6–5.1)
Alkaline phosphatase (APISO): 75 U/L (ref 37–153)
BUN: 9 mg/dL (ref 7–25)
CO2: 24 mmol/L (ref 20–32)
Calcium: 9.4 mg/dL (ref 8.6–10.4)
Chloride: 103 mmol/L (ref 98–110)
Creat: 0.77 mg/dL (ref 0.50–0.99)
GFR, Est African American: 94 mL/min/{1.73_m2} (ref >=60)
GFR, Est Non African American: 81 mL/min/{1.73_m2} (ref >=60)
Globulin: 2.8 g/dL (calc) (ref 1.9–3.7)
Glucose, Bld: 83 mg/dL (ref 65–99)
Potassium: 3.7 mmol/L (ref 3.5–5.3)
Sodium: 140 mmol/L (ref 135–146)
Total Bilirubin: 0.3 mg/dL (ref 0.2–1.2)
Total Protein: 7 g/dL (ref 6.1–8.1)

## 2019-03-30 LAB — HEPATITIS C ANTIBODY
Hepatitis C Ab: NONREACTIVE
SIGNAL TO CUT-OFF: 0.11 (ref <1.00)

## 2019-03-30 LAB — HIV ANTIBODY (ROUTINE TESTING W REFLEX): HIV 1&2 Ab, 4th Generation: NONREACTIVE

## 2019-04-11 ENCOUNTER — Other Ambulatory Visit (INDEPENDENT_AMBULATORY_CARE_PROVIDER_SITE_OTHER): Payer: Self-pay | Admitting: Vascular Surgery

## 2019-04-13 ENCOUNTER — Other Ambulatory Visit: Payer: Self-pay | Admitting: Family Medicine

## 2019-04-13 ENCOUNTER — Other Ambulatory Visit (INDEPENDENT_AMBULATORY_CARE_PROVIDER_SITE_OTHER): Payer: Self-pay | Admitting: Vascular Surgery

## 2019-04-18 ENCOUNTER — Telehealth (INDEPENDENT_AMBULATORY_CARE_PROVIDER_SITE_OTHER): Payer: Self-pay | Admitting: Vascular Surgery

## 2019-04-18 NOTE — Telephone Encounter (Signed)
Patient calling today requesting refill of Atorvastatin. Patient has not been seen in our office since 2017. Please advise. AS, CMA

## 2019-04-18 NOTE — Telephone Encounter (Signed)
She needs to speak with PCP

## 2019-04-18 NOTE — Telephone Encounter (Signed)
I have attempted to contact the patient 3 times and the line continues to be busy. AS, CMA

## 2019-04-19 ENCOUNTER — Other Ambulatory Visit: Payer: Self-pay | Admitting: Family Medicine

## 2019-04-19 MED ORDER — ATORVASTATIN CALCIUM 10 MG PO TABS: 10.0000 mg | ORAL_TABLET | Freq: Every day | ORAL | 3 refills | Status: DC

## 2019-04-19 NOTE — Telephone Encounter (Signed)
Pt is needing a refill on  atorvastation 10 mg. She called Dr Lucky Cowboy office ([facility]) first and they told her to contact her pcp office and if you have any questions to contact their office [phone]. Please send to [facility]

## 2019-04-19 NOTE — Telephone Encounter (Signed)
Patient is aware of the below and verbalized understanding. AS, CMA 

## 2019-05-01 ENCOUNTER — Other Ambulatory Visit (INDEPENDENT_AMBULATORY_CARE_PROVIDER_SITE_OTHER): Payer: Self-pay | Admitting: Vascular Surgery

## 2019-05-01 ENCOUNTER — Telehealth (INDEPENDENT_AMBULATORY_CARE_PROVIDER_SITE_OTHER): Payer: Self-pay | Admitting: Nurse Practitioner

## 2019-05-01 NOTE — Telephone Encounter (Signed)
We have not seen the patient since 07/2017, she would either need to speak with her PCP or have an office visit.

## 2019-05-01 NOTE — Telephone Encounter (Signed)
Patient requesting refill of Gabapentin. AS, CMA

## 2019-05-01 NOTE — Telephone Encounter (Signed)
Patient has been made aware of the below and stated she would contact her PCP. AS, CMA

## 2019-05-10 ENCOUNTER — Telehealth: Payer: Self-pay | Admitting: Family Medicine

## 2019-05-10 NOTE — Chronic Care Management (AMB) (Signed)
Chronic Care Management   Note  05/10/2019 Name: Mary [name] MRN: [id number] DOB: 1951/03/29  [name] is a 68 y.o. year old female who is a primary care patient of Lada, Satira Anis, MD. I reached out to [name] by phone today in response to a referral sent by [name] health plan.    [name] was given information about Chronic Care Management services today including:  1. CCM service includes personalized support from designated clinical staff supervised by her physician, including individualized plan of care and coordination with other care providers 2. 24/7 contact phone numbers for assistance for urgent and routine care needs. 3. Service will only be billed when office clinical staff spend 20 minutes or more in a month to coordinate care. 4. Only one practitioner may furnish and bill the service in a calendar month. 5. The patient may stop CCM services at any time (effective at the end of the month) by phone call to the office staff. 6. The patient will be responsible for cost sharing (co-pay) of up to 20% of the service fee (after annual deductible is met).  Patient did not agree to enrollment in care management services and does not wish to consider at this time.  Follow up plan: The patient has been provided with contact information for the chronic care management team and has been advised to call with any health related questions or concerns.   [place]  [email or web address]   ??[phone]

## 2019-05-10 NOTE — Chronic Care Management (AMB) (Signed)
°  Chronic Care Management   Outreach Note  05/10/2019 Name: Mary [name] MRN: [id number] DOB: 1950-09-22  Referred by: Arnetha Courser, MD Reason for referral : Chronic Care Management ([facility] CCM outreach was unsuccessful )   An unsuccessful telephone outreach was attempted today. The patient was referred to the case management team by for assistance with chronic care management and care coordination.   Follow Up Plan: A HIPPA compliant phone message was left for the patient providing contact information and requesting a return call.  The care management team will reach out to the patient again over the next 7 days.  If patient returns call to provider office, please advise to call [place] [facility] [place]  [email or web address]   ??[phone]

## 2019-05-17 ENCOUNTER — Other Ambulatory Visit: Payer: Self-pay | Admitting: Nurse Practitioner

## 2019-06-01 ENCOUNTER — Other Ambulatory Visit: Payer: Self-pay | Admitting: Family Medicine

## 2019-06-01 NOTE — Telephone Encounter (Signed)
Requested medication (s) are due for refill today: yes  Requested medication (s) are on the active medication list: yes  Last refill:  03/06/2019  Future visit scheduled: yes  Notes to clinic:  Review for refill   Requested Prescriptions  Pending Prescriptions Disp Refills   amLODipine (NORVASC) 5 MG tablet [Pharmacy Med Name: amLODIPine Besylate 5 MG Oral Tablet] 90 tablet 0    Sig: Take 1 tablet by mouth once daily     Cardiovascular:  Calcium Channel Blockers Passed - 06/01/2019  8:28 AM      Passed - Last BP in normal range    BP Readings from Last 1 Encounters:  03/29/19 132/70         Passed - Valid encounter within last 6 months    Recent Outpatient Visits          2 months ago Preventative health care   [place], NP   5 months ago PAD (peripheral artery disease) [facility])   [place], Satira Anis, MD   12 months ago Essential hypertension   [place], Satira Anis, MD   1 year ago Preventative health care   [facility] [place], Satira Anis, MD   1 year ago Abnormal weight loss   [place], Satira Anis, MD      Future Appointments            In 3 weeks [facility], [facility], [place]   In 8 months  [facility], [place]   In 10 months [facility] [place], Astrid Divine, [facility], [facility]

## 2019-06-23 ENCOUNTER — Ambulatory Visit (INDEPENDENT_AMBULATORY_CARE_PROVIDER_SITE_OTHER): Payer: Medicare HMO | Admitting: Family Medicine

## 2019-06-23 ENCOUNTER — Other Ambulatory Visit: Payer: Self-pay

## 2019-06-23 ENCOUNTER — Encounter: Payer: Self-pay | Admitting: Family Medicine

## 2019-06-23 VITALS — BP 130/84 | HR 98 | Temp 97.9°F | Resp 14 | Ht 66.0 in | Wt 132.3 lb

## 2019-06-23 DIAGNOSIS — I10 Essential (primary) hypertension: Secondary | ICD-10-CM | POA: Diagnosis not present

## 2019-06-23 DIAGNOSIS — Z8739 Personal history of other diseases of the musculoskeletal system and connective tissue: Secondary | ICD-10-CM | POA: Insufficient documentation

## 2019-06-23 DIAGNOSIS — E782 Mixed hyperlipidemia: Secondary | ICD-10-CM

## 2019-06-23 DIAGNOSIS — Z720 Tobacco use: Secondary | ICD-10-CM | POA: Diagnosis not present

## 2019-06-23 DIAGNOSIS — I739 Peripheral vascular disease, unspecified: Secondary | ICD-10-CM | POA: Diagnosis not present

## 2019-06-23 DIAGNOSIS — Z7901 Long term (current) use of anticoagulants: Secondary | ICD-10-CM

## 2019-06-23 DIAGNOSIS — I2510 Atherosclerotic heart disease of native coronary artery without angina pectoris: Secondary | ICD-10-CM | POA: Diagnosis not present

## 2019-06-23 NOTE — Progress Notes (Signed)
Name: ARDIE DRAGOO   MRN: [id number]    DOB: 1950-09-16   Date:06/23/2019       Progress Note  Subjective  Chief Complaint  Chief Complaint  Patient presents with  . Hyperlipidemia    follow up  . Hypertension    HPI  Hypertension: Checking BP's at home, doing well on this; no chest pain, shortness of breath, BLE edema.  She is compliant with her medications - taking amlodipine.   PAD: managed by Dr. Lucky Cowboy; on coumadin/warfarin; no easy bruising or bleeding; toes and feet do not turn purple or go cold.  She feels good right now and has no concerns. Takes gabapentin PRN.   Gout: No recent flares. Does not drink beer, shellfish.  No concerns; not on preventive.  High cholesterol: Tolerating atorvastatin, denies chest pain, shortness of breath, myalgias.    Tobacco abuse: Still smoking about 1/2ppd; discussed cessation, not ready to quit yet. Has been smoking for about 30 years at about 1/2ppd.  Denies cough or shortness of breath.    Patient Active Problem List   Diagnosis Date Noted  . Coronary artery disease 02/16/2019  . Benign neoplasm of cecum   . Benign neoplasm of transverse colon   . Polyp of sigmoid colon   . External hemorrhoids   . Hip arthritis 10/27/2017  . Breast lump on left side at 8 o'clock position 07/08/2017  . Breast lump on right side at 7 o'clock position 11/14/2015  . Encounter for monitoring Coumadin therapy 02/27/2015  . PAD (peripheral artery disease) ([place])   . Hypertension   . Hyperlipidemia   . Tobacco abuse   . Hyperuricemia     Past Surgical History:  Procedure Laterality Date  . ANGIOPLASTY  July 2011   PAD- popliteal artery  . COLONOSCOPY WITH PROPOFOL N/A 01/13/2018   Procedure: COLONOSCOPY WITH PROPOFOL;  Surgeon: Lin Landsman, MD;  Location: [facility] ENDOSCOPY;  Service: Gastroenterology;  Laterality: N/A;  . COLONOSCOPY WITH PROPOFOL N/A 01/14/2018   Procedure: COLONOSCOPY WITH PROPOFOL;  Surgeon: Virgel Manifold, MD;   Location: [facility] ENDOSCOPY;  Service: Endoscopy;  Laterality: N/A;  . COLONOSCOPY WITH PROPOFOL N/A 07/06/2018   Procedure: COLONOSCOPY WITH PROPOFOL;  Surgeon: Virgel Manifold, MD;  Location: [facility] ENDOSCOPY;  Service: Endoscopy;  Laterality: N/A;  . CORONARY ANGIOPLASTY    . JOINT REPLACEMENT    . Leg Bypass Surgery Right 08/2010  . OVARIAN CYST SURGERY  1980s  . POPLITEAL ARTERY STENT Right 05/2010  . POPLITEAL ARTERY STENT Right 07/2010  . TOTAL HIP ARTHROPLASTY Left 10/27/2017   Procedure: TOTAL HIP ARTHROPLASTY;  Surgeon: Earnestine Leys, MD;  Location: [facility] ORS;  Service: Orthopedics;  Laterality: Left;    Family History  Problem Relation Age of Onset  . Arthritis Mother   . Diabetes Mother   . Stroke Mother   . Hypertension Mother   . Dementia Mother   . Cancer Father   . Arthritis Sister   . Seizures Sister   . Diabetes Sister   . Hypertension Sister   . COPD Sister   . Cancer Brother   . Stroke Maternal Grandmother   . Diabetes Paternal Grandmother   . Cancer Sister   . Diabetes Sister   . Arthritis Sister   . Diabetes Brother   . Stroke Brother   . Healthy Brother   . Diabetes Brother   . Stroke Brother   . Cancer Brother   . Heart disease Neg Hx   .  Breast cancer Neg Hx     Social History   Socioeconomic History  . Marital status: Married    Spouse name: Joien  . Number of children: 0  . Years of education: Not on file  . Highest education level: 12th grade  Occupational History  . Occupation: Disabled  Social Needs  . Financial resource strain: Not very hard  . Food insecurity    Worry: Never true    Inability: Never true  . Transportation needs    Medical: No    Non-medical: No  Tobacco Use  . Smoking status: Current Every Day Smoker    Packs/day: 0.25    Years: 40.00    Pack years: 10.00    Types: Cigarettes  . Smokeless tobacco: Never Used  . Tobacco comment: 7 a day on average  Substance and Sexual Activity  . Alcohol use: No  . Drug  use: No  . Sexual activity: Yes  Lifestyle  . Physical activity    Days per week: 7 days    Minutes per session: 20 min  . Stress: Not at all  Relationships  . Social connections    Talks on phone: More than three times a week    Gets together: Twice a week    Attends religious service: Never    Active member of club or organization: No    Attends meetings of clubs or organizations: Never    Relationship status: Married  . Intimate partner violence    Fear of current or ex partner: No    Emotionally abused: No    Physically abused: No    Forced sexual activity: No  Other Topics Concern  . Not on file  Social History Narrative  . Not on file     Current Outpatient Medications:  .  acetaminophen (TYLENOL) 500 MG tablet, Take 500 mg by mouth 2 (two) times daily as needed for moderate pain or headache., Disp: , Rfl:  .  amLODipine (NORVASC) 5 MG tablet, Take 1 tablet by mouth once daily, Disp: 90 tablet, Rfl: 3 .  atorvastatin (LIPITOR) 10 MG tablet, Take 1 tablet (10 mg total) by mouth daily., Disp: 90 tablet, Rfl: 3 .  Blood Pressure Monitoring (BLOOD PRESSURE KIT) DEVI, 1 each by Does not apply route 2 (two) times daily. Fluctuating blood pressures, DX I10, Disp: 1 Device, Rfl: 0 .  cholecalciferol (VITAMIN D3) 25 MCG (1000 UT) tablet, Take 1,000 Units by mouth every other day., Disp: , Rfl:  .  ferrous sulfate 325 (65 FE) MG tablet, Take 325 mg by mouth daily., Disp: , Rfl:  .  gabapentin (NEURONTIN) 300 MG capsule, TAKE ONE CAPSULE BY MOUTH TWICE DAILY (Patient taking differently: Take 300 mgs by mouth once daily), Disp: 60 capsule, Rfl: 11 .  potassium chloride (KLOR-CON 10) 10 MEQ tablet, Take 1 tablet (10 mEq total) by mouth 2 (two) times daily., Disp: 8 tablet, Rfl: 0 .  vitamin E 400 UNIT capsule, Take 400 Units by mouth daily., Disp: , Rfl:  .  warfarin (COUMADIN) 5 MG tablet, TAKE AS DIRECTED, Disp: 90 tablet, Rfl: 2 .  mirtazapine (REMERON) 7.5 MG tablet, Take 1 tablet  (7.5 mg total) by mouth at bedtime. (Patient not taking: Reported on 03/29/2019), Disp: 90 tablet, Rfl: 3  No Known Allergies  I personally reviewed active problem list, medication list, allergies, health maintenance, notes from last encounter, lab results with the patient/caregiver today.   ROS  Constitutional: Negative for fever or weight change.  Respiratory: Negative for cough and shortness of breath.   Cardiovascular: Negative for chest pain or palpitations.  Gastrointestinal: Negative for abdominal pain, no bowel changes.  Musculoskeletal: Negative for gait problem or joint swelling.  Skin: Negative for rash.  Neurological: Negative for dizziness or headache.  No other specific complaints in a complete review of systems (except as listed in HPI above).  Objective  Vitals:   06/23/19 1119  BP: 130/84  Pulse: 98  Resp: 14  Temp: 97.9 F (36.6 C)  TempSrc: Oral  SpO2: 97%  Weight: 132 lb 4.8 oz (60 kg)  Height: 5' 6" (1.676 m)   Body mass index is 21.35 kg/m.  Physical Exam  Constitutional: Patient appears well-developed and well-nourished. No distress.  HENT: Head: Normocephalic and atraumatic.  Eyes: Conjunctivae and EOM are normal. No scleral icterus.   Neck: Normal range of motion. Neck supple. No JVD present.  Cardiovascular: Normal rate, regular rhythm and normal heart sounds.  No murmur heard. No BLE edema. Pulmonary/Chest: Effort normal and breath sounds normal. No respiratory distress. Musculoskeletal: Normal range of motion, no joint effusions. No gross deformities Neurological: Pt is alert and oriented to person, place, and time. No cranial nerve deficit. Coordination, balance, strength, speech and gait are normal.  Skin: Skin is warm and dry. No rash noted. No erythema.  Psychiatric: Patient has a normal mood and affect. behavior is normal. Judgment and thought content normal.  No results found for this or any previous visit (from the past 72 hour(s)).   PHQ2/9: Depression screen [facility] 2/9 06/23/2019 03/29/2019 02/16/2019 12/08/2018 06/06/2018  Decreased Interest 0 0 0 0 0  Down, Depressed, Hopeless 0 0 0 0 0  PHQ - 2 Score 0 0 0 0 0  Altered sleeping 0 0 0 0 -  Tired, decreased energy 0 0 0 0 -  Change in appetite 0 0 0 0 -  Feeling bad or failure about yourself  0 0 0 0 -  Trouble concentrating 0 0 0 0 -  Moving slowly or fidgety/restless 0 0 0 0 -  Suicidal thoughts 0 0 0 0 -  PHQ-9 Score 0 0 0 0 -  Difficult doing work/chores Not difficult at all Not difficult at all Not difficult at all Not difficult at all -   PHQ-2/9 Result is negative.    Fall Risk: Fall Risk  06/23/2019 03/29/2019 02/16/2019 12/08/2018 06/06/2018  Falls in the past year? 0 0 0 0 No  Number falls in past yr: 0 0 0 - -  Injury with Fall? 0 0 0 - -  Follow up Falls evaluation completed - Falls prevention discussed - -    Assessment & Plan  1. Essential hypertension - Stable and doing well on current regimen; DASH diet discussed in detail.  2. PAD (peripheral artery disease) ([facility]) - Seeing Dr. Lucky Cowboy; doing well, pain is mostly controlled with PRN gabapentin  3. Anticoagulated on Coumadin - Managed by Dr. Lucky Cowboy  4. Coronary artery disease involving native coronary artery of native heart without angina pectoris - Taking statin and coumadin.  5. Mixed hyperlipidemia - Taking statin therapy - Last lipids were at goal in July 2020  6. Tobacco abuse - Discussed cessation, not ready at this time  7. History of gout - Stable without preventive therapy.

## 2019-06-26 ENCOUNTER — Ambulatory Visit
Admission: RE | Admit: 2019-06-26 | Discharge: 2019-06-26 | Disposition: A | Discharge status: Home / Self Care | Payer: Medicare HMO | Source: Ambulatory Visit | Attending: Family Medicine | Admitting: Family Medicine

## 2019-06-26 DIAGNOSIS — Z1231 Encounter for screening mammogram for malignant neoplasm of breast: Secondary | ICD-10-CM | POA: Diagnosis not present

## 2019-08-31 ENCOUNTER — Other Ambulatory Visit (INDEPENDENT_AMBULATORY_CARE_PROVIDER_SITE_OTHER): Payer: Self-pay | Admitting: Vascular Surgery

## 2019-09-07 ENCOUNTER — Telehealth: Payer: Self-pay

## 2019-09-07 ENCOUNTER — Other Ambulatory Visit: Payer: Self-pay | Admitting: Family Medicine

## 2019-09-07 NOTE — Telephone Encounter (Signed)
Medication Refill - Medication: warfarin   Has the patient contacted their pharmacy? No.  (Agent: If no, request that the patient contact the pharmacy for the refill.) (Agent: If yes, when and what did the pharmacy advise?)  Preferred Pharmacy (with phone number or street name):  [place], [place] - [address]  [place] [address]  Phone: [phone] Fax: [phone]  Not a 24 hour pharmacy; exact hours not known.     Agent: Please be advised that RX refills may take up to 3 business days. We ask that you follow-up with your pharmacy.

## 2019-09-07 NOTE — Telephone Encounter (Signed)
Requested medication (s) are due for refill today: yes  Requested medication (s) are on the active medication list: yes   Future visit scheduled: yes  Notes to clinic: This refill cannot be delegated   Requested Prescriptions  Pending Prescriptions Disp Refills   warfarin (COUMADIN) 5 MG tablet 90 tablet 2    Sig: TAKE AS DIRECTED      Hematology:  Anticoagulants - warfarin Failed - 09/07/2019 11:21 AM      Failed - This refill cannot be delegated      Failed - If the patient is managed by Coumadin Clinic - route to their Pool. If not, forward to the provider.      Failed - INR in normal range and within 30 days    INR  Date Value Ref Range Status  10/31/2017 1.77  Final    Comment:    Performed at [facility], [address] [place], [place] [address]  10/26/2011 2.0  Final    Comment:    INR reference interval applies to patients on anticoagulant therapy. A single INR therapeutic range for coumarins is not optimal for all indications; however, the suggested range for most indications is 2.0 - 3.0. Exceptions to the INR Reference Range may include: Prosthetic heart valves, acute myocardial infarction, prevention of myocardial infarction, and combinations of aspirin and anticoagulant. The need for a higher or lower target INR must be assessed individually. Reference: The Pharmacology and Management of the Vitamin K  antagonists: the seventh ACCP Conference on Antithrombotic and Thrombolytic Therapy. [id number] Sept:126 (3suppl): [id number]. A HCT value >55% may artifactually increase the PT.  In one study,  the increase was an average of 25%. Reference:  "Effect on Routine and Special Coagulation Testing Values of Citrate Anticoagulant Adjustment in Patients with High HCT Values." American Journal of Clinical Pathology 2006;126:400-405.           Passed - Valid encounter within last 3 months    Recent Outpatient Visits           2 months ago Essential  hypertension   [place], FNP   5 months ago Preventative health care   [place], NP   9 months ago PAD (peripheral artery disease) [facility])   [place], Satira Anis, MD   1 year ago Essential hypertension   [place], Satira Anis, MD   1 year ago Preventative health care   [place], MD       Future Appointments             In 4 months [facility], [facility], [place]   In 5 months  [facility], [place]   In 6 months Uvaldo Rising, Astrid Divine, [facility], [facility]

## 2019-09-08 NOTE — Telephone Encounter (Signed)
Per my last notes in October, Coumadin is managed by Dr. Lucky Cowboy.  She will need follow up and refills from him.

## 2019-09-11 ENCOUNTER — Other Ambulatory Visit (INDEPENDENT_AMBULATORY_CARE_PROVIDER_SITE_OTHER): Payer: Self-pay | Admitting: Vascular Surgery

## 2019-09-11 NOTE — Telephone Encounter (Signed)
Pt called stating that her vein and vascular doctor is telling her to contact pcp to get this filled. Pt is requesting a call back. Please advise.

## 2019-09-12 NOTE — Telephone Encounter (Signed)
Left message for patient to call office regarding refills on Coumadin

## 2019-09-13 ENCOUNTER — Telehealth: Payer: Self-pay | Admitting: Emergency Medicine

## 2019-09-13 NOTE — Telephone Encounter (Signed)
Patient called and asked for refill on Coumadin. Per previous note she was told to see Dr. Lucky Cowboy for follow up on coumadin and have him refill medication. Patient stated today that she had not seen Dr. Lucky Cowboy in over 2 years and her coumadin has not been checked. Patient was informed to call Dr.Dew office and schedule appointment for follow up asap. Patient also stated she woke up coughing, sneezing and not feeling well today so she could not make appointment until next week. She verbalized understanding og how important it is for her to follow up with Dr.Dew.

## 2019-09-13 NOTE — Telephone Encounter (Signed)
Agree with documentation; if she needs coumadin management from our clinic, we may provide, however she must have follow up with Dr. Lucky Cowboy first as she was new to me in October 2020 and has not had any coumadin management from our clinic in the past.

## 2019-09-13 NOTE — Telephone Encounter (Signed)
Mary [name] message regarding her coumadin and patient stating she has not seen Dr.Dew for 2 years

## 2019-09-19 NOTE — Telephone Encounter (Signed)
Called patient to make sure she make appointment with Dr.Dew and patient stated that she would

## 2019-10-17 ENCOUNTER — Telehealth (INDEPENDENT_AMBULATORY_CARE_PROVIDER_SITE_OTHER): Payer: Self-pay

## 2019-10-17 NOTE — Telephone Encounter (Signed)
Left Message for pt with NP Fallon's  recommendations please see note below.

## 2019-10-17 NOTE — Telephone Encounter (Signed)
Pt wants to know if she can take the covid vaccination due to being on blood thinners.

## 2019-10-17 NOTE — Telephone Encounter (Signed)
Yes she can, there is no contraindication

## 2019-10-19 ENCOUNTER — Ambulatory Visit (INDEPENDENT_AMBULATORY_CARE_PROVIDER_SITE_OTHER): Payer: Medicare Other | Admitting: Vascular Surgery

## 2019-10-26 ENCOUNTER — Ambulatory Visit (INDEPENDENT_AMBULATORY_CARE_PROVIDER_SITE_OTHER): Payer: Medicare HMO | Admitting: Nurse Practitioner

## 2019-10-26 ENCOUNTER — Encounter (INDEPENDENT_AMBULATORY_CARE_PROVIDER_SITE_OTHER): Payer: Self-pay | Admitting: Nurse Practitioner

## 2019-10-26 ENCOUNTER — Other Ambulatory Visit: Payer: Self-pay

## 2019-10-26 VITALS — BP 153/88 | HR 84 | Resp 16 | Ht 66.0 in | Wt 113.0 lb

## 2019-10-26 DIAGNOSIS — E782 Mixed hyperlipidemia: Secondary | ICD-10-CM | POA: Diagnosis not present

## 2019-10-26 DIAGNOSIS — I739 Peripheral vascular disease, unspecified: Secondary | ICD-10-CM | POA: Diagnosis not present

## 2019-10-26 DIAGNOSIS — Z720 Tobacco use: Secondary | ICD-10-CM

## 2019-10-26 MED ORDER — WARFARIN SODIUM 5 MG PO TABS: ORAL_TABLET | ORAL | 0 refills | Status: DC

## 2019-10-26 NOTE — Progress Notes (Signed)
SUBJECTIVE:  Patient ID: Mary [name], female    DOB: [birth year], 69 y.o.   MRN: [id number] Chief Complaint  Patient presents with  . Follow-up    est pt needs to be seen obtain refill for blood thinner    HPI  Mary [name] is a 69 y.o. female that returns her office today for follow-up regarding her anticoagulation.  The patient is a peripheral arterial disease patient, however she has not been seen in our office since June 2018.  The the patient was previously placed on Coumadin for her peripheral arterial disease however based on our records she has not had an INR check in well over a year.  However, she was eating refills done by her PCP so there may have been some INR checks there.  However, the patient does endorse is "a long time".  Currently the patient endorses having some claudication-like symptoms however she still able to ambulate regularly especially with walking her dog.  She denies any open wounds.  The patient does have a history of neuropathy so she has some numbness currently.  She denies any fever, chills, nausea, vomiting or diarrhea.  She denies any chest pain or shortness of breath.  Past Medical History:  Diagnosis Date  . Arthritis   . Hyperlipidemia   . Hypertension   . Hyperuricemia   . Osteoporosis   . PAD (peripheral artery disease) ([place]) 02/2010   angiplasty, popliteal artery  . Tobacco abuse     Past Surgical History:  Procedure Laterality Date  . ANGIOPLASTY  July 2011   PAD- popliteal artery  . COLONOSCOPY WITH PROPOFOL N/A 01/13/2018   Procedure: COLONOSCOPY WITH PROPOFOL;  Surgeon: Lin Landsman, MD;  Location: [facility] ENDOSCOPY;  Service: Gastroenterology;  Laterality: N/A;  . COLONOSCOPY WITH PROPOFOL N/A 01/14/2018   Procedure: COLONOSCOPY WITH PROPOFOL;  Surgeon: Virgel Manifold, MD;  Location: [facility] ENDOSCOPY;  Service: Endoscopy;  Laterality: N/A;  . COLONOSCOPY WITH PROPOFOL N/A 07/06/2018   Procedure: COLONOSCOPY WITH PROPOFOL;   Surgeon: Virgel Manifold, MD;  Location: [facility] ENDOSCOPY;  Service: Endoscopy;  Laterality: N/A;  . CORONARY ANGIOPLASTY    . JOINT REPLACEMENT    . Leg Bypass Surgery Right 08/2010  . OVARIAN CYST SURGERY  1980s  . POPLITEAL ARTERY STENT Right 05/2010  . POPLITEAL ARTERY STENT Right 07/2010  . TOTAL HIP ARTHROPLASTY Left 10/27/2017   Procedure: TOTAL HIP ARTHROPLASTY;  Surgeon: Earnestine Leys, MD;  Location: [facility] ORS;  Service: Orthopedics;  Laterality: Left;    Social History   Socioeconomic History  . Marital status: Married    Spouse name: Joien  . Number of children: 0  . Years of education: Not on file  . Highest education level: 12th grade  Occupational History  . Occupation: Disabled  Tobacco Use  . Smoking status: Current Every Day Smoker    Packs/day: 0.25    Years: 40.00    Pack years: 10.00    Types: Cigarettes  . Smokeless tobacco: Never Used  . Tobacco comment: 7 a day on average  Substance and Sexual Activity  . Alcohol use: No  . Drug use: No  . Sexual activity: Yes  Other Topics Concern  . Not on file  Social History Narrative  . Not on file   Social Determinants of Health   Financial Resource Strain: Low Risk   . Difficulty of Paying Living Expenses: Not very hard  Food Insecurity:   . Worried About Charity fundraiser  in the Last Year: Not on file  . Ran Out of Food in the Last Year: Not on file  Transportation Needs:   . Lack of Transportation (Medical): Not on file  . Lack of Transportation (Non-Medical): Not on file  Physical Activity: Insufficiently Active  . Days of Exercise per Week: 7 days  . Minutes of Exercise per Session: 20 min  Stress:   . Feeling of Stress : Not on file  Social Connections: Unknown  . Frequency of Communication with Friends and Family: More than three times a week  . Frequency of Social Gatherings with Friends and Family: Twice a week  . Attends Religious Services: Never  . Active Member of Clubs or  Organizations: No  . Attends Archivist Meetings: Never  . Marital Status: Not on file  Intimate Partner Violence:   . Fear of Current or Ex-Partner: Not on file  . Emotionally Abused: Not on file  . Physically Abused: Not on file  . Sexually Abused: Not on file    Family History  Problem Relation Age of Onset  . Arthritis Mother   . Diabetes Mother   . Stroke Mother   . Hypertension Mother   . Dementia Mother   . Cancer Father   . Arthritis Sister   . Seizures Sister   . Diabetes Sister   . Hypertension Sister   . COPD Sister   . Cancer Brother   . Stroke Maternal Grandmother   . Diabetes Paternal Grandmother   . Cancer Sister   . Diabetes Sister   . Arthritis Sister   . Diabetes Brother   . Stroke Brother   . Healthy Brother   . Diabetes Brother   . Stroke Brother   . Cancer Brother   . Heart disease Neg Hx   . Breast cancer Neg Hx     No Known Allergies   Review of Systems   Review of Systems: Negative Unless Checked Constitutional: '[]' Weight loss  '[]' Fever  '[]' Chills Cardiac: '[]' Chest pain   '[]'  Atrial Fibrillation  '[]' Palpitations   '[]' Shortness of breath when laying flat   '[]' Shortness of breath with exertion. '[]' Shortness of breath at rest Vascular:  '[]' Pain in legs with walking   '[]' Pain in legs with standing '[]' Pain in legs when laying flat   '[x]' Claudication    '[]' Pain in feet when laying flat    '[]' History of DVT   '[]' Phlebitis   '[]' Swelling in legs   '[]' Varicose veins   '[]' Non-healing ulcers Pulmonary:   '[]' Uses home oxygen   '[]' Productive cough   '[]' Hemoptysis   '[]' Wheeze  '[]' COPD   '[]' Asthma Neurologic:  '[]' Dizziness   '[]' Seizures  '[]' Blackouts '[]' History of stroke   '[]' History of TIA  '[]' Aphasia   '[]' Temporary Blindness   '[]' Weakness or numbness in arm   '[]' Weakness or numbness in leg Musculoskeletal:   '[]' Joint swelling   '[]' Joint pain   '[]' Low back pain  '[]'  History of Knee Replacement '[x]' Arthritis '[]' back Surgeries  '[]'  Spinal Stenosis    Hematologic:  '[]' Easy bruising  '[]' Easy  bleeding   '[]' Hypercoagulable state   '[]' Anemic Gastrointestinal:  '[]' Diarrhea   '[]' Vomiting  '[]' Gastroesophageal reflux/heartburn   '[]' Difficulty swallowing. '[]' Abdominal pain Genitourinary:  '[]' Chronic kidney disease   '[]' Difficult urination  '[]' Anuric   '[]' Blood in urine '[]' Frequent urination  '[]' Burning with urination   '[]' Hematuria Skin:  '[]' Rashes   '[]' Ulcers '[]' Wounds Psychological:  '[]' History of anxiety   '[]'  History of major depression  '[]'  Memory Difficulties      OBJECTIVE:  Physical Exam  BP (!) 153/88 (BP Location: Right Arm)   Pulse 84   Resp 16   Ht '5\' 6"'  (1.676 m)   Wt 113 lb (51.3 kg)   BMI 18.24 kg/m   Gen: WD/WN, NAD Head: [place]/AT, No temporalis wasting.  Ear/Nose/Throat: Hearing grossly intact, nares w/o erythema or drainage Eyes: PER, EOMI, sclera nonicteric.  Neck: Supple, no masses.  No JVD.  Pulmonary:  Good air movement, no use of accessory muscles.  Cardiac: RRR Vascular:  Bilateral feet warm, no wounds Vessel Right Left  Dorsalis Pedis Not Palpable Not Palpable  Posterior Tibial Not Palpable Not Palpable   Gastrointestinal: soft, non-distended. No guarding/no peritoneal signs.  Musculoskeletal: M/S 5/5 throughout.  No deformity or atrophy.  Neurologic: Pain and light touch intact in extremities.  Symmetrical.  Speech is fluent. Motor exam as listed above. Psychiatric: Judgment intact, Mood & affect appropriate for pt's clinical situation. Dermatologic: No Venous rashes. No Ulcers Noted.  No changes consistent with cellulitis. Lymph : No Cervical lymphadenopathy, no lichenification or skin changes of chronic lymphedema.       ASSESSMENT AND PLAN:  1. PAD (peripheral artery disease) ([employer]) I had a discussion with the patient regarding follow-up and INR checks with Coumadin.  Currently the patient takes a 5 mg tablet and alternates with half a 5 mg tablet.  I have advised her to continue to do that.  We will obtain an INR check within the next week.  The patient  admittedly has not had it checked in well over a year.  Advised the patient that depending on how well her INR is controlled will affect how often we checked but generally we would not go a year without checking.  Also necessary for continued medication refills, the patient will need to obtain an ABI as we have not done one in nearly 3 years.  The patient will follow-up within the next month for noninvasive studies.  We will also take over managing the patient's Coumadin and INR. - Protime-INR; Standing - warfarin (COUMADIN) 5 MG tablet; TAKE AS DIRECTED  Dispense: 90 tablet; Refill: 0 - VAS [place] ABI WITH/WO TBI; Future  2. Tobacco abuse Smoking cessation was discussed, 3-10 minutes spent on this topic specifically   3. Mixed hyperlipidemia Continue statin as ordered and reviewed, no changes at this time    Current Outpatient Medications on File Prior to Visit  Medication Sig Dispense Refill  . acetaminophen (TYLENOL) 500 MG tablet Take 500 mg by mouth 2 (two) times daily as needed for moderate pain or headache.    Marland Kitchen amLODipine (NORVASC) 5 MG tablet Take 1 tablet by mouth once daily 90 tablet 3  . atorvastatin (LIPITOR) 10 MG tablet Take 1 tablet (10 mg total) by mouth daily. 90 tablet 3  . cholecalciferol (VITAMIN D3) 25 MCG (1000 UT) tablet Take 1,000 Units by mouth every other day.    . vitamin E 400 UNIT capsule Take 400 Units by mouth daily.    . Blood Pressure Monitoring (BLOOD PRESSURE KIT) DEVI 1 each by Does not apply route 2 (two) times daily. Fluctuating blood pressures, DX I10 (Patient not taking: Reported on 10/26/2019) 1 Device 0  . ferrous sulfate 325 (65 FE) MG tablet Take 325 mg by mouth daily.    Marland Kitchen gabapentin (NEURONTIN) 300 MG capsule TAKE ONE CAPSULE BY MOUTH TWICE DAILY (Patient not taking: Reported on 10/26/2019) 60 capsule 11  . potassium chloride (KLOR-CON 10) 10 MEQ tablet Take 1 tablet (10  mEq total) by mouth 2 (two) times daily. (Patient not taking: Reported on  10/26/2019) 8 tablet 0   No current facility-administered medications on file prior to visit.    There are no Patient Instructions on file for this visit. No follow-ups on file.   Kris Hartmann, NP  This note was completed with Sales executive.  Any errors are purely unintentional.

## 2019-10-27 ENCOUNTER — Other Ambulatory Visit (INDEPENDENT_AMBULATORY_CARE_PROVIDER_SITE_OTHER): Payer: Self-pay | Admitting: Vascular Surgery

## 2019-10-27 DIAGNOSIS — I739 Peripheral vascular disease, unspecified: Secondary | ICD-10-CM

## 2019-11-06 ENCOUNTER — Other Ambulatory Visit (INDEPENDENT_AMBULATORY_CARE_PROVIDER_SITE_OTHER): Payer: Self-pay | Admitting: Nurse Practitioner

## 2019-11-06 ENCOUNTER — Other Ambulatory Visit
Admission: RE | Admit: 2019-11-06 | Discharge: 2019-11-06 | Disposition: A | Discharge status: Home / Self Care | Payer: Medicare HMO | Attending: Nurse Practitioner | Admitting: Nurse Practitioner

## 2019-11-06 ENCOUNTER — Other Ambulatory Visit: Payer: Self-pay

## 2019-11-06 DIAGNOSIS — I739 Peripheral vascular disease, unspecified: Secondary | ICD-10-CM

## 2019-11-06 LAB — PROTIME-INR
INR: 1.4 — ABNORMAL HIGH (ref 0.8–1.2)
Prothrombin Time: 16.9 seconds — ABNORMAL HIGH (ref 11.4–15.2)

## 2019-11-06 NOTE — Progress Notes (Signed)
Patient has been made aware with new medical dosage changes and verbalized that she will have INR recheck next week

## 2019-11-06 NOTE — Progress Notes (Signed)
The patient's INR is 1.4, with a goal of 2-3. The patient alternates between a 1/2 pill to 1 pill daily.  The patient Should start taking a full pill three days in a row.  For example:  Sunday:1 pill Monday: 1 pill Tuesday: 1pill Wednesday: 1/2 pill Thursday: 1 pill Friday: 1/2 pill Saturday:1/2 pill  We will recheck in 1 week or so

## 2019-11-15 ENCOUNTER — Other Ambulatory Visit
Admission: RE | Admit: 2019-11-15 | Discharge: 2019-11-15 | Disposition: A | Discharge status: Home / Self Care | Payer: Medicare HMO | Source: Ambulatory Visit | Attending: Nurse Practitioner | Admitting: Nurse Practitioner

## 2019-11-15 DIAGNOSIS — I739 Peripheral vascular disease, unspecified: Secondary | ICD-10-CM | POA: Insufficient documentation

## 2019-11-15 LAB — PROTIME-INR
INR: 1.7 — ABNORMAL HIGH (ref 0.8–1.2)
Prothrombin Time: 20.1 seconds — ABNORMAL HIGH (ref 11.4–15.2)

## 2019-11-16 NOTE — Progress Notes (Signed)
Patient has been made aware with results notes and verbalize understanding

## 2019-11-16 NOTE — Progress Notes (Signed)
Her INR is 1.7.Marland Kitchena little better.  Let's have the patient start taking a pill a day.  She see's me on 3/24..If she can recheck the day she sees me that would be good

## 2019-11-17 IMAGING — MG MM DIGITAL DIAGNOSTIC BILAT W/ TOMO W/ CAD
8 of 13 series · 8 of 29 positions shown · non-contrast
Comparison: 06/08/2013, 12/02/2010 and earlier.

CLINICAL DATA: Possible palpable lumps at the 6 o'clock and [DATE]
position of the right breast and at the 8 o'clock position of the
left breast on recent clinical examination. The patient is unable to
feel the lumps.

EXAM:
2D DIGITAL DIAGNOSTIC BILATERAL MAMMOGRAM WITH CAD AND ADJUNCT TOMO
LIMITED ULTRASOUND BILATERAL BREAST

[R MLO (1 of 2)]
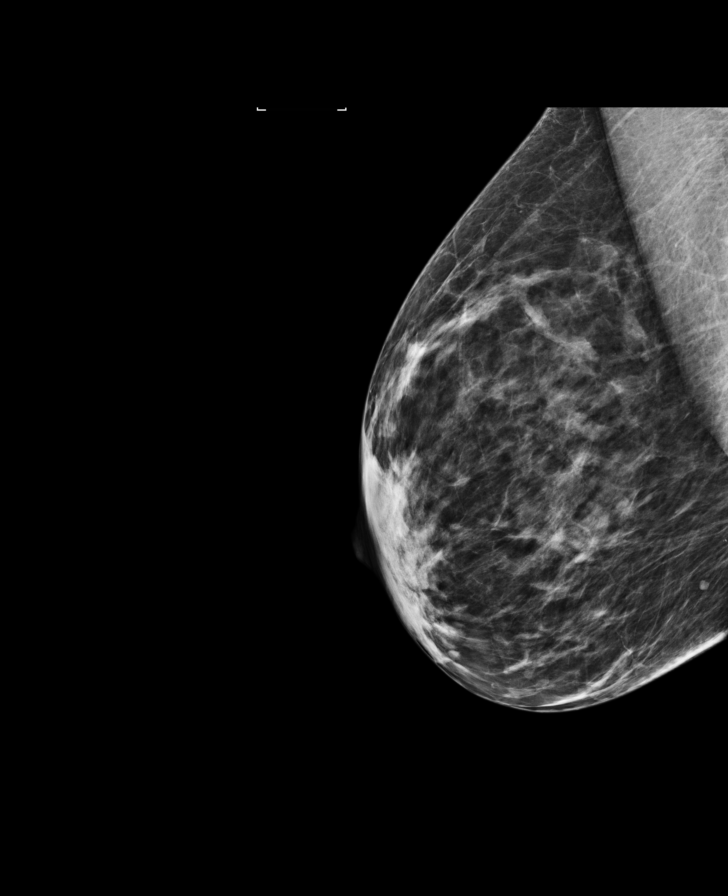

[R MLO (2 of 2)]
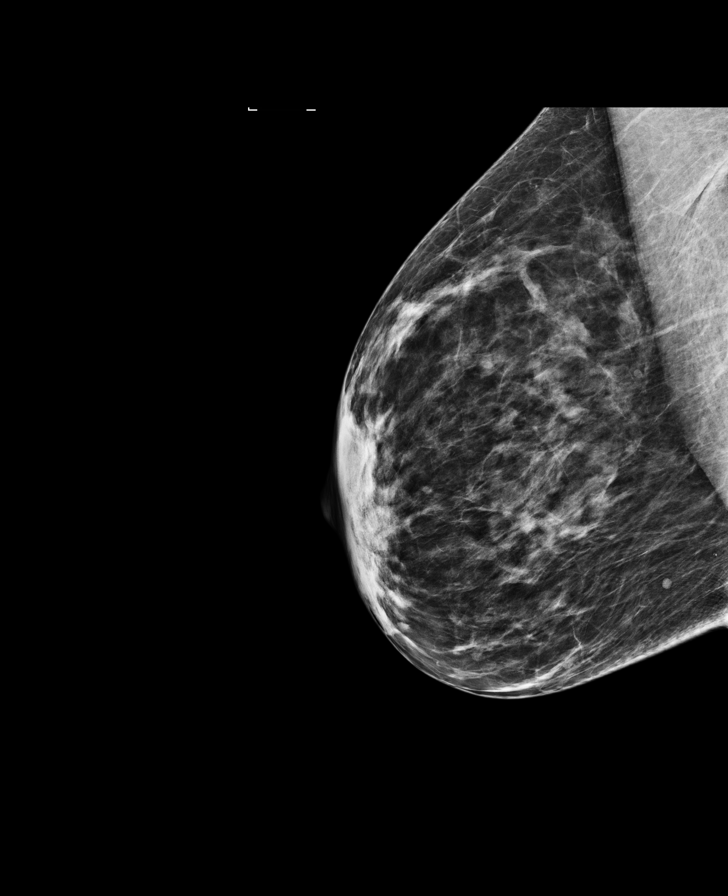

[L CC]
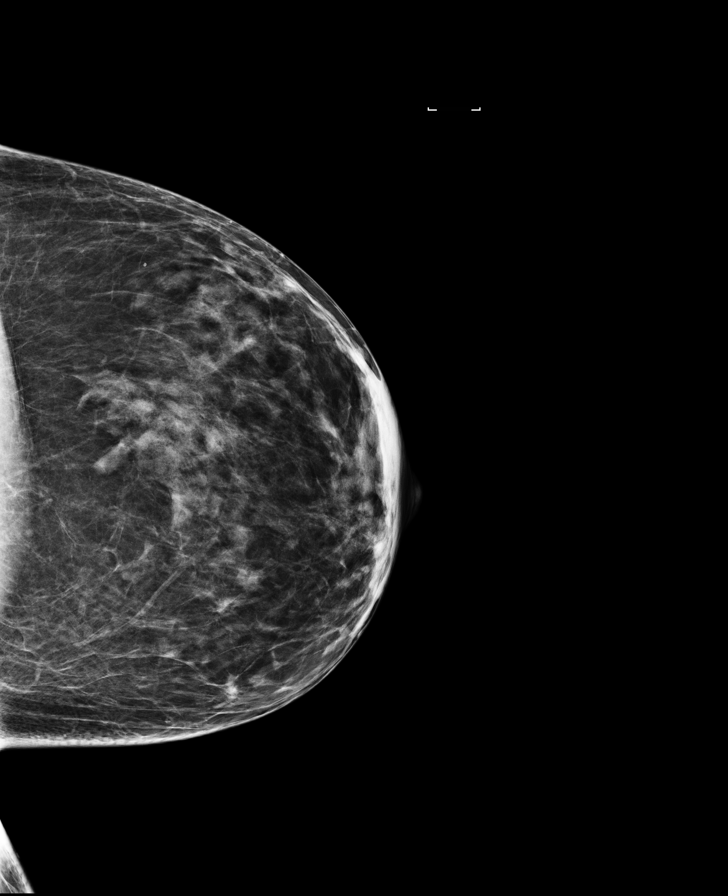

[R MLO synth-2D]
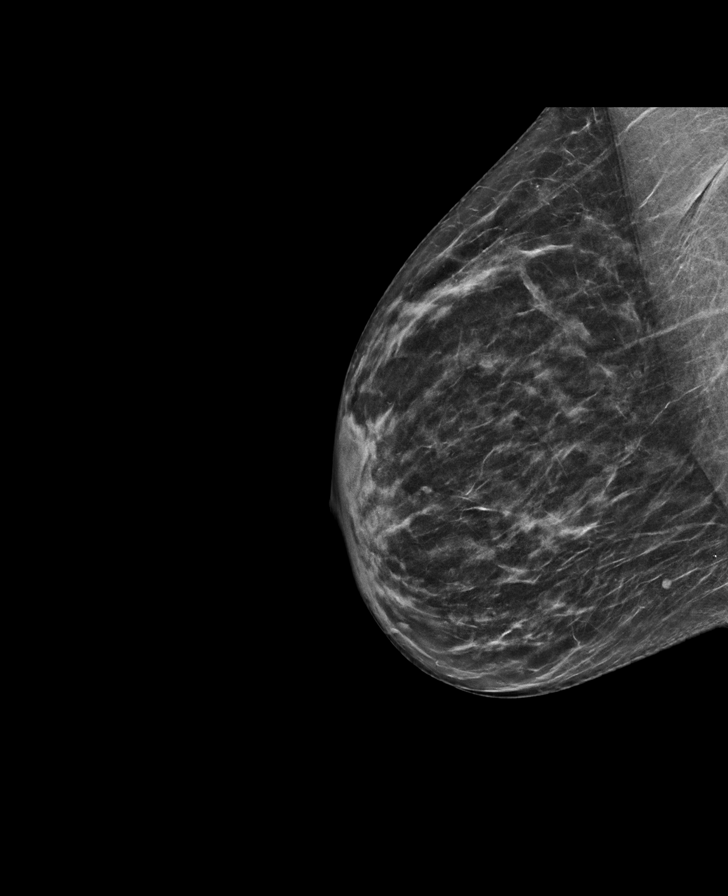

[L MLO synth-2D]
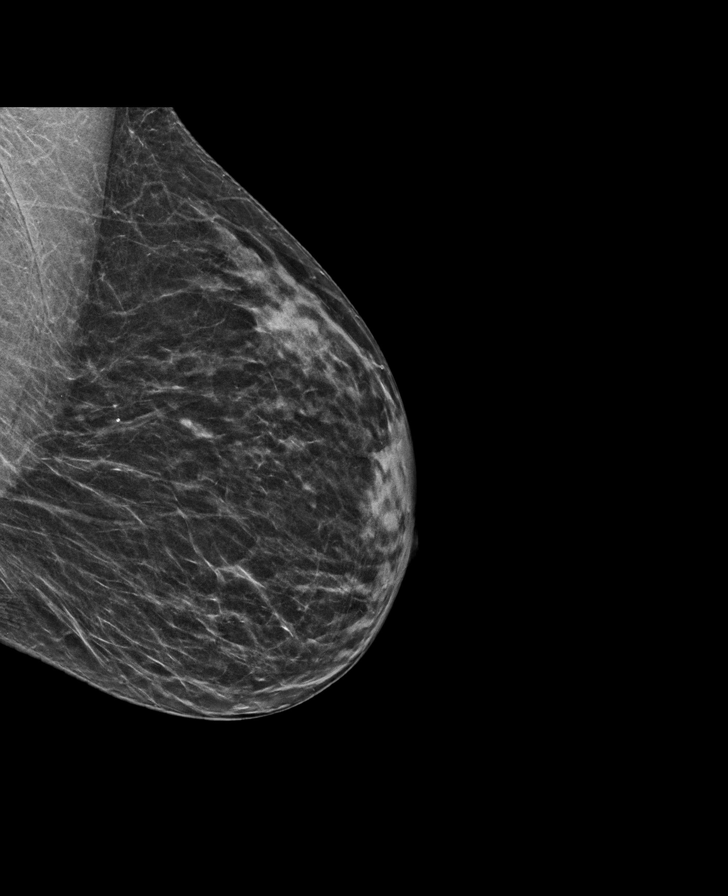

[R CC]
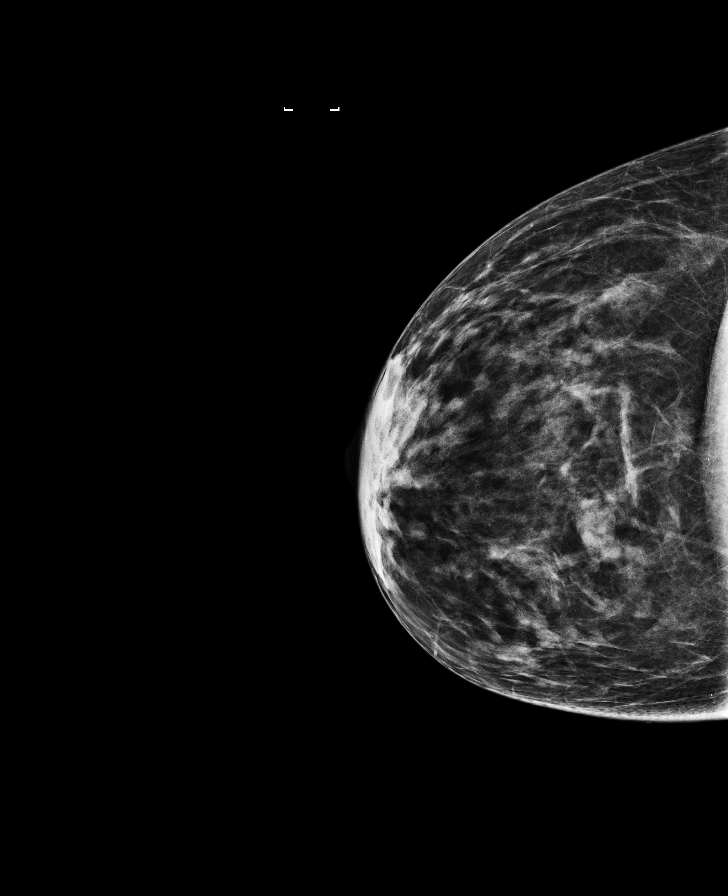

[L MLO]
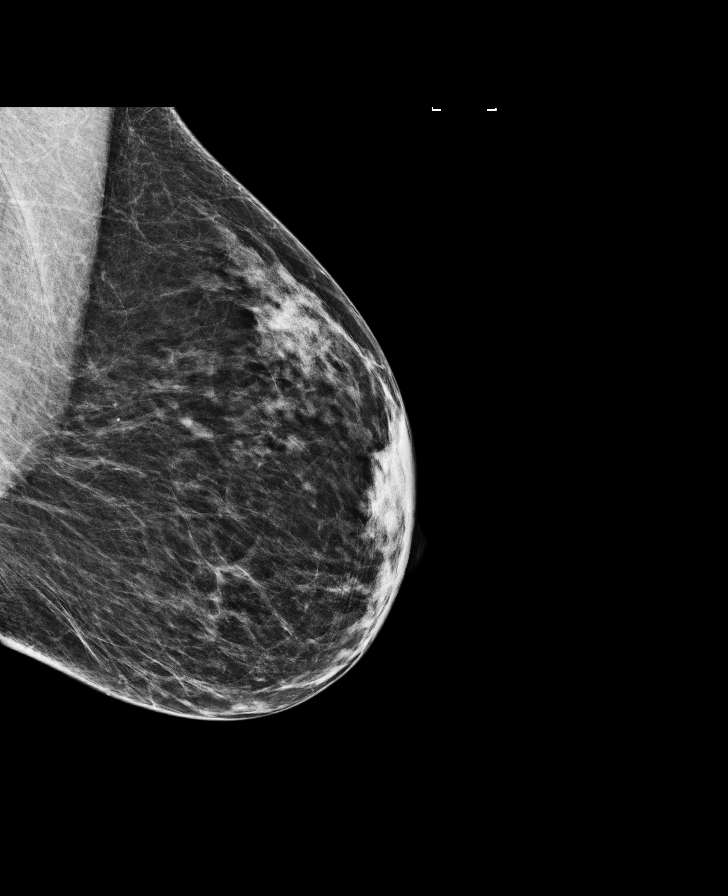

[R CC synth-2D]
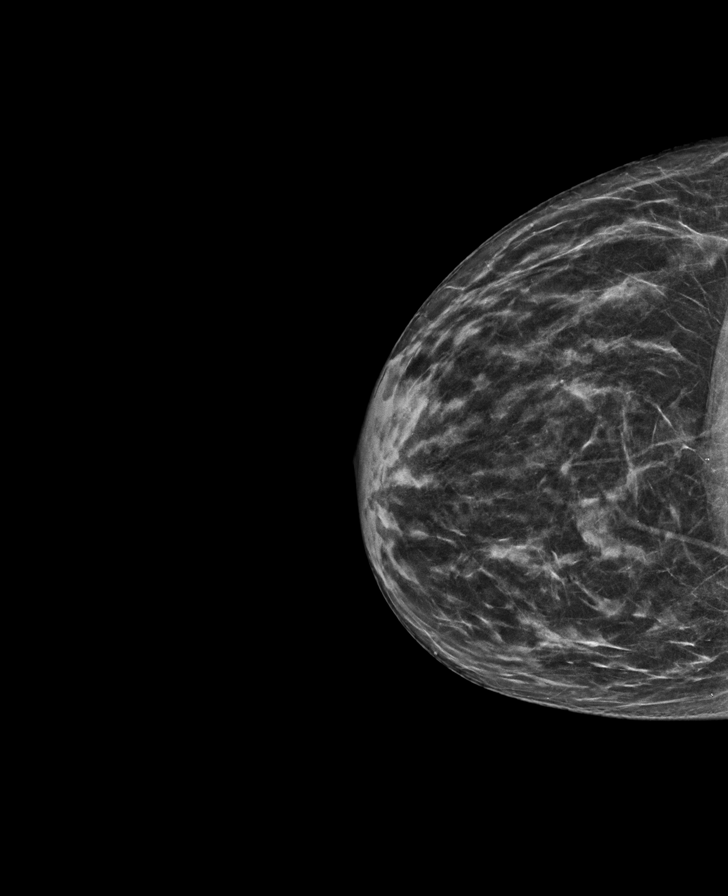

[8 of 29 positions shown; findings below may reference images not displayed]

ACR Breast Density Category c: The breast tissue is heterogeneously
dense, which may obscure small masses.
FINDINGS: Standard 2D and tomosynthesis full field CC and MLO views of both
breasts were obtained.

No findings suspicious for malignancy in either breast.
Specifically, no mammographic abnormalities in the lower right
breast or in the lower inner left breast in the area of palpable
concern.

Mammographic images were processed with CAD.

On physical exam, there is a palpable ridge of tissue in the lower
inner right breast and there is a palpable ridge of tissue in the
lower right breast which may account for the palpable findings. I do
not palpate a firm, fixed mass in either breast.

Targeted right breast ultrasound is performed, showing normal
fibrofatty and scattered fibroglandular tissue in the lower breast
at the 6 o'clock to 7 o'clock position in the area of palpable
concern. No cyst, solid mass or abnormal acoustic shadowing is
identified.

Targeted left breast ultrasound is performed, showing normal
fibrofatty and scattered fibroglandular tissue in the lower outer
breast at the 8 o'clock position in the area of palpable concern. No
cyst, solid mass or abnormal acoustic shadowing is identified.
IMPRESSION: No mammographic or sonographic evidence of malignancy involving
either breast.

RECOMMENDATION:
Screening mammogram in one year.(Code:[id number]-[id number])

I have discussed the findings and recommendations with the patient.
Results were also provided in writing at the conclusion of the
visit. If applicable, a reminder letter will be sent to the patient
regarding the next appointment.

BI-RADS CATEGORY  1: Negative.

## 2019-11-21 ENCOUNTER — Other Ambulatory Visit (INDEPENDENT_AMBULATORY_CARE_PROVIDER_SITE_OTHER): Payer: Self-pay | Admitting: Nurse Practitioner

## 2019-11-21 DIAGNOSIS — I739 Peripheral vascular disease, unspecified: Secondary | ICD-10-CM

## 2019-11-22 ENCOUNTER — Other Ambulatory Visit
Admission: RE | Admit: 2019-11-22 | Discharge: 2019-11-22 | Disposition: A | Discharge status: Home / Self Care | Payer: Medicare HMO | Source: Ambulatory Visit | Attending: Nurse Practitioner | Admitting: Nurse Practitioner

## 2019-11-22 ENCOUNTER — Ambulatory Visit (INDEPENDENT_AMBULATORY_CARE_PROVIDER_SITE_OTHER): Payer: Medicare HMO | Admitting: Nurse Practitioner

## 2019-11-22 ENCOUNTER — Other Ambulatory Visit: Payer: Self-pay

## 2019-11-22 ENCOUNTER — Ambulatory Visit (INDEPENDENT_AMBULATORY_CARE_PROVIDER_SITE_OTHER): Payer: Medicare HMO

## 2019-11-22 ENCOUNTER — Encounter (INDEPENDENT_AMBULATORY_CARE_PROVIDER_SITE_OTHER): Payer: Self-pay | Admitting: Nurse Practitioner

## 2019-11-22 VITALS — BP 135/71 | HR 71 | Ht 66.0 in | Wt 132.0 lb

## 2019-11-22 DIAGNOSIS — I739 Peripheral vascular disease, unspecified: Secondary | ICD-10-CM | POA: Insufficient documentation

## 2019-11-22 DIAGNOSIS — I10 Essential (primary) hypertension: Secondary | ICD-10-CM | POA: Diagnosis not present

## 2019-11-22 DIAGNOSIS — E782 Mixed hyperlipidemia: Secondary | ICD-10-CM | POA: Diagnosis not present

## 2019-11-22 DIAGNOSIS — Z720 Tobacco use: Secondary | ICD-10-CM

## 2019-11-22 LAB — PROTIME-INR
INR: 2 — ABNORMAL HIGH (ref 0.8–1.2)
Prothrombin Time: 22.7 seconds — ABNORMAL HIGH (ref 11.4–15.2)

## 2019-11-22 MED ORDER — WARFARIN SODIUM 5 MG PO TABS: ORAL_TABLET | ORAL | 0 refills | Status: DC

## 2019-11-22 NOTE — Progress Notes (Signed)
SUBJECTIVE:  Patient ID: Mary [name], female    DOB: 05-11-1951, 69 y.o.   MRN: [id number] Chief Complaint  Patient presents with  . Follow-up    U/S Follow up    HPI  Mary [name] is a 69 y.o. female that returns to our office today for follow-up studies related to her peripheral arterial disease.  The patient's last intervention was in 2011 with SFA to popliteal stents.  The patient was placed on Coumadin for management however the patient was lost to follow-up for some time.  Prior to the last appointment the patient had been seen in our office since June 2018 and had not had her INR checked in well over a year.  Refills were done by her PCP and they may have had INR checks there however we do not have any record.  Since her last visit the patient has been compliant with getting her INR checked regularly.  Today her INR came back at 2 which is within therapeutic levels.  The patient denies any worsening of claudication-like symptoms.  The patient continues to have numbness of her right lower extremity following her previous intervention.  She denies any numbness of her left lower extremity.  She denies any rest pain or wounds on her bilateral lower extremities.  The patient continues to smoke on a daily basis.  Today noninvasive tests show the patient has an ABI of 0.52 on the right and 0.47 on the left.  Additional imaging was done which show the SFA to the popliteal stents placed in 2011 are occluded.  The common femoral artery and profunda are currently patent.  The left lower extremity shows that the distal SFA is occluded however with a collateral feed.  Left common femoral artery, profunda and the proximal to mid SFA are patent.  The patient has slightly dampened toe waveforms bilaterally.  There are monophasic waveforms seen in the bilateral tibial arteries.  Past Medical History:  Diagnosis Date  . Arthritis   . Hyperlipidemia   . Hypertension   . Hyperuricemia   . Osteoporosis    . PAD (peripheral artery disease) ([place]) 02/2010   angiplasty, popliteal artery  . Tobacco abuse     Past Surgical History:  Procedure Laterality Date  . ANGIOPLASTY  July 2011   PAD- popliteal artery  . COLONOSCOPY WITH PROPOFOL N/A 01/13/2018   Procedure: COLONOSCOPY WITH PROPOFOL;  Surgeon: Lin Landsman, MD;  Location: [facility] ENDOSCOPY;  Service: Gastroenterology;  Laterality: N/A;  . COLONOSCOPY WITH PROPOFOL N/A 01/14/2018   Procedure: COLONOSCOPY WITH PROPOFOL;  Surgeon: Virgel Manifold, MD;  Location: [facility] ENDOSCOPY;  Service: Endoscopy;  Laterality: N/A;  . COLONOSCOPY WITH PROPOFOL N/A 07/06/2018   Procedure: COLONOSCOPY WITH PROPOFOL;  Surgeon: Virgel Manifold, MD;  Location: [facility] ENDOSCOPY;  Service: Endoscopy;  Laterality: N/A;  . CORONARY ANGIOPLASTY    . JOINT REPLACEMENT    . Leg Bypass Surgery Right 08/2010  . OVARIAN CYST SURGERY  1980s  . POPLITEAL ARTERY STENT Right 05/2010  . POPLITEAL ARTERY STENT Right 07/2010  . TOTAL HIP ARTHROPLASTY Left 10/27/2017   Procedure: TOTAL HIP ARTHROPLASTY;  Surgeon: Earnestine Leys, MD;  Location: [facility] ORS;  Service: Orthopedics;  Laterality: Left;    Social History   Socioeconomic History  . Marital status: Married    Spouse name: Joien  . Number of children: 0  . Years of education: Not on file  . Highest education level: 12th grade  Occupational History  .  Occupation: Disabled  Tobacco Use  . Smoking status: Current Every Day Smoker    Packs/day: 0.25    Years: 40.00    Pack years: 10.00    Types: Cigarettes  . Smokeless tobacco: Never Used  . Tobacco comment: 7 a day on average  Substance and Sexual Activity  . Alcohol use: No  . Drug use: No  . Sexual activity: Yes  Other Topics Concern  . Not on file  Social History Narrative  . Not on file   Social Determinants of Health   Financial Resource Strain: Low Risk   . Difficulty of Paying Living Expenses: Not very hard  Food Insecurity:   . Worried  About Charity fundraiser in the Last Year:   . Arboriculturist in the Last Year:   Transportation Needs:   . Film/video editor (Medical):   Marland Kitchen Lack of Transportation (Non-Medical):   Physical Activity: Insufficiently Active  . Days of Exercise per Week: 7 days  . Minutes of Exercise per Session: 20 min  Stress:   . Feeling of Stress :   Social Connections: Unknown  . Frequency of Communication with Friends and Family: More than three times a week  . Frequency of Social Gatherings with Friends and Family: Twice a week  . Attends Religious Services: Never  . Active Member of Clubs or Organizations: No  . Attends Archivist Meetings: Never  . Marital Status: Not on file  Intimate Partner Violence:   . Fear of Current or Ex-Partner:   . Emotionally Abused:   Marland Kitchen Physically Abused:   . Sexually Abused:     Family History  Problem Relation Age of Onset  . Arthritis Mother   . Diabetes Mother   . Stroke Mother   . Hypertension Mother   . Dementia Mother   . Cancer Father   . Arthritis Sister   . Seizures Sister   . Diabetes Sister   . Hypertension Sister   . COPD Sister   . Cancer Brother   . Stroke Maternal Grandmother   . Diabetes Paternal Grandmother   . Cancer Sister   . Diabetes Sister   . Arthritis Sister   . Diabetes Brother   . Stroke Brother   . Healthy Brother   . Diabetes Brother   . Stroke Brother   . Cancer Brother   . Heart disease Neg Hx   . Breast cancer Neg Hx     No Known Allergies   Review of Systems   Review of Systems: Negative Unless Checked Constitutional: _0 Weight loss  _1 Fever  _2 Chills Cardiac: _3 Chest pain   _4  Atrial Fibrillation  _5 Palpitations   _6 Shortness of breath when laying flat   _7 Shortness of breath with exertion. _8 Shortness of breath at rest Vascular:  _9 Pain in legs with walking   _10 Pain in legs with standing _11 Pain in legs when laying flat   _12 Claudication    _13 Pain in feet when laying flat    _14 History of  DVT   _15 Phlebitis   _16 Swelling in legs   _17 Varicose veins   _18 Non-healing ulcers Pulmonary:   _19 Uses home oxygen   _20 Productive cough   _21 Hemoptysis   _22 Wheeze  _23 COPD   _24 Asthma Neurologic:  _25 Dizziness   _26 Seizures  _27 Blackouts _28 History of stroke   _29 History of TIA  _30 Aphasia   _31 Temporary Blindness   _32 Weakness or numbness in arm   _33 Weakness or numbness in leg Musculoskeletal:   _34 Joint swelling   _35 Joint pain   _36   Low back pain  _0  History of Knee Replacement _1 Arthritis _2 back Surgeries  _3  Spinal Stenosis    Hematologic:  _4 Easy bruising  _5 Easy bleeding   _6 Hypercoagulable state   _7 Anemic Gastrointestinal:  _8 Diarrhea   _9 Vomiting  _10 Gastroesophageal reflux/heartburn   _11 Difficulty swallowing. _12 Abdominal pain Genitourinary:  _13 Chronic kidney disease   _14 Difficult urination  _15 Anuric   _16 Blood in urine _17 Frequent urination  _18 Burning with urination   _19 Hematuria Skin:  _20 Rashes   _21 Ulcers _22 Wounds Psychological:  _23 History of anxiety   _24  History of major depression  _25  Memory Difficulties      OBJECTIVE:   Physical Exam  BP 135/71   Pulse 71   Ht _26  (1.676 m)   Wt 132 lb (59.9 kg)   BMI 21.31 kg/m   Gen: WD/WN, NAD Head: [place]/AT, No temporalis wasting.  Ear/Nose/Throat: Hearing grossly intact, nares w/o erythema or drainage Eyes: PER, EOMI, sclera nonicteric.  Neck: Supple, no masses.  No JVD.  Pulmonary:  Good air movement, no use of accessory muscles.  Cardiac: RRR Vascular:  Left foot cool Vessel Right Left  Radial Palpable Palpable  Dorsalis Pedis Not Palpable  not palpable  Posterior Tibial  not palpable  not palpable   Gastrointestinal: soft, non-distended. No guarding/no peritoneal signs.  Musculoskeletal: M/S 5/5 throughout.  No deformity or atrophy.  Neurologic: Pain and light touch intact in extremities.  Symmetrical.  Speech is fluent. Motor exam as listed above. Psychiatric: Judgment intact, Mood & affect appropriate for pt's clinical  situation. Dermatologic: No Venous rashes. No Ulcers Noted.  No changes consistent with cellulitis. Lymph : No Cervical lymphadenopathy, no lichenification or skin changes of chronic lymphedema.       ASSESSMENT AND PLAN:  1. PAD (peripheral artery disease) ([place]) Had a long discussion with the patient regarding her test results and recommendation based off of those.  Based on the noninvasive studies the patient has occlusions in her bilateral lower extremities.  Currently the patient is not greatly symptomatic however with the current occlusions, combined with active smoking, increase the likelihood of sudden ischemia.    I recommended that the patient undergo angiogram of her bilateral lower extremities due to the fact that she faces risk for worsening ischemia in addition to possibly requiring a more extensive intervention if these occlusions are not able to be opened percutaneously.  I also discussed with the patient that not intervening could possibly result in a situation where her legs become ischemic and intervention would be urgently necessary.  The risk of continued occlusion include numbness and possible loss of motor function but also includes risk of possible amputation.  Patient is aware of all the risk of delaying procedure however at this time she does not wish to move forward with angiogram instead wishes to pursue conservative monitoring.  Patient is advised to contact our office if she begins to have sudden pain, numbness coldness or discoloration of her lower extremities.  Otherwise we will have the patient return to our office in 3 months with noninvasive studies.  Patient is also advised today to continue taking her Coumadin with 1 full tablet daily with a recheck in 2 weeks. - warfarin (COUMADIN) 5 MG tablet; Take as directed  Dispense: 90 tablet; Refill: 0  2. Essential hypertension Continue statin as ordered and reviewed, no changes at this time   3. Mixed  hyperlipidemia Continue statin as ordered and reviewed, no changes at this time   4. Tobacco abuse A long discussion with the patient regarding  her smoking this discussion lasted approximately 5 minutes.  I discussed with the patient that continued smoking would worsen her atherosclerotic disease in her lower extremities.  I also discussed with her that any intervention that was done would be more likely to fail sooner due to continued cigarette use.  Patient understands and is aware of the risk of continued smoking.  Cessation is again encouraged   Current Outpatient Medications on File Prior to Visit  Medication Sig Dispense Refill  . acetaminophen (TYLENOL) 500 MG tablet Take 500 mg by mouth 2 (two) times daily as needed for moderate pain or headache.    Marland Kitchen amLODipine (NORVASC) 5 MG tablet Take 1 tablet by mouth once daily 90 tablet 3  . atorvastatin (LIPITOR) 10 MG tablet Take 1 tablet (10 mg total) by mouth daily. 90 tablet 3  . Blood Pressure Monitoring (BLOOD PRESSURE KIT) DEVI 1 each by Does not apply route 2 (two) times daily. Fluctuating blood pressures, DX I10 1 Device 0  . cholecalciferol (VITAMIN D3) 25 MCG (1000 UT) tablet Take 1,000 Units by mouth every other day.    . potassium chloride (KLOR-CON 10) 10 MEQ tablet Take 1 tablet (10 mEq total) by mouth 2 (two) times daily. 8 tablet 0  . vitamin E 400 UNIT capsule Take 400 Units by mouth daily.    . ferrous sulfate 325 (65 FE) MG tablet Take 325 mg by mouth daily.    Marland Kitchen gabapentin (NEURONTIN) 300 MG capsule TAKE ONE CAPSULE BY MOUTH TWICE DAILY (Patient not taking: Reported on 10/26/2019) 60 capsule 11   No current facility-administered medications on file prior to visit.    There are no Patient Instructions on file for this visit. No follow-ups on file.   Kris Hartmann, NP  This note was completed with Sales executive.  Any errors are purely unintentional.

## 2019-11-30 ENCOUNTER — Telehealth (INDEPENDENT_AMBULATORY_CARE_PROVIDER_SITE_OTHER): Payer: Self-pay

## 2019-11-30 NOTE — Telephone Encounter (Signed)
I sent a refill on 03/24 the day that I saw her.  The [facility] on [address]

## 2019-12-04 NOTE — Telephone Encounter (Signed)
A detail message has been left on the patient voicemail informing her that Rx was e-scribe to [facility] on 3/24 and they have receive the Rx. [facility] pharmacy informed that Rx for Warfarin will be ready today for patient to pick up.

## 2020-01-22 ENCOUNTER — Ambulatory Visit: Payer: Medicare HMO | Admitting: Family Medicine

## 2020-02-05 ENCOUNTER — Encounter: Payer: Self-pay | Admitting: Family Medicine

## 2020-02-05 ENCOUNTER — Other Ambulatory Visit: Payer: Self-pay

## 2020-02-05 ENCOUNTER — Ambulatory Visit (INDEPENDENT_AMBULATORY_CARE_PROVIDER_SITE_OTHER): Payer: Medicare HMO | Admitting: Family Medicine

## 2020-02-05 VITALS — BP 132/80 | HR 90 | Temp 97.8°F | Resp 14 | Ht 66.0 in | Wt 131.8 lb

## 2020-02-05 DIAGNOSIS — Z5181 Encounter for therapeutic drug level monitoring: Secondary | ICD-10-CM | POA: Diagnosis not present

## 2020-02-05 DIAGNOSIS — I2510 Atherosclerotic heart disease of native coronary artery without angina pectoris: Secondary | ICD-10-CM

## 2020-02-05 DIAGNOSIS — Z720 Tobacco use: Secondary | ICD-10-CM | POA: Diagnosis not present

## 2020-02-05 DIAGNOSIS — I739 Peripheral vascular disease, unspecified: Secondary | ICD-10-CM

## 2020-02-05 DIAGNOSIS — E782 Mixed hyperlipidemia: Secondary | ICD-10-CM | POA: Diagnosis not present

## 2020-02-05 DIAGNOSIS — I10 Essential (primary) hypertension: Secondary | ICD-10-CM | POA: Diagnosis not present

## 2020-02-05 DIAGNOSIS — Z7901 Long term (current) use of anticoagulants: Secondary | ICD-10-CM | POA: Diagnosis not present

## 2020-02-05 DIAGNOSIS — Z8739 Personal history of other diseases of the musculoskeletal system and connective tissue: Secondary | ICD-10-CM

## 2020-02-05 LAB — CBC WITH DIFFERENTIAL/PLATELET
Absolute Monocytes: 661 cells/uL (ref 200–950)
Basophils Absolute: 38 cells/uL (ref 0–200)
Basophils Relative: 0.5 %
Eosinophils Absolute: 167 cells/uL (ref 15–500)
Eosinophils Relative: 2.2 %
HCT: 41.7 % (ref 35.0–45.0)
Hemoglobin: 13.9 g/dL (ref 11.7–15.5)
Lymphs Abs: 4834 cells/uL — ABNORMAL HIGH (ref 850–3900)
MCH: 30 pg (ref 27.0–33.0)
MCHC: 33.3 g/dL (ref 32.0–36.0)
MCV: 89.9 fL (ref 80.0–100.0)
MPV: 9.2 fL (ref 7.5–12.5)
Monocytes Relative: 8.7 %
Neutro Abs: 1900 cells/uL (ref 1500–7800)
Neutrophils Relative %: 25 %
Platelets: 313 10*3/uL (ref 140–400)
RBC: 4.64 10*6/uL (ref 3.80–5.10)
RDW: 13.5 % (ref 11.0–15.0)
Total Lymphocyte: 63.6 %
WBC: 7.6 10*3/uL (ref 3.8–10.8)

## 2020-02-05 LAB — LIPID PANEL
Cholesterol: 130 mg/dL (ref <200)
HDL: 43 mg/dL — ABNORMAL LOW (ref >=50)
LDL Cholesterol (Calc): 67 mg/dL (calc)
Non-HDL Cholesterol (Calc): 87 mg/dL (calc) (ref <130)
Total CHOL/HDL Ratio: 3 (calc) (ref <5.0)
Triglycerides: 112 mg/dL (ref <150)

## 2020-02-05 LAB — COMPLETE METABOLIC PANEL WITH GFR
AG Ratio: 1.6 (calc) (ref 1.0–2.5)
ALT: 12 U/L (ref 6–29)
AST: 17 U/L (ref 10–35)
Albumin: 4.4 g/dL (ref 3.6–5.1)
Alkaline phosphatase (APISO): 91 U/L (ref 37–153)
BUN/Creatinine Ratio: 7 (calc) (ref 6–22)
BUN: 6 mg/dL — ABNORMAL LOW (ref 7–25)
CO2: 31 mmol/L (ref 20–32)
Calcium: 9.7 mg/dL (ref 8.6–10.4)
Chloride: 104 mmol/L (ref 98–110)
Creat: 0.81 mg/dL (ref 0.50–0.99)
GFR, Est African American: 88 mL/min/{1.73_m2} (ref >=60)
GFR, Est Non African American: 76 mL/min/{1.73_m2} (ref >=60)
Globulin: 2.8 g/dL (calc) (ref 1.9–3.7)
Glucose, Bld: 80 mg/dL (ref 65–139)
Potassium: 3.7 mmol/L (ref 3.5–5.3)
Sodium: 141 mmol/L (ref 135–146)
Total Bilirubin: 0.4 mg/dL (ref 0.2–1.2)
Total Protein: 7.2 g/dL (ref 6.1–8.1)

## 2020-02-05 MED ORDER — ATORVASTATIN CALCIUM 10 MG PO TABS: 10.0000 mg | ORAL_TABLET | Freq: Every day | ORAL | 3 refills | Status: DC

## 2020-02-05 MED ORDER — AMLODIPINE BESYLATE 5 MG PO TABS: 5.0000 mg | ORAL_TABLET | Freq: Every day | ORAL | 3 refills | Status: DC

## 2020-02-05 NOTE — Progress Notes (Signed)
Name: Mary [name]   MRN: [id number]    DOB: [birth year]   Date:02/05/2020       Progress Note  Chief Complaint  Patient presents with  . Follow-up     Subjective:   Mary [name] is a 69 y.o. female, presents to clinic for routine follow up on the conditions listed above.  Current smoker - only smoking 7 a day, she is trying to cut back and is trusting in God and she doesn't currently want  Smoking cessation instruction/counseling given:  counseled patient on the dangers of tobacco use, advised patient to stop smoking, and reviewed strategies to maximize success - counseled as pt would allow  Hypertension:  Currently managed on amlodipine 5 mg daily Pt reports med compliance and denies any SE.   Blood pressure today is well controlled. BP Readings from Last 3 Encounters:  02/05/20 132/80  11/22/19 135/71  10/26/19 (!) 153/88   Pt denies SOB, exertional sx, LE edema, palpitation, Ha's, visual disturbances, lightheadedness, hypotension, syncope. Dietary efforts for BP?   Healthy diet trying to work on it  Hyperlipidemia: Current Medication Regimen:  lipitor 10 mg, takes daily good compliance no SE or concerns Last Lipids: Lab Results  Component Value Date   CHOL 141 03/29/2019   HDL 38 (L) 03/29/2019   LDLCALC 70 03/29/2019   TRIG 248 (H) 03/29/2019   CHOLHDL 3.7 03/29/2019  - Current Diet:  healthy - Denies: Chest pain, shortness of breath, myalgias.  PAD and CAD - sees Vascular for PAD, they are managing coumadin - last PT/INR check was about 3 months ago. Sees Dr. Clayborn Bigness for cardiac issues-although she has not had a visit in some time and she denies any angina or exertional symptoms  Gout - well controlled, she states that she rarely has flares she thought she started to have a little bit of a gout flare and drink cherry juice which relieved her symptoms and she did not have any swelling redness or severe pain.  Patient Active Problem List   Diagnosis Date Noted    . Anticoagulated on Coumadin 02/05/2020  . History of gout 06/23/2019  . Coronary artery disease 02/16/2019  . Benign neoplasm of cecum   . Benign neoplasm of transverse colon   . Polyp of sigmoid colon   . External hemorrhoids   . Hip arthritis 10/27/2017  . Breast lump on left side at 8 o'clock position 07/08/2017  . Breast lump on right side at 7 o'clock position 11/14/2015  . Encounter for monitoring Coumadin therapy 02/27/2015  . PAD (peripheral artery disease) ([place])   . Hypertension   . Hyperlipidemia   . Tobacco abuse   . Hyperuricemia     Past Surgical History:  Procedure Laterality Date  . ANGIOPLASTY  July 2011   PAD- popliteal artery  . COLONOSCOPY WITH PROPOFOL N/A 01/13/2018   Procedure: COLONOSCOPY WITH PROPOFOL;  Surgeon: Lin Landsman, MD;  Location: [facility] ENDOSCOPY;  Service: Gastroenterology;  Laterality: N/A;  . COLONOSCOPY WITH PROPOFOL N/A 01/14/2018   Procedure: COLONOSCOPY WITH PROPOFOL;  Surgeon: Virgel Manifold, MD;  Location: [facility] ENDOSCOPY;  Service: Endoscopy;  Laterality: N/A;  . COLONOSCOPY WITH PROPOFOL N/A 07/06/2018   Procedure: COLONOSCOPY WITH PROPOFOL;  Surgeon: Virgel Manifold, MD;  Location: [facility] ENDOSCOPY;  Service: Endoscopy;  Laterality: N/A;  . CORONARY ANGIOPLASTY    . JOINT REPLACEMENT    . Leg Bypass Surgery Right 08/2010  . OVARIAN CYST SURGERY  1980s  . POPLITEAL  ARTERY STENT Right 05/2010  . POPLITEAL ARTERY STENT Right 07/2010  . TOTAL HIP ARTHROPLASTY Left 10/27/2017   Procedure: TOTAL HIP ARTHROPLASTY;  Surgeon: Earnestine Leys, MD;  Location: [facility] ORS;  Service: Orthopedics;  Laterality: Left;    Family History  Problem Relation Age of Onset  . Arthritis Mother   . Diabetes Mother   . Stroke Mother   . Hypertension Mother   . Dementia Mother   . Cancer Father   . Arthritis Sister   . Seizures Sister   . Diabetes Sister   . Hypertension Sister   . COPD Sister   . Cancer Brother   . Stroke Maternal  Grandmother   . Diabetes Paternal Grandmother   . Cancer Sister   . Diabetes Sister   . Arthritis Sister   . Diabetes Brother   . Stroke Brother   . Healthy Brother   . Diabetes Brother   . Stroke Brother   . Cancer Brother   . Heart disease Neg Hx   . Breast cancer Neg Hx     Social History   Tobacco Use  . Smoking status: Current Every Day Smoker    Packs/day: 0.33    Years: 40.00    Pack years: 13.20    Types: Cigarettes  . Smokeless tobacco: Never Used  . Tobacco comment: 7 a day on average  Substance Use Topics  . Alcohol use: No  . Drug use: No      Current Outpatient Medications:  .  acetaminophen (TYLENOL) 500 MG tablet, Take 500 mg by mouth 2 (two) times daily as needed for moderate pain or headache., Disp: , Rfl:  .  amLODipine (NORVASC) 5 MG tablet, Take 1 tablet by mouth once daily, Disp: 90 tablet, Rfl: 3 .  atorvastatin (LIPITOR) 10 MG tablet, Take 1 tablet (10 mg total) by mouth daily., Disp: 90 tablet, Rfl: 3 .  cholecalciferol (VITAMIN D3) 25 MCG (1000 UT) tablet, Take 1,000 Units by mouth every other day., Disp: , Rfl:  .  potassium chloride (KLOR-CON 10) 10 MEQ tablet, Take 1 tablet (10 mEq total) by mouth 2 (two) times daily., Disp: 8 tablet, Rfl: 0 .  vitamin E 400 UNIT capsule, Take 400 Units by mouth daily., Disp: , Rfl:  .  warfarin (COUMADIN) 5 MG tablet, Take as directed, Disp: 90 tablet, Rfl: 0 .  Blood Pressure Monitoring (BLOOD PRESSURE KIT) DEVI, 1 each by Does not apply route 2 (two) times daily. Fluctuating blood pressures, DX I10, Disp: 1 Device, Rfl: 0  No Known Allergies  Chart Review Today: I personally reviewed active problem list, medication list, allergies, family history, social history, health maintenance, notes from last encounter, lab results, imaging with the patient/caregiver today.   Review of Systems  10 Systems reviewed and are negative for acute change except as noted in the HPI.  Objective:    Vitals:   02/05/20  1135  BP: 132/80  Pulse: 90  Resp: 14  Temp: 97.8 F (36.6 C)  SpO2: 97%  Weight: 131 lb 12.8 oz (59.8 kg)  Height: _0  (1.676 m)    Body mass index is 21.27 kg/m.  Physical Exam Vitals and nursing note reviewed.  Constitutional:      General: She is not in acute distress.    Appearance: Normal appearance. She is well-developed. She is not ill-appearing, toxic-appearing or diaphoretic.     Interventions: Face mask in place.     Comments: Pleasant, well-appearing female  HENT:  Head: Normocephalic and atraumatic.     Right Ear: External ear normal.     Left Ear: External ear normal.  Eyes:     General: Lids are normal. No scleral icterus.       Right eye: No discharge.        Left eye: No discharge.     Conjunctiva/sclera: Conjunctivae normal.  Neck:     Trachea: Phonation normal. No tracheal deviation.  Cardiovascular:     Rate and Rhythm: Normal rate and regular rhythm.     Pulses: Normal pulses.          Radial pulses are 2+ on the right side and 2+ on the left side.       Posterior tibial pulses are 2+ on the right side and 2+ on the left side.     Heart sounds: Normal heart sounds. No murmur. No friction rub. No gallop.   Pulmonary:     Effort: Pulmonary effort is normal. No respiratory distress.     Breath sounds: Normal breath sounds. No stridor. No wheezing, rhonchi or rales.  Chest:     Chest wall: No tenderness.  Abdominal:     General: Bowel sounds are normal. There is no distension.     Palpations: Abdomen is soft.  Musculoskeletal:     Right lower leg: No edema.     Left lower leg: No edema.  Skin:    General: Skin is warm and dry.     Coloration: Skin is not jaundiced or pale.     Findings: No rash.  Neurological:     Mental Status: She is alert.     Motor: No abnormal muscle tone.     Gait: Gait normal.  Psychiatric:        Mood and Affect: Mood normal.        Speech: Speech normal.        Behavior: Behavior normal.        PHQ2/9: Depression screen [facility] 2/9 02/05/2020 06/23/2019 03/29/2019 02/16/2019 12/08/2018  Decreased Interest 0 0 0 0 0  Down, Depressed, Hopeless 0 0 0 0 0  PHQ - 2 Score 0 0 0 0 0  Altered sleeping 0 0 0 0 0  Tired, decreased energy 0 0 0 0 0  Change in appetite 0 0 0 0 0  Feeling bad or failure about yourself  0 0 0 0 0  Trouble concentrating 0 0 0 0 0  Moving slowly or fidgety/restless 0 0 0 0 0  Suicidal thoughts 0 0 0 0 0  PHQ-9 Score 0 0 0 0 0  Difficult doing work/chores Not difficult at all Not difficult at all Not difficult at all Not difficult at all Not difficult at all    phq 9 is neg, reviewed today  Fall Risk: Fall Risk  02/05/2020 06/23/2019 03/29/2019 02/16/2019 12/08/2018  Falls in the past year? 0 0 0 0 0  Number falls in past yr: 0 0 0 0 -  Injury with Fall? 0 0 0 0 -  Follow up - Falls evaluation completed - Falls prevention discussed -    Functional Status Survey: Is the patient deaf or have difficulty hearing?: No Does the patient have difficulty seeing, even when wearing glasses/contacts?: No Does the patient have difficulty concentrating, remembering, or making decisions?: No Does the patient have difficulty walking or climbing stairs?: No Does the patient have difficulty dressing or bathing?: No Does the patient have difficulty doing errands alone such as  visiting a doctor's office or shopping?: No   Assessment & Plan:   1. Essential hypertension Well-controlled blood pressure, stable, good med compliance, no side effects or concerns on Norvasc 5 mg daily, recheck renal function, meds refilled today - COMPLETE METABOLIC PANEL WITH GFR  2. Mixed hyperlipidemia Compliant with meds, no SE, no myalgias, fatigue or jaundice Due for lipid pane; and recheck CMP-labs were about 1 year ago and were fairly well controlled Meds refilled - COMPLETE METABOLIC PANEL WITH GFR - Lipid panel  3. Tobacco abuse Patient has been cutting back is currently smoking about 7  cigarettes a day she is trying to quit, aware of smoking cessation, refused any additional help today that was offered.  She does understand the health risks of smoking and verbalized understanding of benefits if she can quit smoking. Discussed med options and local supportive systems and offered assistance at our office if she ever is willing spent more than 3 minutes discussing today  4. Coronary artery disease involving native coronary artery of native heart, angina presence unspecified Currently no exertional symptoms she states that she saw cardiology a while ago but has not followed with them regularly.  He has no concerning cardiac symptoms today and is compliant with all of her medications.  We discussed concerning signs and symptoms which she should seek immediate follow-up for either with [place] or with cardiology and she verbalized understanding  5. PAD (peripheral artery disease) ([place]) Per vascular surgery she has an appointment in about 3 weeks, no current worsening PAD symptoms  6. Anticoagulated on Coumadin This year she has been managing her Coumadin with vascular specialist.  She has not had a PT/INR since March but does have an upcoming appointment so we did not check for her today although I did offer. I discussed with her that we could manage her Coumadin and PT/INR monitoring through primary care if the co-pays for specialist visits are too expensive patient states that she will discuss it with the vascular specialist - CBC with Differential/Platelet  7. History of gout No recent flares -she has used cherry juice which has been effective for symptoms in the past.  Not currently on preventative meds  8. Medication monitoring encounter - CBC with Differential/Platelet - COMPLETE METABOLIC PANEL WITH GFR - Lipid panel   Return in about 6 months (around 08/06/2020) for routine f/up.   Delsa Grana, PA-C 02/05/20 11:47 AM

## 2020-02-20 ENCOUNTER — Ambulatory Visit (INDEPENDENT_AMBULATORY_CARE_PROVIDER_SITE_OTHER): Payer: Medicare HMO

## 2020-02-20 ENCOUNTER — Other Ambulatory Visit: Payer: Self-pay

## 2020-02-20 VITALS — BP 132/72 | HR 94 | Temp 97.1°F | Resp 16 | Ht 66.0 in | Wt 133.1 lb

## 2020-02-20 DIAGNOSIS — Z780 Asymptomatic menopausal state: Secondary | ICD-10-CM | POA: Diagnosis not present

## 2020-02-20 DIAGNOSIS — Z0000 Encounter for general adult medical examination without abnormal findings: Secondary | ICD-10-CM | POA: Diagnosis not present

## 2020-02-20 NOTE — Patient Instructions (Signed)
[name] , Thank you for taking time to come for your Medicare Wellness Visit. I appreciate your ongoing commitment to your health goals. Please review the following plan we discussed and let me know if I can assist you in the future.   Screening recommendations/referrals: Colonoscopy: done 07/06/18. Due 07/06/20 Mammogram: done 06/26/19 Bone Density: due. Please call [phone] to schedule your bone density screening.  Recommended yearly ophthalmology/optometry visit for glaucoma screening and checkup Recommended yearly dental visit for hygiene and checkup  Vaccinations: Influenza vaccine: postponed Pneumococcal vaccine: postponed Tdap vaccine: done 09/27/17 Shingles vaccine: Shingrix discussed. Please contact your pharmacy for coverage information.  Covid-19:please bring your vaccination record with you to your next appt.   Advanced directives: Advance directive discussed with you today. I have provided a copy for you to complete at home and have notarized. Once this is complete please bring a copy in to our office so we can scan it into your chart.  Conditions/risks identified: If you wish to quit smoking, help is available. For free tobacco cessation program offerings call the [facility] at 281-363-8303 or Live Well Line at (940)439-5048. You may also visit [email or web address].com or email [email or web address] .[email or web address] for more information on other programs.    Next appointment: Follow up in one year for your annual wellness visit    Preventive Care 65 Years and Older, Female Preventive care refers to lifestyle choices and visits with your health care provider that can promote health and wellness. What does preventive care include?  A yearly physical exam. This is also called an annual well check.  Dental exams once or twice a year.  Routine eye exams. Ask your health care provider how often you should have your eyes checked.  Personal lifestyle choices,  including:  Daily care of your teeth and gums.  Regular physical activity.  Eating a healthy diet.  Avoiding tobacco and drug use.  Limiting alcohol use.  Practicing safe sex.  Taking low-dose aspirin every day.  Taking vitamin and mineral supplements as recommended by your health care provider. What happens during an annual well check? The services and screenings done by your health care provider during your annual well check will depend on your age, overall health, lifestyle risk factors, and family history of disease. Counseling  Your health care provider may ask you questions about your:  Alcohol use.  Tobacco use.  Drug use.  Emotional well-being.  Home and relationship well-being.  Sexual activity.  Eating habits.  History of falls.  Memory and ability to understand (cognition).  Work and work Statistician.  Reproductive health. Screening  You may have the following tests or measurements:  Height, weight, and BMI.  Blood pressure.  Lipid and cholesterol levels. These may be checked every 5 years, or more frequently if you are over 43 years old.  Skin check.  Lung cancer screening. You may have this screening every year starting at age 80 if you have a 30-pack-year history of smoking and currently smoke or have quit within the past 15 years.  Fecal occult blood test (FOBT) of the stool. You may have this test every year starting at age 26.  Flexible sigmoidoscopy or colonoscopy. You may have a sigmoidoscopy every 5 years or a colonoscopy every 10 years starting at age 78.  Hepatitis C blood test.  Hepatitis B blood test.  Sexually transmitted disease (STD) testing.  Diabetes screening. This is done by checking your blood sugar (glucose) after you have not eaten for  a while (fasting). You may have this done every 1-3 years.  Bone density scan. This is done to screen for osteoporosis. You may have this done starting at age 64.  Mammogram. This  may be done every 1-2 years. Talk to your health care provider about how often you should have regular mammograms. Talk with your health care provider about your test results, treatment options, and if necessary, the need for more tests. Vaccines  Your health care provider may recommend certain vaccines, such as:  Influenza vaccine. This is recommended every year.  Tetanus, diphtheria, and acellular pertussis (Tdap, Td) vaccine. You may need a Td booster every 10 years.  Zoster vaccine. You may need this after age 68.  Pneumococcal 13-valent conjugate (PCV13) vaccine. One dose is recommended after age 10.  Pneumococcal polysaccharide (PPSV23) vaccine. One dose is recommended after age 23. Talk to your health care provider about which screenings and vaccines you need and how often you need them. This information is not intended to replace advice given to you by your health care provider. Make sure you discuss any questions you have with your health care provider. Document Released: 09/13/2015 Document Revised: 05/06/2016 Document Reviewed: 06/18/2015 Elsevier Interactive Patient Education  2017 [employer] Prevention in the Home Falls can cause injuries. They can happen to people of all ages. There are many things you can do to make your home safe and to help prevent falls. What can I do on the outside of my home?  Regularly fix the edges of walkways and driveways and fix any cracks.  Remove anything that might make you trip as you walk through a door, such as a raised step or threshold.  Trim any bushes or trees on the path to your home.  Use bright outdoor lighting.  Clear any walking paths of anything that might make someone trip, such as rocks or tools.  Regularly check to see if handrails are loose or broken. Make sure that both sides of any steps have handrails.  Any raised decks and porches should have guardrails on the edges.  Have any leaves, snow, or ice cleared  regularly.  Use sand or salt on walking paths during winter.  Clean up any spills in your garage right away. This includes oil or grease spills. What can I do in the bathroom?  Use night lights.  Install grab bars by the toilet and in the tub and shower. Do not use towel bars as grab bars.  Use non-skid mats or decals in the tub or shower.  If you need to sit down in the shower, use a plastic, non-slip stool.  Keep the floor dry. Clean up any water that spills on the floor as soon as it happens.  Remove soap buildup in the tub or shower regularly.  Attach bath mats securely with double-sided non-slip rug tape.  Do not have throw rugs and other things on the floor that can make you trip. What can I do in the bedroom?  Use night lights.  Make sure that you have a light by your bed that is easy to reach.  Do not use any sheets or blankets that are too big for your bed. They should not hang down onto the floor.  Have a firm chair that has side arms. You can use this for support while you get dressed.  Do not have throw rugs and other things on the floor that can make you trip. What can I do in  the kitchen?  Clean up any spills right away.  Avoid walking on wet floors.  Keep items that you use a lot in easy-to-reach places.  If you need to reach something above you, use a strong step stool that has a grab bar.  Keep electrical cords out of the way.  Do not use floor polish or wax that makes floors slippery. If you must use wax, use non-skid floor wax.  Do not have throw rugs and other things on the floor that can make you trip. What can I do with my stairs?  Do not leave any items on the stairs.  Make sure that there are handrails on both sides of the stairs and use them. Fix handrails that are broken or loose. Make sure that handrails are as long as the stairways.  Check any carpeting to make sure that it is firmly attached to the stairs. Fix any carpet that is loose  or worn.  Avoid having throw rugs at the top or bottom of the stairs. If you do have throw rugs, attach them to the floor with carpet tape.  Make sure that you have a light switch at the top of the stairs and the bottom of the stairs. If you do not have them, ask someone to add them for you. What else can I do to help prevent falls?  Wear shoes that:  Do not have high heels.  Have rubber bottoms.  Are comfortable and fit you well.  Are closed at the toe. Do not wear sandals.  If you use a stepladder:  Make sure that it is fully opened. Do not climb a closed stepladder.  Make sure that both sides of the stepladder are locked into place.  Ask someone to hold it for you, if possible.  Clearly mark and make sure that you can see:  Any grab bars or handrails.  First and last steps.  Where the edge of each step is.  Use tools that help you move around (mobility aids) if they are needed. These include:  Canes.  Walkers.  Scooters.  Crutches.  Turn on the lights when you go into a dark area. Replace any light bulbs as soon as they burn out.  Set up your furniture so you have a clear path. Avoid moving your furniture around.  If any of your floors are uneven, fix them.  If there are any pets around you, be aware of where they are.  Review your medicines with your doctor. Some medicines can make you feel dizzy. This can increase your chance of falling. Ask your doctor what other things that you can do to help prevent falls. This information is not intended to replace advice given to you by your health care provider. Make sure you discuss any questions you have with your health care provider. Document Released: 06/13/2009 Document Revised: 01/23/2016 Document Reviewed: 09/21/2014 Elsevier Interactive Patient Education  2017 [employer].

## 2020-02-20 NOTE — Progress Notes (Signed)
 Subjective:   Mary [name] is a 69 y.o. female who presents for Medicare Annual (Subsequent) preventive examination.  Review of Systems     Cardiac Risk Factors include: hypertension;dyslipidemia;smoking/ tobacco exposure;advanced age (>55men, >65 women)     Objective:    Today's Vitals   02/20/20 1018  BP: 132/72  Pulse: 94  Resp: 16  Temp: (!) 97.1 F (36.2 C)  TempSrc: Temporal  SpO2: 96%  Weight: 133 lb 1.6 oz (60.4 kg)  Height: 5' 6" (1.676 m)   Body mass index is 21.48 kg/m.  Advanced Directives 02/20/2020 02/16/2019 07/06/2018 02/10/2018 01/14/2018 01/13/2018 01/11/2018  Does Patient Have a Medical Advance Directive? No No No No No No No  Would patient like information on creating a medical advance directive? Yes (MAU/Ambulatory/Procedural Areas - Information given) No - Patient declined No - Patient declined Yes (MAU/Ambulatory/Procedural Areas - Information given) No - Patient declined - -    Current Medications (verified) Outpatient Encounter Medications as of 02/20/2020  Medication Sig  . acetaminophen (TYLENOL) 500 MG tablet Take 500 mg by mouth 2 (two) times daily as needed for moderate pain or headache.  . amLODipine (NORVASC) 5 MG tablet Take 1 tablet (5 mg total) by mouth daily.  . atorvastatin (LIPITOR) 10 MG tablet Take 1 tablet (10 mg total) by mouth daily.  . Blood Pressure Monitoring (BLOOD PRESSURE KIT) DEVI 1 each by Does not apply route 2 (two) times daily. Fluctuating blood pressures, DX I10  . cholecalciferol (VITAMIN D3) 25 MCG (1000 UT) tablet Take 1,000 Units by mouth every other day.  . potassium chloride (KLOR-CON 10) 10 MEQ tablet Take 1 tablet (10 mEq total) by mouth 2 (two) times daily.  . vitamin E 400 UNIT capsule Take 400 Units by mouth daily.  . warfarin (COUMADIN) 5 MG tablet Take as directed   No facility-administered encounter medications on file as of 02/20/2020.    Allergies (verified) Patient has no known allergies.    History: Past Medical History:  Diagnosis Date  . Arthritis   . Hyperlipidemia   . Hypertension   . Hyperuricemia   . Osteoporosis   . PAD (peripheral artery disease) (HCC) 02/2010   angiplasty, popliteal artery  . Tobacco abuse    Past Surgical History:  Procedure Laterality Date  . ANGIOPLASTY  July 2011   PAD- popliteal artery  . COLONOSCOPY WITH PROPOFOL N/A 01/13/2018   Procedure: COLONOSCOPY WITH PROPOFOL;  Surgeon: Vanga, Rohini Reddy, MD;  Location: [facility] ENDOSCOPY;  Service: Gastroenterology;  Laterality: N/A;  . COLONOSCOPY WITH PROPOFOL N/A 01/14/2018   Procedure: COLONOSCOPY WITH PROPOFOL;  Surgeon: Tahiliani, Varnita B, MD;  Location: [facility] ENDOSCOPY;  Service: Endoscopy;  Laterality: N/A;  . COLONOSCOPY WITH PROPOFOL N/A 07/06/2018   Procedure: COLONOSCOPY WITH PROPOFOL;  Surgeon: Tahiliani, Varnita B, MD;  Location: [facility] ENDOSCOPY;  Service: Endoscopy;  Laterality: N/A;  . CORONARY ANGIOPLASTY    . JOINT REPLACEMENT    . Leg Bypass Surgery Right 08/2010  . OVARIAN CYST SURGERY  1980s  . POPLITEAL ARTERY STENT Right 05/2010  . POPLITEAL ARTERY STENT Right 07/2010  . TOTAL HIP ARTHROPLASTY Left 10/27/2017   Procedure: TOTAL HIP ARTHROPLASTY;  Surgeon: Miller, Howard, MD;  Location: [facility] ORS;  Service: Orthopedics;  Laterality: Left;   Family History  Problem Relation Age of Onset  . Arthritis Mother   . Diabetes Mother   . Stroke Mother   . Hypertension Mother   . Dementia Mother   . Cancer Father   .   Arthritis Sister   . Seizures Sister   . Diabetes Sister   . Hypertension Sister   . COPD Sister   . Cancer Brother   . Stroke Maternal Grandmother   . Diabetes Paternal Grandmother   . Cancer Sister   . Diabetes Sister   . Arthritis Sister   . Diabetes Brother   . Stroke Brother   . Healthy Brother   . Diabetes Brother   . Stroke Brother   . Cancer Brother   . Heart disease Neg Hx   . Breast cancer Neg Hx    Social History   Socioeconomic History  .  Marital status: Married    Spouse name: Joien  . Number of children: 0  . Years of education: Not on file  . Highest education level: 12th grade  Occupational History  . Occupation: Disabled  Tobacco Use  . Smoking status: Current Every Day Smoker    Packs/day: 0.50    Years: 40.00    Pack years: 20.00    Types: Cigarettes  . Smokeless tobacco: Never Used  Vaping Use  . Vaping Use: Never used  Substance and Sexual Activity  . Alcohol use: No  . Drug use: No  . Sexual activity: Yes  Other Topics Concern  . Not on file  Social History Narrative  . Not on file   Social Determinants of Health   Financial Resource Strain: Low Risk   . Difficulty of Paying Living Expenses: Not very hard  Food Insecurity: No Food Insecurity  . Worried About Charity fundraiser in the Last Year: Never true  . Ran Out of Food in the Last Year: Never true  Transportation Needs: No Transportation Needs  . Lack of Transportation (Medical): No  . Lack of Transportation (Non-Medical): No  Physical Activity: Sufficiently Active  . Days of Exercise per Week: 7 days  . Minutes of Exercise per Session: 30 min  Stress: No Stress Concern Present  . Feeling of Stress : Not at all  Social Connections: Moderately Isolated  . Frequency of Communication with Friends and Family: More than three times a week  . Frequency of Social Gatherings with Friends and Family: Twice a week  . Attends Religious Services: Never  . Active Member of Clubs or Organizations: No  . Attends Archivist Meetings: Never  . Marital Status: Married    Tobacco Counseling Ready to quit: No Counseling given: Not Answered   Clinical Intake:  Pre-visit preparation completed: Yes  Pain : No/denies pain     BMI - recorded: 21.48 Nutritional Status: BMI of 19-24  Normal Nutritional Risks: None Diabetes: No  How often do you need to have someone help you when you read instructions, pamphlets, or other written  materials from your doctor or pharmacy?: 1 - Never    Interpreter Needed?: No  Information entered by :: Clemetine Marker LPN   Activities of Daily Living In your present state of health, do you have any difficulty performing the following activities: 02/20/2020 02/05/2020  Hearing? N N  Comment declines hearing aids -  Vision? N N  Difficulty concentrating or making decisions? N N  Walking or climbing stairs? N N  Dressing or bathing? N N  Doing errands, shopping? N N  Preparing Food and eating ? N -  Using the Toilet? N -  In the past six months, have you accidently leaked urine? N -  Do you have problems with loss of bowel control? N -  Managing your Medications? N -  Managing your Finances? N -  Housekeeping or managing your Housekeeping? N -  Some recent data might be hidden    Patient Care Team: Delsa Grana, PA-C as PCP - General (Family Medicine) Yolonda Kida, MD as Consulting Physician (Cardiology) Lucky Cowboy Erskine Squibb, MD as Referring Physician (Vascular Surgery)  Indicate any recent Medical Services you may have received from other than [facility] providers in the past year (date may be approximate).     Assessment:   This is a routine wellness examination for Shamarie.  Hearing/Vision screen  Hearing Screening   125Hz 250Hz 500Hz 1000Hz 2000Hz 3000Hz 4000Hz 6000Hz 8000Hz  Right ear:           Left ear:           Comments: Pt denies hearing difficulty  Vision Screening Comments: Past due for eye exam. Established with [facility] in [place] issues and exercise activities discussed: Current Exercise Habits: Home exercise routine, Type of exercise: walking, Time (Minutes): 30, Frequency (Times/Week): 7, Weekly Exercise (Minutes/Week): 210, Intensity: Mild, Exercise limited by: None identified  Goals    . DIET - INCREASE WATER INTAKE     Recommend to drink at least 6-8 8oz glasses of water per day.      Depression Screen PHQ 2/9 Scores 02/20/2020 02/05/2020  06/23/2019 03/29/2019 02/16/2019 12/08/2018 06/06/2018  PHQ - 2 Score 0 0 0 0 0 0 0  PHQ- 9 Score - 0 0 0 0 0 -    Fall Risk Fall Risk  02/20/2020 02/05/2020 06/23/2019 03/29/2019 02/16/2019  Falls in the past year? 0 0 0 0 0  Number falls in past yr: 0 0 0 0 0  Injury with Fall? 0 0 0 0 0  Risk for fall due to : No Fall Risks - - - -  Follow up Falls prevention discussed - Falls evaluation completed - Falls prevention discussed    Any stairs in or around the home? Yes  If so, are there any without handrails? Yes  Home free of loose throw rugs in walkways, pet beds, electrical cords, etc? Yes  Adequate lighting in your home to reduce risk of falls? Yes   ASSISTIVE DEVICES UTILIZED TO PREVENT FALLS:  Life alert? No  Use of a cane, walker or w/c? No  Grab bars in the bathroom? Yes  Shower chair or bench in shower? No  Elevated toilet seat or a handicapped toilet? No   TIMED UP AND GO:  Was the test performed? Yes .  Length of time to ambulate 10 feet: 5 sec.   Gait steady and fast without use of assistive device  Cognitive Function:     6CIT Screen 02/20/2020 02/16/2019 02/10/2018  What Year? 0 points 0 points 0 points  What month? 0 points 0 points 0 points  What time? 0 points 0 points 0 points  Count back from 20 0 points 0 points 0 points  Months in reverse 0 points 0 points 2 points  Repeat phrase 0 points 0 points 2 points  Total Score 0 0 4    Immunizations Immunization History  Administered Date(s) Administered  . Tdap 09/27/2017    TDAP status: Up to date   Flu Vaccine status: Declined, Education has been provided regarding the importance of this vaccine but patient still declined. Advised may receive this vaccine at local pharmacy or Health Dept. Aware to provide a copy of the vaccination record if obtained from local pharmacy or  Health Dept. Verbalized acceptance and understanding.   Pneumococcal vaccine status: Declined,  Education has been provided regarding the  importance of this vaccine but patient still declined. Advised may receive this vaccine at local pharmacy or Health Dept. Aware to provide a copy of the vaccination record if obtained from local pharmacy or Health Dept. Verbalized acceptance and understanding.    Covid-19 vaccine status: Completed vaccines. Pt advised to bring vaccine record to next appt.   Qualifies for Shingles Vaccine? Yes   Zostavax completed No   Shingrix Completed?: No.    Education has been provided regarding the importance of this vaccine. Patient has been advised to call insurance company to determine out of pocket expense if they have not yet received this vaccine. Advised may also receive vaccine at local pharmacy or Health Dept. Verbalized acceptance and understanding.  Screening Tests Health Maintenance  Topic Date Due  . COVID-19 Vaccine (1) Never done  . PNA vac Low Risk Adult (1 of 2 - PCV13) 06/22/2020 (Originally 04/07/2018)  . DEXA SCAN  02/04/2021 (Originally 04/07/2018)  . INFLUENZA VACCINE  03/31/2020  . MAMMOGRAM  06/25/2020  . COLONOSCOPY  07/06/2020  . TETANUS/TDAP  09/28/2027  . Hepatitis C Screening  Completed    Health Maintenance  Health Maintenance Due  Topic Date Due  . COVID-19 Vaccine (1) Never done    Colorectal cancer screening: Completed 07/06/18. Repeat every 2 years   Mammogram status: Completed 06/26/19. Repeat every year   Bone Density status: Ordered today . Pt provided with contact info and advised to call to schedule appt.  Lung Cancer Screening: (Low Dose CT Chest recommended if Age 73-80 years, 30 pack-year currently smoking OR have quit w/in 15years.) does not qualify.   Additional Screening:  Hepatitis C Screening: does qualify; Completed 03/29/19  Vision Screening: Recommended annual ophthalmology exams for early detection of glaucoma and other disorders of the eye. Is the patient up to date with their annual eye exam?  No  Who is the provider or what is the name of  the office in which the patient attends annual eye exams? [facility]  Dental Screening: Recommended annual dental exams for proper oral hygiene  Community Resource Referral / Chronic Care Management: CRR required this visit?  No   CCM required this visit?  No      Plan:     I have personally reviewed and noted the following in the patient's chart:   . Medical and social history . Use of alcohol, tobacco or illicit drugs  . Current medications and supplements . Functional ability and status . Nutritional status . Physical activity . Advanced directives . List of other physicians . Hospitalizations, surgeries, and ER visits in previous 12 months . Vitals . Screenings to include cognitive, depression, and falls . Referrals and appointments  In addition, I have reviewed and discussed with patient certain preventive protocols, quality metrics, and best practice recommendations. A written personalized care plan for preventive services as well as general preventive health recommendations were provided to patient.     Clemetine Marker, LPN   [id number]   Nurse Notes: none

## 2020-02-23 ENCOUNTER — Telehealth: Payer: Self-pay | Admitting: Family Medicine

## 2020-02-23 NOTE — Telephone Encounter (Signed)
Patient is calling to report that she will start using [facility]. And [employer] will submit a fax to the office.

## 2020-02-26 ENCOUNTER — Ambulatory Visit (INDEPENDENT_AMBULATORY_CARE_PROVIDER_SITE_OTHER): Payer: Medicare HMO | Admitting: Vascular Surgery

## 2020-02-26 ENCOUNTER — Encounter (INDEPENDENT_AMBULATORY_CARE_PROVIDER_SITE_OTHER): Payer: Medicare HMO

## 2020-03-05 ENCOUNTER — Other Ambulatory Visit: Payer: Self-pay

## 2020-03-05 DIAGNOSIS — I10 Essential (primary) hypertension: Secondary | ICD-10-CM

## 2020-03-05 DIAGNOSIS — E782 Mixed hyperlipidemia: Secondary | ICD-10-CM

## 2020-03-05 DIAGNOSIS — I2510 Atherosclerotic heart disease of native coronary artery without angina pectoris: Secondary | ICD-10-CM

## 2020-03-05 DIAGNOSIS — I739 Peripheral vascular disease, unspecified: Secondary | ICD-10-CM

## 2020-03-05 MED ORDER — AMLODIPINE BESYLATE 5 MG PO TABS: 5.0000 mg | ORAL_TABLET | Freq: Every day | ORAL | 3 refills | Status: DC

## 2020-03-05 MED ORDER — ATORVASTATIN CALCIUM 10 MG PO TABS: 10.0000 mg | ORAL_TABLET | Freq: Every day | ORAL | 3 refills | Status: DC

## 2020-03-21 ENCOUNTER — Ambulatory Visit (INDEPENDENT_AMBULATORY_CARE_PROVIDER_SITE_OTHER): Payer: Medicare HMO | Admitting: Vascular Surgery

## 2020-03-21 ENCOUNTER — Encounter (INDEPENDENT_AMBULATORY_CARE_PROVIDER_SITE_OTHER): Payer: Medicare HMO

## 2020-03-29 ENCOUNTER — Encounter: Payer: Medicare HMO | Admitting: Family Medicine

## 2020-04-15 ENCOUNTER — Encounter (INDEPENDENT_AMBULATORY_CARE_PROVIDER_SITE_OTHER): Payer: Self-pay | Admitting: Vascular Surgery

## 2020-04-15 ENCOUNTER — Other Ambulatory Visit (INDEPENDENT_AMBULATORY_CARE_PROVIDER_SITE_OTHER): Payer: Self-pay | Admitting: Nurse Practitioner

## 2020-04-15 ENCOUNTER — Ambulatory Visit (INDEPENDENT_AMBULATORY_CARE_PROVIDER_SITE_OTHER): Payer: Medicare HMO | Admitting: Vascular Surgery

## 2020-04-15 ENCOUNTER — Ambulatory Visit (INDEPENDENT_AMBULATORY_CARE_PROVIDER_SITE_OTHER): Payer: Medicare HMO

## 2020-04-15 ENCOUNTER — Other Ambulatory Visit: Payer: Self-pay

## 2020-04-15 VITALS — BP 144/78 | HR 90 | Ht 66.0 in | Wt 127.0 lb

## 2020-04-15 DIAGNOSIS — I739 Peripheral vascular disease, unspecified: Secondary | ICD-10-CM

## 2020-04-15 DIAGNOSIS — I2510 Atherosclerotic heart disease of native coronary artery without angina pectoris: Secondary | ICD-10-CM | POA: Diagnosis not present

## 2020-04-15 DIAGNOSIS — E782 Mixed hyperlipidemia: Secondary | ICD-10-CM

## 2020-04-15 DIAGNOSIS — I10 Essential (primary) hypertension: Secondary | ICD-10-CM

## 2020-04-15 NOTE — Progress Notes (Signed)
MRN : [id number]  Mary [name] is a 69 y.o. (28-Nov-1950) female who presents with chief complaint of  Chief Complaint  Patient presents with  . Follow-up    U/S folllow up  .  History of Present Illness:   The patient returns to our office today for follow-up studies related to her peripheral arterial disease.  The patient's last intervention was in 2011 with SFA to popliteal stents.  The patient was placed on Coumadin for management however the patient was lost to follow-up for some time.  Prior to the last appointment the patient had been seen in our office since June 2018 and had not had her INR checked in well over a year.  Refills were done by her PCP and they may have had INR checks there however we do not have any record.  Since her last visit the patient has been compliant with getting her INR checked regularly.  Today her INR came back at 2 which is within therapeutic levels.  The patient denies any worsening of claudication-like symptoms.  The patient continues to have numbness of her right lower extremity following her previous intervention.  She denies any numbness of her left lower extremity.  She denies any rest pain or wounds on her bilateral lower extremities.  The patient continues to smoke on a daily basis.  ABI's Rt=0.59 and LT=0.51 (Previous ABI Rt=0.52 and Lt=0.47.)  Previous imaging was done which show the SFA to the popliteal stents placed in 2011 are occluded.  The common femoral artery and profunda are currently patent.  The left lower extremity shows that the distal SFA is occluded however with a collateral feed.  Left common femoral artery, profunda and the proximal to mid SFA are patent.  The patient has slightly dampened toe waveforms bilaterally.  There are monophasic waveforms seen in the bilateral tibial arteries.  No outpatient medications have been marked as taking for the 04/15/20 encounter (Office Visit) with Delana Meyer, Dolores Lory, MD.    Past Medical  History:  Diagnosis Date  . Arthritis   . Hyperlipidemia   . Hypertension   . Hyperuricemia   . Osteoporosis   . PAD (peripheral artery disease) ([place]) 02/2010   angiplasty, popliteal artery  . Tobacco abuse     Past Surgical History:  Procedure Laterality Date  . ANGIOPLASTY  July 2011   PAD- popliteal artery  . COLONOSCOPY WITH PROPOFOL N/A 01/13/2018   Procedure: COLONOSCOPY WITH PROPOFOL;  Surgeon: Lin Landsman, MD;  Location: [facility] ENDOSCOPY;  Service: Gastroenterology;  Laterality: N/A;  . COLONOSCOPY WITH PROPOFOL N/A 01/14/2018   Procedure: COLONOSCOPY WITH PROPOFOL;  Surgeon: Virgel Manifold, MD;  Location: [facility] ENDOSCOPY;  Service: Endoscopy;  Laterality: N/A;  . COLONOSCOPY WITH PROPOFOL N/A 07/06/2018   Procedure: COLONOSCOPY WITH PROPOFOL;  Surgeon: Virgel Manifold, MD;  Location: [facility] ENDOSCOPY;  Service: Endoscopy;  Laterality: N/A;  . CORONARY ANGIOPLASTY    . JOINT REPLACEMENT    . Leg Bypass Surgery Right 08/2010  . OVARIAN CYST SURGERY  1980s  . POPLITEAL ARTERY STENT Right 05/2010  . POPLITEAL ARTERY STENT Right 07/2010  . TOTAL HIP ARTHROPLASTY Left 10/27/2017   Procedure: TOTAL HIP ARTHROPLASTY;  Surgeon: Earnestine Leys, MD;  Location: [facility] ORS;  Service: Orthopedics;  Laterality: Left;    Social History Social History   Tobacco Use  . Smoking status: Current Every Day Smoker    Packs/day: 0.50    Years: 40.00    Pack years: 20.00  Types: Cigarettes  . Smokeless tobacco: Never Used  Vaping Use  . Vaping Use: Never used  Substance Use Topics  . Alcohol use: No  . Drug use: No    Family History Family History  Problem Relation Age of Onset  . Arthritis Mother   . Diabetes Mother   . Stroke Mother   . Hypertension Mother   . Dementia Mother   . Cancer Father   . Arthritis Sister   . Seizures Sister   . Diabetes Sister   . Hypertension Sister   . COPD Sister   . Cancer Brother   . Stroke Maternal Grandmother   . Diabetes  Paternal Grandmother   . Cancer Sister   . Diabetes Sister   . Arthritis Sister   . Diabetes Brother   . Stroke Brother   . Healthy Brother   . Diabetes Brother   . Stroke Brother   . Cancer Brother   . Heart disease Neg Hx   . Breast cancer Neg Hx     No Known Allergies   REVIEW OF SYSTEMS (Negative unless checked)  Constitutional: [] Weight loss  [] Fever  [] Chills Cardiac: [] Chest pain   [] Chest pressure   [] Palpitations   [] Shortness of breath when laying flat   [] Shortness of breath with exertion. Vascular:  [x] Pain in legs with walking   [] Pain in legs at rest  [] History of DVT   [] Phlebitis   [] Swelling in legs   [] Varicose veins   [] Non-healing ulcers Pulmonary:   [] Uses home oxygen   [] Productive cough   [] Hemoptysis   [] Wheeze  [] COPD   [] Asthma Neurologic:  [] Dizziness   [] Seizures   [] History of stroke   [] History of TIA  [] Aphasia   [] Vissual changes   [] Weakness or numbness in arm   [] Weakness or numbness in leg Musculoskeletal:   [] Joint swelling   [x] Joint pain   [] Low back pain Hematologic:  [] Easy bruising  [] Easy bleeding   [] Hypercoagulable state   [] Anemic Gastrointestinal:  [] Diarrhea   [] Vomiting  [] Gastroesophageal reflux/heartburn   [] Difficulty swallowing. Genitourinary:  [] Chronic kidney disease   [] Difficult urination  [] Frequent urination   [] Blood in urine Skin:  [] Rashes   [] Ulcers  Psychological:  [] History of anxiety   []  History of major depression.  Physical Examination  Vitals:   04/15/20 1027  BP: (!) 144/78  Pulse: 90  Weight: 127 lb (57.6 kg)  Height: 5\' 6"  (1.676 m)   Body mass index is 20.5 kg/m. Gen: WD/WN, NAD Head: [place]/AT, No temporalis wasting.  Ear/Nose/Throat: Hearing grossly intact, nares w/o erythema or drainage Eyes: PER, EOMI, sclera nonicteric.  Neck: Supple, no large masses.   Pulmonary:  Good air movement, no audible wheezing bilaterally, no use of accessory muscles.  Cardiac: RRR, no JVD Vascular:  Vessel Right Left   Radial Palpable Palpable  PT Not Palpable Not Palpable  DP Not Palpable Not Palpable  Gastrointestinal: Non-distended. No guarding/no peritoneal signs.  Musculoskeletal: M/S 5/5 throughout.  No deformity or atrophy.  Neurologic: CN 2-12 intact. Symmetrical.  Speech is fluent. Motor exam as listed above. Psychiatric: Judgment intact, Mood & affect appropriate for pt's clinical situation. Dermatologic: No rashes or ulcers noted.  No changes consistent with cellulitis.   CBC Lab Results  Component Value Date   WBC 7.6 02/05/2020   HGB 13.9 02/05/2020   HCT 41.7 02/05/2020   MCV 89.9 02/05/2020   PLT 313 02/05/2020    BMET    Component Value Date/Time  NA 141 02/05/2020 1213   NA 142 10/17/2015 1017   NA 138 01/21/2012 0740   K 3.7 02/05/2020 1213   K 2.8 (L) 01/21/2012 0740   CL 104 02/05/2020 1213   CL 98 01/21/2012 0740   CO2 31 02/05/2020 1213   CO2 30 01/21/2012 0740   GLUCOSE 80 02/05/2020 1213   GLUCOSE 117 (H) 01/21/2012 0740   BUN 6 (L) 02/05/2020 1213   BUN 6 (L) 10/17/2015 1017   BUN 9 01/21/2012 0740   CREATININE 0.81 02/05/2020 1213   CALCIUM 9.7 02/05/2020 1213   CALCIUM 9.3 01/21/2012 0740   GFRNONAA 76 02/05/2020 1213   GFRAA 88 02/05/2020 1213   CrCl cannot be calculated (Patient's most recent lab result is older than the maximum 21 days allowed.).  COAG Lab Results  Component Value Date   INR 2.0 (H) 11/22/2019   INR 1.7 (H) 11/15/2019   INR 1.4 (H) 11/06/2019    Radiology No results found.    Assessment/Plan 1. PAD (peripheral artery disease) (HCC)  Recommend:  The patient has evidence of atherosclerosis of the lower extremities with claudication.  The patient does not voice lifestyle limiting changes at this point in time.  Noninvasive studies do not suggest clinically significant change.  No invasive studies, angiography or surgery at this time The patient should continue walking and begin a more formal exercise program.  The  patient should continue antiplatelet therapy and aggressive treatment of the lipid abnormalities  No changes in the patient's medications at this time  - VAS [place] ABI WITH/WO TBI; Future  2. Essential hypertension Continue antihypertensive medications as already ordered, these medications have been reviewed and there are no changes at this time.   3. Coronary artery disease involving native coronary artery of native heart, angina presence unspecified Continue cardiac and antihypertensive medications as already ordered and reviewed, no changes at this time.  Continue statin as ordered and reviewed, no changes at this time  Nitrates PRN for chest pain   4. Mixed hyperlipidemia Continue statin as ordered and reviewed, no changes at this time    Hortencia Pilar, MD  04/15/2020 10:30 AM

## 2020-04-16 ENCOUNTER — Encounter (INDEPENDENT_AMBULATORY_CARE_PROVIDER_SITE_OTHER): Payer: Self-pay | Admitting: Vascular Surgery

## 2020-04-25 ENCOUNTER — Telehealth: Payer: Self-pay | Admitting: *Deleted

## 2020-04-25 NOTE — Chronic Care Management (AMB) (Signed)
  Chronic Care Management   Outreach Note  04/25/2020 Name: Mary [name] MRN: [id number] DOB: [birth year]  [name] is a 69 y.o. year old female who is a primary care patient of Delsa Grana, [place]. I reached out to [name] by phone today in response to a referral sent by Ms. Dede Query Garn's health plan.     An unsuccessful telephone outreach was attempted today. The patient was referred to the case management team for assistance with care management and care coordination.   Follow Up Plan: The care management team will reach out to the patient again over the next 7 days. If patient returns call to provider office, please advise to call [facility] at [phone].  [place], [place] [address] Direct Dial: [phone] [email or web address]: [email or web address]

## 2020-05-02 NOTE — Chronic Care Management (AMB) (Signed)
  Chronic Care Management   Note  05/02/2020 Name: Mary [name] MRN: [id number] DOB: Sep 13, 1950  Mary [name] is a 69 y.o. year old female who is a primary care patient of Delsa Grana, [place]. I reached out to Mary [name] by phone today in response to a referral sent by Ms. Dede Query [place]'s health plan.     [name] was given information about Chronic Care Management services today including:  1. CCM service includes personalized support from designated clinical staff supervised by her physician, including individualized plan of care and coordination with other care providers 2. 24/7 contact phone numbers for assistance for urgent and routine care needs. 3. Service will only be billed when office clinical staff spend 20 minutes or more in a month to coordinate care. 4. Only one practitioner may furnish and bill the service in a calendar month. 5. The patient may stop CCM services at any time (effective at the end of the month) by phone call to the office staff. 6. The patient will be responsible for cost sharing (co-pay) of up to 20% of the service fee (after annual deductible is met).  Patient agreed to services and verbal consent obtained.   Follow up plan: Telephone appointment with care management team member scheduled for: 05/15/2020  [facility]

## 2020-05-15 ENCOUNTER — Other Ambulatory Visit: Payer: Self-pay

## 2020-05-15 ENCOUNTER — Ambulatory Visit: Payer: Medicare HMO | Admitting: Pharmacist

## 2020-05-15 DIAGNOSIS — E782 Mixed hyperlipidemia: Secondary | ICD-10-CM

## 2020-05-15 DIAGNOSIS — I10 Essential (primary) hypertension: Secondary | ICD-10-CM

## 2020-05-15 NOTE — Chronic Care Management (AMB) (Signed)
Chronic Care Management Pharmacy  Name: Mary [name]  MRN: [id number] DOB: 02-06-1951  Chief Complaint/ HPI  Mary [name],  69 y.o. , female presents for their Initial CCM visit with the clinical pharmacist via telephone due to COVID-19 Pandemic.  PCP : Delsa Grana, PA-C  Their chronic conditions include: DM, HTN, HLD  Office Visits: 6/7 HTN, [place], BP 132/80 P 90 Wt 132 BMI 21.3, 7 cig/day  Consult Visit: 8/16 PAD, [place], BP 144/78 P 90 Wt 127 BMI 20.5, Coumadin mgmt, no surgery  Medications: Outpatient Encounter Medications as of 05/15/2020  Medication Sig  . acetaminophen (TYLENOL) 500 MG tablet Take 500 mg by mouth 2 (two) times daily as needed for moderate pain or headache.  Marland Kitchen amLODipine (NORVASC) 5 MG tablet Take 1 tablet (5 mg total) by mouth daily. (Patient taking differently: Take 5 mg by mouth in the morning. )  . atorvastatin (LIPITOR) 10 MG tablet Take 1 tablet (10 mg total) by mouth daily. (Patient taking differently: Take 10 mg by mouth in the morning. )  . Blood Pressure Monitoring (BLOOD PRESSURE KIT) DEVI 1 each by Does not apply route 2 (two) times daily. Fluctuating blood pressures, DX I10  . cholecalciferol (VITAMIN D3) 25 MCG (1000 UT) tablet Take 1,000 Units by mouth every other day.  . potassium chloride (KLOR-CON 10) 10 MEQ tablet Take 1 tablet (10 mEq total) by mouth 2 (two) times daily.  . vitamin E 400 UNIT capsule Take 180 Units by mouth daily.   Marland Kitchen warfarin (COUMADIN) 5 MG tablet Take as directed (Patient taking differently: Take 1 tab alternating daily with 1/2 tab)   No facility-administered encounter medications on file as of 05/15/2020.      Financial Resource Strain: Low Risk   . Difficulty of Paying Living Expenses: Not very hard    Current Diagnosis/Assessment:  Goals Addressed            This Visit's Progress   . Chronic Care Management       CARE PLAN ENTRY (see longitudinal plan of care for additional care plan  information)  Current Barriers:  . Chronic Disease Management support, education, and care coordination needs related to Hypertension, Hyperlipidemia, and Tobacco use   Hypertension BP Readings from Last 3 Encounters:  04/15/20 (!) 144/78  02/20/20 132/72  02/05/20 132/80   . Pharmacist Clinical Goal(s): o Over the next 90 days, patient will work with PharmD and providers to maintain BP goal <140/90 . Current regimen:  o Amlodipine  . Interventions: o None . Patient self care activities - Over the next 90 days, patient will: o Check BP weekly, document, and provide at future appointments o Ensure daily salt intake < 2300 mg/day  Hyperlipidemia Lab Results  Component Value Date/Time   LDLCALC 67 02/05/2020 12:13 PM   . Pharmacist Clinical Goal(s): o Over the next 90 days, patient will work with PharmD and providers to maintain LDL goal < 70 . Current regimen:  o Lipitor 55m daily . Interventions: o None . Patient self care activities - Over the next 90 days, patient will: o Continue to take atorvastatin 18mdaily  Smoking . Pharmacist Clinical Goal(s) o Over the next 90 days, patient will work with PharmD and providers to stop smoking . Current regimen:  o 7 cigarettes daily . Interventions: o Recommend [place] at [place] Patient self care activities - Over the next 90 days, patient will: o Work with Qu[place] Minor[place] stop smoking at your pace  Medication management . Pharmacist Clinical Goal(s): o Over the next 90 days, patient will work with PharmD and providers to maintain optimal medication adherence . Current pharmacy: [facility] . Interventions o Comprehensive medication review performed. o Continue current medication management strategy . Patient self care activities - Over the next 90 days, patient will: o Focus on medication adherence by continuing current practices o Take medications as prescribed o Report any questions or concerns to PharmD  and/or provider(s)  Initial goal documentation       Hypertension   BP goal is:  <140/90  Office blood pressures are  BP Readings from Last 3 Encounters:  04/15/20 (!) 144/78  02/20/20 132/72  02/05/20 132/80   Patient checks BP at home daily Patient home BP readings are ranging: 130/79  Patient has failed these meds in the past: NA Patient is currently controlled on the following medications:  . Amlodipine 62m daily  We discussed: Typically at goal Denies hypotension Home readings match provider visit readings  Plan  Continue current medications   Hyperlipidemia   LDL goal < 70  Lipid Panel     Component Value Date/Time   CHOL 130 02/05/2020 1213   CHOL 145 10/17/2015 1017   TRIG 112 02/05/2020 1213   HDL 43 (L) 02/05/2020 1213   HDL 43 10/17/2015 1017   LDLCALC 67 02/05/2020 1213    Hepatic Function Latest Ref Rng & Units 02/05/2020 03/29/2019 02/22/2018  Total Protein 6.1 - 8.1 g/dL 7.2 7.0 7.4  Albumin 3.5 - 5.0 g/dL - - -  AST 10 - 35 U/L _0 ALT 6 - 29 U/L _1 Alk Phosphatase 38 - 126 U/L - - -  Total Bilirubin 0.2 - 1.2 mg/dL 0.4 0.3 0.3     The 10-year ASCVD risk score (Mikey BussingDC Jr., et al., 2013) is: 17.4%   Values used to calculate the score:     Age: [age]years     Sex: Female     Is Non-Hispanic African American: Yes     Diabetic: No     Tobacco smoker: Yes     Systolic Blood Pressure: 1333mmHg     Is BP treated: Yes     HDL Cholesterol: 43 mg/dL     Total Cholesterol: 130 mg/dL   Patient has failed these meds in past: NA Patient is currently controlled on the following medications:  . Lipitor 142mdaily  We discussed:  At goal Denies myalgias  Plan  Continue current medications  Tobacco Abuse   Tobacco Status:  Social History   Tobacco Use  Smoking Status Current Every Day Smoker  . Packs/day: 0.50  . Years: 40.00  . Pack years: 20.00  . Types: Cigarettes  Smokeless Tobacco Never Used    Previous quit  attempts included: NA Patient is currently uncontrolled on the following medications:  . 7 cigarettes daily  We discussed: [place]uit[place] [id number]  Plan  Smoking cessation when ready  Medication Management   [place] uses [place] medications Uses pill box? Yes Pt endorses 100% compliance  We discussed:  Tylenol twice a week  Plan  Continue current medication management strategy  Follow up: 3 month phone visit  TeMilus HeightPharmD, BCSunset ValleyCTPhoenix Medical [phone]

## 2020-05-16 NOTE — Patient Instructions (Addendum)
Visit Information  Goals Addressed            This Visit's Progress   . Chronic Care Management       CARE PLAN ENTRY (see longitudinal plan of care for additional care plan information)  Current Barriers:  . Chronic Disease Management support, education, and care coordination needs related to Hypertension, Hyperlipidemia, and Tobacco use   Hypertension BP Readings from Last 3 Encounters:  04/15/20 (!) 144/78  02/20/20 132/72  02/05/20 132/80   . Pharmacist Clinical Goal(s): o Over the next 90 days, patient will work with PharmD and providers to maintain BP goal <140/90 . Current regimen:  o Amlodipine  . Interventions: o None . Patient self care activities - Over the next 90 days, patient will: o Check BP weekly, document, and provide at future appointments o Ensure daily salt intake < 2300 mg/day  Hyperlipidemia Lab Results  Component Value Date/Time   LDLCALC 67 02/05/2020 12:13 PM   . Pharmacist Clinical Goal(s): o Over the next 90 days, patient will work with PharmD and providers to maintain LDL goal < 70 . Current regimen:  o Lipitor 10mg  daily . Interventions: o None . Patient self care activities - Over the next 90 days, patient will: o Continue to take atorvastatin 10mg  daily  Smoking . Pharmacist Clinical Goal(s) o Over the next 90 days, patient will work with PharmD and providers to stop smoking . Current regimen:  o 7 cigarettes daily . Interventions: o Recommend calling [place]uit[place] at [place] . Patient self care activities - Over the next 90 days, patient will: o Work with Guerry Minors to stop smoking at your pace  Medication management . Pharmacist Clinical Goal(s): o Over the next 90 days, patient will work with PharmD and providers to maintain optimal medication adherence . Current pharmacy: [facility] . Interventions o Comprehensive medication review performed. o Continue current medication management strategy . Patient self care activities -  Over the next 90 days, patient will: o Focus on medication adherence by continuing current practices o Take medications as prescribed o Report any questions or concerns to PharmD and/or provider(s)  Initial goal documentation        Mary [name] was given information about Chronic Care Management services today including:  1. CCM service includes personalized support from designated clinical staff supervised by her physician, including individualized plan of care and coordination with other care providers 2. 24/7 contact phone numbers for assistance for urgent and routine care needs. 3. Standard insurance, coinsurance, copays and deductibles apply for chronic care management only during months in which we provide at least 20 minutes of these services. Most insurances cover these services at 100%, however patients may be responsible for any copay, coinsurance and/or deductible if applicable. This service may help you avoid the need for more expensive face-to-face services. 4. Only one practitioner may furnish and bill the service in a calendar month. 5. The patient may stop CCM services at any time (effective at the end of the month) by phone call to the office staff.  Patient agreed to services and verbal consent obtained.   Print copy of patient instructions provided.  Telephone follow up appointment with pharmacy team member scheduled for: 3 months  Mary [name], PharmD, [place], [facility] [phone]   Steps to Quit Smoking Smoking tobacco is the leading cause of preventable death. It can affect almost every organ in the body. Smoking puts you and people around you at risk for many serious,  long-lasting (chronic) diseases. Quitting smoking can be hard, but it is one of the best things that you can do for your health. It is never too late to quit. How do I get ready to quit? When you decide to quit smoking, make a plan to help you succeed. Before  you quit:  Pick a date to quit. Set a date within the next 2 weeks to give you time to prepare.  Write down the reasons why you are quitting. Keep this list in places where you will see it often.  Tell your family, friends, and co-workers that you are quitting. Their support is important.  Talk with your doctor about the choices that may help you quit.  Find out if your health insurance will pay for these treatments.  Know the people, places, things, and activities that make you want to smoke (triggers). Avoid them. What first steps can I take to quit smoking?  Throw away all cigarettes at home, at work, and in your car.  Throw away the things that you use when you smoke, such as ashtrays and lighters.  Clean your car. Make sure to empty the ashtray.  Clean your home, including curtains and carpets. What can I do to help me quit smoking? Talk with your doctor about taking medicines and seeing a counselor at the same time. You are more likely to succeed when you do both.  If you are pregnant or breastfeeding, talk with your doctor about counseling or other ways to quit smoking. Do not take medicine to help you quit smoking unless your doctor tells you to do so. To quit smoking: Quit right away  Quit smoking totally, instead of slowly cutting back on how much you smoke over a period of time.  Go to counseling. You are more likely to quit if you go to counseling sessions regularly. Take medicine You may take medicines to help you quit. Some medicines need a prescription, and some you can buy over-the-counter. Some medicines may contain a drug called nicotine to replace the nicotine in cigarettes. Medicines may:  Help you to stop having the desire to smoke (cravings).  Help to stop the problems that come when you stop smoking (withdrawal symptoms). Your doctor may ask you to use:  Nicotine patches, gum, or lozenges.  Nicotine inhalers or sprays.  Non-nicotine medicine that is  taken by mouth. Find resources Find resources and other ways to help you quit smoking and remain smoke-free after you quit. These resources are most helpful when you use them often. They include:  Online chats with a Social worker.  Phone quitlines.  Printed Furniture conservator/restorer.  Support groups or group counseling.  Text messaging programs.  Mobile phone apps. Use apps on your mobile phone or tablet that can help you stick to your quit plan. There are many free apps for mobile phones and tablets as well as websites. Examples include Quit Guide from the [employer] and smokefree.gov  What things can I do to make it easier to quit?   Talk to your family and friends. Ask them to support and encourage you.  Call a phone quitline (1-800-QUIT-NOW), reach out to support groups, or work with a Social worker.  Ask people who smoke to not smoke around you.  Avoid places that make you want to smoke, such as: ? Bars. ? Parties. ? Smoke-break areas at work.  Spend time with people who do not smoke.  Lower the stress in your life. Stress can make you want to  smoke. Try these things to help your stress: ? Getting regular exercise. ? Doing deep-breathing exercises. ? Doing yoga. ? Meditating. ? Doing a body scan. To do this, close your eyes, focus on one area of your body at a time from head to toe. Notice which parts of your body are tense. Try to relax the muscles in those areas. How will I feel when I quit smoking? Day 1 to 3 weeks Within the first 24 hours, you may start to have some problems that come from quitting tobacco. These problems are very bad 2-3 days after you quit, but they do not often last for more than 2-3 weeks. You may get these symptoms:  Mood swings.  Feeling restless, nervous, angry, or annoyed.  Trouble concentrating.  Dizziness.  Strong desire for high-sugar foods and nicotine.  Weight gain.  Trouble pooping (constipation).  Feeling like you may vomit  (nausea).  Coughing or a sore throat.  Changes in how the medicines that you take for other issues work in your body.  Depression.  Trouble sleeping (insomnia). Week 3 and afterward After the first 2-3 weeks of quitting, you may start to notice more positive results, such as:  Better sense of smell and taste.  Less coughing and sore throat.  Slower heart rate.  Lower blood pressure.  Clearer skin.  Better breathing.  Fewer sick days. Quitting smoking can be hard. Do not give up if you fail the first time. Some people need to try a few times before they succeed. Do your best to stick to your quit plan, and talk with your doctor if you have any questions or concerns. Summary  Smoking tobacco is the leading cause of preventable death. Quitting smoking can be hard, but it is one of the best things that you can do for your health.  When you decide to quit smoking, make a plan to help you succeed.  Quit smoking right away, not slowly over a period of time.  When you start quitting, seek help from your doctor, family, or friends. This information is not intended to replace advice given to you by your health care provider. Make sure you discuss any questions you have with your health care provider. Document Revised: 05/12/2019 Document Reviewed: 11/05/2018 [employer] Patient Education  [employer].

## 2020-05-22 NOTE — Progress Notes (Signed)
Patient: Mary [name], Female    DOB: July 01, 1951, 69 y.o.   MRN: [id number] Delsa Grana, PA-C Visit Date: 05/23/2020  Today's Provider: Delsa Grana, PA-C   Chief Complaint  Patient presents with  . Annual Exam   Subjective:   Annual physical exam:  Mary [name] is a 69 y.o. female who presents today for complete physical exam:  Exercise/Activity:  Walking a lot, active Diet/nutrition:   Low salt, cooks at home, balanced diet Sleep:  Sleeps good  PAD - reviewed recent follow up with Vascular - no sx progression, repeated ABI was good and INR was therapeutic  Recent OV for HTN and HLD reviewed  Pt compliant with statin and norvasc and lipids and BP have been well controlled and at goal  USPSTF grade A and B recommendations - reviewed and addressed today  Depression:  Phq 9 completed today by patient, was reviewed by me with patient in the room PHQ score is neg, pt feels good PHQ 2/9 Scores 05/23/2020 02/20/2020 02/05/2020 06/23/2019  PHQ - 2 Score 0 0 0 0  PHQ- 9 Score - - 0 0   Depression screen [facility] 2/9 05/23/2020 02/20/2020 02/05/2020 06/23/2019 03/29/2019  Decreased Interest 0 0 0 0 0  Down, Depressed, Hopeless 0 0 0 0 0  PHQ - 2 Score 0 0 0 0 0  Altered sleeping - - 0 0 0  Tired, decreased energy - - 0 0 0  Change in appetite - - 0 0 0  Feeling bad or failure about yourself  - - 0 0 0  Trouble concentrating - - 0 0 0  Moving slowly or fidgety/restless - - 0 0 0  Suicidal thoughts - - 0 0 0  PHQ-9 Score - - 0 0 0  Difficult doing work/chores - - Not difficult at all Not difficult at all Not difficult at all  Some recent data might be hidden    Alcohol screening:   Office Visit from 05/23/2020 in [facility]  AUDIT-C Score 0      Immunizations and Health Maintenance: Health Maintenance  Topic Date Due  . PNA vac Low Risk Adult (1 of 2 - PCV13) 06/22/2020 (Originally 04/07/2018)  . INFLUENZA VACCINE  11/28/2020 (Originally 03/31/2020)  . DEXA  SCAN  02/04/2021 (Originally 04/07/2018)  . MAMMOGRAM  06/25/2020  . COLONOSCOPY  07/06/2020  . TETANUS/TDAP  09/28/2027  . COVID-19 Vaccine  Completed  . Hepatitis C Screening  Completed     Hep C Screening: 03/29/19  STD testing and prevention (HIV/chl/gon/syphilis):  see above, no additional testing desired by pt today  Intimate partner violence:  Safe at home  Sexual History/Pain during Intercourse: Married, sexually active no pain or problem  Menstrual History/LMP/Abnormal Bleeding:    None, denies  No LMP recorded. Patient is postmenopausal.  Incontinence Symptoms:  denies  Breast cancer: ordered Last Mammogram: 06/26/19 see HM list above BRCA gene screening: none known  Cervical cancer screening: 03/14/18 not due yet no family hx of cancers - breast, ovarian, uterine, colon:     Osteoporosis: dexa ordered with last MWV, pt has not done - she is going to schedule dexa when she does her mammogram next month Discussion on osteoporosis per age, including high calcium and vitamin D supplementation, weight bearing exercises Pt is on Vit D   Skin cancer:  Hx of skin CA -  NO Discussed atypical lesions   Colorectal cancer:   Colonoscopy is due - a 2  year follow up - due November 2021  Discussed concerning signs and sx of CRC, pt denies melena hematochezia   Lung cancer:  Pt not interested Low Dose CT Chest recommended if Age 71-80 years, 30 pack-year currently smoking OR have quit w/in 15years. Patient does qualify.    Social History   Tobacco Use  . Smoking status: Current Every Day Smoker    Packs/day: 0.50    Years: 40.00    Pack years: 20.00    Types: Cigarettes  . Smokeless tobacco: Never Used  Vaping Use  . Vaping Use: Never used  Substance Use Topics  . Alcohol use: No  . Drug use: No       [facility] Visit from 05/23/2020 in [facility]  AUDIT-C Score 0      Family History  Problem Relation Age of Onset  . Arthritis Mother   .  Diabetes Mother   . Stroke Mother   . Hypertension Mother   . Dementia Mother   . Cancer Father   . Arthritis Sister   . Seizures Sister   . Diabetes Sister   . Hypertension Sister   . COPD Sister   . Cancer Brother   . Stroke Maternal Grandmother   . Diabetes Paternal Grandmother   . Cancer Sister   . Diabetes Sister   . Arthritis Sister   . Diabetes Brother   . Stroke Brother   . Healthy Brother   . Diabetes Brother   . Stroke Brother   . Cancer Brother   . Heart disease Neg Hx   . Breast cancer Neg Hx      Blood pressure/Hypertension: BP Readings from Last 3 Encounters:  05/23/20 122/74  04/15/20 (!) 144/78  02/20/20 132/72    Weight/Obesity: Wt Readings from Last 3 Encounters:  05/23/20 126 lb 14.4 oz (57.6 kg)  04/15/20 127 lb (57.6 kg)  02/20/20 133 lb 1.6 oz (60.4 kg)   BMI Readings from Last 3 Encounters:  05/23/20 20.48 kg/m  04/15/20 20.50 kg/m  02/20/20 21.48 kg/m     Lipids:  Lab Results  Component Value Date   CHOL 130 02/05/2020   CHOL 141 03/29/2019   CHOL 124 02/22/2018   Lab Results  Component Value Date   HDL 43 (L) 02/05/2020   HDL 38 (L) 03/29/2019   HDL 41 (L) 02/22/2018   Lab Results  Component Value Date   LDLCALC 67 02/05/2020   LDLCALC 70 03/29/2019   LDLCALC 66 02/22/2018   Lab Results  Component Value Date   TRIG 112 02/05/2020   TRIG 248 (H) 03/29/2019   TRIG 89 02/22/2018   Lab Results  Component Value Date   CHOLHDL 3.0 02/05/2020   CHOLHDL 3.7 03/29/2019   CHOLHDL 3.0 02/22/2018   No results found for: LDLDIRECT Based on the results of lipid panel his/her cardiovascular risk factor ( using [employer] )  in the next 10 years is: The 10-year ASCVD risk score Mikey Bussing DC Brooke Bonito., et al., 2013) is: 12.4%   Values used to calculate the score:     Age: 25 years     Sex: Female     Is Non-Hispanic African American: Yes     Diabetic: No     Tobacco smoker: Yes     Systolic Blood Pressure: 177 mmHg     Is BP  treated: Yes     HDL Cholesterol: 43 mg/dL     Total Cholesterol: 130 mg/dL Glucose:  Glucose  Date Value Ref Range Status  01/21/2012 117 (H) 65 - 99 mg/dL Final   Glucose, Bld  Date Value Ref Range Status  02/05/2020 80 65 - 139 mg/dL Final    Comment:    .        Non-fasting reference interval .   03/29/2019 83 65 - 99 mg/dL Final    Comment:    .            Fasting reference interval .   02/22/2018 73 65 - 139 mg/dL Final    Comment:    .        Non-fasting reference interval .    Hypertension: BP Readings from Last 3 Encounters:  05/23/20 122/74  04/15/20 (!) 144/78  02/20/20 132/72   Obesity: Wt Readings from Last 3 Encounters:  05/23/20 126 lb 14.4 oz (57.6 kg)  04/15/20 127 lb (57.6 kg)  02/20/20 133 lb 1.6 oz (60.4 kg)   BMI Readings from Last 3 Encounters:  05/23/20 20.48 kg/m  04/15/20 20.50 kg/m  02/20/20 21.48 kg/m      Advanced Care Planning:  A voluntary discussion about advance care planning including the explanation and discussion   Social History      She        Social History   Socioeconomic History  . Marital status: Married    Spouse name: Joien  . Number of children: 0  . Years of education: Not on file  . Highest education level: 12th grade  Occupational History  . Occupation: Disabled  Tobacco Use  . Smoking status: Current Every Day Smoker    Packs/day: 0.50    Years: 40.00    Pack years: 20.00    Types: Cigarettes  . Smokeless tobacco: Never Used  Vaping Use  . Vaping Use: Never used  Substance and Sexual Activity  . Alcohol use: No  . Drug use: No  . Sexual activity: Yes  Other Topics Concern  . Not on file  Social History Narrative  . Not on file   Social Determinants of Health   Financial Resource Strain: Low Risk   . Difficulty of Paying Living Expenses: Not hard at all  Food Insecurity: No Food Insecurity  . Worried About Charity fundraiser in the Last Year: Never true  . Ran Out of Food in the  Last Year: Never true  Transportation Needs: No Transportation Needs  . Lack of Transportation (Medical): No  . Lack of Transportation (Non-Medical): No  Physical Activity: Sufficiently Active  . Days of Exercise per Week: 7 days  . Minutes of Exercise per Session: 40 min  Stress: No Stress Concern Present  . Feeling of Stress : Not at all  Social Connections: Moderately Integrated  . Frequency of Communication with Friends and Family: More than three times a week  . Frequency of Social Gatherings with Friends and Family: Once a week  . Attends Religious Services: 1 to 4 times per year  . Active Member of Clubs or Organizations: No  . Attends Archivist Meetings: Never  . Marital Status: Married    Family History        Family History  Problem Relation Age of Onset  . Arthritis Mother   . Diabetes Mother   . Stroke Mother   . Hypertension Mother   . Dementia Mother   . Cancer Father   . Arthritis Sister   . Seizures Sister   . Diabetes Sister   . Hypertension  Sister   . COPD Sister   . Cancer Brother   . Stroke Maternal Grandmother   . Diabetes Paternal Grandmother   . Cancer Sister   . Diabetes Sister   . Arthritis Sister   . Diabetes Brother   . Stroke Brother   . Healthy Brother   . Diabetes Brother   . Stroke Brother   . Cancer Brother   . Heart disease Neg Hx   . Breast cancer Neg Hx     Patient Active Problem List   Diagnosis Date Noted  . Anticoagulated on Coumadin 02/05/2020  . History of gout 06/23/2019  . Coronary artery disease 02/16/2019  . Benign neoplasm of cecum   . Benign neoplasm of transverse colon   . Polyp of sigmoid colon   . External hemorrhoids   . Hip arthritis 10/27/2017  . Breast lump on left side at 8 o'clock position 07/08/2017  . Breast lump on right side at 7 o'clock position 11/14/2015  . Encounter for monitoring Coumadin therapy 02/27/2015  . PAD (peripheral artery disease) ([place])   . Hypertension   .  Hyperlipidemia   . Tobacco abuse   . Hyperuricemia     Past Surgical History:  Procedure Laterality Date  . ANGIOPLASTY  July 2011   PAD- popliteal artery  . COLONOSCOPY WITH PROPOFOL N/A 01/13/2018   Procedure: COLONOSCOPY WITH PROPOFOL;  Surgeon: Lin Landsman, MD;  Location[facility] END[facility]COPY;  Service: Gastroenterology;  Laterality: N/A;  . COLONOSCOPY WITH PROPOFOL N/A 01/14/2018   Procedure: COLONOSCOPY WITH PROPOFOL;  Surgeon: Virgel Manifold, MD;  Location: [facility] ENDOSCOPY;  Service: Endoscopy;  Laterality: N/A;  . COLONOSCOPY WITH PROPOFOL N/A 07/06/2018   Procedure: COLONOSCOPY WITH PROPOFOL;  Surgeon: Virgel Manifold, MD;  Location: [facility] ENDOSCOPY;  Service: Endoscopy;  Laterality: N/A;  . CORONARY ANGIOPLASTY    . JOINT REPLACEMENT    . Leg Bypass Surgery Right 08/2010  . OVARIAN CYST SURGERY  1980s  . POPLITEAL ARTERY STENT Right 05/2010  . POPLITEAL ARTERY STENT Right 07/2010  . TOTAL HIP ARTHROPLASTY Left 10/27/2017   Procedure: TOTAL HIP ARTHROPLASTY;  Surgeon: Earnestine Leys, MD;  Location: [facility] ORS;  Service: Orthopedics;  Laterality: Left;     Current Outpatient Medications:  .  acetaminophen (TYLENOL) 500 MG tablet, Take 500 mg by mouth 2 (two) times daily as needed for moderate pain or headache., Disp: , Rfl:  .  amLODipine (NORVASC) 5 MG tablet, Take 1 tablet (5 mg total) by mouth daily. (Patient taking differently: Take 5 mg by mouth in the morning. ), Disp: 90 tablet, Rfl: 3 .  atorvastatin (LIPITOR) 10 MG tablet, Take 1 tablet (10 mg total) by mouth daily. (Patient taking differently: Take 10 mg by mouth in the morning. ), Disp: 90 tablet, Rfl: 3 .  cholecalciferol (VITAMIN D3) 25 MCG (1000 UT) tablet, Take 1,000 Units by mouth every other day., Disp: , Rfl:  .  potassium chloride (KLOR-CON 10) 10 MEQ tablet, Take 1 tablet (10 mEq total) by mouth 2 (two) times daily., Disp: 8 tablet, Rfl: 0 .  vitamin E 400 UNIT capsule, Take 180 Units by mouth daily. ,  Disp: , Rfl:  .  warfarin (COUMADIN) 5 MG tablet, Take as directed (Patient taking differently: Take 1 tab alternating daily with 1/2 tab), Disp: 90 tablet, Rfl: 0 .  Blood Pressure Monitoring (BLOOD PRESSURE KIT) DEVI, 1 each by Does not apply route 2 (two) times daily. Fluctuating blood pressures, DX I10 (Patient not  taking: Reported on 05/23/2020), Disp: 1 Device, Rfl: 0  No Known Allergies  Patient Care Team: Delsa Grana, PA-C as PCP - General (Family Medicine) Yolonda Kida, MD as Consulting Physician (Cardiology) Dew, Erskine Squibb, MD as Referring Physician (Vascular Surgery) Verdia Kuba, [facility] (Pharmacist)  Review of Systems  10 Systems reviewed and are negative for acute change except as noted in the HPI.   I personally reviewed active problem list, medication list, allergies, family history, social history, health maintenance, notes from last encounter, lab results, imaging with the patient/caregiver today.        Objective:   Vitals:  Vitals:   05/23/20 1025  BP: 122/74  Pulse: 85  Resp: 16  Temp: 98.5 F (36.9 C)  TempSrc: Oral  SpO2: 98%  Weight: 126 lb 14.4 oz (57.6 kg)  Height: '5\' 6"'  (1.676 m)    Body mass index is 20.48 kg/m.  Physical Exam Vitals and nursing note reviewed.  Constitutional:      General: She is not in acute distress.    Appearance: Normal appearance. She is well-developed and normal weight. She is not ill-appearing, toxic-appearing or diaphoretic.     Interventions: Face mask in place.  HENT:     Head: Normocephalic and atraumatic.     Right Ear: External ear normal.     Left Ear: External ear normal.     Nose: Nose normal.     Mouth/Throat:     Mouth: Mucous membranes are moist.  Eyes:     General: Lids are normal. No scleral icterus.       Right eye: No discharge.        Left eye: No discharge.     Conjunctiva/sclera: Conjunctivae normal.  Neck:     Thyroid: No thyroid mass, thyromegaly or thyroid tenderness.     Trachea:  Phonation normal. No tracheal deviation.  Cardiovascular:     Rate and Rhythm: Normal rate and regular rhythm.     Pulses: Normal pulses.          Radial pulses are 2+ on the right side and 2+ on the left side.       Posterior tibial pulses are 2+ on the right side and 2+ on the left side.     Heart sounds: Normal heart sounds. No murmur heard.  No friction rub. No gallop.   Pulmonary:     Effort: Pulmonary effort is normal. No respiratory distress.     Breath sounds: Normal breath sounds. No stridor. No wheezing, rhonchi or rales.  Chest:     Chest wall: No tenderness.     Comments: Declined breast exam Abdominal:     General: Bowel sounds are normal. There is no distension.     Palpations: Abdomen is soft.  Genitourinary:    Comments: Declined GU exam Musculoskeletal:     Cervical back: Normal range of motion. No rigidity.     Right lower leg: No edema.     Left lower leg: No edema.  Lymphadenopathy:     Cervical: No cervical adenopathy.  Skin:    General: Skin is warm and dry.     Coloration: Skin is not jaundiced or pale.     Findings: No rash.  Neurological:     Mental Status: She is alert.     Motor: No abnormal muscle tone.     Gait: Gait normal.  Psychiatric:        Mood and Affect: Mood normal.  Speech: Speech normal.        Behavior: Behavior normal.       Fall Risk: Fall Risk  05/23/2020 02/20/2020 02/05/2020 06/23/2019 03/29/2019  Falls in the past year? 0 0 0 0 0  Number falls in past yr: 0 0 0 0 0  Injury with Fall? 0 0 0 0 0  Risk for fall due to : - No Fall Risks - - -  Follow up Falls evaluation completed Falls prevention discussed - Falls evaluation completed -    Functional Status Survey: Is the patient deaf or have difficulty hearing?: No Does the patient have difficulty seeing, even when wearing glasses/contacts?: No Does the patient have difficulty concentrating, remembering, or making decisions?: No Does the patient have difficulty  walking or climbing stairs?: No Does the patient have difficulty dressing or bathing?: No Does the patient have difficulty doing errands alone such as visiting a doctor's office or shopping?: No   Assessment & Plan:    CPE completed today  . USPSTF grade A and B recommendations reviewed with patient; age-appropriate recommendations, preventive care, screening tests, etc discussed and encouraged; healthy living encouraged; see AVS for patient education given to patient  . Discussed importance of 150 minutes of physical activity weekly, AHA exercise recommendations given to pt in AVS/handout  . Discussed importance of healthy diet:  eating lean meats and proteins, avoiding trans fats and saturated fats, avoid simple sugars and excessive carbs in diet, eat 6 servings of fruit/vegetables daily and drink plenty of water and avoid sweet beverages.    . Recommended pt to do annual eye exam and routine dental exams/cleanings  . Depression, alcohol, fall screening completed as documented above and per flowsheets  . Reviewed Health Maintenance: Health Maintenance  Topic Date Due  . PNA vac Low Risk Adult (1 of 2 - PCV13) 06/22/2020 (Originally 04/07/2018)  . INFLUENZA VACCINE  11/28/2020 (Originally 03/31/2020)  . DEXA SCAN  02/04/2021 (Originally 04/07/2018)  . MAMMOGRAM  06/25/2020  . COLONOSCOPY  07/06/2020  . TETANUS/TDAP  09/28/2027  . COVID-19 Vaccine  Completed  . Hepatitis C Screening  Completed    . Immunizations: Immunization History  Administered Date(s) Administered  . Moderna SARS-COVID-2 Vaccination 10/28/2019, 11/25/2019  . Tdap 09/27/2017      ICD-10-CM   1. Annual physical exam  Z00.00 MM 3D SCREEN BREAST BILATERAL  2. Anticoagulated on Coumadin  Z79.01    per vascular, no bleeding signs or sx, no concerns, offered to do the [facility] monitoring here back in PCP office  3. PAD (peripheral artery disease) (HCC)  I73.9    stable, managed by vascular, pt compliant with meds  4.  Essential hypertension  I10    stable, well controlled with diet/lifestyle  5. Mixed hyperlipidemia  E78.2    has been well controlled, good med compliance, last labs 3 months ago reviewed, f/up in 6 mo  6. Tobacco abuse  Z72.0    shes cut back but not able to quit - discussed and offered resources/meds etc if and when she is ready to try and quit  7. Encounter for screening mammogram for malignant neoplasm of breast  Z12.31 MM 3D SCREEN BREAST BILATERAL       Delsa Grana, PA-C 05/23/20 10:40 AM  [facility]

## 2020-05-23 ENCOUNTER — Ambulatory Visit (INDEPENDENT_AMBULATORY_CARE_PROVIDER_SITE_OTHER): Payer: Medicare HMO | Admitting: Family Medicine

## 2020-05-23 ENCOUNTER — Encounter: Payer: Self-pay | Admitting: Family Medicine

## 2020-05-23 ENCOUNTER — Other Ambulatory Visit: Payer: Self-pay

## 2020-05-23 VITALS — BP 122/74 | HR 85 | Temp 98.5°F | Resp 16 | Ht 66.0 in | Wt 126.9 lb

## 2020-05-23 DIAGNOSIS — Z1231 Encounter for screening mammogram for malignant neoplasm of breast: Secondary | ICD-10-CM

## 2020-05-23 DIAGNOSIS — E782 Mixed hyperlipidemia: Secondary | ICD-10-CM

## 2020-05-23 DIAGNOSIS — Z720 Tobacco use: Secondary | ICD-10-CM | POA: Diagnosis not present

## 2020-05-23 DIAGNOSIS — Z7901 Long term (current) use of anticoagulants: Secondary | ICD-10-CM

## 2020-05-23 DIAGNOSIS — I10 Essential (primary) hypertension: Secondary | ICD-10-CM

## 2020-05-23 DIAGNOSIS — I739 Peripheral vascular disease, unspecified: Secondary | ICD-10-CM

## 2020-05-23 DIAGNOSIS — Z0000 Encounter for general adult medical examination without abnormal findings: Secondary | ICD-10-CM

## 2020-05-23 NOTE — Patient Instructions (Addendum)
Health Maintenance  Topic Date Due  . Pneumonia vaccines (1 of 2 - PCV13) 06/22/2020*  . Flu Shot  11/28/2020*  . DEXA scan (bone density measurement)  02/04/2021*  . Mammogram  06/25/2020  . Colon Cancer Screening  07/06/2020  . Tetanus Vaccine  09/28/2027  . COVID-19 Vaccine  Completed  .  Hepatitis C: One time screening is recommended by Center for Disease Control  (CDC) for  adults born from [birth year] through 1963.   Completed  *Topic was postponed. The date shown is not the original due date.    [facility] [place],  [place]  [address] Get Driving Directions Main: [phone]      Preventive Care 67 Years and Older, Female Preventive care refers to lifestyle choices and visits with your health care provider that can promote health and wellness. This includes:  A yearly physical exam. This is also called an annual well check.  Regular dental and eye exams.  Immunizations.  Screening for certain conditions.  Healthy lifestyle choices, such as diet and exercise. What can I expect for my preventive care visit? Physical exam Your health care provider will check:  Height and weight. These may be used to calculate body mass index (BMI), which is a measurement that tells if you are at a healthy weight.  Heart rate and blood pressure.  Your skin for abnormal spots. Counseling Your health care provider may ask you questions about:  Alcohol, tobacco, and drug use.  Emotional well-being.  Home and relationship well-being.  Sexual activity.  Eating habits.  History of falls.  Memory and ability to understand (cognition).  Work and work Statistician.  Pregnancy and menstrual history. What immunizations do I need?  Influenza (flu) vaccine  This is recommended every year. Tetanus, diphtheria, and pertussis (Tdap) vaccine  You may need a Td booster every 10 years. Varicella (chickenpox) vaccine  You may need  this vaccine if you have not already been vaccinated. Zoster (shingles) vaccine  You may need this after age 39. Pneumococcal conjugate (PCV13) vaccine  One dose is recommended after age 67. Pneumococcal polysaccharide (PPSV23) vaccine  One dose is recommended after age 67. Measles, mumps, and rubella (MMR) vaccine  You may need at least one dose of MMR if you were born in 1957 or later. You may also need a second dose. Meningococcal conjugate (MenACWY) vaccine  You may need this if you have certain conditions. Hepatitis A vaccine  You may need this if you have certain conditions or if you travel or work in places where you may be exposed to hepatitis A. Hepatitis B vaccine  You may need this if you have certain conditions or if you travel or work in places where you may be exposed to hepatitis B. Haemophilus influenzae type b (Hib) vaccine  You may need this if you have certain conditions. You may receive vaccines as individual doses or as more than one vaccine together in one shot (combination vaccines). Talk with your health care provider about the risks and benefits of combination vaccines. What tests do I need? Blood tests  Lipid and cholesterol levels. These may be checked every 5 years, or more frequently depending on your overall health.  Hepatitis C test.  Hepatitis B test. Screening  Lung cancer screening. You may have this screening every year starting at age 67 if you have a 30-pack-year history of smoking and currently smoke or have quit within the past 15 years.  Colorectal cancer screening. All adults should have this screening starting at age 67 and continuing until age 98. Your health care provider may recommend screening at age 67 if you are at increased risk. You will have tests every 1-10 years, depending on your results and the type of screening test.  Diabetes screening. This is done by checking your blood sugar (glucose) after you have not eaten for a  while (fasting). You may have this done every 1-3 years.  Mammogram. This may be done every 1-2 years. Talk with your health care provider about how often you should have regular mammograms.  BRCA-related cancer screening. This may be done if you have a family history of breast, ovarian, tubal, or peritoneal cancers. Other tests  Sexually transmitted disease (STD) testing.  Bone density scan. This is done to screen for osteoporosis. You may have this done starting at age 2. Follow these instructions at home: Eating and drinking  Eat a diet that includes fresh fruits and vegetables, whole grains, lean protein, and low-fat dairy products. Limit your intake of foods with high amounts of sugar, saturated fats, and salt.  Take vitamin and mineral supplements as recommended by your health care provider.  Do not drink alcohol if your health care provider tells you not to drink.  If you drink alcohol: ? Limit how much you have to 0-1 drink a day. ? Be aware of how much alcohol is in your drink. In the [place] one drink equals one 12 oz bottle of beer (355 mL), one 5 oz glass of wine (148 mL), or one 1 oz glass of hard liquor (44 mL). Lifestyle  Take daily care of your teeth and gums.  Stay active. Exercise for at least 30 minutes on 5 or more days each week.  Do not use any products that contain nicotine or tobacco, such as cigarettes, e-cigarettes, and chewing tobacco. If you need help quitting, ask your health care provider.  If you are sexually active, practice safe sex. Use a condom or other form of protection in order to prevent STIs (sexually transmitted infections).  Talk with your health care provider about taking a low-dose aspirin or statin. What's next?  Go to your health care provider once a year for a well check visit.  Ask your health care provider how often you should have your eyes and teeth checked.  Stay up to date on all vaccines. This information is not intended to  replace advice given to you by your health care provider. Make sure you discuss any questions you have with your health care provider. Document Revised: 08/11/2018 Document Reviewed: 08/11/2018 Elsevier Patient Education  2020 [place].   Preventing Osteoporosis, Adult Osteoporosis is a condition that causes the bones to lose density. This means that the bones become thinner, and the normal spaces in bone tissue become larger. Low bone density can make the bones weak and cause them to break more easily. Osteoporosis cannot always be prevented, but you can take steps to lower your risk of developing this condition. How can this condition affect me? If you develop osteoporosis, you will be more likely to break bones in your wrist, spine, or hip. Even a minor accident or injury can be enough to break weak bones. The bones will also be slower to heal. Osteoporosis can cause other problems as well, such as a stooped posture or trouble with movement. Osteoporosis can occur with aging. As you get older, you may lose bone tissue more quickly, or  it may be replaced more slowly. Osteoporosis is more likely to develop if you have poor nutrition or do not get enough calcium or vitamin D. Other lifestyle factors can also play a role. By eating a well-balanced diet and making lifestyle changes, you can help keep your bones strong and healthy, lowering your chances of developing osteoporosis. What can increase my risk? The following factors may make you more likely to develop osteoporosis:  Having a family history of the condition.  Having poor nutrition or not getting enough calcium or vitamin D.  Using certain medicines, such as steroid medicines or antiseizure medicines.  Being any of the following: ? 37 years of age or older. ? Female. ? A woman who has gone through menopause (is postmenopausal). ? White (Caucasian) or of Asian descent.  Smoking or having a history of smoking.  Not being  physically active (being sedentary).  Having a small body frame. What actions can I take to prevent this?  Get enough calcium   Make sure you get enough calcium every day. Calcium is the most important mineral for bone health. Most people can get enough calcium from their diet, but supplements may be recommended for people who are at risk for osteoporosis. Follow these guidelines: ? If you are age 63 or younger, aim to get 1,000 mg of calcium every day. ? If you are older than age 39, aim to get 1,200 mg of calcium every day.  Good sources of calcium include: ? Dairy products, such as low-fat or nonfat milk, cheese, and yogurt. ? Dark green leafy vegetables, such as bok choy and broccoli. ? Foods that have had calcium added to them (calcium-fortified foods), such as orange juice, cereal, bread, soy beverages, and tofu products. ? Nuts, such as almonds.  Check nutrition labels to see how much calcium is in a food or drink. Get enough vitamin D  Try to get enough vitamin D every day. Vitamin D is the most essential vitamin for bone health. It helps the body absorb calcium. Follow these guidelines for how much vitamin D to get from food: ? If you are age 72 or younger, aim to get at least 600 international units (IU) every day. Your health care provider may suggest more. ? If you are older than age 26, aim to get at least 800 international units every day. Your health care provider may suggest more.  Good sources of vitamin D in your diet include: ? Egg yolks. ? Oily fish, such as salmon, sardines, and tuna. ? Milk and cereal fortified with vitamin D.  Your body also makes vitamin D when you are out in the sun. Exposing the bare skin on your face, arms, legs, or back to the sun for no more than 30 minutes a day, 2 times a week is more than enough. Beyond that, make sure you use sunblock to protect your skin from sunburn, which increases your risk for skin cancer. Exercise  Stay active  and get exercise every day.  Ask your health care provider what types of exercise are best for you. Weight-bearing and strength-building activities are important for building and maintaining healthy bones. Some examples of these types of activities include: ? Walking and hiking. ? Jogging and running. ? Dancing. ? Gym exercises. ? Lifting weights. ? Tennis and racquetball. ? Climbing stairs. ? Aerobics. Make other lifestyle changes  Do not use any products that contain nicotine or tobacco, such as cigarettes, e-cigarettes, and chewing tobacco. If you need  help quitting, ask your health care provider.  Lose weight if you are overweight.  If you drink alcohol: ? Limit how much you use to:  0-1 drink a day for nonpregnant women.  0-2 drinks a day for men. ? Be aware of how much alcohol is in your drink. In the [place] one drink equals one 12 oz bottle of beer (355 mL), one 5 oz glass of wine (148 mL), or one 1 oz glass of hard liquor (44 mL). Where to find support If you need help making changes to prevent osteoporosis, talk with your health care provider. You can ask for a referral to a diet and nutrition specialist (dietitian) and a physical therapist. Where to find more information Learn more about osteoporosis from:  NIH Osteoporosis and Related [place]: www.bones.SouthExposed.es  [place] Office on [employer]: [place]s.tn  [place]: EquipmentWeekly.com.ee Summary  Osteoporosis is a condition that causes weak bones that are more likely to break.  Eat a healthy diet, making sure you get enough calcium and vitamin D, and stay active by getting regular exercise to help prevent osteoporosis.  Other ways to reduce your risk of osteoporosis include maintaining a healthy weight and avoiding alcohol and products that contain nicotine or tobacco. This information is not intended to replace advice given to you by your health care provider.  Make sure you discuss any questions you have with your health care provider. Document Revised: 03/17/2019 Document Reviewed: 03/17/2019 Elsevier Patient Education  [place].

## 2020-05-28 ENCOUNTER — Telehealth (INDEPENDENT_AMBULATORY_CARE_PROVIDER_SITE_OTHER): Payer: Self-pay

## 2020-05-28 NOTE — Telephone Encounter (Signed)
Patient left a voicemail requesting a refill for Warfarin to be sent into pharmacy. The patient last inr check was 11/22/19 and has not being check. The patient stated that [facility] would check her inr or she could come in our office to be check. I informed the patient that she would have to be check at the hospital lab for our office. The patient contacted [facility] and will be schedule an appointment for her inr check and there office will be refilling the Warfarin.

## 2020-05-29 ENCOUNTER — Other Ambulatory Visit (INDEPENDENT_AMBULATORY_CARE_PROVIDER_SITE_OTHER): Payer: Self-pay | Admitting: Nurse Practitioner

## 2020-06-10 DIAGNOSIS — L820 Inflamed seborrheic keratosis: Secondary | ICD-10-CM | POA: Diagnosis not present

## 2020-06-10 DIAGNOSIS — D485 Neoplasm of uncertain behavior of skin: Secondary | ICD-10-CM | POA: Diagnosis not present

## 2020-06-11 ENCOUNTER — Telehealth: Payer: Self-pay

## 2020-06-11 DIAGNOSIS — Z7901 Long term (current) use of anticoagulants: Secondary | ICD-10-CM

## 2020-06-11 NOTE — Telephone Encounter (Signed)
Copied from Mary [name] [phone]. Topic: General - Inquiry >> Jun 11, 2020 11:48 AM Scherrie Gerlach wrote: Reason for CRM: pt states she waas getting her INR checked at [facility].  But now they have changed and she must go to hospital to have it checked. They told the pt se could come to your office and have it checked. She wants to know if that is OK. Also she infrmed me that she only has enough warfarin to last until next Tues.

## 2020-06-11 NOTE — Telephone Encounter (Signed)
lvm for pt to call back and schedule an appt °

## 2020-06-12 ENCOUNTER — Other Ambulatory Visit: Payer: Self-pay

## 2020-06-12 NOTE — Telephone Encounter (Signed)
So just to clarify you are now taking over her care for her PT/INR present and future?

## 2020-06-12 NOTE — Telephone Encounter (Signed)
PT/INR goal 2-3  On warfarin 5 mg, take 1 tab alternatine 1/2 tab every other day Lab Results  Component Value Date   INR 2.0 (H) 11/22/2019   INR 1.7 (H) 11/15/2019   INR 1.4 (H) 11/06/2019   Last INR I can see was March Pt to run out of meds  PT/INR ordered - if pt able to get into clinic this week I can do quick visit with her, check the PT/INR and send in her refills before she is out.  We have called the pt but not spoken with her.  I am ordering labs. I will be in clinic the next 2 days - staff will need to room her with full VS and notify me of results so I can see and check pt.      ICD-10-CM   1. Anticoagulated on Coumadin  Z79.01 PT with INR/Fingerstick   Delsa Grana, PA-C

## 2020-06-12 NOTE — Telephone Encounter (Signed)
Pt notified, has appt Monday and has enough meds til then

## 2020-06-17 ENCOUNTER — Encounter: Payer: Self-pay | Admitting: Family Medicine

## 2020-06-17 ENCOUNTER — Ambulatory Visit (INDEPENDENT_AMBULATORY_CARE_PROVIDER_SITE_OTHER): Payer: Medicare HMO | Admitting: Family Medicine

## 2020-06-17 ENCOUNTER — Other Ambulatory Visit: Payer: Self-pay

## 2020-06-17 VITALS — BP 120/68 | HR 93 | Temp 98.3°F | Resp 16 | Ht 66.0 in | Wt 127.9 lb

## 2020-06-17 DIAGNOSIS — Z720 Tobacco use: Secondary | ICD-10-CM

## 2020-06-17 DIAGNOSIS — E782 Mixed hyperlipidemia: Secondary | ICD-10-CM | POA: Diagnosis not present

## 2020-06-17 DIAGNOSIS — Z7901 Long term (current) use of anticoagulants: Secondary | ICD-10-CM | POA: Diagnosis not present

## 2020-06-17 DIAGNOSIS — I739 Peripheral vascular disease, unspecified: Secondary | ICD-10-CM

## 2020-06-17 DIAGNOSIS — I2510 Atherosclerotic heart disease of native coronary artery without angina pectoris: Secondary | ICD-10-CM

## 2020-06-17 DIAGNOSIS — Z5181 Encounter for therapeutic drug level monitoring: Secondary | ICD-10-CM

## 2020-06-17 DIAGNOSIS — I10 Essential (primary) hypertension: Secondary | ICD-10-CM | POA: Diagnosis not present

## 2020-06-17 LAB — POCT INR: INR: 1.6 — AB (ref 2.0–3.0)

## 2020-06-17 MED ORDER — ATORVASTATIN CALCIUM 10 MG PO TABS: 10.0000 mg | ORAL_TABLET | Freq: Every day | ORAL | 3 refills | Status: DC

## 2020-06-17 MED ORDER — WARFARIN SODIUM 5 MG PO TABS: ORAL_TABLET | ORAL | 0 refills | Status: DC

## 2020-06-17 NOTE — Progress Notes (Signed)
Patient ID: Mary [name], female    DOB: 1950-11-23, 69 y.o.   MRN: [id number]  PCP: Delsa Grana, PA-C  Chief Complaint  Patient presents with  . Anticoagulation    Subjective:   Mary [name] is a 69 y.o. female, presents to clinic with CC of the following:  Here for PT/INR and coumadin monitoring Last that I can see done was March 2021 - managed by vascular and previously managed by PCP  Chart previously reviewed when pt was requesting refills last week PT/INR goal 2-3  On warfarin 5 mg, take 1 tab alternating 1/2 tab every other day Recent Labs       Lab Results  Component Value Date   INR 2.0 (H) 11/22/2019   INR 1.7 (H) 11/15/2019   INR 1.4 (H) 11/06/2019     Pt has not yet run out of meds, she reports taking as previously prescribed.  No bleeding sx or concerns No recent med changes or new meds or abx She will continue to see vascular twice a year for PAD monitoring    Patient Active Problem List   Diagnosis Date Noted  . Anticoagulated on Coumadin 02/05/2020  . History of gout 06/23/2019  . Coronary artery disease 02/16/2019  . Benign neoplasm of cecum   . Benign neoplasm of transverse colon   . Polyp of sigmoid colon   . External hemorrhoids   . Hip arthritis 10/27/2017  . Breast lump on left side at 8 o'clock position 07/08/2017  . Breast lump on right side at 7 o'clock position 11/14/2015  . Encounter for monitoring Coumadin therapy 02/27/2015  . PAD (peripheral artery disease) ([place])   . Hypertension   . Hyperlipidemia   . Tobacco abuse   . Hyperuricemia       Current Outpatient Medications:  .  acetaminophen (TYLENOL) 500 MG tablet, Take 500 mg by mouth 2 (two) times daily as needed for moderate pain or headache., Disp: , Rfl:  .  amLODipine (NORVASC) 5 MG tablet, Take 1 tablet (5 mg total) by mouth daily. (Patient taking differently: Take 5 mg by mouth in the morning. ), Disp: 90 tablet, Rfl: 3 .  atorvastatin (LIPITOR) 10 MG tablet,  Take 1 tablet (10 mg total) by mouth daily. (Patient taking differently: Take 10 mg by mouth in the morning. ), Disp: 90 tablet, Rfl: 3 .  cholecalciferol (VITAMIN D3) 25 MCG (1000 UT) tablet, Take 1,000 Units by mouth every other day., Disp: , Rfl:  .  potassium chloride (KLOR-CON 10) 10 MEQ tablet, Take 1 tablet (10 mEq total) by mouth 2 (two) times daily., Disp: 8 tablet, Rfl: 0 .  vitamin E 400 UNIT capsule, Take 180 Units by mouth daily. , Disp: , Rfl:  .  warfarin (COUMADIN) 5 MG tablet, Take 5 mg tablet Su, Tues, Wed, Th and Sa and take 1/2 tablet (2.5 mg) Mon, Fri, Disp: 30 tablet, Rfl: 0 .  Blood Pressure Monitoring (BLOOD PRESSURE KIT) DEVI, 1 each by Does not apply route 2 (two) times daily. Fluctuating blood pressures, DX I10 (Patient not taking: Reported on 05/23/2020), Disp: 1 Device, Rfl: 0   No Known Allergies   Social History   Tobacco Use  . Smoking status: Current Every Day Smoker    Packs/day: 0.50    Years: 40.00    Pack years: 20.00    Types: Cigarettes  . Smokeless tobacco: Never Used  Vaping Use  . Vaping Use: Never used  Substance Use  Topics  . Alcohol use: No  . Drug use: No      Chart Review Today: I personally reviewed active problem list, medication list, allergies, family history, social history, health maintenance, notes from last encounter, lab results, imaging with the patient/caregiver today.   Review of Systems 10 Systems reviewed and are negative for acute change except as noted in the HPI.     Objective:   Vitals:   06/17/20 1048  BP: 120/68  Pulse: 93  Resp: 16  Temp: 98.3 F (36.8 C)  TempSrc: Oral  SpO2: 98%  Weight: 127 lb 14.4 oz (58 kg)  Height: '5\' 6"'  (1.676 m)    Body mass index is 20.64 kg/m.  Physical Exam Vitals and nursing note reviewed.  Constitutional:      General: She is not in acute distress.    Appearance: Normal appearance. She is well-developed. She is not ill-appearing, toxic-appearing or diaphoretic.      Interventions: Face mask in place.     Comments: Pleasant, well-appearing thin female  HENT:     Head: Normocephalic and atraumatic.     Right Ear: External ear normal.     Left Ear: External ear normal.  Eyes:     General: Lids are normal. No scleral icterus.       Right eye: No discharge.        Left eye: No discharge.     Conjunctiva/sclera: Conjunctivae normal.  Neck:     Trachea: Phonation normal. No tracheal deviation.  Cardiovascular:     Rate and Rhythm: Normal rate and regular rhythm.     Pulses: Normal pulses.          Radial pulses are 2+ on the right side and 2+ on the left side.       Posterior tibial pulses are 2+ on the right side and 2+ on the left side.     Heart sounds: Normal heart sounds. No murmur heard.  No friction rub. No gallop.   Pulmonary:     Effort: Pulmonary effort is normal. No respiratory distress.     Breath sounds: Normal breath sounds. No stridor. No wheezing, rhonchi or rales.  Chest:     Chest wall: No tenderness.  Abdominal:     General: Bowel sounds are normal. There is no distension.     Palpations: Abdomen is soft.  Musculoskeletal:        General: No tenderness.     Right lower leg: No edema.     Left lower leg: No edema.  Skin:    General: Skin is warm and dry.     Coloration: Skin is not jaundiced or pale.     Findings: No bruising or rash.  Neurological:     Mental Status: She is alert.     Motor: No abnormal muscle tone.     Gait: Gait normal.  Psychiatric:        Mood and Affect: Mood normal.        Speech: Speech normal.        Behavior: Behavior normal.      Results for orders placed or performed in visit on 06/17/20  POCT INR  Result Value Ref Range   INR 1.6 (A) 2.0 - 3.0       Assessment & Plan:     ICD-10-CM   1. Anticoagulated on Coumadin  Z79.01 POCT INR    warfarin (COUMADIN) 5 MG tablet   no current bleeding concerns, has been subtherapeutic  several times, last INR was 2.0 in March - 7 months ago  2.  PAD (peripheral artery disease) ([facility])  I73.9 POCT INR    warfarin (COUMADIN) 5 MG tablet    atorvastatin (LIPITOR) 10 MG tablet   no change in sx, stable, chronic, per vascular specialists, last OV reviewed  3. Subtherapeutic anticoagulation  Z51.81 POCT INR   Z79.01 warfarin (COUMADIN) 5 MG tablet   dose increase by 15%, compared her 2 week total dosing, 15% increase achieved by changing dosing to 5 mg 5x a week and 2.5 mg 2x a week  4. Mixed hyperlipidemia  E78.2 atorvastatin (LIPITOR) 10 MG tablet   compliant with statin, routine f/up and lipids UTD, recently done and reviewed, continue statin and diet/lifestyle efforts  5. Primary hypertension  I10    stable, well controlled on norvasc, continue meds, diet/lifestyle efforts  6. Tobacco abuse  Z72.0    continued smoker, she has cut back but unable to quit completely  7. Coronary artery disease involving native coronary artery of native heart without angina pectoris  I25.10 atorvastatin (LIPITOR) 10 MG tablet   stable, no exertional sx, compliant with statin, anticoagulated     Coumadin dose increase, recheck next week.  INR goal 2-3    Delsa Grana, PA-C 06/17/20 12:14 PM

## 2020-06-28 ENCOUNTER — Ambulatory Visit (INDEPENDENT_AMBULATORY_CARE_PROVIDER_SITE_OTHER): Payer: Medicare HMO | Admitting: Family Medicine

## 2020-06-28 ENCOUNTER — Other Ambulatory Visit: Payer: Self-pay

## 2020-06-28 ENCOUNTER — Encounter: Payer: Self-pay | Admitting: Family Medicine

## 2020-06-28 VITALS — BP 124/72 | HR 90 | Temp 98.8°F | Resp 16 | Ht 66.0 in | Wt 120.3 lb

## 2020-06-28 DIAGNOSIS — I739 Peripheral vascular disease, unspecified: Secondary | ICD-10-CM

## 2020-06-28 DIAGNOSIS — Z7901 Long term (current) use of anticoagulants: Secondary | ICD-10-CM

## 2020-06-28 DIAGNOSIS — Z5181 Encounter for therapeutic drug level monitoring: Secondary | ICD-10-CM

## 2020-06-28 LAB — POCT INR: INR: 1.9 — AB (ref 2.0–3.0)

## 2020-06-28 MED ORDER — WARFARIN SODIUM 5 MG PO TABS: ORAL_TABLET | ORAL | 1 refills | Status: DC

## 2020-06-28 NOTE — Patient Instructions (Signed)
Vitamin K Foods and Warfarin Warfarin is a blood thinner (anticoagulant). Anticoagulant medicines help prevent the formation of blood clots. These medicines work by decreasing the activity of vitamin K, which promotes normal blood clotting. When you take warfarin, problems can occur from suddenly increasing or decreasing the amount of vitamin K that you eat from one day to the next. Problems may include:  Blood clots.  Bleeding. What general guidelines do I need to follow? To avoid problems when taking warfarin:  Eat a balanced diet that includes: ? Fresh fruits and vegetables. ? Whole grains. ? Low-fat dairy products. ? Lean proteins, such as fish, eggs, and lean cuts of meat.  Keep your intake of vitamin K consistent from day to day. To do this: ? Avoid eating large amounts of vitamin K one day and low amounts of vitamin K the next day. ? If you take a multivitamin that contains vitamin K, be sure to take it every day. ? Know which foods contain vitamin K. Use the lists below to understand serving sizes and the amount of vitamin K in one serving.  Avoid major changes in your diet. If you are going to change your diet, talk with your health care provider before making changes.  Work with a Financial planner (dietitian) to develop a meal plan that works best for you.  High vitamin K foods Foods that are high in vitamin K contain more than 100 mcg (micrograms) per serving. These include:  Broccoli (cooked) -  cup has 110 mcg.  Brussels sprouts (cooked) -  cup has 109 mcg.  Greens, beet (cooked) -  cup has 350 mcg.  Greens, collard (cooked) -  cup has 418 mcg.  Greens, turnip (cooked) -  cup has 265 mcg.  Green onions or scallions -  cup has 105 mcg.  Kale (fresh or frozen) -  cup has 531 mcg.  Parsley (raw) - 10 sprigs has 164 mcg.  Spinach (cooked) -  cup has 444 mcg.  Swiss chard (cooked) -  cup has 287 mcg. Moderate vitamin K foods Foods that have a  moderate amount of vitamin K contain 25-100 mcg per serving. These include:  Asparagus (cooked) - 5 spears have 38 mcg.  Black-eyed peas (dried) -  cup has 32 mcg.  Cabbage (cooked) -  cup has 37 mcg.  Kiwi fruit - 1 medium has 31 mcg.  Lettuce - 1 cup has 57-63 mcg.  Okra (frozen) -  cup has 44 mcg.  Prunes (dried) - 5 prunes have 25 mcg.  Watercress (raw) - 1 cup has 85 mcg. Low vitamin K foods Foods low in vitamin K contain less than 25 mcg per serving. These include:  Artichoke - 1 medium has 18 mcg.  Avocado - 1 oz. has 6 mcg.  Blueberries -  cup has 14 mcg.  Cabbage (raw) -  cup has 21 mcg.  Carrots (cooked) -  cup has 11 mcg.  Cauliflower (raw) -  cup has 11 mcg.  Cucumber with peel (raw) -  cup has 9 mcg.  Grapes -  cup has 12 mcg.  Mango - 1 medium has 9 mcg.  Nuts - 1 oz. has 15 mcg.  Pear - 1 medium has 8 mcg.  Peas (cooked) -  cup has 19 mcg.  Pickles - 1 spear has 14 mcg.  Pumpkin seeds - 1 oz. has 13 mcg.  Sauerkraut (canned) -  cup has 16 mcg.  Soybeans (cooked) -  cup has 16 mcg.  Tomato (raw) - 1 medium has 10 mcg.  Tomato sauce -  cup has 17 mcg. Vitamin K-free foods If a food contain less than 5 mcg per serving, it is considered to have no vitamin K. These foods include:  Bread and cereal products.  Cheese.  Eggs.  Fish and shellfish.  Meat and poultry.  Milk and dairy products.  Sunflower seeds. Actual amounts of vitamin K in foods may be different depending on processing. Talk with your dietitian about what foods you can eat and what foods you should avoid. This information is not intended to replace advice given to you by your health care provider. Make sure you discuss any questions you have with your health care provider. Document Revised: 07/30/2017 Document Reviewed: 11/20/2015 Elsevier Patient Education  2020 [employer].

## 2020-06-28 NOTE — Progress Notes (Signed)
Patient ID: Mary [name], female    DOB: 03/23/1951, 69 y.o.   MRN: [id number]  PCP: Delsa Grana, PA-C  Chief Complaint  Patient presents with  . Anticoagulation    follow up 5mg  for 5 days and 2.5 mg 2 days    Subjective:   Mary [name] is a 69 y.o. female, presents to clinic with CC of the following:  HPI  Here for repeat PT/INR   Dose increase last week from 5 mg and 2.5 mg every other day, to 5 mg 5/7 d a week and 2.5 mg 2/7d on M and F INR still not at goal Lab Results  Component Value Date   INR 1.9 (A) 06/28/2020   INR 1.6 (A) 06/17/2020   INR 2.0 (H) 11/22/2019  She reports she is compliant with meds No bleeding or SE   Patient Active Problem List   Diagnosis Date Noted  . Anticoagulated on Coumadin 02/05/2020  . History of gout 06/23/2019  . Coronary artery disease 02/16/2019  . Benign neoplasm of cecum   . Benign neoplasm of transverse colon   . Polyp of sigmoid colon   . External hemorrhoids   . Hip arthritis 10/27/2017  . Breast lump on left side at 8 o'clock position 07/08/2017  . Breast lump on right side at 7 o'clock position 11/14/2015  . Encounter for monitoring Coumadin therapy 02/27/2015  . PAD (peripheral artery disease) ([place])   . Hypertension   . Hyperlipidemia   . Tobacco abuse   . Hyperuricemia       Current Outpatient Medications:  .  acetaminophen (TYLENOL) 500 MG tablet, Take 500 mg by mouth 2 (two) times daily as needed for moderate pain or headache., Disp: , Rfl:  .  amLODipine (NORVASC) 5 MG tablet, Take 1 tablet (5 mg total) by mouth daily. (Patient taking differently: Take 5 mg by mouth in the morning. ), Disp: 90 tablet, Rfl: 3 .  atorvastatin (LIPITOR) 10 MG tablet, Take 1 tablet (10 mg total) by mouth at bedtime., Disp: 90 tablet, Rfl: 3 .  cholecalciferol (VITAMIN D3) 25 MCG (1000 UT) tablet, Take 1,000 Units by mouth every other day., Disp: , Rfl:  .  potassium chloride (KLOR-CON 10) 10 MEQ tablet, Take 1 tablet (10 mEq  total) by mouth 2 (two) times daily., Disp: 8 tablet, Rfl: 0 .  vitamin E 400 UNIT capsule, Take 180 Units by mouth daily. , Disp: , Rfl:  .  warfarin (COUMADIN) 5 MG tablet, Take 5 mg tablet Su, Tues, Wed, Th and Sa and take 1/2 tablet (2.5 mg) Mon, Fri, Disp: 30 tablet, Rfl: 0   No Known Allergies   Social History   Tobacco Use  . Smoking status: Current Every Day Smoker    Packs/day: 0.50    Years: 40.00    Pack years: 20.00    Types: Cigarettes  . Smokeless tobacco: Never Used  Vaping Use  . Vaping Use: Never used  Substance Use Topics  . Alcohol use: No  . Drug use: No      Chart Review Today: I personally reviewed active problem list, medication list, allergies, family history, social history, health maintenance, notes from last encounter, lab results, imaging with the patient/caregiver today.   Review of Systems 10 Systems reviewed and are negative for acute change except as noted in the HPI.     Objective:   Vitals:   06/28/20 1409  BP: 124/72  Pulse: 90  Resp: 16  Temp: 98.8 F (37.1 C)  TempSrc: Oral  SpO2: 96%  Weight: 120 lb 4.8 oz (54.6 kg)  Height: 5\' 6"  (1.676 m)    Body mass index is 19.42 kg/m.  Physical Exam Vitals and nursing note reviewed.  Constitutional:      General: She is not in acute distress.    Appearance: Normal appearance. She is not ill-appearing, toxic-appearing or diaphoretic.  HENT:     Head: Normocephalic and atraumatic.  Cardiovascular:     Rate and Rhythm: Normal rate.  Pulmonary:     Effort: Pulmonary effort is normal. No respiratory distress.  Skin:    General: Skin is warm and dry.     Coloration: Skin is not jaundiced or pale.  Neurological:     Mental Status: She is alert.  Psychiatric:        Mood and Affect: Mood normal.        Behavior: Behavior normal.      Results for orders placed or performed in visit on 06/28/20  POCT INR  Result Value Ref Range   INR 1.9 (A) 2.0 - 3.0       Assessment &  Plan:     ICD-10-CM   1. Subtherapeutic anticoagulation  Z51.81 POCT INR   Z79.01 warfarin (COUMADIN) 5 MG tablet   still subtherapeutic - keep dosing the same, reviewed diet, pt to work on changing foods to avoid high vit K and recheck in 1 month  2. Anticoagulated on Coumadin  Z79.01 POCT INR    warfarin (COUMADIN) 5 MG tablet   we discussed other drug options to avoid the PT/INR monitoring - drug information given, she has adequate renal function, do not see other contraindications  3. PAD (peripheral artery disease) (HCC)  I73.9 POCT INR    warfarin (COUMADIN) 5 MG tablet   no change in sx, stable, chronic, per vascular specialists, last OV reviewed  4. Anticoagulated on Coumadin  Z79.01 POCT INR    warfarin (COUMADIN) 5 MG tablet   no current bleeding concerns, has been subtherapeutic several times, last INR was 2.0 in March - 7 months ago        Delsa Grana, Hershal Coria 06/28/20 2:23 PM

## 2020-07-03 ENCOUNTER — Telehealth: Payer: Self-pay | Admitting: *Deleted

## 2020-07-03 NOTE — Chronic Care Management (AMB) (Signed)
  Care Management   Note  07/03/2020 Name: Mary [name] MRN: [id number] DOB: 09-15-1950  Mary [name] is a 69 y.o. year old female who is a primary care patient of Laurell Roof and is actively engaged with the care management team. I reached out to [name] by phone today to assist with canceling  a follow up visit with the Pharmacist.  Follow up plan: Unsuccessful telephone outreach attempt made. A HIPAA compliant phone message was left for the patient providing contact information and requesting a return call.  If patient returns call to provider office, please advise to call [place] at [phone].  [place] [facility]

## 2020-07-04 ENCOUNTER — Telehealth: Payer: Self-pay

## 2020-07-04 NOTE — Telephone Encounter (Signed)
Thank you, please than [place] for calling us back with that info We can consult a pharmacist to help with getting meds more affordably - she does not have to change meds if we cannot get an affordable substitute.  But if she wants - helen please put in [facility] pharmacy consult for med assistance

## 2020-07-04 NOTE — Telephone Encounter (Signed)
Please advise 

## 2020-07-04 NOTE — Telephone Encounter (Signed)
Copied from [place] [id number]. Topic: General - Other >> Jul 03, 2020  4:15 PM Rainey [place] A wrote: Patient called to inform PCP that she contact insurance and if she gets the following prescriptions: Eloquis 5mg  it will be $47/mo Xarelto 2.5 mg $47/mo Pradaxt75mg  $100/mo Patient wanted to make pcp aware that she cannot afford any of these medication monthly. Please advise

## 2020-07-29 ENCOUNTER — Ambulatory Visit (INDEPENDENT_AMBULATORY_CARE_PROVIDER_SITE_OTHER): Payer: Medicare HMO | Admitting: Family Medicine

## 2020-07-29 ENCOUNTER — Encounter: Payer: Self-pay | Admitting: Family Medicine

## 2020-07-29 ENCOUNTER — Other Ambulatory Visit: Payer: Self-pay

## 2020-07-29 VITALS — BP 120/76 | HR 96 | Temp 98.3°F | Resp 16 | Ht 66.0 in | Wt 128.8 lb

## 2020-07-29 DIAGNOSIS — I10 Essential (primary) hypertension: Secondary | ICD-10-CM | POA: Diagnosis not present

## 2020-07-29 DIAGNOSIS — Z5181 Encounter for therapeutic drug level monitoring: Secondary | ICD-10-CM

## 2020-07-29 DIAGNOSIS — E790 Hyperuricemia without signs of inflammatory arthritis and tophaceous disease: Secondary | ICD-10-CM | POA: Diagnosis not present

## 2020-07-29 DIAGNOSIS — Z8739 Personal history of other diseases of the musculoskeletal system and connective tissue: Secondary | ICD-10-CM | POA: Diagnosis not present

## 2020-07-29 DIAGNOSIS — I739 Peripheral vascular disease, unspecified: Secondary | ICD-10-CM | POA: Diagnosis not present

## 2020-07-29 DIAGNOSIS — E782 Mixed hyperlipidemia: Secondary | ICD-10-CM

## 2020-07-29 DIAGNOSIS — Z7901 Long term (current) use of anticoagulants: Secondary | ICD-10-CM | POA: Diagnosis not present

## 2020-07-29 LAB — COMPLETE METABOLIC PANEL WITH GFR
AG Ratio: 1.4 (calc) (ref 1.0–2.5)
ALT: 10 U/L (ref 6–29)
AST: 17 U/L (ref 10–35)
Albumin: 4.3 g/dL (ref 3.6–5.1)
Alkaline phosphatase (APISO): 87 U/L (ref 37–153)
BUN: 7 mg/dL (ref 7–25)
CO2: 32 mmol/L (ref 20–32)
Calcium: 9.7 mg/dL (ref 8.6–10.4)
Chloride: 103 mmol/L (ref 98–110)
Creat: 0.86 mg/dL (ref 0.50–0.99)
GFR, Est African American: 81 mL/min/{1.73_m2} (ref >=60)
GFR, Est Non African American: 70 mL/min/{1.73_m2} (ref >=60)
Globulin: 3 g/dL (calc) (ref 1.9–3.7)
Glucose, Bld: 54 mg/dL — ABNORMAL LOW (ref 65–99)
Potassium: 3.6 mmol/L (ref 3.5–5.3)
Sodium: 141 mmol/L (ref 135–146)
Total Bilirubin: 0.3 mg/dL (ref 0.2–1.2)
Total Protein: 7.3 g/dL (ref 6.1–8.1)

## 2020-07-29 LAB — CBC WITH DIFFERENTIAL/PLATELET
Absolute Monocytes: 682 cells/uL (ref 200–950)
Basophils Absolute: 31 cells/uL (ref 0–200)
Basophils Relative: 0.5 %
Eosinophils Absolute: 118 cells/uL (ref 15–500)
Eosinophils Relative: 1.9 %
HCT: 40.4 % (ref 35.0–45.0)
Hemoglobin: 14 g/dL (ref 11.7–15.5)
Lymphs Abs: 3602 cells/uL (ref 850–3900)
MCH: 30.2 pg (ref 27.0–33.0)
MCHC: 34.7 g/dL (ref 32.0–36.0)
MCV: 87.1 fL (ref 80.0–100.0)
MPV: 9.1 fL (ref 7.5–12.5)
Monocytes Relative: 11 %
Neutro Abs: 1767 cells/uL (ref 1500–7800)
Neutrophils Relative %: 28.5 %
Platelets: 284 10*3/uL (ref 140–400)
RBC: 4.64 10*6/uL (ref 3.80–5.10)
RDW: 13.1 % (ref 11.0–15.0)
Total Lymphocyte: 58.1 %
WBC: 6.2 10*3/uL (ref 3.8–10.8)

## 2020-07-29 LAB — POCT INR: INR: 2.4 (ref 2.0–3.0)

## 2020-07-29 LAB — URIC ACID: Uric Acid, Serum: 5.5 mg/dL (ref 2.5–7.0)

## 2020-07-29 NOTE — Progress Notes (Signed)
Name: Mary [name]   MRN: [id number]    DOB: 03/24/1951   Date:07/29/2020       Progress Note  Chief Complaint  Patient presents with  . Anticoagulation    1 month recheck     Subjective:   Mary [name] is a 69 y.o. female, presents to clinic for PT/INR check, subtherapeutic, even after recent dose increase last month  06/28/2020 HPI:  Dose increase last week from 5 mg and 2.5 mg every other day, to 5 mg 5/7 d a week and 2.5 mg 2/7d on M and F INR still not at goal Lab Results  Component Value Date   INR 1.9 (A) 06/28/2020   INR 1.6 (A) 06/17/2020   INR 2.0 (H) 11/22/2019   She reports she is compliant with meds No bleeding or SE  We reviewed diet guidelines for pt at last OV, she notes avoiding high vit K foods   Rechecked INR today: Pt notes continued good med compliance No bleeding concerns No new or changing PAD symptoms She checked out pricing on other medications and Eliquis and Xarelto were almost $50 co-pay monthly she can only afford may be $20 for her medicines  Hypertension:  Currently managed on amlodipine 5 mg daily Pt reports good med compliance and denies any SE.   Blood pressure today is well controlled. BP Readings from Last 3 Encounters:  07/29/20 120/76  06/28/20 124/72  06/17/20 120/68   Pt denies CP, SOB, exertional sx, LE edema, palpitation, Ha's, visual disturbances, lightheadedness, hypotension, syncope. Dietary efforts for BP?  Healthy diet She is chronic hypokalemia supplemented with 10 mEq potassium daily Good renal function, last chemistry was in June will repeat those labs today Renal function recent labs:  Lab Results  Component Value Date   GFRAA 88 02/05/2020   GFRAA 94 03/29/2019   GFRAA 93 02/22/2018    Lab Results  Component Value Date   CREATININE 0.81 02/05/2020   BUN 6 (L) 02/05/2020   NA 141 02/05/2020   K 3.7 02/05/2020   CL 104 02/05/2020   CO2 31 02/05/2020   Hyperlipidemia: Currently treated with Lipitor  10 mg daily, pt reports good med compliance Last Lipids: Lab Results  Component Value Date   CHOL 130 02/05/2020   HDL 43 (L) 02/05/2020   LDLCALC 67 02/05/2020   TRIG 112 02/05/2020   CHOLHDL 3.0 02/05/2020   - Denies: Chest pain, shortness of breath, myalgias, claudication       Current Outpatient Medications:  .  acetaminophen (TYLENOL) 500 MG tablet, Take 500 mg by mouth 2 (two) times daily as needed for moderate pain or headache., Disp: , Rfl:  .  amLODipine (NORVASC) 5 MG tablet, Take 1 tablet (5 mg total) by mouth daily. (Patient taking differently: Take 5 mg by mouth in the morning. ), Disp: 90 tablet, Rfl: 3 .  atorvastatin (LIPITOR) 10 MG tablet, Take 1 tablet (10 mg total) by mouth at bedtime., Disp: 90 tablet, Rfl: 3 .  cholecalciferol (VITAMIN D3) 25 MCG (1000 UT) tablet, Take 1,000 Units by mouth every other day., Disp: , Rfl:  .  potassium chloride (KLOR-CON 10) 10 MEQ tablet, Take 1 tablet (10 mEq total) by mouth 2 (two) times daily., Disp: 8 tablet, Rfl: 0 .  vitamin E 400 UNIT capsule, Take 180 Units by mouth daily. , Disp: , Rfl:  .  warfarin (COUMADIN) 5 MG tablet, Take 5 mg tablet Su, Tues, Wed, Th and Sa and take  1/2 tablet (2.5 mg) Mon, Fri, Disp: 78 tablet, Rfl: 1  Patient Active Problem List   Diagnosis Date Noted  . Anticoagulated on Coumadin 02/05/2020  . History of gout 06/23/2019  . Coronary artery disease 02/16/2019  . Benign neoplasm of cecum   . Benign neoplasm of transverse colon   . Polyp of sigmoid colon   . External hemorrhoids   . Hip arthritis 10/27/2017  . Breast lump on left side at 8 o'clock position 07/08/2017  . Breast lump on right side at 7 o'clock position 11/14/2015  . Encounter for monitoring Coumadin therapy 02/27/2015  . PAD (peripheral artery disease) ([place])   . Hypertension   . Hyperlipidemia   . Tobacco abuse   . Hyperuricemia     Past Surgical History:  Procedure Laterality Date  . ANGIOPLASTY  July 2011   PAD-  popliteal artery  . COLONOSCOPY WITH PROPOFOL N/A 01/13/2018   Procedure: COLONOSCOPY WITH PROPOFOL;  Surgeon: Lin Landsman, MD;  Location[facility] ENDOSCOPY;  Service: Gastroenterology;  Laterality: N/A;  . COLONOSCOPY WITH PROPOFOL N/A 01/14/2018   Procedure: COLONOSCOPY WITH PROPOFOL;  Surgeon: Virgel Manifold, MD;  Location: [facility] ENDOSCOPY;  Service: Endoscopy;  Laterality: N/A;  . COLONOSCOPY WITH PROPOFOL N/A 07/06/2018   Procedure: COLONOSCOPY WITH PROPOFOL;  Surgeon: Virgel Manifold, MD;  Location: [facility] ENDOSCOPY;  Service: Endoscopy;  Laterality: N/A;  . CORONARY ANGIOPLASTY    . JOINT REPLACEMENT    . Leg Bypass Surgery Right 08/2010  . OVARIAN CYST SURGERY  1980s  . POPLITEAL ARTERY STENT Right 05/2010  . POPLITEAL ARTERY STENT Right 07/2010  . TOTAL HIP ARTHROPLASTY Left 10/27/2017   Procedure: TOTAL HIP ARTHROPLASTY;  Surgeon: Earnestine Leys, MD;  Location: [facility] ORS;  Service: Orthopedics;  Laterality: Left;    Family History  Problem Relation Age of Onset  . Arthritis Mother   . Diabetes Mother   . Stroke Mother   . Hypertension Mother   . Dementia Mother   . Cancer Father   . Arthritis Sister   . Seizures Sister   . Diabetes Sister   . Hypertension Sister   . COPD Sister   . Cancer Brother   . Stroke Maternal Grandmother   . Diabetes Paternal Grandmother   . Cancer Sister   . Diabetes Sister   . Arthritis Sister   . Diabetes Brother   . Stroke Brother   . Healthy Brother   . Diabetes Brother   . Stroke Brother   . Cancer Brother   . Heart disease Neg Hx   . Breast cancer Neg Hx     Social History   Tobacco Use  . Smoking status: Current Every Day Smoker    Packs/day: 0.50    Years: 40.00    Pack years: 20.00    Types: Cigarettes  . Smokeless tobacco: Never Used  Vaping Use  . Vaping Use: Never used  Substance Use Topics  . Alcohol use: No  . Drug use: No     No Known Allergies  Health Maintenance  Topic Date Due  . PNA vac Low  Risk Adult (1 of 2 - PCV13) Never done  . MAMMOGRAM  06/25/2020  . COLONOSCOPY  07/06/2020  . INFLUENZA VACCINE  11/28/2020 (Originally 03/31/2020)  . DEXA SCAN  02/04/2021 (Originally 04/07/2018)  . TETANUS/TDAP  09/28/2027  . COVID-19 Vaccine  Completed  . Hepatitis C Screening  Completed    Chart Review Today: I personally reviewed active problem list, medication  list, allergies, family history, social history, health maintenance, notes from last encounter, lab results, imaging with the patient/caregiver today.   Review of Systems  10 Systems reviewed and are negative for acute change except as noted in the HPI.  Objective:   Vitals:   07/29/20 1150  BP: 120/76  Pulse: 96  Resp: 16  Temp: 98.3 F (36.8 C)  TempSrc: Oral  SpO2: 100%  Weight: 128 lb 12.8 oz (58.4 kg)  Height: 5\' 6"  (1.676 m)    Body mass index is 20.79 kg/m.  Physical Exam Vitals and nursing note reviewed.  Constitutional:      General: She is not in acute distress.    Appearance: Normal appearance. She is normal weight. She is not ill-appearing, toxic-appearing or diaphoretic.  HENT:     Head: Normocephalic and atraumatic.     Right Ear: External ear normal.     Left Ear: External ear normal.  Eyes:     General: No scleral icterus.       Right eye: No discharge.        Left eye: No discharge.  Cardiovascular:     Rate and Rhythm: Normal rate and regular rhythm.     Pulses: Normal pulses.     Heart sounds: Normal heart sounds. No murmur heard.  No friction rub. No gallop.   Pulmonary:     Effort: Pulmonary effort is normal. No respiratory distress.     Breath sounds: Normal breath sounds. No stridor. No wheezing or rales.  Abdominal:     General: Bowel sounds are normal.     Palpations: Abdomen is soft.  Musculoskeletal:     Right lower leg: No edema.     Left lower leg: No edema.  Skin:    General: Skin is warm and dry.     Coloration: Skin is not jaundiced or pale.     Findings: No  bruising or erythema.  Neurological:     Mental Status: She is alert. Mental status is at baseline.     Gait: Gait normal.  Psychiatric:        Mood and Affect: Mood normal.        Behavior: Behavior normal.         Assessment & Plan:     ICD-10-CM   1. Subtherapeutic anticoagulation  Z51.81 POCT INR   Z79.01 CBC with Differential/Platelet    COMPLETE METABOLIC PANEL WITH GFR   She is continued Coumadin 5 mg 5 days a week and 2.5 mg 2 days a week recheck of PT/INR today was at goal continue same dosing  2. Anticoagulated on Coumadin  Z79.01 POCT INR    CBC with Differential/Platelet    COMPLETE METABOLIC PANEL WITH GFR   See above  3. PAD (peripheral artery disease) (HCC)  I73.9 POCT INR    COMPLETE METABOLIC PANEL WITH GFR   No change to her symptoms, compliant with Coumadin, statin and her vascular follow-up  4. Mixed hyperlipidemia  J18.8 COMPLETE METABOLIC PANEL WITH GFR   Well-controlled lipids not due until next June, compliant with statin, no myalgias concerns or side effects  5. Primary hypertension  C16 COMPLETE METABOLIC PANEL WITH GFR   Stable, well-controlled, blood pressure at goal today, continue amlodipine 5 mg  6. Encounter for monitoring Coumadin therapy  Z51.81 POCT INR   Z79.01 CBC with Differential/Platelet    COMPLETE METABOLIC PANEL WITH GFR  7. Hyperuricemia  S06.3 COMPLETE METABOLIC PANEL WITH GFR    Uric acid  Recheck uric acid level and renal function  8. History of gout  O14.99 COMPLETE METABOLIC PANEL WITH GFR    Uric acid   No recent flares, last labs were a couple years ago recheck today     F/up in 1 month for PT/INR  6 month routine f/up for HTN, HLD  Delsa Grana, PA-C 07/29/20 11:42 AM

## 2020-08-01 NOTE — Chronic Care Management (AMB) (Signed)
  Care Management   Note  08/01/2020 Name: Mary [name] MRN: [id number] DOB: 15-Nov-1950  Mary [name] is a 69 y.o. year old female who is a primary care patient of Laurell Roof and is actively engaged with the care management team. I reached out to [name] by phone today to assist with re-scheduling a follow up visit with the Pharmacist.  Follow up plan: Telephone appointment with care management team member scheduled for: 09/04/2020  [place] [facility]

## 2020-08-06 ENCOUNTER — Ambulatory Visit: Payer: Medicare HMO | Admitting: Family Medicine

## 2020-08-20 ENCOUNTER — Telehealth: Payer: Medicare HMO

## 2020-08-28 ENCOUNTER — Ambulatory Visit (INDEPENDENT_AMBULATORY_CARE_PROVIDER_SITE_OTHER): Payer: Medicare HMO | Admitting: Family Medicine

## 2020-08-28 ENCOUNTER — Encounter: Payer: Self-pay | Admitting: Family Medicine

## 2020-08-28 ENCOUNTER — Other Ambulatory Visit: Payer: Self-pay

## 2020-08-28 VITALS — BP 120/72 | HR 98 | Temp 98.2°F | Resp 16 | Ht 66.0 in | Wt 125.3 lb

## 2020-08-28 DIAGNOSIS — Z5181 Encounter for therapeutic drug level monitoring: Secondary | ICD-10-CM | POA: Diagnosis not present

## 2020-08-28 DIAGNOSIS — I739 Peripheral vascular disease, unspecified: Secondary | ICD-10-CM

## 2020-08-28 DIAGNOSIS — Z7189 Other specified counseling: Secondary | ICD-10-CM | POA: Diagnosis not present

## 2020-08-28 DIAGNOSIS — Z7185 Encounter for immunization safety counseling: Secondary | ICD-10-CM

## 2020-08-28 DIAGNOSIS — Z7901 Long term (current) use of anticoagulants: Secondary | ICD-10-CM

## 2020-08-28 LAB — POCT INR: INR: 2.5 (ref 2.0–3.0)

## 2020-08-28 NOTE — Progress Notes (Signed)
Name: Mary [name]   MRN: [id number]    DOB: 1950-12-20   Date:08/28/2020       Progress Note  Chief Complaint  Patient presents with   Anticoagulation     Subjective:   Mary [name] is a 70 y.o. female, presents to clinic for PT/INR/coumadin monitoring  Last month she had returned to therapeutic range after dose adjustment  Taking 5 mg PO 5/7 d a week and 2.5 PO 2/7 d a week Today INR 2.5 - at goal (between 2.0-3.0) Results for orders placed or performed in visit on 08/28/20  POCT INR  Result Value Ref Range   INR 2.5 2.0 - 3.0    No problems with taking medications, no bleeding/bruising/petechia, melena, hematochezia  No leg/claudication sx, CP, SOB  She is still staying at home a lot with only her immediate family members and avoiding going out in public  - she is unsure about getting a COVID booster   Current Outpatient Medications:    acetaminophen (TYLENOL) 500 MG tablet, Take 500 mg by mouth 2 (two) times daily as needed for moderate pain or headache., Disp: , Rfl:    amLODipine (NORVASC) 5 MG tablet, Take 1 tablet (5 mg total) by mouth daily. (Patient taking differently: Take 5 mg by mouth in the morning.), Disp: 90 tablet, Rfl: 3   atorvastatin (LIPITOR) 10 MG tablet, Take 1 tablet (10 mg total) by mouth at bedtime., Disp: 90 tablet, Rfl: 3   cholecalciferol (VITAMIN D3) 25 MCG (1000 UT) tablet, Take 1,000 Units by mouth every other day., Disp: , Rfl:    potassium chloride (KLOR-CON 10) 10 MEQ tablet, Take 1 tablet (10 mEq total) by mouth 2 (two) times daily., Disp: 8 tablet, Rfl: 0   vitamin E 400 UNIT capsule, Take 180 Units by mouth daily. , Disp: , Rfl:    warfarin (COUMADIN) 5 MG tablet, Take 5 mg tablet Su, Tues, Wed, Th and Sa and take 1/2 tablet (2.5 mg) Mon, Fri, Disp: 78 tablet, Rfl: 1  Patient Active Problem List   Diagnosis Date Noted   Anticoagulated on Coumadin 02/05/2020   History of gout 06/23/2019   Coronary artery disease 02/16/2019    Benign neoplasm of cecum    Benign neoplasm of transverse colon    Polyp of sigmoid colon    External hemorrhoids    Hip arthritis 10/27/2017   Breast lump on left side at 8 o'clock position 07/08/2017   Breast lump on right side at 7 o'clock position 11/14/2015   Encounter for monitoring Coumadin therapy 02/27/2015   PAD (peripheral artery disease) (HCC)    Hypertension    Hyperlipidemia    Tobacco abuse    Hyperuricemia     Past Surgical History:  Procedure Laterality Date   ANGIOPLASTY  July 2011   PAD- popliteal artery   COLONOSCOPY WITH PROPOFOL N/A 01/13/2018   Procedure: COLONOSCOPY WITH PROPOFOL;  Surgeon: Toney Reil, MD;  [facility];  Service: Gastroenterology;  Laterality: N/A;   COLONOSCOPY WITH PROPOFOL N/A 01/14/2018   Procedure: COLONOSCOPY WITH PROPOFOL;  Surgeon: Pasty Spillers, MD;  Location: [facility] ENDOSCOPY;  Service: Endoscopy;  Laterality: N/A;   COLONOSCOPY WITH PROPOFOL N/A 07/06/2018   Procedure: COLONOSCOPY WITH PROPOFOL;  Surgeon: Pasty Spillers, MD;  Location: [facility] ENDOSCOPY;  Service: Endoscopy;  Laterality: N/A;   CORONARY ANGIOPLASTY     JOINT REPLACEMENT     Leg Bypass Surgery Right 08/2010   OVARIAN CYST SURGERY  1980s   POPLITEAL ARTERY STENT Right 05/2010   POPLITEAL ARTERY STENT Right 07/2010   TOTAL HIP ARTHROPLASTY Left 10/27/2017   Procedure: TOTAL HIP ARTHROPLASTY;  Surgeon: Earnestine Leys, MD;  Location: [facility] ORS;  Service: Orthopedics;  Laterality: Left;    Family History  Problem Relation Age of Onset   Arthritis Mother    Diabetes Mother    Stroke Mother    Hypertension Mother    Dementia Mother    Cancer Father    Arthritis Sister    Seizures Sister    Diabetes Sister    Hypertension Sister    COPD Sister    Cancer Brother    Stroke Maternal Grandmother    Diabetes Paternal Grandmother    Cancer Sister    Diabetes Sister    Arthritis Sister     Diabetes Brother    Stroke Brother    Healthy Brother    Diabetes Brother    Stroke Brother    Cancer Brother    Heart disease Neg Hx    Breast cancer Neg Hx     Social History   Tobacco Use   Smoking status: Current Every Day Smoker    Packs/day: 0.50    Years: 40.00    Pack years: 20.00    Types: Cigarettes   Smokeless tobacco: Never Used  Scientific laboratory technician Use: Never used  Substance Use Topics   Alcohol use: No   Drug use: No     No Known Allergies  Health Maintenance  Topic Date Due   PNA vac Low Risk Adult (1 of 2 - PCV13) Never done   MAMMOGRAM  06/25/2020   COLONOSCOPY (Pts 45-16yrs Insurance coverage will need to be confirmed)  07/06/2020   COVID-19 Vaccine (3 - Booster for Moderna series) 09/27/2020 (Originally 05/27/2020)   INFLUENZA VACCINE  11/28/2020 (Originally 03/31/2020)   DEXA SCAN  02/04/2021 (Originally 04/07/2018)   TETANUS/TDAP  09/28/2027   Hepatitis C Screening  Completed    Chart Review Today: I personally reviewed active problem list, medication list, allergies, family history, social history, health maintenance, notes from last encounter, lab results, imaging with the patient/caregiver today.   Review of Systems  10 Systems reviewed and are negative for acute change except as noted in the HPI.  Objective:   Vitals:   08/28/20 1044  BP: 120/72  Pulse: 98  Resp: 16  Temp: 98.2 F (36.8 C)  TempSrc: Oral  SpO2: 99%  Weight: 125 lb 4.8 oz (56.8 kg)  Height: 5\' 6"  (1.676 m)    Body mass index is 20.22 kg/m.  Physical Exam Vitals and nursing note reviewed.  Constitutional:      General: She is not in acute distress.    Appearance: Normal appearance. She is normal weight. She is not ill-appearing, toxic-appearing or diaphoretic.  HENT:     Head: Normocephalic and atraumatic.     Right Ear: External ear normal.     Left Ear: External ear normal.  Eyes:     General: No scleral icterus.       Right eye: No  discharge.        Left eye: No discharge.  Cardiovascular:     Rate and Rhythm: Normal rate and regular rhythm.     Pulses: Normal pulses.     Heart sounds: Normal heart sounds. No murmur heard. No friction rub. No gallop.   Pulmonary:     Effort: Pulmonary effort is normal.  Breath sounds: Normal breath sounds.  Musculoskeletal:     Right lower leg: No edema.     Left lower leg: No edema.  Skin:    General: Skin is warm and dry.     Coloration: Skin is not jaundiced or pale.     Findings: No bruising or erythema.  Neurological:     Mental Status: She is alert. Mental status is at baseline.     Gait: Gait normal.  Psychiatric:        Mood and Affect: Mood normal.        Behavior: Behavior normal.         Assessment & Plan:    1. Anticoagulated on Coumadin At goal, last renal function good, CBC normal, no bleeding concerns - continue same dosing - POCT INR  2. PAD (peripheral artery disease) (HCC) Stable sx, no change, monitoring, on statin and coumadi  3. Encounter for monitoring Coumadin therapy See #1 - POCT INR  4. Vaccine counseling Discussed at length recommendation for pt to get COVID booster with her comorbidities and age and increase spread locally of COVID cases  5. Counseling on health promotion and disease prevention See above   Return for 6 week f/up Pt/INR.   Delsa Grana, PA-C 08/28/20 11:04 AM

## 2020-09-03 ENCOUNTER — Telehealth: Payer: Self-pay

## 2020-09-03 NOTE — Progress Notes (Signed)
09/03/2020- Called patient to remind of follow up visit with Angelena Sole, [facility] on 09/04/2020 at 11:30 am. Spoke with patient, she is aware she will receive a telephone call for visit.   Billee Cashing, CMA Clinical Pharmacist Assistant [phone]

## 2020-09-04 ENCOUNTER — Ambulatory Visit: Payer: Medicare HMO

## 2020-09-04 DIAGNOSIS — E782 Mixed hyperlipidemia: Secondary | ICD-10-CM

## 2020-09-04 DIAGNOSIS — I10 Essential (primary) hypertension: Secondary | ICD-10-CM

## 2020-09-04 NOTE — Patient Instructions (Signed)
Visit Information It was great speaking with you today!  Please let me know if you have any questions about our visit. Goals Addressed            This Visit's Progress   . Chronic Care Management       CARE PLAN ENTRY (see longitudinal plan of care for additional care plan information)  Current Barriers:  . Chronic Disease Management support, education, and care coordination needs related to Hypertension, Hyperlipidemia, Diabetes, Coronary Artery Disease, Tobacco use, and Peripheral Artery Disease   Hypertension BP Readings from Last 3 Encounters:  04/15/20 (!) 144/78  02/20/20 132/72  02/05/20 132/80   . Pharmacist Clinical Goal(s): o Over the next 90 days, patient will work with PharmD and providers to maintain BP goal <140/90 . Current regimen:  o Amlodipine 5 mg daily  . Interventions: o None . Patient self care activities - Over the next 90 days, patient will: o Check BP weekly, document, and provide at future appointments o Ensure daily salt intake < 2300 mg/day  Hyperlipidemia Lab Results  Component Value Date/Time   LDLCALC 67 02/05/2020 12:13 PM   . Pharmacist Clinical Goal(s): o Over the next 90 days, patient will work with PharmD and providers to maintain LDL goal < 70 . Current regimen:  o Atorvastatin 10mg  daily . Interventions: o None . Patient self care activities - Over the next 90 days, patient will: o Continue to take atorvastatin 10mg  daily  Smoking . Pharmacist Clinical Goal(s) o Over the next 90 days, patient will work with PharmD and providers to stop smoking . Interventions: o Recommend calling [place]uitline at [phone] . Patient self care activities - Over the next 90 days, patient will: o Work with to stop smoking at your pace  Medication management . Pharmacist Clinical Goal(s): o Over the next 90 days, patient will work with PharmD and providers to maintain optimal medication adherence . Current pharmacy:  [facility] . Interventions o Comprehensive medication review performed. o Continue current medication management strategy . Patient self care activities - Over the next 90 days, patient will: o Focus on medication adherence by continuing current practices o Take medications as prescribed o Report any questions or concerns to PharmD and/or provider(s)       The patient verbalized understanding of instructions, educational materials, and care plan provided today and declined offer to receive copy of patient instructions, educational materials, and care plan.   Telephone follow up appointment with pharmacy team member scheduled for: 03/05/20 at 11:00 AM  Sabino Donovan Clinical Pharmacist [facility] [phone]

## 2020-09-04 NOTE — Chronic Care Management (AMB) (Cosign Needed)
Chronic Care Management Pharmacy  Name: Mary [name]  MRN: [id number] DOB: [birth year]  Chief Complaint/ HPI  Mary [name],  70 y.o. , female presents for their Follow-Up CCM visit with the clinical pharmacist via telephone due to COVID-19 Pandemic.  PCP : Delsa Grana, PA-C  Their chronic conditions include: Hypertension, Hyperlipidemia, Diabetes, Coronary Artery Disease, Tobacco use, and Peripheral Artery Disease  Office Visits: 05/23/20: Patient presented to Delsa Grana, PA-C for follow-up. No medication changes made. DEXA Breast cancer screening due.  6/7 HTN, Tapia, BP 132/80 P 90 Wt 132 BMI 21.3, 7 cig/day  Consult Visit: 8/16 PAD, Schnier, BP 144/78 P 90 Wt 127 BMI 20.5, Coumadin mgmt, no surgery  Medications: Outpatient Encounter Medications as of 09/04/2020  Medication Sig  . acetaminophen (TYLENOL) 500 MG tablet Take 500 mg by mouth 2 (two) times daily as needed for moderate pain or headache.  Marland Kitchen amLODipine (NORVASC) 5 MG tablet Take 1 tablet (5 mg total) by mouth daily. (Patient taking differently: Take 5 mg by mouth in the morning.)  . atorvastatin (LIPITOR) 10 MG tablet Take 1 tablet (10 mg total) by mouth at bedtime.  . cholecalciferol (VITAMIN D3) 25 MCG (1000 UT) tablet Take 1,000 Units by mouth every other day.  . potassium chloride (KLOR-CON 10) 10 MEQ tablet Take 1 tablet (10 mEq total) by mouth 2 (two) times daily.  . vitamin E 400 UNIT capsule Take 180 Units by mouth daily.   Marland Kitchen warfarin (COUMADIN) 5 MG tablet Take 5 mg tablet Su, Tues, Wed, Th and Sa and take 1/2 tablet (2.5 mg) Mon, Fri   No facility-administered encounter medications on file as of 09/04/2020.   Current Diagnosis/Assessment:  SDOH Interventions   Flowsheet Row Most Recent Value  SDOH Interventions   Financial Strain Interventions Intervention Not Indicated  Transportation Interventions Intervention Not Indicated     Goals Addressed            This Visit's Progress   . Chronic Care  Management       CARE PLAN ENTRY (see longitudinal plan of care for additional care plan information)  Current Barriers:  . Chronic Disease Management support, education, and care coordination needs related to Hypertension, Hyperlipidemia, Diabetes, Coronary Artery Disease, Tobacco use, and Peripheral Artery Disease   Hypertension BP Readings from Last 3 Encounters:  04/15/20 (!) 144/78  02/20/20 132/72  02/05/20 132/80   . Pharmacist Clinical Goal(s): o Over the next 90 days, patient will work with PharmD and providers to maintain BP goal <140/90 . Current regimen:  o Amlodipine 5 mg daily  . Interventions: o None . Patient self care activities - Over the next 90 days, patient will: o Check BP weekly, document, and provide at future appointments o Ensure daily salt intake < 2300 mg/day  Hyperlipidemia Lab Results  Component Value Date/Time   LDLCALC 67 02/05/2020 12:13 PM   . Pharmacist Clinical Goal(s): o Over the next 90 days, patient will work with PharmD and providers to maintain LDL goal < 70 . Current regimen:  o Atorvastatin 25m daily . Interventions: o None . Patient self care activities - Over the next 90 days, patient will: o Continue to take atorvastatin 176mdaily  Smoking . Pharmacist Clinical Goal(s) o Over the next 90 days, patient will work with PharmD and providers to stop smoking . Interventions: o Recommend calling [place]uitline at [phone] Patient self care activities - Over the next 90 days, patient will: o Work with [place]  stop smoking at your pace  Medication management . Pharmacist Clinical Goal(s): o Over the next 90 days, patient will work with PharmD and providers to maintain optimal medication adherence . Current pharmacy: [facility] . Interventions o Comprehensive medication review performed. o Continue current medication management strategy . Patient self care activities - Over the next 90 days, patient will: o Focus on  medication adherence by continuing current practices o Take medications as prescribed o Report any questions or concerns to PharmD and/or provider(s)      Hypertension   BP goal is:  <140/90  Office blood pressures are  BP Readings from Last 3 Encounters:  08/28/20 120/72  07/29/20 120/76  06/28/20 124/72   Patient checks BP at home daily Patient home BP readings are ranging: 120/85, 120s mainly.   Patient has failed these meds in the past: NA Patient is currently controlled on the following medications:  . Amlodipine 18m daily  We discussed: Blood pressure stable, denies symptoms of hypotension or peripheral edema.   Plan  Continue current medications   Hyperlipidemia   LDL goal < 70  Lipid Panel     Component Value Date/Time   CHOL 130 02/05/2020 1213   CHOL 145 10/17/2015 1017   TRIG 112 02/05/2020 1213   HDL 43 (L) 02/05/2020 1213   HDL 43 10/17/2015 1017   LDLCALC 67 02/05/2020 1213    Hepatic Function Latest Ref Rng & Units 07/29/2020 02/05/2020 03/29/2019  Total Protein 6.1 - 8.1 g/dL 7.3 7.2 7.0  Albumin 3.5 - 5.0 g/dL - - -  AST 10 - 35 U/L '17 17 19  ' ALT 6 - 29 U/L '10 12 9  ' Alk Phosphatase 38 - 126 U/L - - -  Total Bilirubin 0.2 - 1.2 mg/dL 0.3 0.4 0.3     The 10-year ASCVD risk score (Mikey BussingDC Jr., et al., 2013) is: 12%   Values used to calculate the score:     Age: 89years     Sex: Female     Is Non-Hispanic African American: Yes     Diabetic: No     Tobacco smoker: Yes     Systolic Blood Pressure: 1734mmHg     Is BP treated: Yes     HDL Cholesterol: 43 mg/dL     Total Cholesterol: 130 mg/dL   Patient has failed these meds in past: NA Patient is currently controlled on the following medications:  . Atorvastatin 125mdaily  We discussed: At goal; Denies myalgias. Patient would likely qualify for high-intensity statin, but will defer at this time given lipid panel.   Plan  Continue current medications  Diabetes   A1c goal  <6.5%  Recent Relevant Labs: Lab Results  Component Value Date/Time   HGBA1C 6.2 (H) 10/13/2017 01:32 PM    Last diabetic Eye exam: No results found for: HMDIABEYEEXA  Last diabetic Foot exam: No results found for: HMDIABFOOTEX   Checking BG: Never  Recent FBG Readings: NA Recent pre-meal BG readings: NA Recent 2hr PP BG readings:  NA Recent HS BG readings: NA  Patient has failed these meds in past: NA Patient is currently controlled on the following medications: . None  We discussed: diet and exercise extensively  Plan  Continue control with diet and exercise  Tobacco Abuse   Tobacco Status:  Social History   Tobacco Use  Smoking Status Current Every Day Smoker  . Packs/day: 0.50  . Years: 40.00  . Pack years: 20.00  . Types: Cigarettes  Smokeless Tobacco Never Used    Patient smokes Within 30 minutes of waking Patient triggers include: NA On a scale of 1-10, reports MOTIVATION to quit is 1 On a scale of 1-10, reports CONFIDENCE in quitting is 1   Previous quit attempts included: NA Patient is currently uncontrolled:   We discussed:  Patient in pre-contemplative stage of quitting. Counseled patient on importance of cutting back and eventually quitting, but patient wants to "work on it on her own."   Plan  Will re-assess at next visit.   Misc / OTC   . APAP 500 mg twice daily  . Vitamin D3 1000 units every other day  . Potassium 10 mEq twice daily  . Vitamin E 180 units daily  . Warfarin 5 mg 1/2 tablet Mon/Fri, 1 tablet all other days    We discussed: DOACs unaffordable for patient, and would be unable to afford copay while trying to get PAP approved. Patient will likely need to continue with warfarin therapy for anticoagulation at this time.   Plan  Continue current medications   Medication Management   Pt uses [facility] pharmacy for all medications Uses pill box? Yes Pt endorses 100% compliance  We discussed:   Plan  Continue current  medication management strategy  Follow up: 6 month phone visit  [facility] [phone]

## 2020-10-09 ENCOUNTER — Ambulatory Visit (INDEPENDENT_AMBULATORY_CARE_PROVIDER_SITE_OTHER): Payer: Medicare HMO | Admitting: Family Medicine

## 2020-10-09 ENCOUNTER — Encounter: Payer: Self-pay | Admitting: Family Medicine

## 2020-10-09 ENCOUNTER — Other Ambulatory Visit: Payer: Self-pay

## 2020-10-09 VITALS — BP 120/70 | HR 98 | Temp 98.5°F | Resp 18 | Ht 66.0 in | Wt 126.2 lb

## 2020-10-09 DIAGNOSIS — E782 Mixed hyperlipidemia: Secondary | ICD-10-CM

## 2020-10-09 DIAGNOSIS — I739 Peripheral vascular disease, unspecified: Secondary | ICD-10-CM | POA: Diagnosis not present

## 2020-10-09 DIAGNOSIS — Z1231 Encounter for screening mammogram for malignant neoplasm of breast: Secondary | ICD-10-CM

## 2020-10-09 DIAGNOSIS — Z780 Asymptomatic menopausal state: Secondary | ICD-10-CM | POA: Diagnosis not present

## 2020-10-09 DIAGNOSIS — Z720 Tobacco use: Secondary | ICD-10-CM

## 2020-10-09 DIAGNOSIS — Z7901 Long term (current) use of anticoagulants: Secondary | ICD-10-CM

## 2020-10-09 DIAGNOSIS — I10 Essential (primary) hypertension: Secondary | ICD-10-CM

## 2020-10-09 LAB — POCT INR: INR: 2.8 (ref 2.0–3.0)

## 2020-10-09 MED ORDER — WARFARIN SODIUM 5 MG PO TABS: ORAL_TABLET | ORAL | 1 refills | Status: DC

## 2020-10-09 NOTE — Progress Notes (Signed)
Name: Mary [name]   MRN: [id number]    DOB: June 11, 1951   Date:10/09/2020       Progress Note  Chief Complaint  Patient presents with  . Anticoagulation    recheck     Subjective:   Mary [name] is a 70 y.o. female, presents to clinic for PT/INR/coumadin monitoring  F/up again today 6 week coumadin PT INR recheck - at goal today, needs refill- would like 3 month supply - recent dose change with 1 month supply, but INR has been in range for last couple visits, goal range 2-3, today and last ov at goal Current dose coumadin 5 mg 5/7d a week and 2.5 mg 2/7 d a week - good compliance Lab Results  Component Value Date   INR 2.8 10/09/2020   INR 2.5 08/28/2020   INR 2.4 07/29/2020   SE concerns, no bleedings - denies hematuria, melena, hematochezia, bruising, no falls Legs feel good - at baseline. She is compliant with BP meds and cholesterol meds  BP at goal, HTN on norvasc 5 mg BP Readings from Last 3 Encounters:  10/09/20 120/70  08/28/20 120/72  07/29/20 120/76   Lipids and chemistry were checked in Nov and in Sept 2021 she did her physical, she stays compliant with her f/up with specialists Due for recheck of labs and her reg OV in about 3 months  No other concerns or new sx today HLD managed with lipitor  PTINR monitoring hx and prior HPI from 08/28/2020: Last month she had returned to therapeutic range after dose adjustment  Taking 5 mg PO 5/7 d a week and 2.5 PO 2/7 d a week Today INR 2.5 - at goal (between 2.0-3.0)   No problems with taking medications, no bleeding/bruising/petechia, melena, hematochezia  No leg/claudication sx, CP, SOB  She is still staying at home a lot with only her immediate family members and avoiding going out in public  - she is unsure about getting a COVID booster   Current Outpatient Medications:  .  acetaminophen (TYLENOL) 500 MG tablet, Take 500 mg by mouth 2 (two) times daily as needed for moderate pain or headache., Disp: , Rfl:  .   amLODipine (NORVASC) 5 MG tablet, Take 1 tablet (5 mg total) by mouth daily. (Patient taking differently: Take 5 mg by mouth in the morning.), Disp: 90 tablet, Rfl: 3 .  atorvastatin (LIPITOR) 10 MG tablet, Take 1 tablet (10 mg total) by mouth at bedtime., Disp: 90 tablet, Rfl: 3 .  cholecalciferol (VITAMIN D3) 25 MCG (1000 UT) tablet, Take 1,000 Units by mouth every other day., Disp: , Rfl:  .  potassium chloride (KLOR-CON 10) 10 MEQ tablet, Take 1 tablet (10 mEq total) by mouth 2 (two) times daily., Disp: 8 tablet, Rfl: 0 .  vitamin E 400 UNIT capsule, Take 180 Units by mouth daily. , Disp: , Rfl:  .  warfarin (COUMADIN) 5 MG tablet, Take 5 mg tablet Su, Tues, Wed, Th and Sa and take 1/2 tablet (2.5 mg) Mon, Fri, Disp: 78 tablet, Rfl: 1  Patient Active Problem List   Diagnosis Date Noted  . Anticoagulated on Coumadin 02/05/2020  . History of gout 06/23/2019  . Coronary artery disease 02/16/2019  . Benign neoplasm of cecum   . Benign neoplasm of transverse colon   . Polyp of sigmoid colon   . External hemorrhoids   . Hip arthritis 10/27/2017  . Breast lump on left side at 8 o'clock position 07/08/2017  .  Breast lump on right side at 7 o'clock position 11/14/2015  . Encounter for monitoring Coumadin therapy 02/27/2015  . PAD (peripheral artery disease) ([place])   . Hypertension   . Hyperlipidemia   . Tobacco abuse   . Hyperuricemia     Past Surgical History:  Procedure Laterality Date  . ANGIOPLASTY  July 2011   PAD- popliteal artery  . COLONOSCOPY WITH PROPOFOL N/A 01/13/2018   Procedure: COLONOSCOPY WITH PROPOFOL;  Surgeon: Lin Landsman, MD;  Location: [facility] ENDOSCOPY;  Service: Gastroenterology;  Laterality: N/A;  . COLONOSCOPY WITH PROPOFOL N/A 01/14/2018   Procedure: COLONOSCOPY WITH PROPOFOL;  Surgeon: Virgel Manifold, MD;  Location: [facility] ENDOSCOPY;  Service: Endoscopy;  Laterality: N/A;  . COLONOSCOPY WITH PROPOFOL N/A 07/06/2018   Procedure: COLONOSCOPY WITH  PROPOFOL;  Surgeon: Virgel Manifold, MD;  Location: [facility] ENDOSCOPY;  Service: Endoscopy;  Laterality: N/A;  . CORONARY ANGIOPLASTY    . JOINT REPLACEMENT    . Leg Bypass Surgery Right 08/2010  . OVARIAN CYST SURGERY  1980s  . POPLITEAL ARTERY STENT Right 05/2010  . POPLITEAL ARTERY STENT Right 07/2010  . TOTAL HIP ARTHROPLASTY Left 10/27/2017   Procedure: TOTAL HIP ARTHROPLASTY;  Surgeon: Earnestine Leys, MD;  Location: [facility] ORS;  Service: Orthopedics;  Laterality: Left;    Family History  Problem Relation Age of Onset  . Arthritis Mother   . Diabetes Mother   . Stroke Mother   . Hypertension Mother   . Dementia Mother   . Cancer Father   . Arthritis Sister   . Seizures Sister   . Diabetes Sister   . Hypertension Sister   . COPD Sister   . Cancer Brother   . Stroke Maternal Grandmother   . Diabetes Paternal Grandmother   . Cancer Sister   . Diabetes Sister   . Arthritis Sister   . Diabetes Brother   . Stroke Brother   . Healthy Brother   . Diabetes Brother   . Stroke Brother   . Cancer Brother   . Heart disease Neg Hx   . Breast cancer Neg Hx     Social History   Tobacco Use  . Smoking status: Current Every Day Smoker    Packs/day: 0.50    Years: 40.00    Pack years: 20.00    Types: Cigarettes  . Smokeless tobacco: Never Used  Vaping Use  . Vaping Use: Never used  Substance Use Topics  . Alcohol use: No  . Drug use: No     No Known Allergies  Health Maintenance  Topic Date Due  . COVID-19 Vaccine (3 - Booster for Moderna series) 05/27/2020  . INFLUENZA VACCINE  11/28/2020 (Originally 03/31/2020)  . DEXA SCAN  02/04/2021 (Originally 04/07/2018)  . MAMMOGRAM  08/28/2021 (Originally 06/25/2020)  . COLONOSCOPY (Pts 45-41yrs Insurance coverage will need to be confirmed)  08/28/2021 (Originally 07/06/2020)  . PNA vac Low Risk Adult (1 of 2 - PCV13) 08/28/2021 (Originally 04/07/2018)  . TETANUS/TDAP  09/28/2027  . Hepatitis C Screening  Completed    Chart  Review Today: I personally reviewed active problem list, medication list, allergies, family history, social history, health maintenance, notes from last encounter, lab results, imaging with the patient/caregiver today.   Review of Systems  Constitutional: Negative.   HENT: Negative.   Eyes: Negative.   Respiratory: Negative.   Cardiovascular: Negative.   Gastrointestinal: Negative.   Endocrine: Negative.   Genitourinary: Negative.   Musculoskeletal: Negative.   Skin: Negative.   Allergic/Immunologic:  Negative.   Neurological: Negative.   Hematological: Negative.   Psychiatric/Behavioral: Negative.   All other systems reviewed and are negative.   10 Systems reviewed and are negative for acute change except as noted in the HPI.  Objective:   Vitals:   10/09/20 1022  BP: 120/70  Pulse: 98  Resp: 18  Temp: 98.5 F (36.9 C)  TempSrc: Oral  SpO2: 99%  Weight: 126 lb 3.2 oz (57.2 kg)  Height: 5\' 6"  (1.676 m)    Body mass index is 20.37 kg/m.  Physical Exam Vitals and nursing note reviewed.  Constitutional:      General: She is not in acute distress.    Appearance: Normal appearance. She is obese. She is not ill-appearing, toxic-appearing or diaphoretic.  HENT:     Head: Normocephalic and atraumatic.     Right Ear: External ear normal.     Left Ear: External ear normal.  Eyes:     General: No scleral icterus.       Right eye: No discharge.        Left eye: No discharge.  Cardiovascular:     Rate and Rhythm: Normal rate and regular rhythm.     Pulses: Normal pulses.     Heart sounds: Normal heart sounds. No murmur heard. No friction rub. No gallop.   Pulmonary:     Effort: Pulmonary effort is normal. No respiratory distress.     Breath sounds: Normal breath sounds. No stridor. No wheezing, rhonchi or rales.  Skin:    General: Skin is warm and dry.     Capillary Refill: Capillary refill takes less than 2 seconds.     Coloration: Skin is not jaundiced or pale.      Findings: No bruising.  Neurological:     Mental Status: She is alert. Mental status is at baseline.     Gait: Gait normal.  Psychiatric:        Mood and Affect: Mood normal.        Behavior: Behavior normal.     Lab Results  Component Value Date   INR 2.8 10/09/2020   INR 2.5 08/28/2020   INR 2.4 07/29/2020   Renal function recent labs:  Lab Results  Component Value Date   GFRAA 81 07/29/2020   GFRAA 88 02/05/2020   GFRAA 94 03/29/2019    Lab Results  Component Value Date   CREATININE 0.86 07/29/2020   BUN 7 07/29/2020   NA 141 07/29/2020   K 3.6 07/29/2020   CL 103 07/29/2020   CO2 32 07/29/2020   Hemoglobin  Date Value Ref Range Status  07/29/2020 14.0 11.7 - 15.5 g/dL Final  02/05/2020 13.9 11.7 - 15.5 g/dL Final  02/22/2018 13.8 11.7 - 15.5 g/dL Final  10/30/2017 12.0 12.0 - 16.0 g/dL Final  10/17/2015 13.8 11.1 - 15.9 g/dL Final  02/27/2015 13.9 11.1 - 15.9 g/dL Final    Reminded pt of mammogram and dexa - ordered - pt reminded to call and schedule Health Maintenance  Topic Date Due  . COVID-19 Vaccine (3 - Booster for Moderna series) 05/27/2020  . INFLUENZA VACCINE  11/28/2020 (Originally 03/31/2020)  . DEXA SCAN  02/04/2021 (Originally 07-Oct-1950)  . MAMMOGRAM  08/28/2021 (Originally 06/25/2020)  . COLONOSCOPY (Pts 45-40yrs Insurance coverage will need to be confirmed)  08/28/2021 (Originally 07/06/2020)  . PNA vac Low Risk Adult (1 of 2 - PCV13) 08/28/2021 (Originally 04/07/2018)  . TETANUS/TDAP  09/28/2027  . Hepatitis C Screening  Completed  Chart reviewed today, most recent labs, imaging results and HM list/due preventative med/gaps reviewed personally by me today - more than 10 min chart review      Assessment & Plan:      ICD-10-CM   1. Anticoagulated on Coumadin  Z79.01 POCT INR    warfarin (COUMADIN) 5 MG tablet    DISCONTINUED: warfarin (COUMADIN) 5 MG tablet   we discussed other drug options to avoid the PT/INR monitoring - drug  information given, she has adequate renal function, do not see other contraindications  2. Encounter for current long-term use of anticoagulants  Z79.01   3. PAD (peripheral artery disease) ([facility])  I73.9 POCT INR    warfarin (COUMADIN) 5 MG tablet    DISCONTINUED: warfarin (COUMADIN) 5 MG tablet   no change in sx, stable, chronic, per vascular specialists, last OV reviewed, pt compliant with her f/up/meds/statin - doing well  4. Primary hypertension  I10    BP at goal - good compliance with norvasc  5. Mixed hyperlipidemia  E78.2    compliant with statin, no SE or concerns  6. Tobacco abuse  Z72.0    reduced use, not interested in quitting all the way, offered resources  7. Postmenopausal estrogen deficiency  Z78.0    due - previously ordered, reminded to call and schedule dexa  8. Encounter for screening mammogram for malignant neoplasm of breast  Z12.31    due - previously ordered, reminded to call and schedule mammo   Spent more than 15 min with patient, 10+ on chart review as noted above and 5+ for chart documentation, more than 30+ min spent on encounter  Return for 6 week PTINR f/up and then 3 month routine f/up HTN/HLD/PTINR.    Delsa Grana, PA-C 10/09/20 10:35 AM

## 2020-10-17 ENCOUNTER — Ambulatory Visit (INDEPENDENT_AMBULATORY_CARE_PROVIDER_SITE_OTHER): Payer: Medicare HMO | Admitting: Vascular Surgery

## 2020-10-17 ENCOUNTER — Encounter (INDEPENDENT_AMBULATORY_CARE_PROVIDER_SITE_OTHER): Payer: Medicare HMO

## 2020-10-24 ENCOUNTER — Telehealth: Payer: Self-pay

## 2020-10-24 NOTE — Progress Notes (Signed)
Chronic Care Management Pharmacy Assistant   Name: Mary [name]  MRN: [id number] DOB: 07/30/1951  Reason for Encounter:Hypertension  Disease State and Medication Review/General Adherence call.  Patient Questions:  1.  Have you seen any other providers since your last visit? Yes, 10/09/2020 PCP Delsa Grana     PCP : Delsa Grana, PA-C  Allergies:  No Known Allergies  Medications: Outpatient Encounter Medications as of 10/24/2020  Medication Sig  . acetaminophen (TYLENOL) 500 MG tablet Take 500 mg by mouth 2 (two) times daily as needed for moderate pain or headache.  Marland Kitchen amLODipine (NORVASC) 5 MG tablet Take 1 tablet (5 mg total) by mouth daily. (Patient taking differently: Take 5 mg by mouth in the morning.)  . atorvastatin (LIPITOR) 10 MG tablet Take 1 tablet (10 mg total) by mouth at bedtime.  . cholecalciferol (VITAMIN D3) 25 MCG (1000 UT) tablet Take 1,000 Units by mouth every other day.  . potassium chloride (KLOR-CON 10) 10 MEQ tablet Take 1 tablet (10 mEq total) by mouth 2 (two) times daily.  . vitamin E 400 UNIT capsule Take 180 Units by mouth daily.   Marland Kitchen warfarin (COUMADIN) 5 MG tablet Take 5 mg tablet Su, Tues, Wed, Th and Sa and take 1/2 tablet (2.5 mg) Mon, Fri   No facility-administered encounter medications on file as of 10/24/2020.    Current Diagnosis: Patient Active Problem List   Diagnosis Date Noted  . Anticoagulated on Coumadin 02/05/2020  . History of gout 06/23/2019  . Coronary artery disease 02/16/2019  . Benign neoplasm of cecum   . Benign neoplasm of transverse colon   . Polyp of sigmoid colon   . External hemorrhoids   . Hip arthritis 10/27/2017  . Breast lump on left side at 8 o'clock position 07/08/2017  . Breast lump on right side at 7 o'clock position 11/14/2015  . Encounter for monitoring Coumadin therapy 02/27/2015  . PAD (peripheral artery disease) ([place])   . Primary hypertension   . Mixed hyperlipidemia   . Tobacco abuse   . Hyperuricemia      Goals Addressed   None    Reviewed chart prior to disease state call. Spoke with patient regarding BP  Recent Office Vitals: BP Readings from Last 3 Encounters:  10/09/20 120/70  08/28/20 120/72  07/29/20 120/76   Pulse Readings from Last 3 Encounters:  10/09/20 98  08/28/20 98  07/29/20 96    Wt Readings from Last 3 Encounters:  10/09/20 126 lb 3.2 oz (57.2 kg)  08/28/20 125 lb 4.8 oz (56.8 kg)  07/29/20 128 lb 12.8 oz (58.4 kg)     Kidney Function Lab Results  Component Value Date/Time   CREATININE 0.86 07/29/2020 11:57 AM   CREATININE 0.81 02/05/2020 12:13 PM   GFRNONAA 70 07/29/2020 11:57 AM   GFRAA 81 07/29/2020 11:57 AM    BMP Latest Ref Rng & Units 07/29/2020 02/05/2020 03/29/2019  Glucose 65 - 99 mg/dL 54(L) 80 83  BUN 7 - 25 mg/dL 7 6(L) 9  Creatinine 0.50 - 0.99 mg/dL 0.86 0.81 0.77  BUN/Creat Ratio 6 - 22 (calc) NOT APPLICABLE 7 NOT APPLICABLE  Sodium 027 - 146 mmol/L 141 141 140  Potassium 3.5 - 5.3 mmol/L 3.6 3.7 3.7  Chloride 98 - 110 mmol/L 103 104 103  CO2 20 - 32 mmol/L 32 31 24  Calcium 8.6 - 10.4 mg/dL 9.7 9.7 9.4    . Current antihypertensive regimen:  ? Amlodipine 5 mg daily  .  Marland Kitchen  What recent interventions/DTPs have been made by any provider to improve Blood Pressure control since last CPP Visit: None ID . Any recent hospitalizations or ED visits since last visit with CPP? No     Adherence Review: Is the patient currently on ACE/ARB medication? No Does the patient have >5 day gap between last estimated fill dates? Yes  I have attempted without success to contact this patient by phone three times to do her Hypertension  Disease State call. I left a Voice message for patient to return my call.  Left voice message on  02/24,02/25,02/28 Mary [name]  Follow-Up:  Pharmacist Review   Anderson [place] Clinical Pharmacist Assistant [phone]

## 2020-11-01 DIAGNOSIS — L821 Other seborrheic keratosis: Secondary | ICD-10-CM | POA: Diagnosis not present

## 2020-11-01 DIAGNOSIS — L720 Epidermal cyst: Secondary | ICD-10-CM | POA: Diagnosis not present

## 2020-11-20 ENCOUNTER — Ambulatory Visit: Payer: Medicare HMO | Admitting: Family Medicine

## 2020-12-20 ENCOUNTER — Other Ambulatory Visit: Payer: Self-pay | Admitting: Family Medicine

## 2020-12-20 DIAGNOSIS — Z1231 Encounter for screening mammogram for malignant neoplasm of breast: Secondary | ICD-10-CM

## 2020-12-27 ENCOUNTER — Other Ambulatory Visit: Payer: Self-pay

## 2020-12-27 ENCOUNTER — Ambulatory Visit
Admission: RE | Admit: 2020-12-27 | Discharge: 2020-12-27 | Disposition: A | Discharge status: Home / Self Care | Payer: Medicare HMO | Source: Ambulatory Visit | Attending: Family Medicine | Admitting: Family Medicine

## 2020-12-27 DIAGNOSIS — Z1231 Encounter for screening mammogram for malignant neoplasm of breast: Secondary | ICD-10-CM

## 2021-01-07 ENCOUNTER — Ambulatory Visit: Payer: Medicare HMO | Admitting: Family Medicine

## 2021-01-09 ENCOUNTER — Ambulatory Visit (INDEPENDENT_AMBULATORY_CARE_PROVIDER_SITE_OTHER): Payer: Medicare HMO | Admitting: Family Medicine

## 2021-01-09 ENCOUNTER — Other Ambulatory Visit: Payer: Self-pay

## 2021-01-09 ENCOUNTER — Encounter: Payer: Self-pay | Admitting: Family Medicine

## 2021-01-09 VITALS — BP 112/72 | HR 97 | Temp 98.4°F | Resp 16 | Ht 66.0 in | Wt 124.9 lb

## 2021-01-09 DIAGNOSIS — Z8739 Personal history of other diseases of the musculoskeletal system and connective tissue: Secondary | ICD-10-CM

## 2021-01-09 DIAGNOSIS — Z1211 Encounter for screening for malignant neoplasm of colon: Secondary | ICD-10-CM

## 2021-01-09 DIAGNOSIS — I10 Essential (primary) hypertension: Secondary | ICD-10-CM

## 2021-01-09 DIAGNOSIS — Z7901 Long term (current) use of anticoagulants: Secondary | ICD-10-CM | POA: Diagnosis not present

## 2021-01-09 DIAGNOSIS — I739 Peripheral vascular disease, unspecified: Secondary | ICD-10-CM

## 2021-01-09 DIAGNOSIS — Z5181 Encounter for therapeutic drug level monitoring: Secondary | ICD-10-CM

## 2021-01-09 DIAGNOSIS — I2510 Atherosclerotic heart disease of native coronary artery without angina pectoris: Secondary | ICD-10-CM | POA: Diagnosis not present

## 2021-01-09 DIAGNOSIS — M810 Age-related osteoporosis without current pathological fracture: Secondary | ICD-10-CM

## 2021-01-09 DIAGNOSIS — E782 Mixed hyperlipidemia: Secondary | ICD-10-CM

## 2021-01-09 DIAGNOSIS — Z780 Asymptomatic menopausal state: Secondary | ICD-10-CM

## 2021-01-09 DIAGNOSIS — E790 Hyperuricemia without signs of inflammatory arthritis and tophaceous disease: Secondary | ICD-10-CM | POA: Diagnosis not present

## 2021-01-09 DIAGNOSIS — Z720 Tobacco use: Secondary | ICD-10-CM

## 2021-01-09 LAB — POCT INR: INR: 2.5 (ref 2.0–3.0)

## 2021-01-09 NOTE — Patient Instructions (Addendum)
Health Maintenance  Topic Date Due  . COVID-19 Vaccine (3 - Booster for Moderna series) 04/26/2020  . DEXA scan (bone density measurement)  02/04/2021*  . Colon Cancer Screening  08/28/2021*  . Flu Shot  03/31/2021  . Mammogram  12/27/2021  . Tetanus Vaccine  09/28/2027  . Hepatitis C Screening: USPSTF Recommendation to screen - Ages 90-68 yo.  Completed  . HPV Vaccine  Aged Out  . Pneumonia vaccines  Discontinued  *Topic was postponed. The date shown is not the original due date.   [facility] [place],  [place]  [address] Get Driving Directions Main: [phone]  Please call to schedule your bone density test  Follow up with [facility] for your overdue colon cancer screening and monitoring for polyps and family history.  Team Member Role and Specialty Contact Info Address  PCPs     Laurell Roof General (Family Medicine) Phone: [phone] Fax: [phone]  [address] [place] [address]  Additional Team Members     [facility] (Pharmacist) - -  Yolonda Kida, MD Consulting Physician (Cardiology) Phone: [phone] Fax: [phone]  [address] [place] [address]  Virgel Manifold, MD Consulting Physician (Gastroenterology) Phone: [phone] Fax: [phone]  [address] [place] [address]  Algernon Huxley, MD Referring Physician (Vascular Surgery) Phone: [phone] Fax: [phone]  [address] [place] [address]

## 2021-01-09 NOTE — Progress Notes (Signed)
Name: Mary [name]   MRN: [id number]    DOB: 10-21-1950   Date:01/09/2021       Progress Note  Chief Complaint  Patient presents with  . Hypertension  . Coagulation Disorder     Subjective:   Mary [name] is a 70 y.o. female, presents to clinic for PT/INR/coumadin monitoring and routine f/up  F/up again today 6 week coumadin PT INR recheck - reviewed, at goal again today INR has been in range for last couple visits, goal range 2-3, today and last ov at goal Current dose coumadin 5 mg 5/7d a week and 2.5 mg 2/7 d a week - good compliance Lab Results  Component Value Date   INR 2.5 01/09/2021   INR 2.8 10/09/2020   INR 2.5 08/28/2020  no change - SE concerns, no bleedings - denies hematuria, melena, hematochezia, bruising, no falls Legs feel good - at baseline. She is compliant with BP meds and cholesterol meds  Hypertension:  Currently managed on norvasc 5 mg Pt reports good med compliance and denies any SE.   Blood pressure today is well controlled. BP Readings from Last 3 Encounters:  01/09/21 112/72  10/09/20 120/70  08/28/20 120/72   Pt denies CP, SOB, exertional sx, LE edema, palpitation, Ha's, visual disturbances, lightheadedness, hypotension, syncope.  Hyperlipidemia: Currently treated with lipitor, pt reports good med compliance Last Lipids: Lab Results  Component Value Date   CHOL 130 02/05/2020   HDL 43 (L) 02/05/2020   LDLCALC 67 02/05/2020   TRIG 112 02/05/2020   CHOLHDL 3.0 02/05/2020   - Denies: Chest pain, shortness of breath, myalgias, claudication  She follows with vascular specialists and cardiology, reviewed most recent f/up visits - no new findings or tx management changes She needs to reschedule vascular f/up, she doesn't want to go back to cardiology - Dr. Clayborn Bigness - she'll "go if she's having problems" no exertional CP or SOB  Continued smoking - declines to discuss smoking cessation today  Due for dexa on Vit D supplement, no labs or  deficiency in the past, no fx   Current Outpatient Medications:  .  acetaminophen (TYLENOL) 500 MG tablet, Take 500 mg by mouth 2 (two) times daily as needed for moderate pain or headache., Disp: , Rfl:  .  amLODipine (NORVASC) 5 MG tablet, Take 1 tablet (5 mg total) by mouth daily. (Patient taking differently: Take 5 mg by mouth in the morning.), Disp: 90 tablet, Rfl: 3 .  atorvastatin (LIPITOR) 10 MG tablet, Take 1 tablet (10 mg total) by mouth at bedtime., Disp: 90 tablet, Rfl: 3 .  cholecalciferol (VITAMIN D3) 25 MCG (1000 UT) tablet, Take 1,000 Units by mouth every other day., Disp: , Rfl:  .  potassium chloride (KLOR-CON 10) 10 MEQ tablet, Take 1 tablet (10 mEq total) by mouth 2 (two) times daily., Disp: 8 tablet, Rfl: 0 .  vitamin E 400 UNIT capsule, Take 180 Units by mouth daily. , Disp: , Rfl:  .  warfarin (COUMADIN) 5 MG tablet, Take 5 mg tablet Su, Tues, Wed, Th and Sa and take 1/2 tablet (2.5 mg) Mon, Fri, Disp: 234 tablet, Rfl: 1  Patient Active Problem List   Diagnosis Date Noted  . Anticoagulated on Coumadin 02/05/2020  . History of gout 06/23/2019  . Coronary artery disease 02/16/2019  . Benign neoplasm of cecum   . Benign neoplasm of transverse colon   . Polyp of sigmoid colon   . External hemorrhoids   . Hip  arthritis 10/27/2017  . Breast lump on left side at 8 o'clock position 07/08/2017  . Breast lump on right side at 7 o'clock position 11/14/2015  . Encounter for monitoring Coumadin therapy 02/27/2015  . PAD (peripheral artery disease) ([place])   . Primary hypertension   . Mixed hyperlipidemia   . Tobacco abuse   . Hyperuricemia     Past Surgical History:  Procedure Laterality Date  . ANGIOPLASTY  July 2011   PAD- popliteal artery  . COLONOSCOPY WITH PROPOFOL N/A 01/13/2018   Procedure: COLONOSCOPY WITH PROPOFOL;  Surgeon: Lin Landsman, MD;  Location: [facility] ENDOSCOPY;  Service: Gastroenterology;  Laterality: N/A;  . COLONOSCOPY WITH PROPOFOL N/A 01/14/2018    Procedure: COLONOSCOPY WITH PROPOFOL;  Surgeon: Virgel Manifold, MD;  Location: [facility] ENDOSCOPY;  Service: Endoscopy;  Laterality: N/A;  . COLONOSCOPY WITH PROPOFOL N/A 07/06/2018   Procedure: COLONOSCOPY WITH PROPOFOL;  Surgeon: Virgel Manifold, MD;  Location: [facility] ENDOSCOPY;  Service: Endoscopy;  Laterality: N/A;  . CORONARY ANGIOPLASTY    . JOINT REPLACEMENT    . Leg Bypass Surgery Right 08/2010  . OVARIAN CYST SURGERY  1980s  . POPLITEAL ARTERY STENT Right 05/2010  . POPLITEAL ARTERY STENT Right 07/2010  . TOTAL HIP ARTHROPLASTY Left 10/27/2017   Procedure: TOTAL HIP ARTHROPLASTY;  Surgeon: Earnestine Leys, MD;  Location: [facility] ORS;  Service: Orthopedics;  Laterality: Left;    Family History  Problem Relation Age of Onset  . Arthritis Mother   . Diabetes Mother   . Stroke Mother   . Hypertension Mother   . Dementia Mother   . Cancer Father   . Arthritis Sister   . Seizures Sister   . Diabetes Sister   . Hypertension Sister   . COPD Sister   . Cancer Brother   . Stroke Maternal Grandmother   . Diabetes Paternal Grandmother   . Cancer Sister   . Diabetes Sister   . Arthritis Sister   . Diabetes Brother   . Stroke Brother   . Healthy Brother   . Diabetes Brother   . Stroke Brother   . Cancer Brother   . Heart disease Neg Hx   . Breast cancer Neg Hx     Social History   Tobacco Use  . Smoking status: Current Every Day Smoker    Packs/day: 0.50    Years: 40.00    Pack years: 20.00    Types: Cigarettes  . Smokeless tobacco: Never Used  Vaping Use  . Vaping Use: Never used  Substance Use Topics  . Alcohol use: No  . Drug use: No     No Known Allergies  Health Maintenance  Topic Date Due  . Fecal DNA (Cologuard)  Never done  . COVID-19 Vaccine (3 - Booster for Moderna series) 04/26/2020  . DEXA SCAN  02/04/2021 (Originally 07/30/1951)  . INFLUENZA VACCINE  03/31/2021  . MAMMOGRAM  12/27/2021  . TETANUS/TDAP  09/28/2027  . Hepatitis C Screening   Completed  . HPV VACCINES  Aged Out  . PNA vac Low Risk Adult  Discontinued    Chart Review Today: I personally reviewed active problem list, medication list, allergies, family history, social history, health maintenance, notes from last encounter, lab results, imaging with the patient/caregiver today.   Review of Systems  Constitutional: Negative.   HENT: Negative.   Eyes: Negative.   Respiratory: Negative.   Cardiovascular: Negative.   Gastrointestinal: Negative.   Endocrine: Negative.   Genitourinary: Negative.   Musculoskeletal: Negative.  Skin: Negative.   Allergic/Immunologic: Negative.   Neurological: Negative.   Hematological: Negative.   Psychiatric/Behavioral: Negative.   All other systems reviewed and are negative. -other than noted as acute changes in HPI   Objective:   Vitals:   01/09/21 1101  BP: 112/72  Pulse: 97  Resp: 16  Temp: 98.4 F (36.9 C)  SpO2: 97%  Weight: 124 lb 14.4 oz (56.7 kg)  Height: 5\' 6"  (1.676 m)    Body mass index is 20.16 kg/m.  Physical Exam Vitals and nursing note reviewed.  Constitutional:      General: She is not in acute distress.    Appearance: Normal appearance. She is normal weight. She is not ill-appearing, toxic-appearing or diaphoretic.  HENT:     Head: Normocephalic and atraumatic.     Right Ear: External ear normal.     Left Ear: External ear normal.  Eyes:     General: No scleral icterus.       Right eye: No discharge.        Left eye: No discharge.     Conjunctiva/sclera: Conjunctivae normal.  Cardiovascular:     Rate and Rhythm: Normal rate and regular rhythm.     Pulses: Normal pulses.     Heart sounds: Normal heart sounds. No murmur heard. No friction rub. No gallop.   Pulmonary:     Effort: Pulmonary effort is normal. No respiratory distress.     Breath sounds: Normal breath sounds. No stridor. No wheezing, rhonchi or rales.  Abdominal:     General: Bowel sounds are normal.     Palpations:  Abdomen is soft.  Musculoskeletal:     Right lower leg: No edema.     Left lower leg: No edema.  Skin:    General: Skin is warm and dry.     Coloration: Skin is not jaundiced or pale.     Findings: No bruising.  Neurological:     Mental Status: She is alert. Mental status is at baseline.     Gait: Gait normal.  Psychiatric:        Mood and Affect: Mood normal.        Behavior: Behavior normal.     Labs personally reviewed today by me Lab Results  Component Value Date   INR 2.5 01/09/2021   INR 2.8 10/09/2020   INR 2.5 08/28/2020   Renal function recent labs:  Lab Results  Component Value Date   GFRAA 81 07/29/2020   GFRAA 88 02/05/2020   GFRAA 94 03/29/2019    Lab Results  Component Value Date   CREATININE 0.86 07/29/2020   BUN 7 07/29/2020   NA 141 07/29/2020   K 3.6 07/29/2020   CL 103 07/29/2020   CO2 32 07/29/2020   Hemoglobin  Date Value Ref Range Status  07/29/2020 14.0 11.7 - 15.5 g/dL Final  02/05/2020 13.9 11.7 - 15.5 g/dL Final  02/22/2018 13.8 11.7 - 15.5 g/dL Final  10/30/2017 12.0 12.0 - 16.0 g/dL Final  10/17/2015 13.8 11.1 - 15.9 g/dL Final  02/27/2015 13.9 11.1 - 15.9 g/dL Final    Reminded pt of mammogram and dexa - ordered - pt reminded to call and schedule Health Maintenance  Topic Date Due  . Fecal DNA (Cologuard)  Never done  . COVID-19 Vaccine (3 - Booster for Moderna series) 04/26/2020  . DEXA SCAN  02/04/2021 (Originally [birth year])  . INFLUENZA VACCINE  03/31/2021  . MAMMOGRAM  12/27/2021  . TETANUS/TDAP  09/28/2027  .  Hepatitis C Screening  Completed  . HPV VACCINES  Aged Out  . PNA vac Low Risk Adult  Discontinued    Chart reviewed today, most recent labs, imaging results and HM list/due preventative med/gaps reviewed personally by me today - more than 10 min chart review    Assessment & Plan:   1. Primary hypertension Stable, well controlled on norvasc 5 mg, continue med, diet, lifestyle efforts - COMPLETE METABOLIC  PANEL WITH GFR  2. Mixed hyperlipidemia Compliant with meds, no SE, no myalgias, fatigue or jaundice Due for FLP and recheck CMP Diet and exercise recommendations for HLD reviewed  - Lipid panel - COMPLETE METABOLIC PANEL WITH GFR  3. Anticoagulated on Coumadin At goal today, continue same dose, no bleeding concerns - POCT INR - COMPLETE METABOLIC PANEL WITH GFR - CBC with Differential/Platelet  4. Encounter for current long-term use of anticoagulants - POCT INR  5. PAD (peripheral artery disease) (HCC) No change in baseline sx, needs to schedule her vascular f/up - Lipid panel - COMPLETE METABOLIC PANEL WITH GFR - CBC with Differential/Platelet  6. Screening for colon cancer F/up with [facility] - CBC with Differential/Platelet  7. Coronary artery disease involving native coronary artery of native heart, unspecified whether angina present Denies any hx of CAD/stent, denies exertional sx Sees [facility] - Lipid panel - COMPLETE METABOLIC PANEL WITH GFR - CBC with Differential/Platelet  8. Tobacco abuse Continued smoking, declined to discuss today  9. Hyperuricemia Recheck labs, not currently on meds - COMPLETE METABOLIC PANEL WITH GFR - Uric acid  10. Medication monitoring encounter - Lipid panel - COMPLETE METABOLIC PANEL WITH GFR - CBC with Differential/Platelet  11. Osteoporosis, unspecified osteoporosis type, unspecified pathological fracture presence Hx of?  Not validated in chart - due for dexa - VITAMIN D 25 Hydroxy (Vit-D Deficiency, Fractures)  12. Postmenopausal estrogen deficiency - VITAMIN D 25 Hydroxy (Vit-D Deficiency, Fractures) - DG Bone Density; Future  13. History of gout See above - COMPLETE METABOLIC PANEL WITH GFR - Uric acid      Return for 6 wks for [facility] recheck, 6 months for routine f/up.    Delsa Grana, PA-C 01/09/21 11:06 AM

## 2021-01-10 ENCOUNTER — Other Ambulatory Visit: Payer: Self-pay | Admitting: Family Medicine

## 2021-01-10 LAB — CBC WITH DIFFERENTIAL/PLATELET
Absolute Monocytes: 466 cells/uL (ref 200–950)
Basophils Absolute: 50 cells/uL (ref 0–200)
Basophils Relative: 0.8 %
Eosinophils Absolute: 170 cells/uL (ref 15–500)
Eosinophils Relative: 2.7 %
HCT: 39.9 % (ref 35.0–45.0)
Hemoglobin: 13.5 g/dL (ref 11.7–15.5)
Lymphs Abs: 3717 cells/uL (ref 850–3900)
MCH: 29.7 pg (ref 27.0–33.0)
MCHC: 33.8 g/dL (ref 32.0–36.0)
MCV: 87.7 fL (ref 80.0–100.0)
MPV: 9.4 fL (ref 7.5–12.5)
Monocytes Relative: 7.4 %
Neutro Abs: 1896 cells/uL (ref 1500–7800)
Neutrophils Relative %: 30.1 %
Platelets: 298 10*3/uL (ref 140–400)
RBC: 4.55 10*6/uL (ref 3.80–5.10)
RDW: 13.4 % (ref 11.0–15.0)
Total Lymphocyte: 59 %
WBC: 6.3 10*3/uL (ref 3.8–10.8)

## 2021-01-10 LAB — COMPLETE METABOLIC PANEL WITH GFR
AG Ratio: 1.5 (calc) (ref 1.0–2.5)
ALT: 10 U/L (ref 6–29)
AST: 19 U/L (ref 10–35)
Albumin: 4.2 g/dL (ref 3.6–5.1)
Alkaline phosphatase (APISO): 88 U/L (ref 37–153)
BUN: 10 mg/dL (ref 7–25)
CO2: 31 mmol/L (ref 20–32)
Calcium: 9.4 mg/dL (ref 8.6–10.4)
Chloride: 102 mmol/L (ref 98–110)
Creat: 0.79 mg/dL (ref 0.50–0.99)
GFR, Est African American: 90 mL/min/{1.73_m2} (ref >=60)
GFR, Est Non African American: 77 mL/min/{1.73_m2} (ref >=60)
Globulin: 2.8 g/dL (calc) (ref 1.9–3.7)
Glucose, Bld: 84 mg/dL (ref 65–99)
Potassium: 4.8 mmol/L (ref 3.5–5.3)
Sodium: 140 mmol/L (ref 135–146)
Total Bilirubin: 0.3 mg/dL (ref 0.2–1.2)
Total Protein: 7 g/dL (ref 6.1–8.1)

## 2021-01-10 LAB — LIPID PANEL
Cholesterol: 122 mg/dL (ref <200)
HDL: 45 mg/dL — ABNORMAL LOW (ref >=50)
LDL Cholesterol (Calc): 57 mg/dL (calc)
Non-HDL Cholesterol (Calc): 77 mg/dL (calc) (ref <130)
Total CHOL/HDL Ratio: 2.7 (calc) (ref <5.0)
Triglycerides: 121 mg/dL (ref <150)

## 2021-01-10 LAB — URIC ACID: Uric Acid, Serum: 5.8 mg/dL (ref 2.5–7.0)

## 2021-01-10 LAB — VITAMIN D 25 HYDROXY (VIT D DEFICIENCY, FRACTURES): Vit D, 25-Hydroxy: 25 ng/mL — ABNORMAL LOW (ref 30–100)

## 2021-01-10 MED ORDER — VITAMIN D 25 MCG (1000 UNIT) PO TABS: 2000.0000 [IU] | ORAL_TABLET | Freq: Every day | ORAL | Status: DC

## 2021-01-28 ENCOUNTER — Other Ambulatory Visit: Payer: Self-pay | Admitting: Family Medicine

## 2021-01-28 DIAGNOSIS — I10 Essential (primary) hypertension: Secondary | ICD-10-CM

## 2021-01-28 DIAGNOSIS — E782 Mixed hyperlipidemia: Secondary | ICD-10-CM

## 2021-01-28 DIAGNOSIS — I2510 Atherosclerotic heart disease of native coronary artery without angina pectoris: Secondary | ICD-10-CM

## 2021-01-28 DIAGNOSIS — I739 Peripheral vascular disease, unspecified: Secondary | ICD-10-CM

## 2021-02-05 ENCOUNTER — Telehealth: Payer: Self-pay

## 2021-02-05 NOTE — Progress Notes (Signed)
    Chronic Care Management Pharmacy Assistant   Name: DELMAR DONDERO  MRN: [id number] DOB: 06-05-1951  Reason for Encounter: Medication Review/General Adherence Call.   Recent office visits:  01/09/2021 Delsa Grana PA-C   Recent consult visits:  No recent [facility] visits:  None in previous 6 months  Medications: Outpatient Encounter Medications as of 02/05/2021  Medication Sig   acetaminophen (TYLENOL) 500 MG tablet Take 500 mg by mouth 2 (two) times daily as needed for moderate pain or headache.   amLODipine (NORVASC) 5 MG tablet Take 1 tablet (5 mg total) by mouth daily. (Patient taking differently: Take 5 mg by mouth in the morning.)   atorvastatin (LIPITOR) 10 MG tablet Take 1 tablet (10 mg total) by mouth at bedtime.   cholecalciferol (VITAMIN D3) 25 MCG (1000 UNIT) tablet Take 2 tablets (2,000 Units total) by mouth daily.   potassium chloride (KLOR-CON 10) 10 MEQ tablet Take 1 tablet (10 mEq total) by mouth 2 (two) times daily.   vitamin E 400 UNIT capsule Take 180 Units by mouth daily.    warfarin (COUMADIN) 5 MG tablet Take 5 mg tablet Su, Tues, Wed, Th and Sa and take 1/2 tablet (2.5 mg) Mon, Fri   No facility-administered encounter medications on file as of 02/05/2021.    Star Rating Drugs: Atorvastatin 10 mg last filled on 11/26/2020 for 90 day supply.  Called patient and discussed medication adherence  with patient, no issues at this time with current medication.   Patient denies ED visit since her last CPP follow up.  Patient denies any side effects with her medication. Patient denies any problems with her current pharmacy  Adrianna Dudas [place] Pharmacist Assistant [phone]

## 2021-02-18 ENCOUNTER — Encounter: Payer: Self-pay | Admitting: Unknown Physician Specialty

## 2021-02-18 ENCOUNTER — Other Ambulatory Visit: Payer: Self-pay

## 2021-02-18 ENCOUNTER — Ambulatory Visit (INDEPENDENT_AMBULATORY_CARE_PROVIDER_SITE_OTHER): Payer: Medicare HMO | Admitting: Unknown Physician Specialty

## 2021-02-18 VITALS — BP 130/68 | HR 93 | Temp 98.4°F | Resp 14 | Ht 66.0 in | Wt 122.5 lb

## 2021-02-18 DIAGNOSIS — I2510 Atherosclerotic heart disease of native coronary artery without angina pectoris: Secondary | ICD-10-CM | POA: Diagnosis not present

## 2021-02-18 DIAGNOSIS — E782 Mixed hyperlipidemia: Secondary | ICD-10-CM

## 2021-02-18 DIAGNOSIS — I10 Essential (primary) hypertension: Secondary | ICD-10-CM

## 2021-02-18 DIAGNOSIS — I739 Peripheral vascular disease, unspecified: Secondary | ICD-10-CM

## 2021-02-18 DIAGNOSIS — Z7901 Long term (current) use of anticoagulants: Secondary | ICD-10-CM

## 2021-02-18 LAB — POCT INR: INR: 2.5 (ref 2.0–3.0)

## 2021-02-18 MED ORDER — ATORVASTATIN CALCIUM 10 MG PO TABS: 10.0000 mg | ORAL_TABLET | Freq: Every day | ORAL | 3 refills | Status: DC

## 2021-02-18 NOTE — Progress Notes (Signed)
BP 130/68   Pulse 93   Temp 98.4 F (36.9 C) (Oral)   Resp 14   Ht 5\' 6"  (1.676 m)   Wt 122 lb 8 oz (55.6 kg)   SpO2 99%   BMI 19.77 kg/m    Subjective:    Patient ID: Mary [name], female    DOB: November 24, 1950, 70 y.o.   MRN: [id number]  HPI: Mary [name] is a 70 y.o. female  Chief Complaint  Patient presents with   Follow-up    PT-INR   F/U of PT/INR.  Pt taking 5 mg 3 days/week and 2.5 mg 2 days/week. INR goal is 2-3.  Taking it to prevent clots status post vein surgery.  Denies blood in urine, stool, or mucous.  Denies nose bleeds  Needs Atorvastatin refilled.  Lipid panel done on 5/12 showing LDL <70  Relevant past medical, surgical, family and social history reviewed and updated as indicated. Interim medical history since our last visit reviewed. Allergies and medications reviewed and updated.  Review of Systems  Per HPI unless specifically indicated above     Objective:    BP 130/68   Pulse 93   Temp 98.4 F (36.9 C) (Oral)   Resp 14   Ht 5\' 6"  (1.676 m)   Wt 122 lb 8 oz (55.6 kg)   SpO2 99%   BMI 19.77 kg/m   Wt Readings from Last 3 Encounters:  02/18/21 122 lb 8 oz (55.6 kg)  01/09/21 124 lb 14.4 oz (56.7 kg)  10/09/20 126 lb 3.2 oz (57.2 kg)    Physical Exam Constitutional:      General: She is not in acute distress.    Appearance: Normal appearance. She is well-developed.  HENT:     Head: Normocephalic and atraumatic.  Eyes:     General: Lids are normal. No scleral icterus.       Right eye: No discharge.        Left eye: No discharge.     Conjunctiva/sclera: Conjunctivae normal.  Cardiovascular:     Rate and Rhythm: Normal rate.  Pulmonary:     Effort: Pulmonary effort is normal.  Abdominal:     Palpations: There is no hepatomegaly or splenomegaly.  Musculoskeletal:        General: Normal range of motion.  Skin:    Coloration: Skin is not pale.     Findings: No rash.  Neurological:     Mental Status: She is alert and oriented to  person, place, and time.  Psychiatric:        Behavior: Behavior normal.        Thought Content: Thought content normal.        Judgment: Judgment normal.    Results for orders placed or performed in visit on 02/18/21  POCT INR  Result Value Ref Range   INR 2.5 2.0 - 3.0      Assessment & Plan:   Problem List Items Addressed This Visit       Unprioritized   PAD (peripheral artery disease) ([facility]CC) (Chronic)   Relevant Medications   atorvastatin (LIPITOR) 10 MG tablet   Anticoagulated on Coumadin - Primary    INR 2.5 with goal of 2-5.  Continue present treatment       Relevant Orders   POCT INR (Completed)   Coronary artery disease   Relevant Medications   atorvastatin (LIPITOR) 10 MG tablet   Mixed hyperlipidemia   Relevant Medications   atorvastatin (LIPITOR) 10 MG  tablet   Primary hypertension    Stable, continue present medications.         Relevant Medications   atorvastatin (LIPITOR) 10 MG tablet     Follow up plan: Return in about 6 weeks (around 04/01/2021).

## 2021-02-18 NOTE — Assessment & Plan Note (Signed)
INR 2.5 with goal of 2-5.  Continue present treatment

## 2021-02-18 NOTE — Assessment & Plan Note (Signed)
Stable, continue present medications.   

## 2021-02-20 ENCOUNTER — Ambulatory Visit (INDEPENDENT_AMBULATORY_CARE_PROVIDER_SITE_OTHER): Payer: Medicare HMO

## 2021-02-20 ENCOUNTER — Ambulatory Visit: Payer: Medicare HMO | Admitting: Family Medicine

## 2021-02-20 DIAGNOSIS — Z0000 Encounter for general adult medical examination without abnormal findings: Secondary | ICD-10-CM | POA: Diagnosis not present

## 2021-02-20 NOTE — Patient Instructions (Signed)
Mary [name] , Thank you for taking time to come for your Medicare Wellness Visit. I appreciate your ongoing commitment to your health goals. Please review the following plan we discussed and let me know if I can assist you in the future.   Screening recommendations/referrals: Colonoscopy: done 07/06/18. Due for repeat screening. Please call [phone] to scheduled an appointment Mammogram: done 12/27/20 Bone Density: due. Please call [phone] to schedule your bone density screening.  Recommended yearly ophthalmology/optometry visit for glaucoma screening and checkup Recommended yearly dental visit for hygiene and checkup  Vaccinations: Influenza vaccine: declined Pneumococcal vaccine: declined Tdap vaccine: done 09/27/17 Shingles vaccine: Shingrix discussed. Please contact your pharmacy for coverage information.  Covid-19: done 10/28/19 & 11/25/19  Advanced directives: Advance directive discussed with you today. Even though you declined this today please call our office should you change your mind and we can give you the proper paperwork for you to fill out.   Conditions/risks identified: If you wish to quit smoking, help is available. For free tobacco cessation program offerings call the [facility] at (845)090-0178 or Live Well Line at 440 566 7137. You may also visit [email or web address] or email [email or web address] for more information on other programs.   Next appointment: Follow up in one year for your annual wellness visit    Preventive Care 65 Years and Older, Female Preventive care refers to lifestyle choices and visits with your health care provider that can promote health and wellness. What does preventive care include? A yearly physical exam. This is also called an annual well check. Dental exams once or twice a year. Routine eye exams. Ask your health care provider how often you should have your eyes checked. Personal lifestyle choices,  including: Daily care of your teeth and gums. Regular physical activity. Eating a healthy diet. Avoiding tobacco and drug use. Limiting alcohol use. Practicing safe sex. Taking low-dose aspirin every day. Taking vitamin and mineral supplements as recommended by your health care provider. What happens during an annual well check? The services and screenings done by your health care provider during your annual well check will depend on your age, overall health, lifestyle risk factors, and family history of disease. Counseling  Your health care provider may ask you questions about your: Alcohol use. Tobacco use. Drug use. Emotional well-being. Home and relationship well-being. Sexual activity. Eating habits. History of falls. Memory and ability to understand (cognition). Work and work Statistician. Reproductive health. Screening  You may have the following tests or measurements: Height, weight, and BMI. Blood pressure. Lipid and cholesterol levels. These may be checked every 5 years, or more frequently if you are over 8 years old. Skin check. Lung cancer screening. You may have this screening every year starting at age 62 if you have a 30-pack-year history of smoking and currently smoke or have quit within the past 15 years. Fecal occult blood test (FOBT) of the stool. You may have this test every year starting at age 35. Flexible sigmoidoscopy or colonoscopy. You may have a sigmoidoscopy every 5 years or a colonoscopy every 10 years starting at age 21. Hepatitis C blood test. Hepatitis B blood test. Sexually transmitted disease (STD) testing. Diabetes screening. This is done by checking your blood sugar (glucose) after you have not eaten for a while (fasting). You may have this done every 1-3 years. Bone density scan. This is done to screen for osteoporosis. You may have this done starting at age 27. Mammogram. This may be done every 1-2 years.  Talk to your health care provider  about how often you should have regular mammograms. Talk with your health care provider about your test results, treatment options, and if necessary, the need for more tests. Vaccines  Your health care provider may recommend certain vaccines, such as: Influenza vaccine. This is recommended every year. Tetanus, diphtheria, and acellular pertussis (Tdap, Td) vaccine. You may need a Td booster every 10 years. Zoster vaccine. You may need this after age 63. Pneumococcal 13-valent conjugate (PCV13) vaccine. One dose is recommended after age 3. Pneumococcal polysaccharide (PPSV23) vaccine. One dose is recommended after age 72. Talk to your health care provider about which screenings and vaccines you need and how often you need them. This information is not intended to replace advice given to you by your health care provider. Make sure you discuss any questions you have with your health care provider. Document Released: 09/13/2015 Document Revised: 05/06/2016 Document Reviewed: 06/18/2015 Elsevier Interactive Patient Education  2017 [employer] Prevention in the Home Falls can cause injuries. They can happen to people of all ages. There are many things you can do to make your home safe and to help prevent falls. What can I do on the outside of my home? Regularly fix the edges of walkways and driveways and fix any cracks. Remove anything that might make you trip as you walk through a door, such as a raised step or threshold. Trim any bushes or trees on the path to your home. Use bright outdoor lighting. Clear any walking paths of anything that might make someone trip, such as rocks or tools. Regularly check to see if handrails are loose or broken. Make sure that both sides of any steps have handrails. Any raised decks and porches should have guardrails on the edges. Have any leaves, snow, or ice cleared regularly. Use sand or salt on walking paths during winter. Clean up any spills in  your garage right away. This includes oil or grease spills. What can I do in the bathroom? Use night lights. Install grab bars by the toilet and in the tub and shower. Do not use towel bars as grab bars. Use non-skid mats or decals in the tub or shower. If you need to sit down in the shower, use a plastic, non-slip stool. Keep the floor dry. Clean up any water that spills on the floor as soon as it happens. Remove soap buildup in the tub or shower regularly. Attach bath mats securely with double-sided non-slip rug tape. Do not have throw rugs and other things on the floor that can make you trip. What can I do in the bedroom? Use night lights. Make sure that you have a light by your bed that is easy to reach. Do not use any sheets or blankets that are too big for your bed. They should not hang down onto the floor. Have a firm chair that has side arms. You can use this for support while you get dressed. Do not have throw rugs and other things on the floor that can make you trip. What can I do in the kitchen? Clean up any spills right away. Avoid walking on wet floors. Keep items that you use a lot in easy-to-reach places. If you need to reach something above you, use a strong step stool that has a grab bar. Keep electrical cords out of the way. Do not use floor polish or wax that makes floors slippery. If you must use wax, use non-skid floor wax.  Do not have throw rugs and other things on the floor that can make you trip. What can I do with my stairs? Do not leave any items on the stairs. Make sure that there are handrails on both sides of the stairs and use them. Fix handrails that are broken or loose. Make sure that handrails are as long as the stairways. Check any carpeting to make sure that it is firmly attached to the stairs. Fix any carpet that is loose or worn. Avoid having throw rugs at the top or bottom of the stairs. If you do have throw rugs, attach them to the floor with carpet  tape. Make sure that you have a light switch at the top of the stairs and the bottom of the stairs. If you do not have them, ask someone to add them for you. What else can I do to help prevent falls? Wear shoes that: Do not have high heels. Have rubber bottoms. Are comfortable and fit you well. Are closed at the toe. Do not wear sandals. If you use a stepladder: Make sure that it is fully opened. Do not climb a closed stepladder. Make sure that both sides of the stepladder are locked into place. Ask someone to hold it for you, if possible. Clearly mark and make sure that you can see: Any grab bars or handrails. First and last steps. Where the edge of each step is. Use tools that help you move around (mobility aids) if they are needed. These include: Canes. Walkers. Scooters. Crutches. Turn on the lights when you go into a dark area. Replace any light bulbs as soon as they burn out. Set up your furniture so you have a clear path. Avoid moving your furniture around. If any of your floors are uneven, fix them. If there are any pets around you, be aware of where they are. Review your medicines with your doctor. Some medicines can make you feel dizzy. This can increase your chance of falling. Ask your doctor what other things that you can do to help prevent falls. This information is not intended to replace advice given to you by your health care provider. Make sure you discuss any questions you have with your health care provider. Document Released: 06/13/2009 Document Revised: 01/23/2016 Document Reviewed: 09/21/2014 Elsevier Interactive Patient Education  2017 [employer].

## 2021-02-20 NOTE — Progress Notes (Signed)
Subjective:   Mary [name] is a 70 y.o. female who presents for Medicare Annual (Subsequent) preventive examination.  Virtual Visit via Telephone Note  I connected with  Mary [name] on 02/20/21 at 10:00 AM EDT by telephone and verified that I am speaking with the correct person using two identifiers.  Location: Patient: home Provider: [place] Persons participating in the virtual visit: [place]   I discussed the limitations, risks, security and privacy concerns of performing an evaluation and management service by telephone and the availability of in person appointments. The patient expressed understanding and agreed to proceed.  Interactive audio and video telecommunications were attempted between this nurse and patient, however failed, due to patient having technical difficulties OR patient did not have access to video capability.  We continued and completed visit with audio only.  Some vital signs may be absent or patient reported.   Mary Marker, LPN   Review of Systems     Cardiac Risk Factors include: advanced age (>37men, >47 women);dyslipidemia;hypertension;smoking/ tobacco exposure     Objective:    There were no vitals filed for this visit. There is no height or weight on file to calculate BMI.  Advanced Directives 02/20/2021 02/20/2020 02/16/2019 07/06/2018 02/10/2018 01/14/2018 01/13/2018  Does Patient Have a Medical Advance Directive? No No No No No No No  Would patient like information on creating a medical advance directive? No - Patient declined Yes (MAU/Ambulatory/Procedural Areas - Information given) No - Patient declined No - Patient declined Yes (MAU/Ambulatory/Procedural Areas - Information given) No - Patient declined -    Current Medications (verified) Outpatient Encounter Medications as of 02/20/2021  Medication Sig   acetaminophen (TYLENOL) 500 MG tablet Take 500 mg by mouth 2 (two) times daily as needed for moderate pain or headache.    amLODipine (NORVASC) 5 MG tablet Take 1 tablet (5 mg total) by mouth daily. (Patient taking differently: Take 5 mg by mouth in the morning.)   atorvastatin (LIPITOR) 10 MG tablet Take 1 tablet (10 mg total) by mouth at bedtime.   cholecalciferol (VITAMIN D3) 25 MCG (1000 UNIT) tablet Take 2 tablets (2,000 Units total) by mouth daily.   potassium chloride (KLOR-CON 10) 10 MEQ tablet Take 1 tablet (10 mEq total) by mouth 2 (two) times daily.   vitamin E 400 UNIT capsule Take 180 Units by mouth daily.    warfarin (COUMADIN) 5 MG tablet Take 5 mg tablet Su, Tues, Wed, Th and Sa and take 1/2 tablet (2.5 mg) Mon, Fri   No facility-administered encounter medications on file as of 02/20/2021.    Allergies (verified) Patient has no known allergies.   History: Past Medical History:  Diagnosis Date   Arthritis    Hyperlipidemia    Hypertension    Hyperuricemia    Osteoporosis    PAD (peripheral artery disease) ([place]) 02/2010   angiplasty, popliteal artery   Tobacco abuse    Past Surgical History:  Procedure Laterality Date   ANGIOPLASTY  July 2011   PAD- popliteal artery   COLONOSCOPY WITH PROPOFOL N/A 01/13/2018   Procedure: COLONOSCOPY WITH PROPOFOL;  Surgeon: Lin Landsman, MD;  Location: [facility];  Service: Gastroenterology;  Laterality: N/A;   COLONOSCOPY WITH PROPOFOL N/A 01/14/2018   Procedure: COLONOSCOPY WITH PROPOFOL;  Surgeon: Virgel Manifold, MD;  Location: [facility];  Service: Endoscopy;  Laterality: N/A;   COLONOSCOPY WITH PROPOFOL N/A 07/06/2018   Procedure: COLONOSCOPY WITH PROPOFOL;  Surgeon: Virgel Manifold, MD;  Location:  [facility];  Service: Endoscopy;  Laterality: N/A;   CORONARY ANGIOPLASTY     JOINT REPLACEMENT     Leg Bypass Surgery Right 08/2010   OVARIAN CYST SURGERY  1980s   POPLITEAL ARTERY STENT Right 05/2010   POPLITEAL ARTERY STENT Right 07/2010   TOTAL HIP ARTHROPLASTY Left 10/27/2017   Procedure: TOTAL HIP ARTHROPLASTY;  Surgeon:  Earnestine Leys, MD;  Location: [facility];  Service: Orthopedics;  Laterality: Left;   Family History  Problem Relation Age of Onset   Arthritis Mother    Diabetes Mother    Stroke Mother    Hypertension Mother    Dementia Mother    Cancer Father    Arthritis Sister    Seizures Sister    Diabetes Sister    Hypertension Sister    COPD Sister    Cancer Brother    Stroke Maternal Grandmother    Diabetes Paternal Grandmother    Cancer Sister    Diabetes Sister    Arthritis Sister    Diabetes Brother    Stroke Brother    Healthy Brother    Diabetes Brother    Stroke Brother    Cancer Brother    Heart disease Neg Hx    Breast cancer Neg Hx    Social History   Socioeconomic History   Marital status: Married    Spouse name: Joien   Number of children: 0   Years of education: Not on file   Highest education level: 12th grade  Occupational History   Occupation: Disabled  Tobacco Use   Smoking status: Every Day    Packs/day: 0.50    Years: 40.00    Pack years: 20.00    Types: Cigarettes   Smokeless tobacco: Never  Vaping Use   Vaping Use: Never used  Substance and Sexual Activity   Alcohol use: No   Drug use: No   Sexual activity: Yes  Other Topics Concern   Not on file  Social History Narrative   Not on file   Social Determinants of Health   Financial Resource Strain: Low Risk    Difficulty of Paying Living Expenses: Not hard at all  Food Insecurity: No Food Insecurity   Worried About Charity fundraiser in the Last Year: Never true   Ran Out of Food in the Last Year: Never true  Transportation Needs: No Transportation Needs   Lack of Transportation (Medical): No   Lack of Transportation (Non-Medical): No  Physical Activity: Insufficiently Active   Days of Exercise per Week: 7 days   Minutes of Exercise per Session: 20 min  Stress: No Stress Concern Present   Feeling of Stress : Not at all  Social Connections: Moderately Isolated   Frequency of  Communication with Friends and Family: More than three times a week   Frequency of Social Gatherings with Friends and Family: Once a week   Attends Religious Services: Never   Marine scientist or Organizations: No   Attends Music therapist: Never   Marital Status: Married    Tobacco Counseling Ready to quit: Yes Counseling given: Yes   Clinical Intake:  Pre-visit preparation completed: Yes  Pain : No/denies pain     Nutritional Risks: None Diabetes: No  How often do you need to have someone help you when you read instructions, pamphlets, or other written materials from your doctor or pharmacy?: 1 - Never    Interpreter Needed?: No  Information entered by :: [employer]  LPN   Activities of Daily Living In your present state of health, do you have any difficulty performing the following activities: 02/20/2021 02/18/2021  Hearing? N N  Comment declines hearing aids -  Vision? N N  Difficulty concentrating or making decisions? N N  Walking or climbing stairs? N N  Dressing or bathing? N N  Doing errands, shopping? N N  Preparing Food and eating ? N -  Using the Toilet? N -  In the past six months, have you accidently leaked urine? N -  Do you have problems with loss of bowel control? N -  Managing your Medications? N -  Managing your Finances? N -  Housekeeping or managing your Housekeeping? N -  Some recent data might be hidden    Patient Care Team: Delsa Grana, PA-C as PCP - General (Family Medicine) Lucky Cowboy, Erskine Squibb, MD as Referring Physician (Vascular Surgery) Germaine Pomfret, [facility] (Pharmacist) Virgel Manifold, MD as Consulting Physician (Gastroenterology)  Indicate any recent Medical Services you may have received from other than [facility] providers in the past year (date may be approximate).     Assessment:   This is a routine wellness examination for [place].  Hearing/Vision screen Hearing Screening - Comments:: Pt denies hearing  difficulty Vision Screening - Comments:: Past due for eye exam. Established with [facility] in [place] issues and exercise activities discussed: Current Exercise Habits: Home exercise routine, Type of exercise: walking, Time (Minutes): 20, Frequency (Times/Week): 7, Weekly Exercise (Minutes/Week): 140, Intensity: Mild, Exercise limited by: None identified   Goals Addressed             This Visit's Progress    Quit Smoking       If you wish to quit smoking, help is available. For free tobacco cessation program offerings call the [facility] at (865)797-5541 or Live Well Line at 743-421-1364. You may also visit [email or web address] or email [email or web address] for more information on other programs.          Depression Screen PHQ 2/9 Scores 02/20/2021 02/18/2021 01/09/2021 10/09/2020 08/28/2020 07/29/2020 06/28/2020  PHQ - 2 Score 0 0 0 0 0 0 0  PHQ- 9 Score - - 0 - - - -    Fall Risk Fall Risk  02/20/2021 02/18/2021 01/09/2021 10/09/2020 08/28/2020  Falls in the past year? 0 0 0 0 0  Number falls in past yr: 0 0 0 0 0  Injury with Fall? 0 0 0 0 0  Risk for fall due to : No Fall Risks - - - -  Follow up Falls prevention discussed Falls evaluation completed - Falls evaluation completed Falls evaluation completed    [place]:  Any stairs in or around the home? Yes  If so, are there any without handrails? Yes  - 2 steps outside Home free of loose throw rugs in walkways, pet beds, electrical cords, etc? Yes  Adequate lighting in your home to reduce risk of falls? Yes   ASSISTIVE DEVICES UTILIZED TO PREVENT FALLS:  Life alert? No  Use of a cane, walker or w/c? No  Grab bars in the bathroom? Yes  Shower chair or bench in shower? No  Elevated toilet seat or a handicapped toilet? No   TIMED UP AND GO:  Was the test performed? No .    Cognitive Function: Normal cognitive status assessed by direct observation by this  Nurse Health Advisor. No abnormalities found.  6CIT Screen 02/20/2020 02/16/2019 02/10/2018  What Year? 0 points 0 points 0 points  What month? 0 points 0 points 0 points  What time? 0 points 0 points 0 points  Count back from 20 0 points 0 points 0 points  Months in reverse 0 points 0 points 2 points  Repeat phrase 0 points 0 points 2 points  Total Score 0 0 4    Immunizations Immunization History  Administered Date(s) Administered   Moderna Sars-Covid-2 Vaccination 10/28/2019, 11/25/2019   Tdap 09/27/2017    TDAP status: Up to date  Flu Vaccine status: Declined, Education has been provided regarding the importance of this vaccine but patient still declined. Advised may receive this vaccine at local pharmacy or Health Dept. Aware to provide a copy of the vaccination record if obtained from local pharmacy or Health Dept. Verbalized acceptance and understanding.  Pneumococcal vaccine status: Declined,  Education has been provided regarding the importance of this vaccine but patient still declined. Advised may receive this vaccine at local pharmacy or Health Dept. Aware to provide a copy of the vaccination record if obtained from local pharmacy or Health Dept. Verbalized acceptance and understanding.   Covid-19 vaccine status: Completed vaccines  Qualifies for Shingles Vaccine? Yes   Zostavax completed No   Shingrix Completed?: No.    Education has been provided regarding the importance of this vaccine. Patient has been advised to call insurance company to determine out of pocket expense if they have not yet received this vaccine. Advised may also receive vaccine at local pharmacy or Health Dept. Verbalized acceptance and understanding.  Screening Tests Health Maintenance  Topic Date Due   DEXA SCAN  Never done   COVID-19 Vaccine (3 - Booster for Moderna series) 03/06/2021 (Originally 04/26/2020)   Zoster Vaccines- Shingrix (1 of 2) 05/21/2021 (Originally 04/08/2003)   COLONOSCOPY  (Pts 45-49yrs Insurance coverage will need to be confirmed)  08/28/2021 (Originally 07/06/2020)   INFLUENZA VACCINE  03/31/2021   MAMMOGRAM  12/27/2021   TETANUS/TDAP  09/28/2027   Hepatitis C Screening  Completed   HPV VACCINES  Aged Out   PNA vac Low Risk Adult  Discontinued    Health Maintenance  Health Maintenance Due  Topic Date Due   DEXA SCAN  Never done    Colorectal cancer screening: Type of screening: Colonoscopy. Completed 07/06/18. Repeat every 2 years. Pt aware to contact [facility] for repeat screening colonoscopy.   Mammogram status: Completed 12/27/20. Repeat every year  Bone Density status: Ordered 01/09/21. Pt provided with contact info and advised to call to schedule appt.  Lung Cancer Screening: (Low Dose CT Chest recommended if Age 64-80 years, 30 pack-year currently smoking OR have quit w/in 15years.) does not qualify.   Additional Screening:  Hepatitis C Screening: does qualify; Completed 03/29/19  Vision Screening: Recommended annual ophthalmology exams for early detection of glaucoma and other disorders of the eye. Is the patient up to date with their annual eye exam?  No  Who is the provider or what is the name of the office in which the patient attends annual eye exams? [facility] [place].   Dental Screening: Recommended annual dental exams for proper oral hygiene  Community Resource Referral / Chronic Care Management: CRR required this visit?  No   CCM required this visit?  No      Plan:     I have personally reviewed and noted the following in the patient's chart:   Medical and social history Use of alcohol, tobacco or  illicit drugs  Current medications and supplements including opioid prescriptions.  Functional ability and status Nutritional status Physical activity Advanced directives List of other physicians Hospitalizations, surgeries, and ER visits in previous 12 months Vitals Screenings to include cognitive, depression, and  falls Referrals and appointments  In addition, I have reviewed and discussed with patient certain preventive protocols, quality metrics, and best practice recommendations. A written personalized care plan for preventive services as well as general preventive health recommendations were provided to patient.     Mary Marker, LPN   11/27/1912   Nurse Notes: none

## 2021-03-04 ENCOUNTER — Telehealth: Payer: Self-pay

## 2021-03-04 NOTE — Progress Notes (Signed)
Spoke to patient to see if we could move her telephone appointment on 03/05/2021 for CCM at 11:00 am with Junius Argyle the Clinical pharmacist to 4:00 pm.  Patient states she is okay with moving her telephone appointment to 4:00 pm on 03/05/2021.Sent Message to scheduler.  Patient aware to have all medications, supplements, blood pressure logs to visit.  Questions: Have you had any recent office visit or specialist visit outside of [place]?No Are there any concerns you would like to discuss during your office visit?No Are you having any problems obtaining your medications? No   Star Rating Drug: Atorvastatin 10 mg last filled on 11/26/2020 for 90 day supply.  Any gaps in medications fill history? None ID    Anderson [place] Clinical Pharmacist Assistant [phone]

## 2021-03-05 ENCOUNTER — Ambulatory Visit (INDEPENDENT_AMBULATORY_CARE_PROVIDER_SITE_OTHER): Payer: Medicare HMO

## 2021-03-05 DIAGNOSIS — I739 Peripheral vascular disease, unspecified: Secondary | ICD-10-CM

## 2021-03-05 DIAGNOSIS — Z720 Tobacco use: Secondary | ICD-10-CM

## 2021-03-05 DIAGNOSIS — I2510 Atherosclerotic heart disease of native coronary artery without angina pectoris: Secondary | ICD-10-CM

## 2021-03-05 DIAGNOSIS — I10 Essential (primary) hypertension: Secondary | ICD-10-CM | POA: Diagnosis not present

## 2021-03-05 DIAGNOSIS — E782 Mixed hyperlipidemia: Secondary | ICD-10-CM | POA: Diagnosis not present

## 2021-03-05 NOTE — Progress Notes (Signed)
Chronic Care Management Pharmacy Note  03/06/2021 Name:  Mary [name] MRN:  [id number] DOB:  27-May-1951  Summary: Patient feels she is in good health today. She monitors her blood pressure at home and reports her readings have been normal. She continues to smoke 1/2 ppd and is not interested in smoking cessation therapy.  Recommendations/Changes made from today's visit: Continue Current Medications  Plan: CPP follow-up in 6 months    Subjective: Mary [name] is an 70 y.o. year old female who is a primary patient of Delsa Grana, [place].  The CCM team was consulted for assistance with disease management and care coordination needs.    Engaged with patient by telephone for follow up visit in response to provider referral for pharmacy case management and/or care coordination services.   Consent to Services:  The patient was given information about Chronic Care Management services, agreed to services, and gave verbal consent prior to initiation of services.  Please see initial visit note for detailed documentation.   Patient Care Team: Delsa Grana, PA-C as PCP - General (Family Medicine) Lucky Cowboy Erskine Squibb, MD as Referring Physician (Vascular Surgery) Germaine Pomfret, [facility] (Pharmacist) Virgel Manifold, MD as Consulting Physician (Gastroenterology)  Recent office visits: 02/20/21: Patient presented to Clemetine Marker, LPN for AWV.  6/38/75: Patient presented to Kathrine Haddock, NP for follow-up. No medication changes noted.  01/09/21: Patient presented to Delsa Grana, PA-C for follow-up. Increase Vitamin D to 2000 units daily   Recent consult visits: None in previous 6 months  Hospital visits: None in previous 6 months   Objective:  Lab Results  Component Value Date   CREATININE 0.79 01/09/2021   BUN 10 01/09/2021   GFRNONAA 77 01/09/2021   GFRAA 90 01/09/2021   NA 140 01/09/2021   K 4.8 01/09/2021   CALCIUM 9.4 01/09/2021   CO2 31 01/09/2021   GLUCOSE 84 01/09/2021     Lab Results  Component Value Date/Time   HGBA1C 6.2 (H) 10/13/2017 01:32 PM    Last diabetic Eye exam: No results found for: HMDIABEYEEXA  Last diabetic Foot exam: No results found for: HMDIABFOOTEX   Lab Results  Component Value Date   CHOL 122 01/09/2021   HDL 45 (L) 01/09/2021   LDLCALC 57 01/09/2021   TRIG 121 01/09/2021   CHOLHDL 2.7 01/09/2021    Hepatic Function Latest Ref Rng & Units 01/09/2021 07/29/2020 02/05/2020  Total Protein 6.1 - 8.1 g/dL 7.0 7.3 7.2  Albumin 3.5 - 5.0 g/dL - - -  AST 10 - 35 U/L _0 ALT 6 - 29 U/L _1 Alk Phosphatase 38 - 126 U/L - - -  Total Bilirubin 0.2 - 1.2 mg/dL 0.3 0.3 0.4    Lab Results  Component Value Date/Time   TSH 2.13 02/22/2018 09:03 AM   FREET4 1.0 02/22/2018 09:03 AM    CBC Latest Ref Rng & Units 01/09/2021 07/29/2020 02/05/2020  WBC 3.8 - 10.8 Thousand/uL 6.3 6.2 7.6  Hemoglobin 11.7 - 15.5 g/dL 13.5 14.0 13.9  Hematocrit 35.0 - 45.0 % 39.9 40.4 41.7  Platelets 140 - 400 Thousand/uL 298 284 313    Lab Results  Component Value Date/Time   VD25OH 25 (L) 01/09/2021 11:33 AM    Clinical ASCVD: No  The ASCVD Risk score ([place]., et al., 2013) failed to calculate for the following reasons:   The valid total cholesterol range is 130 to 320 mg/dL    Depression screen PHQ  2/9 02/20/2021 02/18/2021 01/09/2021  Decreased Interest 0 0 0  Down, Depressed, Hopeless 0 0 0  PHQ - 2 Score 0 0 0  Altered sleeping - - 0  Tired, decreased energy - - 0  Change in appetite - - 0  Feeling bad or failure about yourself  - - 0  Trouble concentrating - - 0  Moving slowly or fidgety/restless - - 0  Suicidal thoughts - - 0  PHQ-9 Score - - 0  Difficult doing work/chores - - Not difficult at all  Some recent data might be hidden    Social History   Tobacco Use  Smoking Status Every Day   Packs/day: 0.50   Years: 40.00   Pack years: 20.00   Types: Cigarettes  Smokeless Tobacco Never   BP Readings from Last 3  Encounters:  02/18/21 130/68  01/09/21 112/72  10/09/20 120/70   Pulse Readings from Last 3 Encounters:  02/18/21 93  01/09/21 97  10/09/20 98   Wt Readings from Last 3 Encounters:  02/18/21 122 lb 8 oz (55.6 kg)  01/09/21 124 lb 14.4 oz (56.7 kg)  10/09/20 126 lb 3.2 oz (57.2 kg)   BMI Readings from Last 3 Encounters:  02/18/21 19.77 kg/m  01/09/21 20.16 kg/m  10/09/20 20.37 kg/m    Assessment/Interventions: Review of patient past medical history, allergies, medications, health status, including review of consultants reports, laboratory and other test data, was performed as part of comprehensive evaluation and provision of chronic care management services.   SDOH:  (Social Determinants of Health) assessments and interventions performed: Yes  SDOH Screenings   Alcohol Screen: Low Risk    Last Alcohol Screening Score (AUDIT): 0  Depression (PHQ2-9): Low Risk    PHQ-2 Score: 0  Financial Resource Strain: Low Risk    Difficulty of Paying Living Expenses: Not hard at all  Food Insecurity: No Food Insecurity   Worried About Charity fundraiser in the Last Year: Never true   Ran Out of Food in the Last Year: Never true  Housing: Low Risk    Last Housing Risk Score: 0  Physical Activity: Insufficiently Active   Days of Exercise per Week: 7 days   Minutes of Exercise per Session: 20 min  Social Connections: Moderately Isolated   Frequency of Communication with Friends and Family: More than three times a week   Frequency of Social Gatherings with Friends and Family: Once a week   Attends Religious Services: Never   Marine scientist or Organizations: No   Attends Music therapist: Never   Marital Status: Married  Stress: No Stress Concern Present   Feeling of Stress : Not at all  Tobacco Use: High Risk   Smoking Tobacco Use: Every Day   Smokeless Tobacco Use: Never  Transportation Needs: No Transportation Needs   Lack of Transportation (Medical): No    Lack of Transportation (Non-Medical): No    CCM Care Plan  No Known Allergies  Medications Reviewed Today     Reviewed by Clemetine Marker, LPN (Licensed Practical Nurse) on 02/20/21 at 48  Med List Status: <None>   Medication Order Taking? Sig Documenting Provider Last Dose Status Informant  acetaminophen (TYLENOL) 500 MG tablet [id number] Yes Take 500 mg by mouth 2 (two) times daily as needed for moderate pain or headache. [provider] Taking Active Self  amLODipine (NORVASC) 5 MG tablet [id number] Yes Take 1 tablet (5 mg total) by mouth daily.  Patient taking differently: Take  5 mg by mouth in the morning.   Delsa Grana, PA-C Taking Active   atorvastatin (LIPITOR) 10 MG tablet [id number] Yes Take 1 tablet (10 mg total) by mouth at bedtime. Kathrine Haddock, NP Taking Active   cholecalciferol (VITAMIN D3) 25 MCG (1000 UNIT) tablet [id number] Yes Take 2 tablets (2,000 Units total) by mouth daily. Delsa Grana, PA-C Taking Active   potassium chloride (KLOR-CON 10) 10 MEQ tablet [id number] Yes Take 1 tablet (10 mEq total) by mouth 2 (two) times daily. Arnetha Courser, MD Taking Active   vitamin E 400 UNIT capsule [id number] Yes Take 180 Units by mouth daily.  [provider] Taking Active Self           Med Note Clemetine Marker D   Tue Feb 20, 2020 10:19 AM)    warfarin (COUMADIN) 5 MG tablet [id number] Yes Take 5 mg tablet Su, Tues, Wed, Th and Sa and take 1/2 tablet (2.5 mg) Mon, Fri Delsa Grana, PA-C Taking Active             Patient Active Problem List   Diagnosis Date Noted   Anticoagulated on Coumadin 02/05/2020   History of gout 06/23/2019   Coronary artery disease 02/16/2019   Benign neoplasm of cecum    Benign neoplasm of transverse colon    Polyp of sigmoid colon    External hemorrhoids    Hip arthritis 10/27/2017   Breast lump on left side at 8 o'clock position 07/08/2017   Breast lump on right side at 7 o'clock position 11/14/2015   Encounter for  monitoring Coumadin therapy 02/27/2015   PAD (peripheral artery disease) ([facility])    Primary hypertension    Mixed hyperlipidemia    Tobacco abuse    Hyperuricemia     Immunization History  Administered Date(s) Administered   Moderna Sars-Covid-2 Vaccination 10/28/2019, 11/25/2019   Tdap 09/27/2017    Conditions to be addressed/monitored:  Hypertension, Hyperlipidemia, Coronary Artery Disease, Tobacco use, and Peripheral Artery Disease  Care Plan : General Pharmacy (Adult)  Updates made by Germaine Pomfret, RPH since 03/06/2021 12:00 AM     Problem: Hypertension, Hyperlipidemia, Coronary Artery Disease, Tobacco use, and Peripheral Artery Disease   Priority: High     Long-Range Goal: Patient-Specific Goal   Start Date: 03/06/2021  Expected End Date: 09/06/2021  This Visit's Progress: On track  Priority: High  Note:   Current Barriers:  Unable to quit smoking  Pharmacist Clinical Goal(s):  Patient will maintain control of blood pressure as evidenced by BP less than 140/90  reduce and quit tobacco use through collaboration with PharmD and provider.   Interventions: 1:1 collaboration with Delsa Grana, PA-C regarding development and update of comprehensive plan of care as evidenced by provider attestation and co-signature Inter-disciplinary care team collaboration (see longitudinal plan of care) Comprehensive medication review performed; medication list updated in electronic medical record  Hypertension (BP goal <140/90) -Controlled -Current treatment: Amlodipine 5 mg daily  -Medications previously tried: NA   -Current home readings: 128/78, 130/80  -Current dietary habits: Does salt foods,  -Current exercise habits: Walks daily -Denies hypotensive/hypertensive symptoms -Recommended to continue current medication  Hyperlipidemia: (LDL goal < 70) -Controlled -History of CAD, PAD -Current treatment: Atorvastatin 10 mg daily  -Medications previously tried: NA  -Educated  on Importance of limiting foods high in cholesterol; -Counseled on importance of smoking cessation for protecting against occlusions, worsening PAD -Recommended to continue current medication  Tobacco use (Goal Quit smoking) -Uncontrolled -Previous quit attempts: None -  Current treatment  1/2 ppd  -Patient smokes Within 30 minutes of waking -Patient triggers include: NA -On a scale of 1-10, reports MOTIVATION to quit is 1 -On a scale of 1-10, reports CONFIDENCE in quitting is 1 -Patient in pre-contemplative stage of quitting. Counseled patient on importance of cutting back and eventually quitting, but patient wants to "work on it on her own."  -Provided contact information for [employer] (1-800-QUIT-NOW) and encouraged patient to reach out to this group for support.  Patient Goals/Self-Care Activities Patient will:  - check blood pressure weekly, document, and provide at future appointments  Follow Up Plan: Telephone follow up appointment with care management team member scheduled for:  09/03/2021 at 2:00 PM      Medication Assistance: None required.  Patient affirms current coverage meets needs.  Compliance/Adherence/Medication fill history: Care Gaps: DEXA Scan  Star-Rating Drugs: Atorvastatin 10 mg daily: LF 11/26/20 for 90DS   Patient's preferred pharmacy is:  [facility] [address] [place] [address] Phone: [phone] Fax: [phone]  [facility] - [place], [place] [address] Phone: [phone] Fax: [phone]  Uses pill box? Yes Pt endorses 90% compliance  We discussed: Current pharmacy is preferred with insurance plan and patient is satisfied with pharmacy services Patient decided to: Continue current medication management strategy  Care Plan and Follow Up Patient Decision:  Patient agrees to Care  Plan and Follow-up.  Plan: Telephone follow up appointment with care management team member scheduled for:  09/03/2021 at 2:00 PM  Doristine Section, [facility] [phone]

## 2021-03-06 NOTE — Patient Instructions (Signed)
Visit Information It was great speaking with you today!  Please let me know if you have any questions about our visit.   Goals Addressed             This Visit's Progress    Quit Smoking   Not on track    If you wish to quit smoking, help is available. For free tobacco cessation program offerings call the [facility] at (530)661-1024 or Live Well Line at 218-264-9836. You may also visit [email or web address] or email [email or web address] for more information on other programs.        Track and Manage My Blood Pressure-Hypertension       Timeframe:  Long-Range Goal Priority:  High Start Date: 03/06/2021                             Expected End Date: 03/06/2022                       Follow Up Date 05/30/2021    - check blood pressure weekly    Why is this important?   You won't feel high blood pressure, but it can still hurt your blood vessels.  High blood pressure can cause heart or kidney problems. It can also cause a stroke.  Making lifestyle changes like losing a little weight or eating less salt will help.  Checking your blood pressure at home and at different times of the day can help to control blood pressure.  If the doctor prescribes medicine remember to take it the way the doctor ordered.  Call the office if you cannot afford the medicine or if there are questions about it.     Notes:          Patient Care Plan: General Pharmacy (Adult)     Problem Identified: Hypertension, Hyperlipidemia, Coronary Artery Disease, Tobacco use, and Peripheral Artery Disease   Priority: High     Long-Range Goal: Patient-Specific Goal   Start Date: 03/06/2021  Expected End Date: 09/06/2021  This Visit's Progress: On track  Priority: High  Note:   Current Barriers:  Unable to quit smoking  Pharmacist Clinical Goal(s):  Patient will maintain control of blood pressure as evidenced by BP less than 140/90  reduce and quit tobacco use through collaboration with  PharmD and provider.   Interventions: 1:1 collaboration with Delsa Grana, PA-C regarding development and update of comprehensive plan of care as evidenced by provider attestation and co-signature Inter-disciplinary care team collaboration (see longitudinal plan of care) Comprehensive medication review performed; medication list updated in electronic medical record  Hypertension (BP goal <140/90) -Controlled -Current treatment: Amlodipine 5 mg daily  -Medications previously tried: NA   -Current home readings: 128/78, 130/80  -Current dietary habits: Does salt foods,  -Current exercise habits: Walks daily -Denies hypotensive/hypertensive symptoms -Recommended to continue current medication  Hyperlipidemia: (LDL goal < 70) -Controlled -History of CAD, PAD -Current treatment: Atorvastatin 10 mg daily  -Medications previously tried: NA  -Educated on Importance of limiting foods high in cholesterol; -Counseled on importance of smoking cessation for protecting against occlusions, worsening PAD -Recommended to continue current medication  Tobacco use (Goal Quit smoking) -Uncontrolled -Previous quit attempts: None -Current treatment  1/2 ppd  -Patient smokes Within 30 minutes of waking -Patient triggers include: NA -On a scale of 1-10, reports MOTIVATION to quit is 1 -On a scale of 1-10, reports CONFIDENCE in quitting is 1 -  Patient in pre-contemplative stage of quitting. Counseled patient on importance of cutting back and eventually quitting, but patient wants to "work on it on her own."  -Provided contact information for [employer] (1-800-QUIT-NOW) and encouraged patient to reach out to this group for support.  Patient Goals/Self-Care Activities Patient will:  - check blood pressure weekly, document, and provide at future appointments  Follow Up Plan: Telephone follow up appointment with care management team member scheduled for:  09/03/2021 at 2:00 PM      Patient agreed to  services and verbal consent obtained.   The patient verbalized understanding of instructions, educational materials, and care plan provided today and declined offer to receive copy of patient instructions, educational materials, and care plan.   Doristine Section, [facility] [phone]

## 2021-04-01 ENCOUNTER — Ambulatory Visit (INDEPENDENT_AMBULATORY_CARE_PROVIDER_SITE_OTHER): Payer: Medicare HMO | Admitting: Unknown Physician Specialty

## 2021-04-01 ENCOUNTER — Other Ambulatory Visit: Payer: Self-pay

## 2021-04-01 ENCOUNTER — Encounter: Payer: Self-pay | Admitting: Unknown Physician Specialty

## 2021-04-01 VITALS — BP 122/70 | HR 97 | Temp 98.3°F | Resp 14 | Ht 66.0 in | Wt 123.0 lb

## 2021-04-01 DIAGNOSIS — I739 Peripheral vascular disease, unspecified: Secondary | ICD-10-CM | POA: Diagnosis not present

## 2021-04-01 DIAGNOSIS — Z7901 Long term (current) use of anticoagulants: Secondary | ICD-10-CM | POA: Diagnosis not present

## 2021-04-01 LAB — POCT INR: INR: 2.4 (ref 2.0–3.0)

## 2021-04-01 MED ORDER — WARFARIN SODIUM 5 MG PO TABS: ORAL_TABLET | ORAL | 1 refills | Status: DC

## 2021-04-01 NOTE — Assessment & Plan Note (Signed)
At goal today.  Continue present dose

## 2021-04-01 NOTE — Progress Notes (Signed)
   BP 122/70   Pulse 97   Temp 98.3 F (36.8 C) (Oral)   Resp 14   Ht '5\' 6"'$  (1.676 m)   Wt 123 lb (55.8 kg)   SpO2 98%   BMI 19.85 kg/m    Subjective:    Patient ID: [name], female    DOB: March 25, 1951, 70 y.o.   MRN: [id number]  HPI: Mary [name] is a 70 y.o. female  Chief Complaint  Patient presents with   Follow-up   Coumadin Pt taking coumadin for PAD and blood clot prevention. Goal is 2-3.  She is currently taking 5 mg/day 4 days a week a 1/2 pill 3 days a week.  No blood in urine or stool.  Denies headaches.  Tolerating well   Relevant past medical, surgical, family and social history reviewed and updated as indicated. Interim medical history since our last visit reviewed. Allergies and medications reviewed and updated.  Review of Systems  Per HPI unless specifically indicated above     Objective:    BP 122/70   Pulse 97   Temp 98.3 F (36.8 C) (Oral)   Resp 14   Ht '5\' 6"'$  (1.676 m)   Wt 123 lb (55.8 kg)   SpO2 98%   BMI 19.85 kg/m   Wt Readings from Last 3 Encounters:  04/01/21 123 lb (55.8 kg)  02/18/21 122 lb 8 oz (55.6 kg)  01/09/21 124 lb 14.4 oz (56.7 kg)    Physical Exam Constitutional:      General: She is not in acute distress.    Appearance: Normal appearance. She is well-developed.  HENT:     Head: Normocephalic and atraumatic.  Eyes:     General: Lids are normal. No scleral icterus.       Right eye: No discharge.        Left eye: No discharge.     Conjunctiva/sclera: Conjunctivae normal.  Cardiovascular:     Rate and Rhythm: Normal rate.  Pulmonary:     Effort: Pulmonary effort is normal.  Abdominal:     Palpations: There is no hepatomegaly or splenomegaly.  Musculoskeletal:        General: Normal range of motion.  Skin:    Coloration: Skin is not pale.     Findings: No rash.  Neurological:     Mental Status: She is alert and oriented to person, place, and time.  Psychiatric:        Behavior: Behavior normal.         Thought Content: Thought content normal.        Judgment: Judgment normal.    Results for orders placed or performed in visit on 04/01/21  POCT INR  Result Value Ref Range   INR 2.4 2.0 - 3.0      Assessment & Plan:   Problem List Items Addressed This Visit       Unprioritized   PAD (peripheral artery disease) (HCC) (Chronic)   Relevant Medications   warfarin (COUMADIN) 5 MG tablet   Anticoagulated on Coumadin - Primary    At goal today.  Continue present dose       Relevant Medications   warfarin (COUMADIN) 5 MG tablet   Other Relevant Orders   POCT INR (Completed)     Follow up plan: Return in about 6 weeks (around 05/13/2021), or if symptoms worsen or fail to improve.

## 2021-04-23 ENCOUNTER — Telehealth: Payer: Self-pay

## 2021-04-23 NOTE — Progress Notes (Signed)
Chronic Care Management Pharmacy Assistant   Name: Mary [name]  MRN: [id number] DOB: Aug 27, 1951  Reason for Encounter:Hypertension Disease State Call.   Recent office visits:  04/01/2021 Kathrine Haddock NP (PCP) No medication Changes Noted  Recent consult visits:  No Recent [facility] visits:  None in previous 6 months  Medications: Outpatient Encounter Medications as of 04/23/2021  Medication Sig   acetaminophen (TYLENOL) 500 MG tablet Take 500 mg by mouth 2 (two) times daily as needed for moderate pain or headache.   amLODipine (NORVASC) 5 MG tablet Take 1 tablet (5 mg total) by mouth daily.   atorvastatin (LIPITOR) 10 MG tablet Take 1 tablet (10 mg total) by mouth at bedtime.   cholecalciferol (VITAMIN D3) 25 MCG (1000 UNIT) tablet Take 2 tablets (2,000 Units total) by mouth daily.   potassium chloride (KLOR-CON 10) 10 MEQ tablet Take 1 tablet (10 mEq total) by mouth 2 (two) times daily.   vitamin E 400 UNIT capsule Take 180 Units by mouth daily.    warfarin (COUMADIN) 5 MG tablet Take 5 mg tablet Su, Tues, Wed, Th and Sa and take 1/2 tablet (2.5 mg) Mon, Fri   No facility-administered encounter medications on file as of 04/23/2021.    Care Gaps: COVID-19 Vaccine Influenza Vaccine Star Rating Drugs: Atorvastatin 10 mg last filled 02/18/2021 90 day supply (no pharmacy on file) Medication fill gaps: Amlodipine 5 MG last filled 11/26/2020 90  day supply   Reviewed chart prior to disease state call. Spoke with patient regarding BP  Recent Office Vitals: BP Readings from Last 3 Encounters:  04/01/21 122/70  02/18/21 130/68  01/09/21 112/72   Pulse Readings from Last 3 Encounters:  04/01/21 97  02/18/21 93  01/09/21 97    Wt Readings from Last 3 Encounters:  04/01/21 123 lb (55.8 kg)  02/18/21 122 lb 8 oz (55.6 kg)  01/09/21 124 lb 14.4 oz (56.7 kg)     Kidney Function Lab Results  Component Value Date/Time   CREATININE 0.79 01/09/2021 11:33 AM    CREATININE 0.86 07/29/2020 11:57 AM   GFRNONAA 77 01/09/2021 11:33 AM   GFRAA 90 01/09/2021 11:33 AM    BMP Latest Ref Rng & Units 01/09/2021 07/29/2020 02/05/2020  Glucose 65 - 99 mg/dL 84 54(L) 80  BUN 7 - 25 mg/dL 10 7 6(L)  Creatinine 0.50 - 0.99 mg/dL 0.79 0.86 0.81  BUN/Creat Ratio 6 - 22 (calc) NOT APPLICABLE NOT APPLICABLE 7  Sodium [id number] - 146 mmol/L 140 141 141  Potassium 3.5 - 5.3 mmol/L 4.8 3.6 3.7  Chloride 98 - 110 mmol/L 102 103 104  CO2 20 - 32 mmol/L 31 32 31  Calcium 8.6 - 10.4 mg/dL 9.4 9.7 9.7    Current antihypertensive regimen:  Amlodipine 5 mg daily   Patient denies headaches,dizziness,lightheadedness or any issue/side effects from Amlodipine. How often are you checking your Blood Pressure? 3-5x per week Current home BP readings:  Patient states her blood pressure have been ranging around 130/78. What recent interventions/DTPs have been made by any provider to improve Blood Pressure control since last CPP Visit: None ID Any recent hospitalizations or ED visits since last visit with CPP? No What diet changes have been made to improve Blood Pressure Control?  None ID What exercise is being done to improve your Blood Pressure Control?  Patient reports she walks daily.  Adherence Review: Is the patient currently on ACE/ARB medication? No Does the patient have >5 day gap  between last estimated fill dates? No  Anderson Malta Clinical Production designer, theatre/television/film [phone]

## 2021-05-15 ENCOUNTER — Ambulatory Visit (INDEPENDENT_AMBULATORY_CARE_PROVIDER_SITE_OTHER): Payer: Medicare HMO | Admitting: Unknown Physician Specialty

## 2021-05-15 ENCOUNTER — Encounter: Payer: Self-pay | Admitting: Unknown Physician Specialty

## 2021-05-15 ENCOUNTER — Other Ambulatory Visit: Payer: Self-pay

## 2021-05-15 VITALS — BP 120/74 | HR 92 | Temp 98.4°F | Resp 14 | Ht 66.0 in | Wt 123.6 lb

## 2021-05-15 DIAGNOSIS — Z7901 Long term (current) use of anticoagulants: Secondary | ICD-10-CM

## 2021-05-15 DIAGNOSIS — I10 Essential (primary) hypertension: Secondary | ICD-10-CM

## 2021-05-15 LAB — POCT INR: INR: 3 (ref 2.0–3.0)

## 2021-05-15 MED ORDER — AMLODIPINE BESYLATE 5 MG PO TABS
5.0000 mg | ORAL_TABLET | Freq: Every day | ORAL | 3 refills | Status: DC
Start: 2021-05-15 — End: 2022-04-02

## 2021-05-15 NOTE — Progress Notes (Signed)
BP 120/74 (BP Location: Right Arm, Patient Position: Sitting, Cuff Size: Small)   Pulse 92   Temp 98.4 F (36.9 C)   Resp 14   Ht '5\' 6"'$  (1.676 m) Comment: per chart  Wt 123 lb 9.6 oz (56.1 kg)   SpO2 97%   BMI 19.95 kg/m    Subjective:    Patient ID: Mary [name], female    DOB: 04-Jul-1951, 70 y.o.   MRN: [id number]  HPI: Mary [name] is a 70 y.o. female  Chief Complaint  Patient presents with   Follow-up    PT/INR   Pt is here for her 6 weeks PT/INR .  Goal INR is 2-3.  No nose bleeds, blood in urine or stool.  Feels well.  Denies mental status changes.  Takes 1/2 5 mg tablet Monday and Friday and full 5 mg other days of the week  Hypertension - Tking Amlodipine and doing well.  Home BP readings "good."  Denies SOB or chest pain  Relevant past medical, surgical, family and social history reviewed and updated as indicated. Interim medical history since our last visit reviewed. Allergies and medications reviewed and updated.  Review of Systems  Per HPI unless specifically indicated above     Objective:    BP 120/74 (BP Location: Right Arm, Patient Position: Sitting, Cuff Size: Small)   Pulse 92   Temp 98.4 F (36.9 C)   Resp 14   Ht '5\' 6"'$  (1.676 m) Comment: per chart  Wt 123 lb 9.6 oz (56.1 kg)   SpO2 97%   BMI 19.95 kg/m   Wt Readings from Last 3 Encounters:  05/15/21 123 lb 9.6 oz (56.1 kg)  04/01/21 123 lb (55.8 kg)  02/18/21 122 lb 8 oz (55.6 kg)    Physical Exam Constitutional:      General: She is not in acute distress.    Appearance: Normal appearance. She is well-developed.  HENT:     Head: Normocephalic and atraumatic.  Eyes:     General: Lids are normal. No scleral icterus.       Right eye: No discharge.        Left eye: No discharge.     Conjunctiva/sclera: Conjunctivae normal.  Neck:     Vascular: No carotid bruit or JVD.  Cardiovascular:     Rate and Rhythm: Normal rate and regular rhythm.     Heart sounds: Normal heart sounds.   Pulmonary:     Effort: Pulmonary effort is normal. No respiratory distress.     Breath sounds: Normal breath sounds.  Abdominal:     Palpations: There is no hepatomegaly or splenomegaly.  Musculoskeletal:        General: Normal range of motion.     Cervical back: Normal range of motion and neck supple.  Skin:    General: Skin is warm and dry.     Coloration: Skin is not pale.     Findings: No rash.  Neurological:     Mental Status: She is alert and oriented to person, place, and time.  Psychiatric:        Behavior: Behavior normal.        Thought Content: Thought content normal.        Judgment: Judgment normal.    Results for orders placed or performed in visit on 05/15/21  POCT INR  Result Value Ref Range   INR 3.0 2.0 - 3.0      Assessment & Plan:   Problem List  Items Addressed This Visit       Unprioritized   Anticoagulated on Coumadin - Primary    INR is 3.0 and at goal.  Continue present medications.        Relevant Orders   POCT INR (Completed)   Primary hypertension    Stable, continue present medications.        Relevant Medications   amLODipine (NORVASC) 5 MG tablet   Other Visit Diagnoses     Essential hypertension       Relevant Medications   amLODipine (NORVASC) 5 MG tablet        Follow up plan: Return in about 6 weeks (around 06/26/2021).

## 2021-05-15 NOTE — Assessment & Plan Note (Signed)
Stable, continue present medications.   

## 2021-05-15 NOTE — Assessment & Plan Note (Signed)
INR is 3.0 and at goal.  Continue present medications.

## 2021-06-26 ENCOUNTER — Other Ambulatory Visit: Payer: Self-pay

## 2021-06-26 ENCOUNTER — Ambulatory Visit (INDEPENDENT_AMBULATORY_CARE_PROVIDER_SITE_OTHER): Payer: Medicare HMO | Admitting: Unknown Physician Specialty

## 2021-06-26 ENCOUNTER — Encounter: Payer: Self-pay | Admitting: Unknown Physician Specialty

## 2021-06-26 VITALS — BP 120/70 | HR 86 | Temp 98.3°F | Resp 16 | Ht 66.0 in | Wt 124.0 lb

## 2021-06-26 DIAGNOSIS — Z7901 Long term (current) use of anticoagulants: Secondary | ICD-10-CM | POA: Diagnosis not present

## 2021-06-26 DIAGNOSIS — I739 Peripheral vascular disease, unspecified: Secondary | ICD-10-CM

## 2021-06-26 LAB — POCT INR: INR: 3.2 — AB (ref 2.0–3.0)

## 2021-06-26 NOTE — Progress Notes (Signed)
   BP 120/70   Pulse 86   Temp 98.3 F (36.8 C)   Resp 16   Ht 5\' 6"  (1.676 m)   Wt 124 lb (56.2 kg)   SpO2 98%   BMI 20.01 kg/m    Subjective:    Patient ID: [name], female    DOB: 1951/08/12, 70 y.o.   MRN: [id number]  HPI: Mary [name] is a 70 y.o. female  Chief Complaint  Patient presents with   Follow-up   Pt is here for 6 week PT/INR.  Goal is 2-3 No nose bleeds, blood in urine or stool.  Denies mental status changes.  Takes 1/2 of 5 mg tablet Monday and Friday and a full 5 mg other days of the week Relevant past medical, surgical, family and social history reviewed and updated as indicated. Interim medical history since our last visit reviewed. Allergies and medications reviewed and updated.  Review of Systems  Constitutional: Negative.   Respiratory: Negative.    Psychiatric/Behavioral: Negative.     Per HPI unless specifically indicated above     Objective:    BP 120/70   Pulse 86   Temp 98.3 F (36.8 C)   Resp 16   Ht 5\' 6"  (1.676 m)   Wt 124 lb (56.2 kg)   SpO2 98%   BMI 20.01 kg/m   Wt Readings from Last 3 Encounters:  06/26/21 124 lb (56.2 kg)  05/15/21 123 lb 9.6 oz (56.1 kg)  04/01/21 123 lb (55.8 kg)    Physical Exam Constitutional:      General: She is not in acute distress.    Appearance: Normal appearance. She is well-developed.  HENT:     Head: Normocephalic and atraumatic.  Eyes:     General: Lids are normal. No scleral icterus.       Right eye: No discharge.        Left eye: No discharge.     Conjunctiva/sclera: Conjunctivae normal.  Cardiovascular:     Rate and Rhythm: Normal rate.  Pulmonary:     Effort: Pulmonary effort is normal.  Abdominal:     Palpations: There is no hepatomegaly or splenomegaly.  Musculoskeletal:        General: Normal range of motion.  Skin:    Coloration: Skin is not pale.     Findings: No rash.  Neurological:     Mental Status: She is alert and oriented to person, place, and time.   Psychiatric:        Behavior: Behavior normal.        Thought Content: Thought content normal.        Judgment: Judgment normal.    Results for orders placed or performed in visit on 06/26/21  POCT INR  Result Value Ref Range   INR 3.2 (A) 2.0 - 3.0      Assessment & Plan:   Problem List Items Addressed This Visit       Unprioritized   PAD (peripheral artery disease) (HCC) (Chronic)    Anti-coagulated on Coumadin.  INR is 3.2 and above goal.  Will take 1/2 tab M,W,F from twice a day and take 5 mg the other days.  Recheck in 2 weeks      Anticoagulated on Coumadin - Primary   Relevant Orders   POCT INR (Completed)     Follow up plan: Return in about 2 weeks (around 07/10/2021).

## 2021-06-26 NOTE — Assessment & Plan Note (Addendum)
Anti-coagulated on Coumadin.  INR is 3.2 and above goal.  Will take 1/2 tab M,W,F from twice a day and take 5 mg the other days.  Recheck in 2 weeks

## 2021-07-10 ENCOUNTER — Other Ambulatory Visit: Payer: Self-pay

## 2021-07-10 ENCOUNTER — Encounter: Payer: Self-pay | Admitting: Unknown Physician Specialty

## 2021-07-10 ENCOUNTER — Ambulatory Visit (INDEPENDENT_AMBULATORY_CARE_PROVIDER_SITE_OTHER): Payer: Medicare HMO | Admitting: Unknown Physician Specialty

## 2021-07-10 VITALS — BP 136/74 | HR 96 | Temp 98.4°F | Resp 16 | Ht 66.0 in | Wt 125.3 lb

## 2021-07-10 DIAGNOSIS — Z7901 Long term (current) use of anticoagulants: Secondary | ICD-10-CM

## 2021-07-10 LAB — POCT INR: INR: 2.3 (ref 2.0–3.0)

## 2021-07-10 NOTE — Progress Notes (Signed)
   BP 136/74   Pulse 96   Temp 98.4 F (36.9 C) (Oral)   Resp 16   Ht 5\' 6"  (1.676 m)   Wt 125 lb 4.8 oz (56.8 kg)   SpO2 99%   BMI 20.22 kg/m    Subjective:    Patient ID: Mary [name], female    DOB: Sep 07, 1950, 70 y.o.   MRN: [id number]  HPI: Mary [name] is a 70 y.o. female  Chief Complaint  Patient presents with   Follow-up   INR last visit 3.2 and above goal.  Today she is 2.3.  Denies blood in urine or sputum.  No headache or personality changes  Relevant past medical, surgical, family and social history reviewed and updated as indicated. Interim medical history since our last visit reviewed. Allergies and medications reviewed and updated.  Review of Systems  Per HPI unless specifically indicated above     Objective:    BP 136/74   Pulse 96   Temp 98.4 F (36.9 C) (Oral)   Resp 16   Ht 5\' 6"  (1.676 m)   Wt 125 lb 4.8 oz (56.8 kg)   SpO2 99%   BMI 20.22 kg/m   Wt Readings from Last 3 Encounters:  07/10/21 125 lb 4.8 oz (56.8 kg)  06/26/21 124 lb (56.2 kg)  05/15/21 123 lb 9.6 oz (56.1 kg)    Physical Exam Constitutional:      General: She is not in acute distress.    Appearance: Normal appearance. She is well-developed.  HENT:     Head: Normocephalic and atraumatic.  Eyes:     General: Lids are normal. No scleral icterus.       Right eye: No discharge.        Left eye: No discharge.     Conjunctiva/sclera: Conjunctivae normal.  Cardiovascular:     Rate and Rhythm: Normal rate.  Pulmonary:     Effort: Pulmonary effort is normal.  Abdominal:     Palpations: There is no hepatomegaly or splenomegaly.  Musculoskeletal:        General: Normal range of motion.  Skin:    Coloration: Skin is not pale.     Findings: No rash.  Neurological:     Mental Status: She is alert and oriented to person, place, and time.  Psychiatric:        Behavior: Behavior normal.        Thought Content: Thought content normal.        Judgment: Judgment normal.     Results for orders placed or performed in visit on 07/10/21  POCT INR  Result Value Ref Range   INR 2.3 2.0 - 3.0      Assessment & Plan:   Problem List Items Addressed This Visit       Unprioritized   Anticoagulated on Coumadin - Primary    Currently INR at goal between 2-3.  Continue present dose      Relevant Orders   POCT INR (Completed)     Follow up plan: Return in about 6 weeks (around 08/21/2021).

## 2021-07-14 ENCOUNTER — Ambulatory Visit: Payer: Medicare HMO | Admitting: Family Medicine

## 2021-07-14 ENCOUNTER — Telehealth: Payer: Self-pay

## 2021-07-14 NOTE — Progress Notes (Signed)
Chronic Care Management Pharmacy Assistant   Name: Mary [name]  MRN: [id number] DOB: [birth year]  Reason for Encounter: Hypertension Disease State Call   Recent office visits:  07/10/2021 Kathrine Haddock, NP (PCP Office Visit) for Follow-up- No medication changes noted, POCT INR order placed, patient instructed to return in 6 weeks  06/26/2021 Kathrine Haddock, NP (PCP Office Visit) for Follow-up- No medication changes noted, POCT INR order placed, patient instructed to return in 2 weeks  05/15/2021 Kathrine Haddock, NP (PCP Office Visit) for Follow-up- No medication changes noted, POCT INR order placed, patient instructed to return in 6 weeks  Recent consult visits:  None ID  Hospital visits:  None in previous 6 months  Medications: Outpatient Encounter Medications as of 07/14/2021  Medication Sig   acetaminophen (TYLENOL) 500 MG tablet Take 500 mg by mouth 2 (two) times daily as needed for moderate pain or headache.   amLODipine (NORVASC) 5 MG tablet Take 1 tablet (5 mg total) by mouth daily.   atorvastatin (LIPITOR) 10 MG tablet Take 1 tablet (10 mg total) by mouth at bedtime.   cholecalciferol (VITAMIN D3) 25 MCG (1000 UNIT) tablet Take 2 tablets (2,000 Units total) by mouth daily.   potassium chloride (KLOR-CON 10) 10 MEQ tablet Take 1 tablet (10 mEq total) by mouth 2 (two) times daily.   vitamin E 400 UNIT capsule Take 180 Units by mouth daily.    warfarin (COUMADIN) 5 MG tablet Take 5 mg tablet Su, Tues, Wed, Th and Sa and take 1/2 tablet (2.5 mg) Mon, Fri   No facility-administered encounter medications on file as of 07/14/2021.   Care Gaps: PNA Vaccine Zoster Vaccines COVID-19 Vaccine Booster 3 Dexa Scan  Star Rating Drugs: Atorvastatin 10 mg last filled on 05/06/2021 for a 90-Day supply with [facility]  Reviewed chart prior to disease state call. Spoke with patient regarding BP  Recent Office Vitals: BP Readings from Last 3 Encounters:  07/10/21 136/74   06/26/21 120/70  05/15/21 120/74   Pulse Readings from Last 3 Encounters:  07/10/21 96  06/26/21 86  05/15/21 92    Wt Readings from Last 3 Encounters:  07/10/21 125 lb 4.8 oz (56.8 kg)  06/26/21 124 lb (56.2 kg)  05/15/21 123 lb 9.6 oz (56.1 kg)     Kidney Function Lab Results  Component Value Date/Time   CREATININE 0.79 01/09/2021 11:33 AM   CREATININE 0.86 07/29/2020 11:57 AM   GFRNONAA 77 01/09/2021 11:33 AM   GFRAA 90 01/09/2021 11:33 AM    BMP Latest Ref Rng & Units 01/09/2021 07/29/2020 02/05/2020  Glucose 65 - 99 mg/dL 84 54(L) 80  BUN 7 - 25 mg/dL 10 7 6(L)  Creatinine 0.50 - 0.99 mg/dL 0.79 0.86 0.81  BUN/Creat Ratio 6 - 22 (calc) NOT APPLICABLE NOT APPLICABLE 7  Sodium 867 - 146 mmol/L 140 141 141  Potassium 3.5 - 5.3 mmol/L 4.8 3.6 3.7  Chloride 98 - 110 mmol/L 102 103 104  CO2 20 - 32 mmol/L 31 32 31  Calcium 8.6 - 10.4 mg/dL 9.4 9.7 9.7    Current antihypertensive regimen:  Amlodipine 5 mg 1 tablet daily  How often are you checking your Blood Pressure? 1-2x per week  Current home BP readings: 136/74  What recent interventions/DTPs have been made by any provider to improve Blood Pressure control since last CPP Visit: None ID  Any recent hospitalizations or ED visits since last visit with CPP? No  What diet changes have been made  to improve Blood Pressure Control?  Patient reports that she eats the same she hasn't made any changes to her diet. She is on Warfarin so she does have to watch certain foods she eats, but other than that she eat's normal  What exercise is being done to improve your Blood Pressure Control?  Patient stated she walks when she can.   Adherence Review: Is the patient currently on ACE/ARB medication? No Does the patient have >5 day gap between last estimated fill dates? No  Patient stated she is doing well. Patient stated that she has no issues with her blood pressure. She denies any hypertensive symptoms. Patient stated her INR  was a little elevated a few weeks ago so now she is taking 1/2  tab of Warfarin on M, W, F and 5 mg the other days. Patient stated she is taking all her other medications as directed, and denies needing any refills today. Patient has no concerns or issues at this time.   Patient has telephone appointment with Junius Argyle, CPP on 09/03/2020 @ [place], CPA/CMA Catering manager Phone: [phone]

## 2021-07-17 NOTE — Assessment & Plan Note (Signed)
Currently INR at goal between 2-3.  Continue present dose

## 2021-08-21 ENCOUNTER — Ambulatory Visit (INDEPENDENT_AMBULATORY_CARE_PROVIDER_SITE_OTHER): Payer: Medicare HMO | Admitting: Nurse Practitioner

## 2021-08-21 ENCOUNTER — Encounter: Payer: Self-pay | Admitting: Nurse Practitioner

## 2021-08-21 VITALS — BP 136/72 | HR 97 | Temp 98.6°F | Resp 16 | Ht 66.0 in | Wt 128.4 lb

## 2021-08-21 DIAGNOSIS — Z5181 Encounter for therapeutic drug level monitoring: Secondary | ICD-10-CM

## 2021-08-21 DIAGNOSIS — D689 Coagulation defect, unspecified: Secondary | ICD-10-CM

## 2021-08-21 DIAGNOSIS — Z7901 Long term (current) use of anticoagulants: Secondary | ICD-10-CM

## 2021-08-21 DIAGNOSIS — I739 Peripheral vascular disease, unspecified: Secondary | ICD-10-CM

## 2021-08-21 LAB — POCT INR: INR: 2.6 (ref 2.0–3.0)

## 2021-08-21 NOTE — Progress Notes (Addendum)
° °  BP 136/72    Pulse 97    Temp 98.6 F (37 C)    Resp 16    Ht 5\' 6"  (1.676 m)    Wt 128 lb 6.4 oz (58.2 kg)    SpO2 98%    BMI 20.72 kg/m    Subjective:    Patient ID: Mary [name], female    DOB: 1950-10-23, 69 y.o.   MRN: [id number]  HPI: Mary [name] is a 70 y.o. female  Chief Complaint  Patient presents with   Coagulation Disorder   Coagulation disorder:  She is currently taking warfarin 5 mg daily on Tuesday, Thursday, Saturday and Sunday.  On Monday, Wednesday and Friday she takes 1/2 pill. She denies any abnormal bleeding, headaches or personality changes.  Her INR 2.3 at last visit, today it is 2.6.    PAD: She is currently on warfarin as listed above.  She Has been seen by Dr. Dian Situ Vascular surgery and has had multiple right lower extremity revascularizations.   Relevant past medical, surgical, family and social history reviewed and updated as indicated. Interim medical history since our last visit reviewed. Allergies and medications reviewed and updated.  Review of Systems  Constitutional: Negative for fever or weight change.  Respiratory: Negative for cough and shortness of breath.   Cardiovascular: Negative for chest pain or palpitations.  Gastrointestinal: Negative for abdominal pain, no bowel changes.  Musculoskeletal: Negative for gait problem or joint swelling.  Skin: Negative for rash.  Neurological: Negative for dizziness or headache.  No other specific complaints in a complete review of systems (except as listed in HPI above).      Objective:    BP 136/72    Pulse 97    Temp 98.6 F (37 C)    Resp 16    Ht 5\' 6"  (1.676 m)    Wt 128 lb 6.4 oz (58.2 kg)    SpO2 98%    BMI 20.72 kg/m   Wt Readings from Last 3 Encounters:  08/21/21 128 lb 6.4 oz (58.2 kg)  07/10/21 125 lb 4.8 oz (56.8 kg)  06/26/21 124 lb (56.2 kg)    Physical Exam  Constitutional: Patient appears well-developed and well-nourished.  No distress.  HEENT: head atraumatic, normocephalic,  pupils equal and reactive to light,  neck supple Cardiovascular: Normal rate, regular rhythm and normal heart sounds.  No murmur heard. No BLE edema. Pulmonary/Chest: Effort normal and breath sounds normal. No respiratory distress. Abdominal: Soft.  There is no tenderness. Psychiatric: Patient has a normal mood and affect. behavior is normal. Judgment and thought content normal.   Results for orders placed or performed in visit on 08/21/21  POCT INR  Result Value Ref Range   INR 2.6 2.0 - 3.0      Assessment & Plan:   1. Anticoagulated on Coumadin -INR 2.6 continue current dose - recheck in 6 weeks - POCT INR   2. Anticoagulated on Coumadin  - POCT INR  3. PAD (peripheral artery disease) (HCC)  - POCT INR  4. Medication monitoring encounter  - POCT INR     Follow up plan: Return in about 6 weeks (around 10/02/2021) for follow up.

## 2021-08-21 NOTE — Progress Notes (Deleted)
° °  BP 136/72    Pulse 97    Temp 98.6 F (37 C)    Resp 16    Ht 5\' 6"  (1.676 m)    Wt 128 lb 6.4 oz (58.2 kg)    SpO2 98%    BMI 20.72 kg/m    Subjective:    Patient ID: Mary [name], female    DOB: 1950-10-20, 70 y.o.   MRN: [id number]  HPI: Mary [name] is a 70 y.o. female  Chief Complaint  Patient presents with   Coagulation Disorder   Coagulation disorder:  She is currently taking warfarin 5 mg daily on Tuesday, Thursday, Saturday and Sunday.  On Monday, Wednesday and Friday she takes 1/2 pill. She denies any abnormal bleeding, headaches or personality changes.  Her INR 2.3 at last visit, today it is 2.6.    Relevant past medical, surgical, family and social history reviewed and updated as indicated. Interim medical history since our last visit reviewed. Allergies and medications reviewed and updated.  Review of Systems  Constitutional: Negative for fever or weight change.  Respiratory: Negative for cough and shortness of breath.   Cardiovascular: Negative for chest pain or palpitations.  Gastrointestinal: Negative for abdominal pain, no bowel changes.  Musculoskeletal: Negative for gait problem or joint swelling.  Skin: Negative for rash.  Neurological: Negative for dizziness or headache.  No other specific complaints in a complete review of systems (except as listed in HPI above).      Objective:    BP 136/72    Pulse 97    Temp 98.6 F (37 C)    Resp 16    Ht 5\' 6"  (1.676 m)    Wt 128 lb 6.4 oz (58.2 kg)    SpO2 98%    BMI 20.72 kg/m   Wt Readings from Last 3 Encounters:  08/21/21 128 lb 6.4 oz (58.2 kg)  07/10/21 125 lb 4.8 oz (56.8 kg)  06/26/21 124 lb (56.2 kg)    Physical Exam  Constitutional: Patient appears well-developed and well-nourished.  No distress.  HEENT: head atraumatic, normocephalic, pupils equal and reactive to light,  neck supple Cardiovascular: Normal rate, regular rhythm and normal heart sounds.  No murmur heard. No BLE  edema. Pulmonary/Chest: Effort normal and breath sounds normal. No respiratory distress. Abdominal: Soft.  There is no tenderness. Psychiatric: Patient has a normal mood and affect. behavior is normal. Judgment and thought content normal.   Results for orders placed or performed in visit on 08/21/21  POCT INR  Result Value Ref Range   INR 2.6 2.0 - 3.0      Assessment & Plan:   1. Anticoagulated on Coumadin -INR 2.6 continue current dose - recheck in 6 weeks - POCT INR     Follow up plan: Return in about 6 weeks (around 10/02/2021) for follow up.

## 2021-09-02 ENCOUNTER — Telehealth: Payer: Self-pay

## 2021-09-02 NOTE — Progress Notes (Signed)
Chronic Care Management Pharmacy Assistant   Name: AZALIE HARBECK  MRN: [id number] DOB: 1951-04-26  Patient called to be reminded of her telephone appointment with Junius Argyle, CPP on 09/03/2021 @1400   Patient stated she had other appointments tomorrow so she would not be home. Patient had to reschedule her appointment. Due to the CPP's schedule being so booked I was able to reschedule patient for 12/24/2021 @ 1100.  Patient reports she followed-up with PCP last month, and has an appointment with them next month as well. Patient reports that she is doing good, and is taking all her medications as directed with no problems. Patient has no concerns or issues today. I completed HTN assessment since she will not speak with CPP until April.   Recent office visits: 08/21/2021 Serafina Royals, FNP (PCP Office Visit) for Coagulation Disorder- No medication changes noted, lab order placed, patient instructed to follow-up in 6 weeks  Recent consult visits:  None ID  Hospital visits:  None in previous 6 months   Medications: Outpatient Encounter Medications as of 09/02/2021  Medication Sig   acetaminophen (TYLENOL) 500 MG tablet Take 500 mg by mouth 2 (two) times daily as needed for moderate pain or headache.   amLODipine (NORVASC) 5 MG tablet Take 1 tablet (5 mg total) by mouth daily.   atorvastatin (LIPITOR) 10 MG tablet Take 1 tablet (10 mg total) by mouth at bedtime.   cholecalciferol (VITAMIN D3) 25 MCG (1000 UNIT) tablet Take 2 tablets (2,000 Units total) by mouth daily.   potassium chloride (KLOR-CON 10) 10 MEQ tablet Take 1 tablet (10 mEq total) by mouth 2 (two) times daily.   vitamin E 400 UNIT capsule Take 180 Units by mouth daily.    warfarin (COUMADIN) 5 MG tablet Take 5 mg tablet Su, Tues, Wed, Th and Sa and take 1/2 tablet (2.5 mg) Mon, Fri   No facility-administered encounter medications on file as of 09/02/2021.   Recent Office Vitals: BP Readings from Last 3 Encounters:  08/21/21  136/72  07/10/21 136/74  06/26/21 120/70   Pulse Readings from Last 3 Encounters:  08/21/21 97  07/10/21 96  06/26/21 86    Wt Readings from Last 3 Encounters:  08/21/21 128 lb 6.4 oz (58.2 kg)  07/10/21 125 lb 4.8 oz (56.8 kg)  06/26/21 124 lb (56.2 kg)     Kidney Function Lab Results  Component Value Date/Time   CREATININE 0.79 01/09/2021 11:33 AM   CREATININE 0.86 07/29/2020 11:57 AM   GFRNONAA 77 01/09/2021 11:33 AM   GFRAA 90 01/09/2021 11:33 AM    BMP Latest Ref Rng & Units 01/09/2021 07/29/2020 02/05/2020  Glucose 65 - 99 mg/dL 84 54(L) 80  BUN 7 - 25 mg/dL 10 7 6(L)  Creatinine 0.50 - 0.99 mg/dL 0.79 0.86 0.81  BUN/Creat Ratio 6 - 22 (calc) NOT APPLICABLE NOT APPLICABLE 7  Sodium 893 - 146 mmol/L 140 141 141  Potassium 3.5 - 5.3 mmol/L 4.8 3.6 3.7  Chloride 98 - 110 mmol/L 102 103 104  CO2 20 - 32 mmol/L 31 32 31  Calcium 8.6 - 10.4 mg/dL 9.4 9.7 9.7    Current antihypertensive regimen:  Amlodipine 5 mg 1 tablet daily   How often are you checking your Blood Pressure? 1-2x per week  Current home BP readings: 136/72  What recent interventions/DTPs have been made by any provider to improve Blood Pressure control since last CPP Visit: None ID  Any recent hospitalizations or ED visits since last  visit with CPP? No'  What diet changes have been made to improve Blood Pressure Control?  Patient stated nothing has changed since last month when we spoke. She is still on Warfarin so she does have certain foods she can't eat other than that she eats a normal diet.   What exercise is being done to improve your Blood Pressure Control?  Patient still walks when she cans  Adherence Review: Is the patient currently on ACE/ARB medication? No Does the patient have >5 day gap between last estimated fill dates? No  Star Rating Drug: Atorvastatin 10 mg last filled on 05/06/2021 for a 90-Day supply with [facility]  Any gaps in medications fill history? None  Care  Gaps: PNA Vaccine Zoster Vaccine Colonoscopy (last completed 07/06/2018) Insurance will only cover every 10 years unless patient is high risk Dexa Scan   Lynann Bologna, CPA/CMA Clinical Pharmacist Assistant Phone: [phone]

## 2021-09-03 ENCOUNTER — Telehealth: Payer: Self-pay

## 2021-09-29 ENCOUNTER — Emergency Department
Admission: EM | Admit: 2021-09-29 | Discharge: 2021-09-29 | Disposition: A | Discharge status: Home / Self Care | Payer: Medicare HMO | Attending: Emergency Medicine | Admitting: Emergency Medicine

## 2021-09-29 ENCOUNTER — Emergency Department: Payer: Medicare HMO

## 2021-09-29 ENCOUNTER — Encounter: Payer: Self-pay | Admitting: Emergency Medicine

## 2021-09-29 ENCOUNTER — Other Ambulatory Visit: Payer: Self-pay

## 2021-09-29 DIAGNOSIS — I2510 Atherosclerotic heart disease of native coronary artery without angina pectoris: Secondary | ICD-10-CM | POA: Insufficient documentation

## 2021-09-29 DIAGNOSIS — H538 Other visual disturbances: Secondary | ICD-10-CM | POA: Insufficient documentation

## 2021-09-29 DIAGNOSIS — R112 Nausea with vomiting, unspecified: Secondary | ICD-10-CM | POA: Insufficient documentation

## 2021-09-29 DIAGNOSIS — G319 Degenerative disease of nervous system, unspecified: Secondary | ICD-10-CM | POA: Diagnosis not present

## 2021-09-29 DIAGNOSIS — I10 Essential (primary) hypertension: Secondary | ICD-10-CM | POA: Insufficient documentation

## 2021-09-29 DIAGNOSIS — I739 Peripheral vascular disease, unspecified: Secondary | ICD-10-CM | POA: Diagnosis not present

## 2021-09-29 LAB — HEPATIC FUNCTION PANEL
ALT: 14 U/L (ref 0–44)
AST: 22 U/L (ref 15–41)
Albumin: 4.2 g/dL (ref 3.5–5.0)
Alkaline Phosphatase: 82 U/L (ref 38–126)
Bilirubin, Direct: 0.1 mg/dL (ref 0.0–0.2)
Indirect Bilirubin: 0.4 mg/dL (ref 0.3–0.9)
Total Bilirubin: 0.5 mg/dL (ref 0.3–1.2)
Total Protein: 7.7 g/dL (ref 6.5–8.1)

## 2021-09-29 LAB — PROTIME-INR
INR: 1.6 — ABNORMAL HIGH (ref 0.8–1.2)
Prothrombin Time: 19.2 seconds — ABNORMAL HIGH (ref 11.4–15.2)

## 2021-09-29 LAB — BASIC METABOLIC PANEL
Anion gap: 9 (ref 5–15)
BUN: 10 mg/dL (ref 8–23)
CO2: 27 mmol/L (ref 22–32)
Calcium: 9.1 mg/dL (ref 8.9–10.3)
Chloride: 101 mmol/L (ref 98–111)
Creatinine, Ser: 0.68 mg/dL (ref 0.44–1.00)
GFR, Estimated: >60 mL/min (ref >60)
Glucose, Bld: 103 mg/dL — ABNORMAL HIGH (ref 70–99)
Potassium: 3.6 mmol/L (ref 3.5–5.1)
Sodium: 137 mmol/L (ref 135–145)

## 2021-09-29 LAB — CBC
HCT: 41.7 % (ref 36.0–46.0)
Hemoglobin: 13.7 g/dL (ref 12.0–15.0)
MCH: 29.7 pg (ref 26.0–34.0)
MCHC: 32.9 g/dL (ref 30.0–36.0)
MCV: 90.5 fL (ref 80.0–100.0)
Platelets: 292 10*3/uL (ref 150–400)
RBC: 4.61 MIL/uL (ref 3.87–5.11)
RDW: 13.7 % (ref 11.5–15.5)
WBC: 5 10*3/uL (ref 4.0–10.5)
nRBC: 0 % (ref 0.0–0.2)

## 2021-09-29 LAB — LIPASE, BLOOD: Lipase: 59 U/L — ABNORMAL HIGH (ref 11–51)

## 2021-09-29 MED ORDER — ONDANSETRON 4 MG PO TBDP: 4.0000 mg | ORAL_TABLET | Freq: Three times a day (TID) | ORAL | 0 refills | Status: DC | PRN

## 2021-09-29 MED ORDER — LACTATED RINGERS IV BOLUS
1000.0000 mL | Freq: Once | INTRAVENOUS | Status: AC
  Administered 2021-09-29: 1000 mL via INTRAVENOUS

## 2021-09-29 MED ORDER — ONDANSETRON HCL 4 MG/2ML IJ SOLN
4.0000 mg | Freq: Once | INTRAMUSCULAR | Status: AC
  Administered 2021-09-29: 4 mg via INTRAVENOUS
  Filled 2021-09-29: qty 2

## 2021-09-29 NOTE — ED Triage Notes (Signed)
Pt via POV from home. Pt c/o nausea/vomiting, blurry vision "its not focused" that started this AM. Pt states that last time this happened her BP was elevated couple years ago. Denies any pain at this time. Denies diarrhea. Denies any recent head injuries. Pt is A&OX4 and NAD

## 2021-09-29 NOTE — ED Provider Notes (Signed)
[facility] Provider Note    Event Date/Time   First MD Initiated Contact with Patient 09/29/21 1750     (approximate)   History   Chief Complaint Emesis   HPI  Mary [name] is a 71 y.o. female with past medical history of hypertension, hyperlipidemia, CAD, and PAD on Coumadin who presents to the ED complaining of nausea and vomiting.  Patient reports around 1030 this morning she acutely began to feel nauseous and had a couple episodes of vomiting.  She states that after she vomited she had a discomfort in her epigastrium that seem to move upwards, although this is improved since then.  She continues to feel nauseous but has not had any more vomiting.  She denies any pain in her chest or difficulty breathing, has not had any fevers or dysuria.  She does state that later in the afternoon she began to feel like her vision in both eyes was slightly blurry.  She denies any visual field deficits or pain in either eye.  She has not had any speech changes, numbness or weakness in her extremities.     Physical Exam   Triage Vital Signs: ED Triage Vitals [09/29/21 1419]  Enc Vitals Group     BP (!) 145/93     Pulse Rate 91     Resp 18     Temp 98.1 F (36.7 C)     Temp Source Oral     SpO2 99 %     Weight 126 lb (57.2 kg)     Height 5\' 6"  (1.676 m)     Head Circumference      Peak Flow      Pain Score 0     Pain Loc      Pain Edu?      Excl. in [place]?     Most recent vital signs: Vitals:   09/29/21 1419 09/29/21 1935  BP: (!) 145/93 138/86  Pulse: 91 85  Resp: 18 19  Temp: 98.1 F (36.7 C)   SpO2: 99% 98%    Constitutional: Alert and oriented. Eyes: Conjunctivae are normal.  Pupils equal, round, and reactive to light bilaterally. Head: Atraumatic. Nose: No congestion/rhinnorhea. Mouth/Throat: Mucous membranes are moist.  Cardiovascular: Normal rate, regular rhythm. Grossly normal heart sounds.  2+ radial pulses bilaterally. Respiratory: Normal  respiratory effort.  No retractions. Lungs CTAB. Gastrointestinal: Soft and nontender. No distention. Musculoskeletal: No lower extremity tenderness nor edema.  Neurologic:  Normal speech and language. No gross focal neurologic deficits are appreciated.    ED Results / Procedures / Treatments   Labs (all labs ordered are listed, but only abnormal results are displayed) Labs Reviewed  BASIC METABOLIC PANEL - Abnormal; Notable for the following components:      Result Value   Glucose, Bld 103 (*)    All other components within normal limits  LIPASE, BLOOD - Abnormal; Notable for the following components:   Lipase 59 (*)    All other components within normal limits  PROTIME-INR - Abnormal; Notable for the following components:   Prothrombin Time 19.2 (*)    INR 1.6 (*)    All other components within normal limits  CBC  HEPATIC FUNCTION PANEL  URINALYSIS, ROUTINE W REFLEX MICROSCOPIC     EKG  ED ECG REPORT I, Blake Divine, the attending physician, personally viewed and interpreted this ECG.   Date: 09/29/2021  EKG Time: 18:44  Rate: 79  Rhythm: normal sinus rhythm, sinus arrhythmia  Axis: Normal  Intervals:none  ST&T Change: None  RADIOLOGY CT head reviewed by me with no obvious hemorrhage or midline shift.  PROCEDURES:  Critical Care performed: No  Procedures   MEDICATIONS ORDERED IN ED: Medications  lactated ringers bolus 1,000 mL (1,000 mLs Intravenous New Bag/Given 09/29/21 1934)  ondansetron (ZOFRAN) injection 4 mg (4 mg Intravenous Given 09/29/21 1934)     IMPRESSION / MDM / ASSESSMENT AND PLAN / ED COURSE  I reviewed the triage vital signs and the nursing notes.                              71 y.o. female with past medical history of hypertension, hyperlipidemia, CAD, and PAD on Coumadin who presents to the ED complaining of acute onset nausea and vomiting associated with epigastric discomfort, followed by blurry vision developing later in the  day.  Differential diagnosis includes, but is not limited to, stroke, elevated ICP, pancreatitis, hepatitis, cholecystitis, biliary colic, ACS, UTI.  Patient is nontoxic-appearing and in no acute distress, vital signs are reassuring and she has no focal neurologic deficits on exam.  Her abdominal exam is completely benign and she reports resolution of pain, does state that she continues to feel nauseous.  CBC shows no leukocytosis or anemia, BMP with no electrolyte abnormality or AKI.  We will add on LFTs and lipase but hold off on CT imaging of her abdomen given benign exam.  She does report bilateral blurry vision, low suspicion for ocular pathology given symptoms are bilateral.  She has no focal neurologic deficits on exam but given visual changes we will check CT head for stroke or other intracranial process.  We will treat symptomatically with IV Zofran and hydrate with IV fluids.  CT head is negative for acute process, LFTs are within normal limits, lipase also unremarkable.  Patient reports nausea is improved on reassessment but continues to complain of bilateral blurry vision.  She was offered MRI to further assess for stroke, but declines and is requesting to be discharged home.  She understands that we cannot completely rule out acute stroke given recent onset of symptoms.  She was counseled to follow-up with her PCP and to return to the ED for new worsening symptoms, patient agrees with plan.       FINAL CLINICAL IMPRESSION(S) / ED DIAGNOSES   Final diagnoses:  Nausea and vomiting, unspecified vomiting type  Blurry vision, bilateral     Rx / DC Orders   ED Discharge Orders          Ordered    ondansetron (ZOFRAN-ODT) 4 MG disintegrating tablet  Every 8 hours PRN        09/29/21 2059             Note:  This document was prepared using Dragon voice recognition software and may include unintentional dictation errors.   Blake Divine, MD 09/29/21 2100

## 2021-10-02 ENCOUNTER — Ambulatory Visit (INDEPENDENT_AMBULATORY_CARE_PROVIDER_SITE_OTHER): Payer: Medicare HMO | Admitting: Nurse Practitioner

## 2021-10-02 ENCOUNTER — Encounter: Payer: Self-pay | Admitting: Nurse Practitioner

## 2021-10-02 ENCOUNTER — Other Ambulatory Visit: Payer: Self-pay

## 2021-10-02 VITALS — BP 120/72 | HR 100 | Temp 98.3°F | Resp 16 | Ht 66.0 in | Wt 122.2 lb

## 2021-10-02 DIAGNOSIS — H5340 Unspecified visual field defects: Secondary | ICD-10-CM | POA: Diagnosis not present

## 2021-10-02 DIAGNOSIS — Z7901 Long term (current) use of anticoagulants: Secondary | ICD-10-CM | POA: Diagnosis not present

## 2021-10-02 DIAGNOSIS — D689 Coagulation defect, unspecified: Secondary | ICD-10-CM

## 2021-10-02 DIAGNOSIS — I739 Peripheral vascular disease, unspecified: Secondary | ICD-10-CM

## 2021-10-02 DIAGNOSIS — H538 Other visual disturbances: Secondary | ICD-10-CM | POA: Diagnosis not present

## 2021-10-02 DIAGNOSIS — R630 Anorexia: Secondary | ICD-10-CM | POA: Diagnosis not present

## 2021-10-02 DIAGNOSIS — Z5181 Encounter for therapeutic drug level monitoring: Secondary | ICD-10-CM

## 2021-10-02 LAB — POCT INR: INR: 1.6 — AB (ref 2.0–3.0)

## 2021-10-02 NOTE — Progress Notes (Signed)
BP 120/72    Pulse 100    Temp 98.3 F (36.8 C) (Oral)    Resp 16    Ht 5\' 6"  (1.676 m)    Wt 122 lb 3.2 oz (55.4 kg)    SpO2 98%    BMI 19.72 kg/m    Subjective:    Patient ID: [name], female    DOB: 22-May-1951, 71 y.o.   MRN: [id number]  HPI: Mary [name] is a 71 y.o. female  Chief Complaint  Patient presents with   Anticoagulation    Follow up   Blurred Vision   Anorexia   Coagulation disorder/PAD:  She is currently on warfarin for PAD.  She has been seen by Dr. Dian Situ vascular surgery and has had multiple right lower extremity revascularizations.  She is currently taking warfarin 5 mg daily on Tuesday, Thursday, Saturday and Sunday.  On Monday, Wednesday and Friday she takes 1/2 pill.  She denies any abnormal bleeding, headaches or personality changes.  Her INR on her last visit was 2.6.  Today it is 1.6.  She says she has not changed her diet or started any different medications. Discussed increasing her warfarin on Wednesday to 5 mg.  She is agreeable to plan. She is going to come back a week after taking new dose to recheck INR.   ER follow-up:  She was seen in the Er at [facility] on 09/29/2021.  She says she had upper abdominal pain, nausea and blurred vision.  They did lab work and a head ct to rule out stroke.  Her ct was negative and lab work was unremarkable.  She was given zofran and fluids and felt slightly better at discharge.  She says they gave her a prescription for zofran which she has not filled yet.  She says she feels better but still gets waves of nausea. She says she gets nervous to eat.  Discussed doing the BRAT diet and taking it really slow.  She says she is going to pick up her zofran prescription.  She denies any abdominal pain and changes in bowel movements.   Blurred vision:  She says that her blurred vision has gotten better unless she looks to the left.  She says she has an appointment at [facility] at 2pm today.  She is going to have them fax over their  notes regarding her blurred vision.  Neuro exam is intact.   Relevant past medical, surgical, family and social history reviewed and updated as indicated. Interim medical history since our last visit reviewed. Allergies and medications reviewed and updated.  Review of Systems  Constitutional: Negative for fever or weight change.  HEENT: positive for blurred vision in left eye when looking to left Respiratory: Negative for cough and shortness of breath.   Cardiovascular: Negative for chest pain or palpitations.  Gastrointestinal: Negative for abdominal pain, no bowel changes.  Musculoskeletal: Negative for gait problem or joint swelling.  Skin: Negative for rash.  Neurological: Negative for dizziness or headache.  No other specific complaints in a complete review of systems (except as listed in HPI above).      Objective:    BP 120/72    Pulse 100    Temp 98.3 F (36.8 C) (Oral)    Resp 16    Ht 5\' 6"  (1.676 m)    Wt 122 lb 3.2 oz (55.4 kg)    SpO2 98%    BMI 19.72 kg/m   Wt Readings from Last 3  Encounters:  10/02/21 122 lb 3.2 oz (55.4 kg)  09/29/21 126 lb (57.2 kg)  08/21/21 128 lb 6.4 oz (58.2 kg)    Physical Exam  Constitutional: Patient appears well-developed and well-nourished. No distress.  HEENT: head atraumatic, normocephalic, pupils equal and reactive to light,  neck supple Cardiovascular: Normal rate, regular rhythm and normal heart sounds.  No murmur heard. No BLE edema. Pulmonary/Chest: Effort normal and breath sounds normal. No respiratory distress. Abdominal: Soft.  There is no tenderness. Psychiatric: Patient has a normal mood and affect. behavior is normal. Judgment and thought content normal.   Results for orders placed or performed in visit on 10/02/21  POCT INR  Result Value Ref Range   INR 1.6 (A) 2.0 - 3.0      Assessment & Plan:   1. Coagulation disorder ([place]) Take 5 mg on Tuesday, Wednesday, Thursday, Saturday and Sunday, take 1/2 pill Monday and  Friday.  - POCT INR  2. PAD (peripheral artery disease) ([place]) Take 5 mg on Tuesday, Wednesday, Thursday, Saturday and Sunday, take 1/2 pill Monday and Friday.  - POCT INR  3. Anticoagulated on Coumadin Take 5 mg on Tuesday, Wednesday, Thursday, Saturday and Sunday, take 1/2 pill Monday and Friday.  - POCT INR  4. Medication monitoring encounter  - POCT INR  5. Decreased appetite -pick up zofran prescription -start BRAT diet, start slow increase as tolerated  6. Blurred vision, left eye -has appointment with [facility] today at 2  Follow up plan: Return in about 6 weeks (around 11/13/2021) for follow up., come back one week after taking additional dose for inr recheck

## 2021-10-09 ENCOUNTER — Ambulatory Visit (INDEPENDENT_AMBULATORY_CARE_PROVIDER_SITE_OTHER): Payer: Medicare HMO | Admitting: Nurse Practitioner

## 2021-10-09 ENCOUNTER — Other Ambulatory Visit: Payer: Self-pay

## 2021-10-09 ENCOUNTER — Encounter: Payer: Self-pay | Admitting: Nurse Practitioner

## 2021-10-09 VITALS — BP 121/78 | HR 80 | Temp 98.2°F | Resp 18 | Wt 122.0 lb

## 2021-10-09 DIAGNOSIS — D689 Coagulation defect, unspecified: Secondary | ICD-10-CM

## 2021-10-09 DIAGNOSIS — I739 Peripheral vascular disease, unspecified: Secondary | ICD-10-CM

## 2021-10-09 DIAGNOSIS — Z7901 Long term (current) use of anticoagulants: Secondary | ICD-10-CM | POA: Diagnosis not present

## 2021-10-09 LAB — POCT INR: INR: 2.3 (ref 2.0–3.0)

## 2021-10-09 NOTE — Progress Notes (Signed)
° °  BP 121/78    Pulse 80    Temp 98.2 F (36.8 C)    Resp 18    Wt 122 lb (55.3 kg)    SpO2 98%    BMI 19.69 kg/m    Subjective:    Patient ID: Mary [name], female    DOB: 11/18/1950, 71 y.o.   MRN: [id number]  HPI: Mary [name] is a 71 y.o. female  Chief Complaint  Patient presents with   Anticoagulation   Coagulation disorder/PAD:  She is currently on warfarin for PAD.  She has been seen by Dr. Dian Situ vascular surgery and has had multiple right lower extremity revascularizations.  She was taking her warfarin 5 mg daily on Tuesday, Thursday, Saturday and Sunday and then taking a 1/2 pill on Monday, Wednesday and Friday. Last visit her INR dropped to 1.6.  We changed her dosing to 5 mg on Tuesday, Wednesday, Thursday, Saturday and Sunday and 1/2 pill on Monday and Friday. Her INR today is 2.6. She says she is feeling much better. She denies any abnormal bleeding, headaches or change in mentation. Will continue on current dose and recheck in four weeks. She is agreeable to plan.    Relevant past medical, surgical, family and social history reviewed and updated as indicated. Interim medical history since our last visit reviewed. Allergies and medications reviewed and updated.  Review of Systems  Constitutional: Negative for fever or weight change.  Respiratory: Negative for cough and shortness of breath.   Cardiovascular: Negative for chest pain or palpitations.  Gastrointestinal: Negative for abdominal pain, no bowel changes.  Musculoskeletal: Negative for gait problem or joint swelling.  Skin: Negative for rash.  Neurological: Negative for dizziness or headache.  No other specific complaints in a complete review of systems (except as listed in HPI above).      Objective:    BP 121/78    Pulse 80    Temp 98.2 F (36.8 C)    Resp 18    Wt 122 lb (55.3 kg)    SpO2 98%    BMI 19.69 kg/m   Wt Readings from Last 3 Encounters:  10/09/21 122 lb (55.3 kg)  10/02/21 122 lb 3.2 oz (55.4  kg)  09/29/21 126 lb (57.2 kg)    Physical Exam  Constitutional: Patient appears well-developed and well-nourished. No distress.  HEENT: head atraumatic, normocephalic, pupils equal and reactive to light, neck supple Cardiovascular: Normal rate, regular rhythm and normal heart sounds.  No murmur heard. No BLE edema. Pulmonary/Chest: Effort normal and breath sounds normal. No respiratory distress. Abdominal: Soft.  There is no tenderness. Psychiatric: Patient has a normal mood and affect. behavior is normal. Judgment and thought content normal.   Results for orders placed or performed in visit on 10/09/21  POCT INR  Result Value Ref Range   INR 2.3 2.0 - 3.0      Assessment & Plan:   1. Anticoagulated on Coumadin -continue current dose - POCT INR  2. PAD (peripheral artery disease) ([place]) -continue current dose - POCT INR  3. Coagulation disorder ([place]) -continue current dose - POCT INR   Follow up plan: Return in about 4 weeks (around 11/06/2021) for follow up.

## 2021-10-30 ENCOUNTER — Telehealth: Payer: Self-pay

## 2021-10-30 NOTE — Progress Notes (Signed)
? ? ?Chronic Care Management ?Pharmacy Assistant  ? ?Name: Mary [name]  MRN: [id number] DOB: 02/22/1951 ? ?Reason for Encounter: Hypertension Disease State ?  ?Recent office visits:  ?10/09/2021 Serafina Royals, FNP (PCP Office Visit) for Coagulation disorder- No medication changes noted, lab order placed, patient to follow-up in 4 weeks. ? ?10/02/2021 Serafina Royals, FNP (PCP Office Visit) for Coagulation disorder- No medication changes noted, lab order placed, patient to follow-up in 6 weeks. ? ?08/21/2021 Serafina Royals, FNP (PCP Office Visit) for Coagulation disorder- No medication changes noted, lab order placed, patient to follow-up in 6 weeks ? ?Recent consult visits:  ?None ID ? ?Hospital visits:  ?Medication Reconciliation was completed by comparing discharge summary, patient?s EMR and Pharmacy list, and upon discussion with patient. ? ?Admitted to the hospital on 09/29/2021 due to Nausea and vomiting. Discharge date was 09/29/2021. Discharged from [facility] Emergency Department.   ? ?New?Medications Started at [facility] Discharge:?? ?-ondansetron 4 MG disintegrating tablet Take 1 tablet (4 mg total) by mouth every 8 (eight) ?hours as needed for nausea or vomiting. ? ?Medication Changes at Hospital Discharge: ?-Changed None ID ? ?Medications Discontinued at Hospital Discharge: ?-Stopped None ID ? ?Medications that remain the same after Hospital Discharge:??  ?-All other medications will remain the same.   ? ?Medications: ?Outpatient Encounter Medications as of 10/30/2021  ?Medication Sig  ? acetaminophen (TYLENOL) 500 MG tablet Take 500 mg by mouth 2 (two) times daily as needed for moderate pain or headache.  ? amLODipine (NORVASC) 5 MG tablet Take 1 tablet (5 mg total) by mouth daily.  ? atorvastatin (LIPITOR) 10 MG tablet Take 1 tablet (10 mg total) by mouth at bedtime.  ? cholecalciferol (VITAMIN D3) 25 MCG (1000 UNIT) tablet Take 2 tablets (2,000 Units total) by mouth daily.  ? ondansetron  (ZOFRAN-ODT) 4 MG disintegrating tablet Take 1 tablet (4 mg total) by mouth every 8 (eight) hours as needed for nausea or vomiting.  ? potassium chloride (KLOR-CON 10) 10 MEQ tablet Take 1 tablet (10 mEq total) by mouth 2 (two) times daily.  ? vitamin E 400 UNIT capsule Take 180 Units by mouth daily.   ? warfarin (COUMADIN) 5 MG tablet Take 5 mg tablet Su, Tues, Wed, Th and Sa and take 1/2 tablet (2.5 mg) Mon, Fri  ? ?No facility-administered encounter medications on file as of 10/30/2021.  ? ?Care Gaps: ?PNA Vaccine ?Zoster Vaccine ?Colonoscopy ?COVID-19 Booster 4 ?Dexa Scan ? ?Star Rating Drugs: ?Atorvastatin 10 mg last filled on 10/07/2021 for a 90-Day supply with [facility] ? ?Reviewed chart prior to disease state call. Spoke with patient regarding BP ? ?Recent Office Vitals: ?BP Readings from Last 3 Encounters:  ?10/09/21 121/78  ?10/02/21 120/72  ?09/29/21 130/78  ? ?Pulse Readings from Last 3 Encounters:  ?10/09/21 80  ?10/02/21 100  ?09/29/21 80  ?  ?Wt Readings from Last 3 Encounters:  ?10/09/21 122 lb (55.3 kg)  ?10/02/21 122 lb 3.2 oz (55.4 kg)  ?09/29/21 126 lb (57.2 kg)  ?  ?Kidney Function ?Lab Results  ?Component Value Date/Time  ? CREATININE 0.68 09/29/2021 02:23 PM  ? CREATININE 0.79 01/09/2021 11:33 AM  ? CREATININE 0.86 07/29/2020 11:57 AM  ? GFRNONAA >60 09/29/2021 02:23 PM  ? GFRNONAA 77 01/09/2021 11:33 AM  ? GFRAA 90 01/09/2021 11:33 AM  ? ? ?BMP Latest Ref Rng & Units 09/29/2021 01/09/2021 07/29/2020  ?Glucose 70 - 99 mg/dL 103(H) 84 54(L)  ?BUN 8 - 23 mg/dL 10 10 7   ?  Creatinine 0.44 - 1.00 mg/dL 0.68 0.79 0.86  ?BUN/Creat Ratio 6 - 22 (calc) - NOT APPLICABLE NOT APPLICABLE  ?Sodium 135 - 145 mmol/L 137 140 141  ?Potassium 3.5 - 5.1 mmol/L 3.6 4.8 3.6  ?Chloride 98 - 111 mmol/L 101 102 103  ?CO2 22 - 32 mmol/L 27 31 32  ?Calcium 8.9 - 10.3 mg/dL 9.1 9.4 9.7  ? ?Current antihypertensive regimen:  ?Amlodipine 5 mg 1 tablet daily ? ?How often are you checking your Blood Pressure? 1-2x per  week ? ?Current home BP readings: 120/78 ? ?What recent interventions/DTPs have been made by any provider to improve Blood Pressure control since last CPP Visit: None ID ? ?Any recent hospitalizations or ED visits since last visit with CPP? Yes ? ?What diet changes have been made to improve Blood Pressure Control?  ?Patient is currently on Warfarin so she just follows that diet  ? ?What exercise is being done to improve your Blood Pressure Control?  ?Patient walks when she can ? ?Adherence Review: ?Is the patient currently on ACE/ARB medication? No ?Does the patient have >5 day gap between last estimated fill dates? No ? ?Patient reports she is doing well. Patient denies any ill symptoms at this time. Patient stated she has an appointment tomorrow with PCP for her PTINR check. Patient is taking all medications as directed. Patient stated she has no concerns or issues at this time. ? ?Patient has a telephone appointment with Junius Argyle, CPP on 12/24/2021 @ 1100 ? ?Lynann Bologna, CPA/CMA ?Clinical Pharmacist Assistant ?Phone: [phone]  ? ? ? ? ? ?

## 2021-11-06 ENCOUNTER — Ambulatory Visit (INDEPENDENT_AMBULATORY_CARE_PROVIDER_SITE_OTHER): Payer: Medicare HMO | Admitting: Nurse Practitioner

## 2021-11-06 ENCOUNTER — Encounter: Payer: Self-pay | Admitting: Nurse Practitioner

## 2021-11-06 ENCOUNTER — Other Ambulatory Visit: Payer: Self-pay

## 2021-11-06 VITALS — BP 120/70 | HR 99 | Temp 98.1°F | Resp 16 | Ht 66.0 in | Wt 121.3 lb

## 2021-11-06 DIAGNOSIS — I739 Peripheral vascular disease, unspecified: Secondary | ICD-10-CM | POA: Diagnosis not present

## 2021-11-06 DIAGNOSIS — Z7901 Long term (current) use of anticoagulants: Secondary | ICD-10-CM | POA: Diagnosis not present

## 2021-11-06 DIAGNOSIS — D689 Coagulation defect, unspecified: Secondary | ICD-10-CM

## 2021-11-06 DIAGNOSIS — D6869 Other thrombophilia: Secondary | ICD-10-CM | POA: Diagnosis not present

## 2021-11-06 LAB — POCT INR: INR: 1.8 — AB (ref 2.0–3.0)

## 2021-11-06 NOTE — Progress Notes (Signed)
? ?BP 120/70   Pulse 99   Temp 98.1 ?F (36.7 ?C) (Oral)   Resp 16   Ht '5\' 6"'$  (1.676 m)   Wt 121 lb 4.8 oz (55 kg)   SpO2 98%   BMI 19.58 kg/m?   ? ?Subjective:  ? ? Patient ID: Mary [name], female    DOB: 1951-03-05, 71 y.o.   MRN: [id number] ? ?HPI: ?Mary [name] is a 71 y.o. female ? ?Chief Complaint  ?Patient presents with  ? Anticoagulation  ? ?Coagulation disorder/PAD:  She is currently on warfarin for PAD.  She has been seen by Dr. Dian Situ vascular surgery and has had multiple right lower extremity revascularizations.  She was taking her warfarin 5 mg daily on Tuesday, Thursday, Saturday and Sunday and then taking a 1/2 pill on Monday, Wednesday and Friday. Her INR dropped to 1.6, on 10/02/2021. We changed her dosing to 5 mg on Tuesday, Wednesday, Thursday, Saturday and Sunday and 1/2 pill on Monday and Friday. Her INR at her last visit (10/09/2021) was 2.6. We continued her on the current dose. Her INR today is 1.8. She denies any abnormal bleeding, headaches, changes in mentation.  Will increase warfarin 5 mg daily on Monday, Tuesday, Wednesday, Thursday, Saturday and Sunday. Friday take 1/2 pill. Will recheck INR in 2 weeks, if at goal can follow up in 6 weeks.  ? ?Review of Systems ? ?Constitutional: Negative for fever or weight change.  ?Respiratory: Negative for cough and shortness of breath.   ?Cardiovascular: Negative for chest pain or palpitations.  ?Gastrointestinal: Negative for abdominal pain, no bowel changes.  ?Musculoskeletal: Negative for gait problem or joint swelling.  ?Skin: Negative for rash.  ?Neurological: Negative for dizziness or headache.  ?No other specific complaints in a complete review of systems (except as listed in HPI above).  ? ?   ?Objective:  ?  ?BP 120/70   Pulse 99   Temp 98.1 ?F (36.7 ?C) (Oral)   Resp 16   Ht '5\' 6"'$  (1.676 m)   Wt 121 lb 4.8 oz (55 kg)   SpO2 98%   BMI 19.58 kg/m?   ?Wt Readings from Last 3 Encounters:  ?11/06/21 121 lb 4.8 oz (55 kg)  ?10/09/21  122 lb (55.3 kg)  ?10/02/21 122 lb 3.2 oz (55.4 kg)  ?  ?Physical Exam ? ?Constitutional: Patient appears well-developed and well-nourished.  No distress.  ?HEENT: head atraumatic, normocephalic, pupils equal and reactive to light, neck supple ?Cardiovascular: Normal rate, regular rhythm and normal heart sounds.  No murmur heard. No BLE edema. ?Pulmonary/Chest: Effort normal and breath sounds normal. No respiratory distress. ?Abdominal: Soft.  There is no tenderness. ?Psychiatric: Patient has a normal mood and affect. behavior is normal. Judgment and thought content normal.  ? ?Results for orders placed or performed in visit on 11/06/21  ?POCT INR  ?Result Value Ref Range  ? INR 1.8 (A) 2.0 - 3.0  ? ?   ?Assessment & Plan:  ? ?1. PAD (peripheral artery disease) ([facility]) ?- POCT INR  ?-changed dose of warfarin 5 mg on Monday, Tuesday, Wednesday, Thursday, Saturday and Sunday.  1/2 pill on Friday ?-recheck INR in two weeks ? ?2. Coagulation disorder ([place]) ?- POCT INR  ?-changed dose of warfarin 5 mg on Monday, Tuesday, Wednesday, Thursday, Saturday and Sunday.  1/2 pill on Friday ?-recheck INR in two weeks ?3. Anticoagulated on Coumadin ? ?- POCT INR  ?-changed dose of warfarin 5 mg on Monday, Tuesday, Wednesday, Thursday, Saturday  and Sunday.  1/2 pill on Friday ?-recheck INR in two weeks ?Follow up plan: ?Return in about 2 weeks (around 11/20/2021) for for INR only, nurse visit. ? ? ? ? ? ?

## 2021-11-20 ENCOUNTER — Ambulatory Visit: Payer: Medicare HMO

## 2021-11-21 ENCOUNTER — Ambulatory Visit (INDEPENDENT_AMBULATORY_CARE_PROVIDER_SITE_OTHER): Payer: Medicare HMO | Admitting: Family Medicine

## 2021-11-21 ENCOUNTER — Encounter: Payer: Self-pay | Admitting: Family Medicine

## 2021-11-21 ENCOUNTER — Other Ambulatory Visit: Payer: Self-pay

## 2021-11-21 DIAGNOSIS — I739 Peripheral vascular disease, unspecified: Secondary | ICD-10-CM | POA: Diagnosis not present

## 2021-11-21 DIAGNOSIS — Z7901 Long term (current) use of anticoagulants: Secondary | ICD-10-CM

## 2021-11-21 LAB — POCT INR: INR: 3 (ref 2.0–3.0)

## 2021-11-21 MED ORDER — WARFARIN SODIUM 5 MG PO TABS: ORAL_TABLET | ORAL | 1 refills | Status: DC

## 2021-11-21 NOTE — Progress Notes (Addendum)
? ? ?Patient ID: [name], female    DOB: 09-May-1951, 71 y.o.   MRN: [id number] ? ?PCP: Bo Merino, FNP ? ?Chief Complaint  ?Patient presents with  ? PT INR check  ? ?I connected with  [name] on 11/21/21 at  2:40 PM EDT by telephone and verified that I am speaking with the correct person using two identifiers. ?  ?I discussed the limitations, risks, security and privacy concerns of performing an evaluation and management service by telephone and the availability of in person appointments. Staff also discussed with the patient that there may be a patient responsible charge related to this service.  Patient verbalized understanding and agreed to proceed with encounter. ?Patient Location: home ?Provider Location: [facility] ?Additional Individuals present: none ? ? ?Subjective:  ? ?SAMAYAH NOVINGER is a 71 y.o. female, presents to clinic with CC of the following: ? ?HPI  ?PT/INR recheck after last was too low, dose was increased to 5 mg 6 d a week instead of 5, now only taking 1/2 tab, 2.5 mg dose on Fridays ? ?Results for orders placed or performed in visit on 11/21/21  ?POCT INR  ?Result Value Ref Range  ? INR 3.0 2.0 - 3.0  ? ? ? ? ?Patient Active Problem List  ? Diagnosis Date Noted  ? Anticoagulated on Coumadin 02/05/2020  ? History of gout 06/23/2019  ? Coronary artery disease 02/16/2019  ? Benign neoplasm of cecum   ? Benign neoplasm of transverse colon   ? Polyp of sigmoid colon   ? External hemorrhoids   ? Hip arthritis 10/27/2017  ? Breast lump on left side at 8 o'clock position 07/08/2017  ? Breast lump on right side at 7 o'clock position 11/14/2015  ? Encounter for monitoring Coumadin therapy 02/27/2015  ? PAD (peripheral artery disease) ([place])   ? Primary hypertension   ? Mixed hyperlipidemia   ? Tobacco abuse   ? Hyperuricemia   ? ? ? ? ?Current Outpatient Medications:  ?  acetaminophen (TYLENOL) 500 MG tablet, Take 500 mg by mouth 2 (two) times daily as needed for moderate pain or headache.,  Disp: , Rfl:  ?  amLODipine (NORVASC) 5 MG tablet, Take 1 tablet (5 mg total) by mouth daily., Disp: 90 tablet, Rfl: 3 ?  atorvastatin (LIPITOR) 10 MG tablet, Take 1 tablet (10 mg total) by mouth at bedtime., Disp: 90 tablet, Rfl: 3 ?  cholecalciferol (VITAMIN D3) 25 MCG (1000 UNIT) tablet, Take 2 tablets (2,000 Units total) by mouth daily., Disp: , Rfl:  ?  ondansetron (ZOFRAN-ODT) 4 MG disintegrating tablet, Take 1 tablet (4 mg total) by mouth every 8 (eight) hours as needed for nausea or vomiting. (Patient not taking: Reported on 11/06/2021), Disp: 12 tablet, Rfl: 0 ?  potassium chloride (KLOR-CON 10) 10 MEQ tablet, Take 1 tablet (10 mEq total) by mouth 2 (two) times daily. (Patient not taking: Reported on 11/06/2021), Disp: 8 tablet, Rfl: 0 ?  vitamin E 400 UNIT capsule, Take 180 Units by mouth daily. , Disp: , Rfl:  ?  warfarin (COUMADIN) 5 MG tablet, Take 5 mg tablet Su, Tues, Wed, Th and Sa and take 1/2 tablet (2.5 mg) Mon, Fri, Disp: 234 tablet, Rfl: 1 ? ? ?No Known Allergies ? ? ?Social History  ? ?Tobacco Use  ? Smoking status: Every Day  ?  Packs/day: 0.50  ?  Years: 40.00  ?  Pack years: 20.00  ?  Types: Cigarettes  ? Smokeless  tobacco: Never  ?Vaping Use  ? Vaping Use: Never used  ?Substance Use Topics  ? Alcohol use: No  ? Drug use: No  ?  ? ? ?Chart Review Today: ?I personally reviewed active problem list, medication list, allergies, family history, social history, health maintenance, notes from last encounter, lab results, imaging with the patient/caregiver today. ? ? ?Review of Systems ?10 Systems reviewed and are negative for acute change except as noted in the HPI. ? ?   ?Objective:  ? ?There were no vitals filed for this visit.  ?There is no height or weight on file to calculate BMI. ? ?Physical Exam ?Vitals and nursing note reviewed.  ?Neurological:  ?   Mental Status: She is alert.  ? Normal phonation, NAD ? ?Results for orders placed or performed in visit on 11/21/21  ?POCT INR  ?Result Value Ref  Range  ? INR 3.0 2.0 - 3.0  ? ? ?   ?Assessment & Plan:  ? ?1. Anticoagulated on Coumadin ?Pt-INR at goal, continue current dosing, recheck in 1 month ?- POCT INR ?- warfarin (COUMADIN) 5 MG tablet; Take 1 tab (5 mg) PO once daily 6 days of the week and take 1.2 tab (2.5 mg) tab po once a week on fridays  Dispense: 234 tablet; Refill: 1 ? ?2. Anticoagulated on Coumadin ? ?- POCT INR ?- warfarin (COUMADIN) 5 MG tablet; Take 1 tab (5 mg) PO once daily 6 days of the week and take 1.2 tab (2.5 mg) tab po once a week on fridays  Dispense: 234 tablet; Refill: 1 ? ?3. PAD (peripheral artery disease) ([place]) ?Disease and sx stable, PT/INR at goal, continue statin  ?- warfarin (COUMADIN) 5 MG tablet; Take 1 tab (5 mg) PO once daily 6 days of the week and take 1.2 tab (2.5 mg) tab po once a week on fridays  Dispense: 234 tablet; Refill: 1 ? ?I provided 12 minutes of non-face-to-face time during this encounter. ? ? ? ?Delsa Grana, PA-C ?11/21/21 1:36 PM ? ?

## 2021-11-21 NOTE — Addendum Note (Signed)
Addended by: Delsa Grana on: 11/21/2021 01:44 PM ? ? Modules accepted: Orders, Level of Service ? ?

## 2021-12-22 ENCOUNTER — Other Ambulatory Visit: Payer: Self-pay | Admitting: Unknown Physician Specialty

## 2021-12-22 DIAGNOSIS — E782 Mixed hyperlipidemia: Secondary | ICD-10-CM

## 2021-12-22 DIAGNOSIS — I2510 Atherosclerotic heart disease of native coronary artery without angina pectoris: Secondary | ICD-10-CM

## 2021-12-22 DIAGNOSIS — I739 Peripheral vascular disease, unspecified: Secondary | ICD-10-CM

## 2021-12-23 ENCOUNTER — Telehealth: Payer: Self-pay

## 2021-12-23 NOTE — Progress Notes (Signed)
? ? ?  Chronic Care Management ?Pharmacy Assistant  ? ?Name: Mary [name]  MRN: [id number] DOB: 1950/11/03 ? ?Patient called to be reminded of her telephone appointment with Junius Argyle, CPP on 12/24/2021 @ 1100 ? ?Patient aware of appointment date, time, and type of appointment (either telephone or in person). Patient aware to have/bring all medications, supplements, blood pressure and/or blood sugar logs to visit. ? ?Star Rating Drug: ?Atorvastatin 10 mg last filled on 10/07/2021 for a 90-Day supply with [facility] ? ?Any gaps in medications fill history? No ? ?Care Gaps: ?PNA Vaccine ?Zoster Vaccine ?Colonoscopy ?COVID-19 Vaccine Booster 4 ?Dexa Scan ? ? ? ?Lynann Bologna, CPA/CMA ?Clinical Pharmacist Assistant ?Phone: [phone]  ? ?

## 2021-12-23 NOTE — Telephone Encounter (Signed)
Requested medication (s) are due for refill today: Yes ? ?Requested medication (s) are on the active medication list: Yes ? ?Last refill:  02/18/21 ? ?Future visit scheduled: Yes ? ?Notes to clinic:  Pharmacy requests 1 year supply. ? ? ? ?Requested Prescriptions  ?Pending Prescriptions Disp Refills  ? atorvastatin (LIPITOR) 10 MG tablet [Pharmacy Med Name: ATORVASTATIN CALCIUM 10 MG Tablet] 90 tablet 3  ?  Sig: TAKE 1 TABLET AT BEDTIME  ?  ? Cardiovascular:  Antilipid - Statins Failed - 12/22/2021 12:04 PM  ?  ?  Failed - Lipid Panel in normal range within the last 12 months  ?  Cholesterol, Total  ?Date Value Ref Range Status  ?10/17/2015 145 100 - 199 mg/dL Final  ? ?Cholesterol  ?Date Value Ref Range Status  ?01/09/2021 122 <200 mg/dL Final  ? ?LDL Cholesterol (Calc)  ?Date Value Ref Range Status  ?01/09/2021 57 mg/dL (calc) Final  ?  Comment:  ?  Reference range: <100 ?Marland Kitchen ?Desirable range <100 mg/dL for primary prevention;   ?<70 mg/dL for patients with CHD or diabetic patients  ?with > or = 2 CHD risk factors. ?. ?LDL-C is now calculated using the Martin-Hopkins  ?calculation, which is a validated novel method providing  ?better accuracy than the Friedewald equation in the  ?estimation of LDL-C.  ?Cresenciano Genre et al. Annamaria Helling. 6811;572(62): 2061-2068  ?(http://education.[employer].com/faq/FAQ164) ?  ? ?HDL  ?Date Value Ref Range Status  ?01/09/2021 45 (L) > OR = 50 mg/dL Final  ?10/17/2015 43 >39 mg/dL Final  ? ?Triglycerides  ?Date Value Ref Range Status  ?01/09/2021 121 <150 mg/dL Final  ? ?  ?  ?  Passed - Patient is not pregnant  ?  ?  Passed - Valid encounter within last 12 months  ?  Recent Outpatient Visits   ? ?      ? 1 month ago Anticoagulated on Coumadin  ? [facility] Delsa Grana, PA-C  ? 1 month ago PAD (peripheral artery disease) ([place])  ? [place], FNP  ? 2 months ago Anticoagulated on Coumadin  ? [place], FNP  ? 2 months ago Coagulation disorder [facility])  ? [place], FNP  ? 4 months ago Coagulation disorder [facility])  ? [facility] Bo Merino, FNP  ? ?  ?  ?Future Appointments   ? ?        ? In 1 week Reece Packer, Myna Hidalgo, [facility], [place]  ? In 2 months  [place]  ? ?  ? ? ?  ?  ?  ? ?

## 2021-12-24 ENCOUNTER — Ambulatory Visit (INDEPENDENT_AMBULATORY_CARE_PROVIDER_SITE_OTHER): Payer: Medicare PPO

## 2021-12-24 DIAGNOSIS — I10 Essential (primary) hypertension: Secondary | ICD-10-CM

## 2021-12-24 DIAGNOSIS — Z720 Tobacco use: Secondary | ICD-10-CM

## 2021-12-24 NOTE — Progress Notes (Signed)
? ?Chronic Care Management ?Pharmacy Note ? ?12/24/2021 ?Name:  Mary [name] MRN:  [id number] DOB:  04/24/1951 ? ?Summary: ?Patient presents for CCM follow-up. Discussed again the need to quit smoking.  ? ?Recommendations/Changes made from today's visit: ?Continue Current Medications ? ?Plan: ?CPP follow-up in 3 months  ? ? ?Subjective: ?Mary [name] is an 70 y.o. year old female who is a primary patient of Pender, Julie F, FNP.  The CCM team was consulted for assistance with disease management and care coordination needs.   ? ?Engaged with patient by telephone for follow up visit in response to provider referral for pharmacy case management and/or care coordination services.  ? ?Consent to Services:  ?The patient was given information about Chronic Care Management services, agreed to services, and gave verbal consent prior to initiation of services.  Please see initial visit note for detailed documentation.  ? ?Patient Care Team: ?Pender, Julie F, FNP as PCP - General (Nurse Practitioner) ?Dew, Jason S, MD as Referring Physician (Vascular Surgery) ?Fleury, Alexandre A, RPH (Pharmacist) ?Tahiliani, Varnita B, MD (Inactive) as Consulting Physician (Gastroenterology) ? ?Recent office visits: ?11/06/21: Patient presented to Julie Pender, FNP for anti-coagulation. ?02/20/21: Patient presented to Kasey Uthus, LPN for AWV.  ? ?Recent consult visits: ?None in previous 6 months ? ?Hospital visits: ?None in previous 6 months ? ? ?Objective: ? ?Lab Results  ?Component Value Date  ? CREATININE 0.68 09/29/2021  ? BUN 10 09/29/2021  ? GFRNONAA >60 09/29/2021  ? GFRAA 90 01/09/2021  ? NA 137 09/29/2021  ? K 3.6 09/29/2021  ? CALCIUM 9.1 09/29/2021  ? CO2 27 09/29/2021  ? GLUCOSE 103 (H) 09/29/2021  ? ? ?Lab Results  ?Component Value Date/Time  ? HGBA1C 6.2 (H) 10/13/2017 01:32 PM  ?  ?Last diabetic Eye exam: No results found for: HMDIABEYEEXA  ?Last diabetic Foot exam: No results found for: HMDIABFOOTEX  ? ?Lab Results  ?Component  Value Date  ? CHOL 122 01/09/2021  ? HDL 45 (L) 01/09/2021  ? LDLCALC 57 01/09/2021  ? TRIG 121 01/09/2021  ? CHOLHDL 2.7 01/09/2021  ? ? ? ?  Latest Ref Rng & Units 09/29/2021  ?  2:23 PM 01/09/2021  ? 11:33 AM 07/29/2020  ? 11:57 AM  ?Hepatic Function  ?Total Protein 6.5 - 8.1 g/dL 7.7   7.0   7.3    ?Albumin 3.5 - 5.0 g/dL 4.2      ?AST 15 - 41 U/L 22   19   17    ?ALT 0 - 44 U/L 14   10   10    ?Alk Phosphatase 38 - 126 U/L 82      ?Total Bilirubin 0.3 - 1.2 mg/dL 0.5   0.3   0.3    ?Bilirubin, Direct 0.0 - 0.2 mg/dL 0.1      ? ? ?Lab Results  ?Component Value Date/Time  ? TSH 2.13 02/22/2018 09:03 AM  ? FREET4 1.0 02/22/2018 09:03 AM  ? ? ? ?  Latest Ref Rng & Units 09/29/2021  ?  2:23 PM 01/09/2021  ? 11:33 AM 07/29/2020  ? 11:57 AM  ?CBC  ?WBC 4.0 - 10.5 K/uL 5.0   6.3   6.2    ?Hemoglobin 12.0 - 15.0 g/dL 13.7   13.5   14.0    ?Hematocrit 36.0 - 46.0 % 41.7   39.9   40.4    ?Platelets 150 - 400 K/uL 292   298   284    ? ? ?  Lab Results  ?Component Value Date/Time  ? VD25OH 25 (L) 01/09/2021 11:33 AM  ? ? ?Clinical ASCVD: No  ?The ASCVD Risk score (Arnett DK, et al., 2019) failed to calculate for the following reasons: ?  The valid total cholesterol range is 130 to 320 mg/dL   ? ? ?  11/06/2021  ? 10:40 AM 10/09/2021  ?  2:42 PM 10/02/2021  ? 10:40 AM  ?Depression screen PHQ 2/9  ?Decreased Interest 0 0 0  ?Down, Depressed, Hopeless 0 0 0  ?PHQ - 2 Score 0 0 0  ?  ?Social History  ? ?Tobacco Use  ?Smoking Status Every Day  ? Packs/day: 0.50  ? Years: 40.00  ? Pack years: 20.00  ? Types: Cigarettes  ?Smokeless Tobacco Never  ? ?BP Readings from Last 3 Encounters:  ?11/06/21 120/70  ?10/09/21 121/78  ?10/02/21 120/72  ? ?Pulse Readings from Last 3 Encounters:  ?11/06/21 99  ?10/09/21 80  ?10/02/21 100  ? ?Wt Readings from Last 3 Encounters:  ?11/06/21 121 lb 4.8 oz (55 kg)  ?10/09/21 122 lb (55.3 kg)  ?10/02/21 122 lb 3.2 oz (55.4 kg)  ? ?BMI Readings from Last 3 Encounters:  ?11/06/21 19.58 kg/m?  ?10/09/21 19.69 kg/m?   ?10/02/21 19.72 kg/m?  ? ? ?Assessment/Interventions: Review of patient past medical history, allergies, medications, health status, including review of consultants reports, laboratory and other test data, was performed as part of comprehensive evaluation and provision of chronic care management services.  ? ?SDOH:  (Social Determinants of Health) assessments and interventions performed: Yes ? ?SDOH Screenings  ? ?Alcohol Screen: Low Risk   ? Last Alcohol Screening Score (AUDIT): 0  ?Depression (PHQ2-9): Low Risk   ? PHQ-2 Score: 0  ?Financial Resource Strain: Low Risk   ? Difficulty of Paying Living Expenses: Not hard at all  ?Food Insecurity: No Food Insecurity  ? Worried About Running Out of Food in the Last Year: Never true  ? Ran Out of Food in the Last Year: Never true  ?Housing: Low Risk   ? Last Housing Risk Score: 0  ?Physical Activity: Insufficiently Active  ? Days of Exercise per Week: 7 days  ? Minutes of Exercise per Session: 20 min  ?Social Connections: Moderately Isolated  ? Frequency of Communication with Friends and Family: More than three times a week  ? Frequency of Social Gatherings with Friends and Family: Once a week  ? Attends Religious Services: Never  ? Active Member of Clubs or Organizations: No  ? Attends Club or Organization Meetings: Never  ? Marital Status: Married  ?Stress: No Stress Concern Present  ? Feeling of Stress : Not at all  ?Tobacco Use: High Risk  ? Smoking Tobacco Use: Every Day  ? Smokeless Tobacco Use: Never  ? Passive Exposure: Not on file  ?Transportation Needs: No Transportation Needs  ? Lack of Transportation (Medical): No  ? Lack of Transportation (Non-Medical): No  ? ? ?CCM Care Plan ? ?No Known Allergies ? ?Medications Reviewed Today   ? ? Reviewed by Tapia, Leisa, PA-C (Physician Assistant Certified) on 11/21/21 at 1341  Med List Status: <None>  ? ?Medication Order Taking? Sig Documenting Provider Last Dose Status Informant  ?acetaminophen (TYLENOL) 500 MG tablet  [id number] No Take 500 mg by mouth 2 (two) times daily as needed for moderate pain or headache. [provider] Taking Active Self  ?amLODipine (NORVASC) 5 MG tablet [id number] No Take 1 tablet (5 mg total) by mouth daily. Wicker, Cheryl,   NP Taking Active   ?atorvastatin (LIPITOR) 10 MG tablet [id number] No Take 1 tablet (10 mg total) by mouth at bedtime. Wicker, Cheryl, NP Taking Active   ?cholecalciferol (VITAMIN D3) 25 MCG (1000 UNIT) tablet [id number] No Take 2 tablets (2,000 Units total) by mouth daily. Tapia, Leisa, PA-C Taking Active   ?ondansetron (ZOFRAN-ODT) 4 MG disintegrating tablet [id number] No Take 1 tablet (4 mg total) by mouth every 8 (eight) hours as needed for nausea or vomiting.  ?Patient not taking: Reported on 11/06/2021  ? Jessup, Charles, MD Not Taking Active   ?potassium chloride (KLOR-CON 10) 10 MEQ tablet [id number] No Take 1 tablet (10 mEq total) by mouth 2 (two) times daily.  ?Patient not taking: Reported on 11/06/2021  ? Lada, Melinda P, MD Not Taking Active   ?vitamin E 400 UNIT capsule [id number] No Take 180 Units by mouth daily.  [provider] Taking Active Self  ?         ?Med Note (UTHUS, KASEY D   Tue Feb 20, 2020 10:19 AM)    ?warfarin (COUMADIN) 5 MG tablet [id number]  Take 1 tab (5 mg) PO once daily 6 days of the week and take 1.2 tab (2.5 mg) tab po once a week on fridays Tapia, Leisa, PA-C  Active   ? ?  ?  ? ?  ? ? ?Patient Active Problem List  ? Diagnosis Date Noted  ? Anticoagulated on Coumadin 02/05/2020  ? History of gout 06/23/2019  ? Coronary artery disease 02/16/2019  ? Benign neoplasm of cecum   ? Benign neoplasm of transverse colon   ? Polyp of sigmoid colon   ? External hemorrhoids   ? Hip arthritis 10/27/2017  ? Breast lump on left side at 8 o'clock position 07/08/2017  ? Breast lump on right side at 7 o'clock position 11/14/2015  ? Encounter for monitoring Coumadin therapy 02/27/2015  ? PAD (peripheral artery disease) ([facility])   ? Primary hypertension   ?  Mixed hyperlipidemia   ? Tobacco abuse   ? Hyperuricemia   ? ? ?Immunization History  ?Administered Date(s) Administered  ? Moderna Sars-Covid-2 Vaccination 10/28/2019, 11/25/2019  ? PFIZER(Purple Top)SAR

## 2021-12-28 DIAGNOSIS — I10 Essential (primary) hypertension: Secondary | ICD-10-CM | POA: Diagnosis not present

## 2021-12-30 ENCOUNTER — Telehealth: Payer: Self-pay

## 2021-12-30 NOTE — Progress Notes (Signed)
? ? ?Chronic Care Management ?Pharmacy Assistant  ? ?Name: Mary [name]  MRN: [id number] DOB: Aug 19, 1951 ? ?Reason for Encounter: Hypertension Disease State Call ?  ?Recent office visits:  ?None ID ? ?Recent consult visits:  ?None ID ? ?Hospital visits:  ?None in previous 6 months ? ?Medications: ?Outpatient Encounter Medications as of 12/30/2021  ?Medication Sig  ? acetaminophen (TYLENOL) 500 MG tablet Take 500 mg by mouth 2 (two) times daily as needed for moderate pain or headache.  ? amLODipine (NORVASC) 5 MG tablet Take 1 tablet (5 mg total) by mouth daily.  ? atorvastatin (LIPITOR) 10 MG tablet TAKE 1 TABLET AT BEDTIME  ? cholecalciferol (VITAMIN D3) 25 MCG (1000 UNIT) tablet Take 2 tablets (2,000 Units total) by mouth daily.  ? ondansetron (ZOFRAN-ODT) 4 MG disintegrating tablet Take 1 tablet (4 mg total) by mouth every 8 (eight) hours as needed for nausea or vomiting. (Patient not taking: Reported on 11/06/2021)  ? potassium chloride (KLOR-CON 10) 10 MEQ tablet Take 1 tablet (10 mEq total) by mouth 2 (two) times daily. (Patient not taking: Reported on 11/06/2021)  ? vitamin E 400 UNIT capsule Take 180 Units by mouth daily.   ? warfarin (COUMADIN) 5 MG tablet Take 1 tab (5 mg) PO once daily 6 days of the week and take 1.2 tab (2.5 mg) tab po once a week on fridays  ? ?No facility-administered encounter medications on file as of 12/30/2021.  ? ?Care Gaps: ?PNA Vaccine ?Zoster Vaccine ?Colonoscopy ?COVID-19 Vaccine Booster 4 ?Mammogram ? ?Star Rating Drugs: ?Atorvastatin 10 mg last filled on 10/07/2021 for a 90-Day supply with [facility] ? ?Reviewed chart prior to disease state call. Spoke with patient regarding BP ? ?Recent Office Vitals: ?BP Readings from Last 3 Encounters:  ?11/06/21 120/70  ?10/09/21 121/78  ?10/02/21 120/72  ? ?Pulse Readings from Last 3 Encounters:  ?11/06/21 99  ?10/09/21 80  ?10/02/21 100  ?  ?Wt Readings from Last 3 Encounters:  ?11/06/21 121 lb 4.8 oz (55 kg)  ?10/09/21 122 lb (55.3 kg)   ?10/02/21 122 lb 3.2 oz (55.4 kg)  ?  ?Kidney Function ?Lab Results  ?Component Value Date/Time  ? CREATININE 0.68 09/29/2021 02:23 PM  ? CREATININE 0.79 01/09/2021 11:33 AM  ? CREATININE 0.86 07/29/2020 11:57 AM  ? GFRNONAA >60 09/29/2021 02:23 PM  ? GFRNONAA 77 01/09/2021 11:33 AM  ? GFRAA 90 01/09/2021 11:33 AM  ? ? ?  Latest Ref Rng & Units 09/29/2021  ?  2:23 PM 01/09/2021  ? 11:33 AM 07/29/2020  ? 11:57 AM  ?BMP  ?Glucose 70 - 99 mg/dL 103   84   54    ?BUN 8 - 23 mg/dL '10   10   7    '$ ?Creatinine 0.44 - 1.00 mg/dL 0.68   0.79   0.86    ?BUN/Creat Ratio 6 - 22 (calc)  NOT APPLICABLE   NOT APPLICABLE    ?Sodium 135 - 145 mmol/L 137   140   141    ?Potassium 3.5 - 5.1 mmol/L 3.6   4.8   3.6    ?Chloride 98 - 111 mmol/L 101   102   103    ?CO2 22 - 32 mmol/L 27   31   32    ?Calcium 8.9 - 10.3 mg/dL 9.1   9.4   9.7    ? ?Current antihypertensive regimen:  ?Atorvastatin ? ?How often are you checking your Blood Pressure? 3-5x per week ? ?Current home  BP readings: 128/70 ? ?What recent interventions/DTPs have been made by any provider to improve Blood Pressure control since last CPP Visit: None ID ? ?Any recent hospitalizations or ED visits since last visit with CPP? No ? ?What diet changes have been made to improve Blood Pressure Control?  ?Patient stated nothing has changed since she spoke with CPP a few days ago ? ?What exercise is being done to improve your Blood Pressure Control?  ?Patient walks when she can ? ?Adherence Review: ?Is the patient currently on ACE/ARB medication? Yes ?Does the patient have >5 day gap between last estimated fill dates? No ? ? ?Patient reports she is doing well. Patient spoke with CPP a few days ago, and reports nothing has changed. Patient advises she has a PCP appointment tomorrow. Patient has no concerns or issues at this time. ? ?Patient has telephone appointment with Junius Argyle, CPP on 05/06/2022 @ 1100 ? ?Lynann Bologna, CPA/CMA ?Clinical Pharmacist Assistant ?Phone:  [phone]  ? ? ? ? ? ?

## 2022-01-01 ENCOUNTER — Other Ambulatory Visit: Payer: Self-pay

## 2022-01-01 ENCOUNTER — Encounter: Payer: Self-pay | Admitting: Nurse Practitioner

## 2022-01-01 ENCOUNTER — Ambulatory Visit (INDEPENDENT_AMBULATORY_CARE_PROVIDER_SITE_OTHER): Payer: Medicare PPO | Admitting: Nurse Practitioner

## 2022-01-01 VITALS — BP 138/84 | HR 93 | Temp 98.2°F | Resp 18 | Ht 66.0 in | Wt 120.2 lb

## 2022-01-01 DIAGNOSIS — I739 Peripheral vascular disease, unspecified: Secondary | ICD-10-CM

## 2022-01-01 DIAGNOSIS — Z7901 Long term (current) use of anticoagulants: Secondary | ICD-10-CM | POA: Diagnosis not present

## 2022-01-01 DIAGNOSIS — D689 Coagulation defect, unspecified: Secondary | ICD-10-CM

## 2022-01-01 LAB — POCT INR: INR: 3 (ref 2.0–3.0)

## 2022-01-01 NOTE — Assessment & Plan Note (Signed)
Continue taking coumadin 5 mg daily, except for Friday take 2.5 mg.  Patient is at goal.   ?

## 2022-01-01 NOTE — Progress Notes (Signed)
? ?  BP 138/84   Pulse 93   Temp 98.2 ?F (36.8 ?C) (Oral)   Resp 18   Ht '5\' 6"'$  (1.676 m)   Wt 120 lb 3.2 oz (54.5 kg)   SpO2 98%   BMI 19.40 kg/m?   ? ?Subjective:  ? ? Patient ID: Mary [name], female    DOB: 09-15-1950, 71 y.o.   MRN: [id number] ? ?HPI: ?Mary [name] is a 71 y.o. female ? ?Chief Complaint  ?Patient presents with  ? Anticoagulation  ? ?PAD/elation disorder/anticoagulation therapy:She is currently on warfarin for PAD.  She has been seen by Dr. Dian Situ vascular surgery and has had multiple right lower extremity revascularizations.  11/21/2021 her INR was 3.0.  She is here today to recheck her INR.  She is currently taking Coumadin 5 mg every day except for Friday she takes 2.5 mg.  Her INR today is 3.0. she denies any abnormal bleeding, headaches or changes in mentation.  We will continue with current treatment plan. ? ?Relevant past medical, surgical, family and social history reviewed and updated as indicated. Interim medical history since our last visit reviewed. ?Allergies and medications reviewed and updated. ? ?Review of Systems ? ?Per HPI unless specifically indicated above ? ?   ?Objective:  ?  ?BP 138/84   Pulse 93   Temp 98.2 ?F (36.8 ?C) (Oral)   Resp 18   Ht '5\' 6"'$  (1.676 m)   Wt 120 lb 3.2 oz (54.5 kg)   SpO2 98%   BMI 19.40 kg/m?   ?Wt Readings from Last 3 Encounters:  ?01/01/22 120 lb 3.2 oz (54.5 kg)  ?11/06/21 121 lb 4.8 oz (55 kg)  ?10/09/21 122 lb (55.3 kg)  ?  ?Physical Exam ?Constitutional: Patient appears well-developed and well-nourished. No distress.  ?HEENT: head atraumatic, normocephalic, pupils equal and reactive to light, neck supple ?Cardiovascular: Normal rate, regular rhythm and normal heart sounds.  No murmur heard. No BLE edema. ?Pulmonary/Chest: Effort normal and breath sounds normal. No respiratory distress. ?Abdominal: Soft.  There is no tenderness. ?Psychiatric: Patient has a normal mood and affect. behavior is normal. Judgment and thought content normal.   ?Results for orders placed or performed in visit on 01/01/22  ?POCT INR  ?Result Value Ref Range  ? INR 3.0 2.0 - 3.0  ? ?   ?Assessment & Plan:  ? ?Problem List Items Addressed This Visit   ? ?  ? Cardiovascular and Mediastinum  ? PAD (peripheral artery disease) ([facility]) (Chronic)  ?  Continue taking coumadin 5 mg daily, except for Friday take 2.5 mg.  Patient is at goal.   ? ?  ?  ? Relevant Orders  ? POCT INR (Completed)  ?  ? Hematopoietic and Hemostatic  ? Coagulation disorder ([place])  ?  Continue taking coumadin 5 mg daily, except for Friday take 2.5 mg.  Patient is at goal.   ? ?  ?  ? Relevant Orders  ? POCT INR (Completed)  ?  ? Other  ? Anticoagulated on Coumadin - Primary  ?  Continue taking coumadin 5 mg daily, except for Friday take 2.5 mg.  Patient is at goal.   ? ?  ?  ? Relevant Orders  ? POCT INR (Completed)  ?  ? ?Follow up plan: ?Return in about 6 weeks (around 02/12/2022) for follow up. ? ? ? ? ? ?

## 2022-02-04 ENCOUNTER — Ambulatory Visit (INDEPENDENT_AMBULATORY_CARE_PROVIDER_SITE_OTHER): Payer: Medicare PPO | Admitting: Nurse Practitioner

## 2022-02-04 ENCOUNTER — Other Ambulatory Visit: Payer: Self-pay

## 2022-02-04 ENCOUNTER — Encounter: Payer: Self-pay | Admitting: Nurse Practitioner

## 2022-02-04 VITALS — BP 120/82 | HR 97 | Temp 99.0°F | Resp 16 | Ht 64.0 in | Wt 118.2 lb

## 2022-02-04 DIAGNOSIS — D689 Coagulation defect, unspecified: Secondary | ICD-10-CM

## 2022-02-04 DIAGNOSIS — Z7901 Long term (current) use of anticoagulants: Secondary | ICD-10-CM | POA: Diagnosis not present

## 2022-02-04 DIAGNOSIS — J069 Acute upper respiratory infection, unspecified: Secondary | ICD-10-CM

## 2022-02-04 DIAGNOSIS — I739 Peripheral vascular disease, unspecified: Secondary | ICD-10-CM

## 2022-02-04 LAB — POCT INR: INR: 3.5 — AB (ref 2.0–3.0)

## 2022-02-04 MED ORDER — BENZONATATE 100 MG PO CAPS: 100.0000 mg | ORAL_CAPSULE | Freq: Three times a day (TID) | ORAL | 0 refills | Status: DC | PRN

## 2022-02-04 MED ORDER — FLUTICASONE PROPIONATE 50 MCG/ACT NA SUSP: 2.0000 | Freq: Every day | NASAL | 6 refills | Status: DC

## 2022-02-04 MED ORDER — CETIRIZINE HCL 10 MG PO TABS: 10.0000 mg | ORAL_TABLET | Freq: Every day | ORAL | 11 refills | Status: DC

## 2022-02-04 NOTE — Assessment & Plan Note (Signed)
INR elevated today at 3.5.  Changing warfarin dose to 5 mg on Monday, Tuesday, Thursday, Saturday and Sunday.  Taking 2.5 mg on Wednesday and Friday.

## 2022-02-04 NOTE — Progress Notes (Signed)
BP 120/82   Pulse 97   Temp 99 F (37.2 C) (Oral)   Resp 16   Ht '5\' 4"'$  (1.626 m)   Wt 118 lb 3.2 oz (53.6 kg)   SpO2 98%   BMI 20.29 kg/m    Subjective:    Patient ID: Mary [name], female    DOB: Nov 07, 1950, 71 y.o.   MRN: [id number]  HPI: Mary [name] is a 71 y.o. female  Chief Complaint  Patient presents with   URI    Cough, congestion, runny nose for 5 days   Anticoagulation    Follow up   URI: Symptoms started 5 days ago. She says she has had nasal congestion, runny nose, and cough. She denies any fever, sore throat and shortness of breath.  Patient states she has been taking NyQuil for the cough and Tylenol.  Discussed with patient to drink plenty of fluids and get rest.  We will send in prescription for Tessalon Perles, Zyrtec and Flonase.  PAD/Coagulation therapy/ coagulation disorder: She is currently taking warfarin for PAD.  She was seen by Dr. Dian Situ vascular surgery and has had multiple right lower extremity revascularizations.  We will rate rechecking her INR today.  She is currently taking warfarin 5 mg every day except for Friday she takes 2.5 mg.  Her INR today is 3.5.  She denies any abnormal bleeding, headaches or changes in mentation.  We will decrease her warfarin to 2.5 mg  on Wednesday and Friday.  She will continue to take 5 mg on Monday, Tuesday,Thursday, Saturday and Sunday.    Relevant past medical, surgical, family and social history reviewed and updated as indicated. Interim medical history since our last visit reviewed. Allergies and medications reviewed and updated.  Review of Systems  Constitutional: Negative for fever or weight change.  HEENT: Positive for nasal congestion and runny nose Respiratory: Positive for cough and negative for shortness of breath.   Cardiovascular: Negative for chest pain or palpitations.  Gastrointestinal: Negative for abdominal pain, no bowel changes.  Musculoskeletal: Negative for gait problem or joint swelling.   Skin: Negative for rash.  Neurological: Negative for dizziness or headache.  No other specific complaints in a complete review of systems (except as listed in HPI above).      Objective:    BP 120/82   Pulse 97   Temp 99 F (37.2 C) (Oral)   Resp 16   Ht '5\' 4"'$  (1.626 m)   Wt 118 lb 3.2 oz (53.6 kg)   SpO2 98%   BMI 20.29 kg/m   Wt Readings from Last 3 Encounters:  02/04/22 118 lb 3.2 oz (53.6 kg)  01/01/22 120 lb 3.2 oz (54.5 kg)  11/06/21 121 lb 4.8 oz (55 kg)    Physical Exam  Constitutional: Patient appears well-developed and well-nourished.  No distress.  HEENT: head atraumatic, normocephalic, pupils equal and reactive to light, ears TMs clear, neck supple, throat within normal limits Cardiovascular: Normal rate, regular rhythm and normal heart sounds.  No murmur heard. No BLE edema. Pulmonary/Chest: Effort normal and breath sounds normal. No respiratory distress. Abdominal: Soft.  There is no tenderness. Psychiatric: Patient has a normal mood and affect. behavior is normal. Judgment and thought content normal.  Results for orders placed or performed in visit on 02/04/22  POCT INR  Result Value Ref Range   INR 3.5 (A) 2.0 - 3.0      Assessment & Plan:   Problem List Items Addressed This Visit  Cardiovascular and Mediastinum   PAD (peripheral artery disease) (HCC) (Chronic)    INR elevated today at 3.5.  Changing warfarin dose to 5 mg on Monday, Tuesday, Thursday, Saturday and Sunday.  Taking 2.5 mg on Wednesday and Friday.         Hematopoietic and Hemostatic   Coagulation disorder ([place])    INR elevated today at 3.5.  Changing warfarin dose to 5 mg on Monday, Tuesday, Thursday, Saturday and Sunday.  Taking 2.5 mg on Wednesday and Friday.      Relevant Orders   POCT INR (Completed)     Other   Anticoagulated on Coumadin    INR elevated today at 3.5.  Changing warfarin dose to 5 mg on Monday, Tuesday, Thursday, Saturday and Sunday.  Taking 2.5 mg on  Wednesday and Friday.      Relevant Orders   POCT INR (Completed)   Other Visit Diagnoses     Viral upper respiratory tract infection    -  Primary   Drink plenty of fluids to get rest.  Continue taking DayQuil.  Sending prescription for [employer], Flonase and Zyrtec.   Relevant Medications   fluticasone (FLONASE) 50 MCG/ACT nasal spray   cetirizine (ZYRTEC) 10 MG tablet   benzonatate (TESSALON) 100 MG capsule        Follow up plan: Return in about 4 weeks (around 03/04/2022) for follow up.

## 2022-02-09 ENCOUNTER — Other Ambulatory Visit: Payer: Self-pay

## 2022-02-09 ENCOUNTER — Telehealth (INDEPENDENT_AMBULATORY_CARE_PROVIDER_SITE_OTHER): Payer: Medicare PPO | Admitting: Family Medicine

## 2022-02-09 ENCOUNTER — Ambulatory Visit: Payer: Self-pay | Admitting: *Deleted

## 2022-02-09 ENCOUNTER — Telehealth: Payer: Self-pay | Admitting: Nurse Practitioner

## 2022-02-09 ENCOUNTER — Encounter: Payer: Self-pay | Admitting: Family Medicine

## 2022-02-09 DIAGNOSIS — J069 Acute upper respiratory infection, unspecified: Secondary | ICD-10-CM | POA: Diagnosis not present

## 2022-02-09 MED ORDER — METHYLPREDNISOLONE 4 MG PO TBPK: ORAL_TABLET | ORAL | 0 refills | Status: DC

## 2022-02-09 NOTE — Telephone Encounter (Signed)
Summary: Sinus issues/Has seen provider.   Pt called and stated that she was seen on 02/04/22 for sinus issues. Pt took allergy med and it is not helping and would like to know if an antibiotic can be called in. Please advise      Reason for Disposition  [1] Sinus congestion (pressure, fullness) AND [2] present > 10 days  Answer Assessment - Initial Assessment Questions 1. LOCATION: "Where does it hurt?"      No pain- congestion 2. ONSET: "When did the sinus pain start?"  (e.g., hours, days)      2 weeks 3. SEVERITY: "How bad is the pain?"   (Scale 1-10; mild, moderate or severe)   - MILD (1-3): doesn't interfere with normal activities    - MODERATE (4-7): interferes with normal activities (e.g., work or school) or awakens from sleep   - SEVERE (8-10): excruciating pain and patient unable to do any normal activities        Mild- aggravating- sinus drainage, cough 4. RECURRENT SYMPTOM: "Have you ever had sinus problems before?" If Yes, ask: "When was the last time?" and "What happened that time?"      No- this is new 5. NASAL CONGESTION: "Is the nose blocked?" If Yes, ask: "Can you open it or must you breathe through your mouth?"     Drainage only 6. NASAL DISCHARGE: "Do you have discharge from your nose?" If so ask, "What color?"     clear 7. FEVER: "Do you have a fever?" If Yes, ask: "What is it, how was it measured, and when did it start?"      no 8. OTHER SYMPTOMS: "Do you have any other symptoms?" (e.g., sore throat, cough, earache, difficulty breathing)     Cough- clear 9. PREGNANCY: "Is there any chance you are pregnant?" "When was your last menstrual period?"  Protocols used: Sinus Pain or Congestion-A-AH

## 2022-02-09 NOTE — Telephone Encounter (Signed)
  Chief Complaint: sinus symptoms-cough and drainage not better with OTC treatment Symptoms: cough, sinus drainage for 2 weeks Frequency: over 2 weeks- patient was seen 6/7 Pertinent Negatives: Patient denies fever, pain Disposition: '[]'$ ED /'[]'$ Urgent Care (no appt availability in office) / '[]'$ Appointment(In office/virtual)/ '[]'$  [facility]/ '[]'$ Home Care/ '[]'$ Refused Recommended Disposition /'[]'$ [facility]/ '[x]'$  Follow-up with PCP Additional Notes: Patient is requesting antibiotic since the treatment she has been using is not helping

## 2022-02-09 NOTE — Progress Notes (Signed)
Virtual Visit via Telephone Note  I connected with Mary [name] on 02/09/22 at  3:00 PM EDT by telephone and verified that I am speaking with the correct person using two identifiers.  Location: Patient: home Provider: [facility]   I discussed the limitations, risks, security and privacy concerns of performing an evaluation and management service by telephone and the availability of in person appointments. I also discussed with the patient that there may be a patient responsible charge related to this service. The patient expressed understanding and agreed to proceed.   History of Present Illness:  UPPER RESPIRATORY TRACT INFECTION - seen previously 6/7 for same, 5 days of symptoms at that time. Given antihistamine, nasal CCS, tessalon perles. Previous to this had been taking NyQuil and tylenol with little relief.  Fever: no Cough: yes, productive of phlegm Shortness of breath: no Chest pain: no Chest tightness: no Chest congestion: no Nasal congestion: yes Runny nose: yes Post nasal drip: yes Sore throat: no Sinus pressure: no Headache: no Face pain: no Ear pain: no  Ear pressure: no  Vomiting: no Rash: no Context: worse Recurrent sinusitis: no Relief with OTC cold/cough medications: no  Treatments attempted: zyrtec, NyQuil, tessalon perles, flonase, tylenol     Observations/Objective:  Entirety of visit conducted over the phone.  Speaks in full sentences, no respiratory distress. Congested sounding.   Assessment and Plan:  VIRAL URI Moderate sx. Not a candidate for COVID treatment, will defer testing. Given lack of relief with oral decongestant, nasal steroid, antihistamine, will trial steroid taper. Reviewed return and emergency precautions.     I discussed the assessment and treatment plan with the patient. The patient was provided an opportunity to ask questions and all were answered. The patient agreed with the plan and demonstrated an understanding of the  instructions.   The patient was advised to call back or seek an in-person evaluation if the symptoms worsen or if the condition fails to improve as anticipated.  I provided 7 minutes of non-face-to-face time during this encounter.   Myles Gip, DO

## 2022-02-09 NOTE — Telephone Encounter (Signed)
Virtual appointment schedule

## 2022-02-09 NOTE — Telephone Encounter (Signed)
Pt called in waiting for status if med/antibiotic will  be sent for her cough.   Please call back. [place], [place] Phone:  [phone]  Fax:  [phone]

## 2022-02-18 ENCOUNTER — Ambulatory Visit: Payer: Medicare PPO | Admitting: Nurse Practitioner

## 2022-02-24 ENCOUNTER — Ambulatory Visit (INDEPENDENT_AMBULATORY_CARE_PROVIDER_SITE_OTHER): Payer: Medicare PPO | Admitting: Physician Assistant

## 2022-02-24 ENCOUNTER — Encounter: Payer: Self-pay | Admitting: Physician Assistant

## 2022-02-24 VITALS — BP 120/80

## 2022-02-24 DIAGNOSIS — Z0000 Encounter for general adult medical examination without abnormal findings: Secondary | ICD-10-CM

## 2022-02-24 DIAGNOSIS — Z1231 Encounter for screening mammogram for malignant neoplasm of breast: Secondary | ICD-10-CM | POA: Diagnosis not present

## 2022-02-24 DIAGNOSIS — Z1211 Encounter for screening for malignant neoplasm of colon: Secondary | ICD-10-CM

## 2022-03-05 DIAGNOSIS — M2550 Pain in unspecified joint: Secondary | ICD-10-CM | POA: Diagnosis not present

## 2022-03-05 DIAGNOSIS — J309 Allergic rhinitis, unspecified: Secondary | ICD-10-CM | POA: Diagnosis not present

## 2022-03-05 DIAGNOSIS — G8929 Other chronic pain: Secondary | ICD-10-CM | POA: Diagnosis not present

## 2022-03-05 DIAGNOSIS — I10 Essential (primary) hypertension: Secondary | ICD-10-CM | POA: Diagnosis not present

## 2022-03-05 DIAGNOSIS — I739 Peripheral vascular disease, unspecified: Secondary | ICD-10-CM | POA: Diagnosis not present

## 2022-03-05 DIAGNOSIS — Z7901 Long term (current) use of anticoagulants: Secondary | ICD-10-CM | POA: Diagnosis not present

## 2022-03-05 DIAGNOSIS — Z720 Tobacco use: Secondary | ICD-10-CM | POA: Diagnosis not present

## 2022-03-05 DIAGNOSIS — E785 Hyperlipidemia, unspecified: Secondary | ICD-10-CM | POA: Diagnosis not present

## 2022-03-05 DIAGNOSIS — Z79899 Other long term (current) drug therapy: Secondary | ICD-10-CM | POA: Diagnosis not present

## 2022-03-06 ENCOUNTER — Other Ambulatory Visit: Payer: Self-pay

## 2022-03-06 ENCOUNTER — Ambulatory Visit (INDEPENDENT_AMBULATORY_CARE_PROVIDER_SITE_OTHER): Payer: Medicare PPO | Admitting: Nurse Practitioner

## 2022-03-06 ENCOUNTER — Encounter: Payer: Self-pay | Admitting: Nurse Practitioner

## 2022-03-06 VITALS — BP 118/78 | HR 98 | Temp 98.2°F | Resp 16 | Ht 64.0 in | Wt 115.3 lb

## 2022-03-06 DIAGNOSIS — I739 Peripheral vascular disease, unspecified: Secondary | ICD-10-CM | POA: Diagnosis not present

## 2022-03-06 DIAGNOSIS — I10 Essential (primary) hypertension: Secondary | ICD-10-CM | POA: Diagnosis not present

## 2022-03-06 DIAGNOSIS — D689 Coagulation defect, unspecified: Secondary | ICD-10-CM | POA: Diagnosis not present

## 2022-03-06 DIAGNOSIS — E782 Mixed hyperlipidemia: Secondary | ICD-10-CM

## 2022-03-06 DIAGNOSIS — Z7901 Long term (current) use of anticoagulants: Secondary | ICD-10-CM

## 2022-03-06 LAB — POCT INR: INR: 1.9 — AB (ref 2.0–3.0)

## 2022-03-06 MED ORDER — WARFARIN SODIUM 3 MG PO TABS: 3.0000 mg | ORAL_TABLET | Freq: Every day | ORAL | 0 refills | Status: DC

## 2022-03-06 NOTE — Assessment & Plan Note (Signed)
INR 1.9 today.  Not at goal.  Change in warfarin dose.  Patient will take warfarin 5 mg Monday, Tuesday, Thursday, Saturday and Sunday.  On Wednesday and Friday she will take 3 mg dose.

## 2022-03-06 NOTE — Assessment & Plan Note (Signed)
Blood pressure is at goal today 118/78.  She is currently taking amlodipine 5 mg daily.  Continue with current dose

## 2022-03-06 NOTE — Progress Notes (Signed)
BP 118/78   Pulse 98   Temp 98.2 F (36.8 C) (Oral)   Resp 16   Ht '5\' 4"'$  (1.626 m)   Wt 115 lb 4.8 oz (52.3 kg)   SpO2 99%   BMI 19.79 kg/m    Subjective:    Patient ID: Mary [name], female    DOB: 01-30-1951, 71 y.o.   MRN: [id number]  HPI: Mary [name] is a 71 y.o. female  Chief Complaint  Patient presents with   Anticoagulation  PAD/Coagulation therapy/ coagulation disorder: She has a history of PAD which she has been taking warfarin for.  She has had multiple right lower extremity revascularizations by Dr. Dian Situ vascular surgery.  She has been on warfarin therapy.  She is here for an INR check.  She is currently taking warfarin 5 mg every day except for Wednesday and Friday she takes 2.5 mg her INR today is 1.9.  She denies any abnormal bleeding, changes in mentation or headaches.  Changing her dose due to her INR not at goal.  She will take warfarin 5 mg every day except for Wednesday and Friday those 2 days she will take 3 mg.   HTN: Her blood pressure today is 118/78.  She is currently taking amlodipine 5 mg daily.  She denies any chest pain, shortness of breath, headaches or blurred vision.  Hyperlipidemia: Her last LDL was 57 on 01/09/2021.  She is currently taking atorvastatin 10 mg at bedtime.  She denies any myalgia.  We will get labs today.  Relevant past medical, surgical, family and social history reviewed and updated as indicated. Interim medical history since our last visit reviewed. Allergies and medications reviewed and updated.  Review of Systems  Constitutional: Negative for fever or weight change.  Respiratory: Negative for cough and shortness of breath.   Cardiovascular: Negative for chest pain or palpitations.  Gastrointestinal: Negative for abdominal pain, no bowel changes.  Musculoskeletal: Negative for gait problem or joint swelling.  Skin: Negative for rash.  Neurological: Negative for dizziness or headache.  No other specific complaints in a  complete review of systems (except as listed in HPI above).      Objective:    BP 118/78   Pulse 98   Temp 98.2 F (36.8 C) (Oral)   Resp 16   Ht '5\' 4"'$  (1.626 m)   Wt 115 lb 4.8 oz (52.3 kg)   SpO2 99%   BMI 19.79 kg/m   Wt Readings from Last 3 Encounters:  03/06/22 115 lb 4.8 oz (52.3 kg)  02/04/22 118 lb 3.2 oz (53.6 kg)  01/01/22 120 lb 3.2 oz (54.5 kg)    Physical Exam  Constitutional: Patient appears well-developed and well-nourished. Obese  No distress.  HEENT: head atraumatic, normocephalic, pupils equal and reactive to light,  neck supple Cardiovascular: Normal rate, regular rhythm and normal heart sounds.  No murmur heard. No BLE edema. Pulmonary/Chest: Effort normal and breath sounds normal. No respiratory distress. Abdominal: Soft.  There is no tenderness. Psychiatric: Patient has a normal mood and affect. behavior is normal. Judgment and thought content normal.   Results for orders placed or performed in visit on 03/06/22  POCT INR  Result Value Ref Range   INR 1.9 (A) 2.0 - 3.0      Assessment & Plan:   Problem List Items Addressed This Visit       Cardiovascular and Mediastinum   PAD (peripheral artery disease) (HCC) (Chronic)    INR 1.9 today.  Not at goal.  Change in warfarin dose.  Patient will take warfarin 5 mg Monday, Tuesday, Thursday, Saturday and Sunday.  On Wednesday and Friday she will take 3 mg dose.      Relevant Medications   warfarin (COUMADIN) 3 MG tablet   Essential hypertension    Blood pressure is at goal today 118/78.  She is currently taking amlodipine 5 mg daily.  Continue with current dose      Relevant Medications   warfarin (COUMADIN) 3 MG tablet   Other Relevant Orders   CBC with Differential/Platelet   COMPLETE METABOLIC PANEL WITH GFR     Hematopoietic and Hemostatic   Coagulation disorder (HCC)    INR 1.9 today.  Not at goal.  Change in warfarin dose.  Patient will take warfarin 5 mg Monday, Tuesday, Thursday,  Saturday and Sunday.  On Wednesday and Friday she will take 3 mg dose.      Relevant Medications   warfarin (COUMADIN) 3 MG tablet   Other Relevant Orders   POCT INR (Completed)     Other   Mixed hyperlipidemia    Patient is currently taking atorvastatin 10 mg at bedtime.  Her last LDL was 57 on 01/09/2021.  We will get labs today.      Relevant Medications   warfarin (COUMADIN) 3 MG tablet   Other Relevant Orders   Lipid panel   COMPLETE METABOLIC PANEL WITH GFR   Anticoagulated on Coumadin - Primary    INR 1.9 today.  Not at goal.  Change in warfarin dose.  Patient will take warfarin 5 mg Monday, Tuesday, Thursday, Saturday and Sunday.  On Wednesday and Friday she will take 3 mg dose.      Relevant Medications   warfarin (COUMADIN) 3 MG tablet   Other Relevant Orders   POCT INR (Completed)     Follow up plan: Return in about 4 weeks (around 04/03/2022) for follow up.

## 2022-03-06 NOTE — Assessment & Plan Note (Signed)
Patient is currently taking atorvastatin 10 mg at bedtime.  Her last LDL was 57 on 01/09/2021.  We will get labs today.

## 2022-03-07 LAB — COMPLETE METABOLIC PANEL WITH GFR
AG Ratio: 1.6 (calc) (ref 1.0–2.5)
ALT: 12 U/L (ref 6–29)
AST: 18 U/L (ref 10–35)
Albumin: 4.2 g/dL (ref 3.6–5.1)
Alkaline phosphatase (APISO): 73 U/L (ref 37–153)
BUN: 11 mg/dL (ref 7–25)
CO2: 29 mmol/L (ref 20–32)
Calcium: 9.1 mg/dL (ref 8.6–10.4)
Chloride: 105 mmol/L (ref 98–110)
Creat: 0.79 mg/dL (ref 0.50–1.05)
Globulin: 2.6 g/dL (calc) (ref 1.9–3.7)
Glucose, Bld: 54 mg/dL — ABNORMAL LOW (ref 65–99)
Potassium: 3.9 mmol/L (ref 3.5–5.3)
Sodium: 140 mmol/L (ref 135–146)
Total Bilirubin: 0.4 mg/dL (ref 0.2–1.2)
Total Protein: 6.8 g/dL (ref 6.1–8.1)
eGFR: 81 mL/min/{1.73_m2} (ref >=60)

## 2022-03-07 LAB — LIPID PANEL
Cholesterol: 127 mg/dL (ref <200)
HDL: 49 mg/dL — ABNORMAL LOW (ref >=50)
LDL Cholesterol (Calc): 55 mg/dL (calc)
Non-HDL Cholesterol (Calc): 78 mg/dL (calc) (ref <130)
Total CHOL/HDL Ratio: 2.6 (calc) (ref <5.0)
Triglycerides: 151 mg/dL — ABNORMAL HIGH (ref <150)

## 2022-03-07 LAB — CBC WITH DIFFERENTIAL/PLATELET
Absolute Monocytes: 691 cells/uL (ref 200–950)
Basophils Absolute: 32 cells/uL (ref 0–200)
Basophils Relative: 0.5 %
Eosinophils Absolute: 218 cells/uL (ref 15–500)
Eosinophils Relative: 3.4 %
HCT: 38.3 % (ref 35.0–45.0)
Hemoglobin: 13.2 g/dL (ref 11.7–15.5)
Lymphs Abs: 3552 cells/uL (ref 850–3900)
MCH: 30.4 pg (ref 27.0–33.0)
MCHC: 34.5 g/dL (ref 32.0–36.0)
MCV: 88.2 fL (ref 80.0–100.0)
MPV: 9.3 fL (ref 7.5–12.5)
Monocytes Relative: 10.8 %
Neutro Abs: 1907 cells/uL (ref 1500–7800)
Neutrophils Relative %: 29.8 %
Platelets: 289 10*3/uL (ref 140–400)
RBC: 4.34 10*6/uL (ref 3.80–5.10)
RDW: 13.8 % (ref 11.0–15.0)
Total Lymphocyte: 55.5 %
WBC: 6.4 10*3/uL (ref 3.8–10.8)

## 2022-03-09 ENCOUNTER — Ambulatory Visit: Payer: Self-pay

## 2022-03-09 DIAGNOSIS — L600 Ingrowing nail: Secondary | ICD-10-CM | POA: Diagnosis not present

## 2022-03-09 DIAGNOSIS — B351 Tinea unguium: Secondary | ICD-10-CM | POA: Diagnosis not present

## 2022-03-09 DIAGNOSIS — L851 Acquired keratosis [keratoderma] palmaris et plantaris: Secondary | ICD-10-CM | POA: Diagnosis not present

## 2022-03-09 DIAGNOSIS — I739 Peripheral vascular disease, unspecified: Secondary | ICD-10-CM | POA: Diagnosis not present

## 2022-03-09 DIAGNOSIS — M898X9 Other specified disorders of bone, unspecified site: Secondary | ICD-10-CM | POA: Diagnosis not present

## 2022-03-09 NOTE — Telephone Encounter (Signed)
Pt called, advised her on dosage per OV note on 03/06/22. Pt verbalized understanding and didn't need further assistance.  From OV notes of 03/06/2022 -  Hematopoietic and Hemostatic      Coagulation disorder (HCC)      INR 1.9 today.  Not at goal.  Change in warfarin dose.  Patient will take warfarin 5 mg Monday, Tuesday, Thursday, Saturday and Sunday.  On Wednesday and Friday she will take 3 mg dose.       Reason for Disposition  Caller has medicine question only, adult not sick, AND triager answers question  Answer Assessment - Initial Assessment Questions 1. NAME of MEDICATION: "What medicine are you calling about?"     coumadin 2. QUESTION: "What is your question?" (e.g., double dose of medicine, side effect)     Correct dosage of medication 3. PRESCRIBING HCP: "Who prescribed it?" Reason: if prescribed by specialist, call should be referred to that group.     Almyra Free, NP  Protocols used: Medication Question Call-A-AH

## 2022-03-09 NOTE — Telephone Encounter (Signed)
Summary: rx instructions   Pt called asking for instructions on taking her Coumadin  Almyra Free changed how she is supposed to take it   [phone]     [facility] to return call.  From OV notes of 03/06/2022 -  Hematopoietic and Hemostatic     Coagulation disorder (HCC)      INR 1.9 today.  Not at goal.  Change in warfarin dose.  Patient will take warfarin 5 mg Monday, Tuesday, Thursday, Saturday and Sunday.  On Wednesday and Friday she will take 3 mg dose.

## 2022-03-09 NOTE — Telephone Encounter (Signed)
Summary: rx instructions   Pt called asking for instructions on taking her Coumadin  Almyra Free changed how she is supposed to take it   [phone]      Called pt lmom

## 2022-04-01 ENCOUNTER — Other Ambulatory Visit: Payer: Self-pay | Admitting: Unknown Physician Specialty

## 2022-04-01 DIAGNOSIS — I10 Essential (primary) hypertension: Secondary | ICD-10-CM

## 2022-04-02 NOTE — Telephone Encounter (Signed)
Requested Prescriptions  Pending Prescriptions Disp Refills  . amLODipine (NORVASC) 5 MG tablet [Pharmacy Med Name: AMLODIPINE BESYLATE 5 MG Tablet] 90 tablet 1    Sig: TAKE 1 TABLET EVERY DAY     Cardiovascular: Calcium Channel Blockers 2 Passed - 04/01/2022 10:40 AM      Passed - Last BP in normal range    BP Readings from Last 1 Encounters:  03/06/22 118/78         Passed - Last Heart Rate in normal range    Pulse Readings from Last 1 Encounters:  03/06/22 98         Passed - Valid encounter within last 6 months    Recent Outpatient Visits          3 weeks ago Anticoagulated on Coumadin   [facility] Bo Merino, FNP   1 month ago Encounter for [employer] annual wellness exam   [facility] Mecum, Dani Gobble, PA-C   1 month ago Viral URI   [place], DO   1 month ago Viral upper respiratory tract infection   [place], FNP   3 months ago Anticoagulated on Coumadin   [facility] Bo Merino, FNP      Future Appointments            In 4 days Reece Packer, Myna Hidalgo, [facility], [place]   In 11 months Reece Packer, Myna Hidalgo, [facility], [facility]

## 2022-04-06 ENCOUNTER — Ambulatory Visit (INDEPENDENT_AMBULATORY_CARE_PROVIDER_SITE_OTHER): Payer: Medicare PPO | Admitting: Nurse Practitioner

## 2022-04-06 ENCOUNTER — Encounter: Payer: Self-pay | Admitting: Nurse Practitioner

## 2022-04-06 VITALS — BP 114/70 | HR 96 | Temp 98.5°F | Resp 16 | Ht 64.0 in | Wt 116.5 lb

## 2022-04-06 DIAGNOSIS — I739 Peripheral vascular disease, unspecified: Secondary | ICD-10-CM | POA: Diagnosis not present

## 2022-04-06 DIAGNOSIS — D689 Coagulation defect, unspecified: Secondary | ICD-10-CM

## 2022-04-06 DIAGNOSIS — Z7901 Long term (current) use of anticoagulants: Secondary | ICD-10-CM | POA: Diagnosis not present

## 2022-04-06 LAB — POCT INR: INR: 2.3 (ref 2.0–3.0)

## 2022-04-06 NOTE — Assessment & Plan Note (Signed)
INR at goal today.  Continue taking warfarin 3 mg on Wednesday and Friday, and warfarin 5 mg on Monday, Tuesday, Thursday, Saturday and Sunday.

## 2022-04-06 NOTE — Progress Notes (Signed)
BP 114/70   Pulse 96   Temp 98.5 F (36.9 C) (Oral)   Resp 16   Ht '5\' 4"'$  (1.626 m)   Wt 116 lb 8 oz (52.8 kg)   SpO2 95%   BMI 20.00 kg/m    Subjective:    Patient ID: Mary [name], female    DOB: December 28, 1950, 71 y.o.   MRN: [id number]  HPI: Mary [name] is a 71 y.o. female  Chief Complaint  Patient presents with   Anticoagulation   PAD/Coagulation therapy/ coagulation disorder: Her INR today is 2.3.  She is currently taking warfarin 5 mg every day except for Wednesday and Friday.  On Wednesday and Friday she is taking 3 mg of warfarin.  She has had multiple right lower extremity revascularizations by Dr. Dian Situ vascular surgery.  She has been on warfarin therapy for a long time.  She denies any abnormal bleeding, changes in her mentation or headaches.  We will continue with current dosing.   Relevant past medical, surgical, family and social history reviewed and updated as indicated. Interim medical history since our last visit reviewed. Allergies and medications reviewed and updated.  Review of Systems  Constitutional: Negative for fever or weight change.  Respiratory: Negative for cough and shortness of breath.   Cardiovascular: Negative for chest pain or palpitations.  Gastrointestinal: Negative for abdominal pain, no bowel changes.  Musculoskeletal: Negative for gait problem or joint swelling.  Skin: Negative for rash.  Neurological: Negative for dizziness or headache.  No other specific complaints in a complete review of systems (except as listed in HPI above).      Objective:    BP 114/70   Pulse 96   Temp 98.5 F (36.9 C) (Oral)   Resp 16   Ht '5\' 4"'$  (1.626 m)   Wt 116 lb 8 oz (52.8 kg)   SpO2 95%   BMI 20.00 kg/m   Wt Readings from Last 3 Encounters:  04/06/22 116 lb 8 oz (52.8 kg)  03/06/22 115 lb 4.8 oz (52.3 kg)  02/04/22 118 lb 3.2 oz (53.6 kg)    Physical Exam  Constitutional: Patient appears well-developed and well-nourished.  No distress.   HEENT: head atraumatic, normocephalic, pupils equal and reactive to light,  neck supple Cardiovascular: Normal rate, regular rhythm and normal heart sounds.  No murmur heard. No BLE edema. Pulmonary/Chest: Effort normal and breath sounds normal. No respiratory distress. Abdominal: Soft.  There is no tenderness. Psychiatric: Patient has a normal mood and affect. behavior is normal. Judgment and thought content normal.  Results for orders placed or performed in visit on 04/06/22  POCT INR  Result Value Ref Range   INR 2.3 2.0 - 3.0      Assessment & Plan:   Problem List Items Addressed This Visit       Cardiovascular and Mediastinum   PAD (peripheral artery disease) (HCC) (Chronic)    INR at goal today.  Continue taking warfarin 3 mg on Wednesday and Friday, and warfarin 5 mg on Monday, Tuesday, Thursday, Saturday and Sunday.        Hematopoietic and Hemostatic   Coagulation disorder ([place])    INR at goal today.  Continue taking warfarin 3 mg on Wednesday and Friday, and warfarin 5 mg on Monday, Tuesday, Thursday, Saturday and Sunday.      Relevant Orders   POCT INR (Completed)     Other   Anticoagulated on Coumadin - Primary    INR at goal today.  Continue taking warfarin 3 mg on Wednesday and Friday, and warfarin 5 mg on Monday, Tuesday, Thursday, Saturday and Sunday.      Relevant Orders   POCT INR (Completed)     Follow up plan: Return in about 4 weeks (around 05/04/2022) for follow up.

## 2022-05-06 ENCOUNTER — Ambulatory Visit (INDEPENDENT_AMBULATORY_CARE_PROVIDER_SITE_OTHER): Payer: Medicare PPO

## 2022-05-06 DIAGNOSIS — I10 Essential (primary) hypertension: Secondary | ICD-10-CM

## 2022-05-06 DIAGNOSIS — E782 Mixed hyperlipidemia: Secondary | ICD-10-CM

## 2022-05-06 DIAGNOSIS — Z720 Tobacco use: Secondary | ICD-10-CM

## 2022-05-06 NOTE — Patient Instructions (Signed)
Visit Information It was great speaking with you today!  Please let me know if you have any questions about our visit.   Goals Addressed             This Visit's Progress    Track and Manage My Blood Pressure-Hypertension   On track    Timeframe:  Long-Range Goal Priority:  High Start Date: 03/06/2021                             Expected End Date: 03/07/2023                       Follow Up within 90 days   - check blood pressure weekly    Why is this important?   You won't feel high blood pressure, but it can still hurt your blood vessels.  High blood pressure can cause heart or kidney problems. It can also cause a stroke.  Making lifestyle changes like losing a little weight or eating less salt will help.  Checking your blood pressure at home and at different times of the day can help to control blood pressure.  If the doctor prescribes medicine remember to take it the way the doctor ordered.  Call the office if you cannot afford the medicine or if there are questions about it.     Notes:         Patient Care Plan: General Pharmacy (Adult)     Problem Identified: Hypertension, Hyperlipidemia, Coronary Artery Disease, Tobacco use, and Peripheral Artery Disease   Priority: High     Long-Range Goal: Patient-Specific Goal   Start Date: 03/06/2021  Expected End Date: 12/25/2022  This Visit's Progress: On track  Recent Progress: On track  Priority: High  Note:   Current Barriers:  Unable to quit smoking  Pharmacist Clinical Goal(s):  Patient will maintain control of blood pressure as evidenced by BP less than 140/90  reduce and quit tobacco use through collaboration with PharmD and provider.   Interventions: 1:1 collaboration with Delsa Grana, PA-C regarding development and update of comprehensive plan of care as evidenced by provider attestation and co-signature Inter-disciplinary care team collaboration (see longitudinal plan of care) Comprehensive medication review  performed; medication list updated in electronic medical record  Hypertension (BP goal <140/90) -Controlled -Current treatment: Amlodipine 5 mg daily  -Medications previously tried: NA   -Current home readings: 128/70 -Current dietary habits: Does salt foods,  -Current exercise habits: Walks daily -Denies hypotensive/hypertensive symptoms -Recommended to continue current medication  Hyperlipidemia: (LDL goal < 70) -Controlled -History of CAD, PAD -Current treatment: Atorvastatin 5 mg daily  -Medications previously tried: NA  -Current home blood pressures: 130/82  -Educated on Importance of limiting foods high in cholesterol; -Counseled on importance of smoking cessation for protecting against occlusions, worsening PAD -Recommended to continue current medication  Tobacco use (Goal Quit smoking) -Uncontrolled -Previous quit attempts: Gum (nausea)  -Current treatment  1/2 ppd  -Patient smokes after 30 minutes of waking -Patient triggers include: Waking up, bedtime  -On a scale of 1-10, reports MOTIVATION to quit is 1 -On a scale of 1-10, reports CONFIDENCE in quitting is 1 -Patient in pre-contemplative stage of quitting. Counseled patient on importance of cutting back and eventually quitting, but patient wants to "work on it on her own."  -Provided contact information for [employer] (1-800-QUIT-NOW) and encouraged patient to reach out to this group for support.  Patient  Goals/Self-Care Activities Patient will:  - check blood pressure weekly, document, and provide at future appointments  Follow Up Plan: No further follow up required: Patient with contact clinical team with further questions.    Patient agreed to services and verbal consent obtained.   The patient verbalized understanding of instructions, educational materials, and care plan provided today and DECLINED offer to receive copy of patient instructions, educational materials, and care plan.   Mary [name], [place] Pharmacist Practitioner  [facility] [phone]

## 2022-05-06 NOTE — Progress Notes (Signed)
Chronic Care Management Pharmacy Note  05/06/2022 Name:  Mary [name] MRN:  [id number] DOB:  September 16, 1950  Summary: Patient presents for CCM follow-up. Discussed again the need to quit smoking.   Recommendations/Changes made from today's visit: Continue Current Medications  Plan: No further follow up required: Patient with contact clinical team with further questions.  Subjective: Mary [name] is an 71 y.o. year old female who is a primary patient of Bo Merino, FNP.  The CCM team was consulted for assistance with disease management and care coordination needs.    Engaged with patient by telephone for follow up visit in response to provider referral for pharmacy case management and/or care coordination services.   Consent to Services:  The patient was given information about Chronic Care Management services, agreed to services, and gave verbal consent prior to initiation of services.  Please see initial visit note for detailed documentation.   Patient Care Team: Bo Merino, FNP as PCP - General (Nurse Practitioner) Lucky Cowboy Erskine Squibb, MD as Referring Physician (Vascular Surgery) Germaine Pomfret, [facility] (Pharmacist) Virgel Manifold, MD (Inactive) as Consulting Physician (Gastroenterology)  Recent office visits: 11/06/21: Patient presented to Serafina Royals, FNP for anti-coagulation. 02/20/21: Patient presented to Clemetine Marker, LPN for AWV.   Recent consult visits: None in previous 6 months  Hospital visits: None in previous 6 months   Objective:  Lab Results  Component Value Date   CREATININE 0.79 03/06/2022   BUN 11 03/06/2022   GFRNONAA >60 09/29/2021   GFRAA 90 01/09/2021   NA 140 03/06/2022   K 3.9 03/06/2022   CALCIUM 9.1 03/06/2022   CO2 29 03/06/2022   GLUCOSE 54 (L) 03/06/2022    Lab Results  Component Value Date/Time   HGBA1C 6.2 (H) 10/13/2017 01:32 PM    Last diabetic Eye exam: No results found for: "HMDIABEYEEXA"  Last diabetic Foot exam: No  results found for: "HMDIABFOOTEX"   Lab Results  Component Value Date   CHOL 127 03/06/2022   HDL 49 (L) 03/06/2022   LDLCALC 55 03/06/2022   TRIG 151 (H) 03/06/2022   CHOLHDL 2.6 03/06/2022       Latest Ref Rng & Units 03/06/2022   10:56 AM 09/29/2021    2:23 PM 01/09/2021   11:33 AM  Hepatic Function  Total Protein 6.1 - 8.1 g/dL 6.8  7.7  7.0   Albumin 3.5 - 5.0 g/dL  4.2    AST 10 - 35 U/L '18  22  19   ' ALT 6 - 29 U/L '12  14  10   ' Alk Phosphatase 38 - 126 U/L  82    Total Bilirubin 0.2 - 1.2 mg/dL 0.4  0.5  0.3   Bilirubin, Direct 0.0 - 0.2 mg/dL  0.1      Lab Results  Component Value Date/Time   TSH 2.13 02/22/2018 09:03 AM   FREET4 1.0 02/22/2018 09:03 AM       Latest Ref Rng & Units 03/06/2022   10:56 AM 09/29/2021    2:23 PM 01/09/2021   11:33 AM  CBC  WBC 3.8 - 10.8 Thousand/uL 6.4  5.0  6.3   Hemoglobin 11.7 - 15.5 g/dL 13.2  13.7  13.5   Hematocrit 35.0 - 45.0 % 38.3  41.7  39.9   Platelets 140 - 400 Thousand/uL 289  292  298     Lab Results  Component Value Date/Time   VD25OH 25 (L) 01/09/2021 11:33 AM    Clinical ASCVD:  No  The ASCVD Risk score (Arnett DK, et al., 2019) failed to calculate for the following reasons:   The valid total cholesterol range is 130 to 320 mg/dL       04/06/2022   10:30 AM 03/06/2022   10:39 AM 02/24/2022    8:56 AM  Depression screen PHQ 2/9  Decreased Interest 0 0 0  Down, Depressed, Hopeless 0 0 0  PHQ - 2 Score 0 0 0  Altered sleeping 0  0  Tired, decreased energy 0  0  Change in appetite 0  0  Feeling bad or failure about yourself  0  0  Trouble concentrating 0  0  Moving slowly or fidgety/restless 0  0  Suicidal thoughts 0  0  PHQ-9 Score 0  0  Difficult doing work/chores Not difficult at all  Not difficult at all    Social History   Tobacco Use  Smoking Status Every Day   Packs/day: 0.50   Years: 40.00   Total pack years: 20.00   Types: Cigarettes  Smokeless Tobacco Never   BP Readings from Last 3  Encounters:  04/06/22 114/70  03/06/22 118/78  02/24/22 120/80   Pulse Readings from Last 3 Encounters:  04/06/22 96  03/06/22 98  02/04/22 97   Wt Readings from Last 3 Encounters:  04/06/22 116 lb 8 oz (52.8 kg)  03/06/22 115 lb 4.8 oz (52.3 kg)  02/04/22 118 lb 3.2 oz (53.6 kg)   BMI Readings from Last 3 Encounters:  04/06/22 20.00 kg/m  03/06/22 19.79 kg/m  02/04/22 20.29 kg/m    Assessment/Interventions: Review of patient past medical history, allergies, medications, health status, including review of consultants reports, laboratory and other test data, was performed as part of comprehensive evaluation and provision of chronic care management services.   SDOH:  (Social Determinants of Health) assessments and interventions performed: Yes SDOH Interventions    Flowsheet Row Chronic Care Management from 09/04/2020 in [place] from 02/16/2019 in [facility]  SDOH Interventions    Transportation Interventions Intervention Not Indicated --  Depression Interventions/Treatment  -- OMB5-5 Score <4 Follow-up Not Indicated  Financial Strain Interventions Intervention Not Indicated --      [place]: No Food Insecurity (02/24/2022)  Housing: Low Risk  (02/24/2022)  Transportation Needs: No Transportation Needs (02/24/2022)  Alcohol Screen: Low Risk  (03/06/2022)  Depression (PHQ2-9): Low Risk  (04/06/2022)  Financial Resource Strain: Low Risk  (02/24/2022)  Physical Activity: Sufficiently Active (02/24/2022)  Social Connections: Socially Isolated (02/24/2022)  Stress: No Stress Concern Present (02/24/2022)  Tobacco Use: High Risk (04/06/2022)    [place]  No Known Allergies  Medications Reviewed Today     Reviewed by Bo Merino, FNP (Nurse Practitioner) on 04/06/22 at 71  Med List Status: <None>   Medication Order Taking? Sig Documenting Provider Last Dose Status Informant   acetaminophen (TYLENOL) 500 MG tablet [id number] Yes Take 500 mg by mouth 2 (two) times daily as needed for moderate pain or headache. [provider] Taking Active Self  amLODipine (NORVASC) 5 MG tablet [id number] Yes TAKE 1 TABLET EVERY DAY Bo Merino, FNP Taking Active   atorvastatin (LIPITOR) 10 MG tablet [id number] Yes TAKE 1 TABLET AT BEDTIME Bo Merino, FNP Taking Active   cetirizine (ZYRTEC) 10 MG tablet [id number] Yes Take 1 tablet (10 mg total) by mouth daily. Bo Merino, FNP Taking Active   cholecalciferol (VITAMIN D3)  25 MCG (1000 UNIT) tablet [id number] Yes Take 2 tablets (2,000 Units total) by mouth daily. Delsa Grana, PA-C Taking Active   vitamin E 400 UNIT capsule [id number] Yes Take 180 Units by mouth daily.  [provider] Taking Active Self           Med Note Clemetine Marker D   Tue Feb 20, 2020 10:19 AM)    warfarin (COUMADIN) 3 MG tablet [id number] Yes Take 1 tablet (3 mg total) by mouth daily. Bo Merino, FNP Taking Active   warfarin (COUMADIN) 5 MG tablet [id number] Yes Take 1 tab (5 mg) PO once daily 6 days of the week and take 1.2 tab (2.5 mg) tab po once a week on fridays Delsa Grana, PA-C Taking Active             Patient Active Problem List   Diagnosis Date Noted   Coagulation disorder ([place]) 01/01/2022   Anticoagulated on Coumadin 02/05/2020   History of gout 06/23/2019   Coronary artery disease 02/16/2019   Benign neoplasm of cecum    Benign neoplasm of transverse colon    Polyp of sigmoid colon    External hemorrhoids    Hip arthritis 10/27/2017   Breast lump on left side at 8 o'clock position 07/08/2017   Breast lump on right side at 7 o'clock position 11/14/2015   Encounter for monitoring Coumadin therapy 02/27/2015   PAD (peripheral artery disease) ([place])    Essential hypertension    Mixed hyperlipidemia    Tobacco abuse    Hyperuricemia     Immunization History  Administered Date(s) Administered   Moderna  Sars-Covid-2 Vaccination 10/28/2019, 11/25/2019   PFIZER(Purple Top)SARS-COV-2 Vaccination 08/11/2021   Tdap 09/27/2017    Conditions to be addressed/monitored:  Hypertension, Hyperlipidemia, Coronary Artery Disease, Tobacco use, and Peripheral Artery Disease  Care Plan : General Pharmacy (Adult)  Updates made by Germaine Pomfret, RPH since 05/06/2022 12:00 AM     Problem: Hypertension, Hyperlipidemia, Coronary Artery Disease, Tobacco use, and Peripheral Artery Disease   Priority: High     Long-Range Goal: Patient-Specific Goal   Start Date: 03/06/2021  Expected End Date: 12/25/2022  This Visit's Progress: On track  Recent Progress: On track  Priority: High  Note:   Current Barriers:  Unable to quit smoking  Pharmacist Clinical Goal(s):  Patient will maintain control of blood pressure as evidenced by BP less than 140/90  reduce and quit tobacco use through collaboration with PharmD and provider.   Interventions: 1:1 collaboration with Delsa Grana, PA-C regarding development and update of comprehensive plan of care as evidenced by provider attestation and co-signature Inter-disciplinary care team collaboration (see longitudinal plan of care) Comprehensive medication review performed; medication list updated in electronic medical record  Hypertension (BP goal <140/90) -Controlled -Current treatment: Amlodipine 5 mg daily  -Medications previously tried: NA   -Current home readings: 128/70 -Current dietary habits: Does salt foods,  -Current exercise habits: Walks daily -Denies hypotensive/hypertensive symptoms -Recommended to continue current medication  Hyperlipidemia: (LDL goal < 70) -Controlled -History of CAD, PAD -Current treatment: Atorvastatin 5 mg daily  -Medications previously tried: NA  -Current home blood pressures: 130/82  -Educated on Importance of limiting foods high in cholesterol; -Counseled on importance of smoking cessation for protecting against  occlusions, worsening PAD -Recommended to continue current medication  Tobacco use (Goal Quit smoking) -Uncontrolled -Previous quit attempts: Gum (nausea)  -Current treatment  1/2 ppd  -Patient smokes after 30 minutes of waking -Patient triggers include: Waking  up, bedtime  -On a scale of 1-10, reports MOTIVATION to quit is 1 -On a scale of 1-10, reports CONFIDENCE in quitting is 1 -Patient in pre-contemplative stage of quitting. Counseled patient on importance of cutting back and eventually quitting, but patient wants to "work on it on her own."  -Provided contact information for [employer] (1-800-QUIT-NOW) and encouraged patient to reach out to this group for support.  Patient Goals/Self-Care Activities Patient will:  - check blood pressure weekly, document, and provide at future appointments  Follow Up Plan: No further follow up required: Patient with contact clinical team with further questions.     Medication Assistance: None required.  Patient affirms current coverage meets needs.  Compliance/Adherence/Medication fill history: Care Gaps: DEXA Scan  Star-Rating Drugs: Atorvastatin 10 mg daily: LF 11/26/20 for 90DS   Patient's preferred pharmacy is:  [facility] [address] [place] [address] Phone: [phone] Fax: [phone]  [place], [place] [address] Phone: [phone] Fax: [phone]  Uses pill box? Yes Pt endorses 90% compliance  We discussed: Current pharmacy is preferred with insurance plan and patient is satisfied with pharmacy services Patient decided to: Continue current medication management strategy  Care Plan and Follow Up Patient Decision:  Patient agrees to Care Plan and Follow-up.  Plan: No further follow up required: Patient with contact clinical team with further questions.  Malva Limes, [place] Pharmacist Practitioner  [facility] [phone]

## 2022-05-07 ENCOUNTER — Encounter: Payer: Self-pay | Admitting: Nurse Practitioner

## 2022-05-07 ENCOUNTER — Ambulatory Visit (INDEPENDENT_AMBULATORY_CARE_PROVIDER_SITE_OTHER): Payer: Medicare PPO | Admitting: Nurse Practitioner

## 2022-05-07 ENCOUNTER — Other Ambulatory Visit: Payer: Self-pay

## 2022-05-07 VITALS — BP 120/72 | HR 97 | Temp 98.2°F | Resp 18 | Ht 64.0 in | Wt 117.1 lb

## 2022-05-07 DIAGNOSIS — I2510 Atherosclerotic heart disease of native coronary artery without angina pectoris: Secondary | ICD-10-CM

## 2022-05-07 DIAGNOSIS — E782 Mixed hyperlipidemia: Secondary | ICD-10-CM

## 2022-05-07 DIAGNOSIS — Z720 Tobacco use: Secondary | ICD-10-CM | POA: Diagnosis not present

## 2022-05-07 DIAGNOSIS — D689 Coagulation defect, unspecified: Secondary | ICD-10-CM | POA: Diagnosis not present

## 2022-05-07 DIAGNOSIS — I10 Essential (primary) hypertension: Secondary | ICD-10-CM | POA: Diagnosis not present

## 2022-05-07 DIAGNOSIS — Z7901 Long term (current) use of anticoagulants: Secondary | ICD-10-CM | POA: Diagnosis not present

## 2022-05-07 DIAGNOSIS — I739 Peripheral vascular disease, unspecified: Secondary | ICD-10-CM | POA: Diagnosis not present

## 2022-05-07 LAB — POCT INR: INR: 2.5 (ref 2.0–3.0)

## 2022-05-07 MED ORDER — CHANTIX STARTING MONTH PAK 0.5 MG X 11 & 1 MG X 42 PO TBPK: ORAL_TABLET | ORAL | 0 refills | Status: DC

## 2022-05-07 NOTE — Assessment & Plan Note (Signed)
Continue taking warfarin 5 mg every day except for Wednesday and Friday and on Wednesday and Friday take 3 mg warfarin.

## 2022-05-07 NOTE — Assessment & Plan Note (Signed)
Continue taking atorvastatin 10 mg daily.  LDL at goal.

## 2022-05-07 NOTE — Progress Notes (Signed)
BP 120/72   Pulse 97   Temp 98.2 F (36.8 C) (Oral)   Resp 18   Ht '5\' 4"'$  (1.626 m)   Wt 117 lb 1.6 oz (53.1 kg)   SpO2 98%   BMI 20.10 kg/m    Subjective:    Patient ID: Mary [name], female    DOB: [birth year], 72 y.o.   MRN: [id number]  HPI: Mary [name] is a 71 y.o. female  Chief Complaint  Patient presents with   Anticoagulation    4 week check   HTN: Her blood pressure today is 120/72.  Currently takes amlodipine 5 mg daily.  She denies any chest pain, shortness of breath, headaches or blurred vision. Continue with current treatment.   Hyperlipidemia/CAD: Her last LDL was 55 on 03/06/2022.  She is currently taking atorvastatin 10 mg daily.  She denies any myalgia.  We will continue with current treatment.  PAD/Coagulation therapy/ coagulation disorder: Her INR today is 2.5.  She is currently taking warfarin 5 mg every day except for Wednesday and Friday.  On Wednesday and Friday she is taking 3 mg of warfarin.  She has had multiple right lower extremity revascularizations by Dr. Dian Situ vascular surgery.  She has been on long-term warfarin therapy.  She denies any changes in her mentation, headaches or abnormal bleeding.  We will continue with current dose.   Tobacco dependence: She says she smokes about 1/2 pack a day.  She says she is ready to try and quit smoking ,She says she has tried the gum but it made her sick. Will try chantix.   Health Maintenance: Due for mammogram, was ordered on 02/24/2022 has not been scheduled,  card provided to patient to call for appointment.  Up to date on colonoscopy Up to date on labs  Relevant past medical, surgical, family and social history reviewed and updated as indicated. Interim medical history since our last visit reviewed. Allergies and medications reviewed and updated.  Review of Systems  Constitutional: Negative for fever or weight change.  Respiratory: Negative for cough and shortness of breath.   Cardiovascular: Negative  for chest pain or palpitations.  Gastrointestinal: Negative for abdominal pain, no bowel changes.  Musculoskeletal: Negative for gait problem or joint swelling.  Skin: Negative for rash.  Neurological: Negative for dizziness or headache.  No other specific complaints in a complete review of systems (except as listed in HPI above).      Objective:    BP 120/72   Pulse 97   Temp 98.2 F (36.8 C) (Oral)   Resp 18   Ht '5\' 4"'$  (1.626 m)   Wt 117 lb 1.6 oz (53.1 kg)   SpO2 98%   BMI 20.10 kg/m   Wt Readings from Last 3 Encounters:  05/07/22 117 lb 1.6 oz (53.1 kg)  04/06/22 116 lb 8 oz (52.8 kg)  03/06/22 115 lb 4.8 oz (52.3 kg)    Physical Exam  Constitutional: Patient appears well-developed and well-nourished.  No distress.  HEENT: head atraumatic, normocephalic, pupils equal and reactive to light,  neck supple Cardiovascular: Normal rate, regular rhythm and normal heart sounds.  No murmur heard. No BLE edema. Pulmonary/Chest: Effort normal and breath sounds normal. No respiratory distress. Abdominal: Soft.  There is no tenderness. Psychiatric: Patient has a normal mood and affect. behavior is normal. Judgment and thought content normal.  Results for orders placed or performed in visit on 05/07/22  POCT INR  Result Value Ref Range   INR  2.5 2.0 - 3.0      Assessment & Plan:   Problem List Items Addressed This Visit       Cardiovascular and Mediastinum   PAD (peripheral artery disease) ([place]) (Chronic)    Continue taking warfarin 5 mg every day except for Wednesday and Friday and on Wednesday and Friday take 3 mg warfarin.      Relevant Orders   POCT INR (Completed)   Essential hypertension - Primary    Continue taking amlodipine 5 mg daily.  Blood pressure at goal.      Coronary artery disease    Continue taking atorvastatin 10 mg daily.  LDL at goal.        Hematopoietic and Hemostatic   Coagulation disorder ([place])    Continue taking warfarin 5 mg every day  except for Wednesday and Friday and on Wednesday and Friday take 3 mg warfarin.      Relevant Orders   POCT INR (Completed)     Other   Tobacco abuse (Chronic)    She smokes about 1/2 pack a day. She says she is willing to try and stop smoking.  She says she has tried the gum and it made her sick. She would like to try chantix.       Relevant Medications   Varenicline Tartrate, Starter, (CHANTIX STARTING MONTH PAK) 0.5 MG X 11 & 1 MG X 42 TBPK   Mixed hyperlipidemia    Continue taking atorvastatin 10 mg daily.  LDL at goal.      Anticoagulated on Coumadin    Continue taking warfarin 5 mg every day except for Wednesday and Friday and on Wednesday and Friday take 3 mg warfarin.      Relevant Orders   POCT INR (Completed)     Follow up plan: Return in about 4 weeks (around 06/04/2022) for follow up.

## 2022-05-07 NOTE — Assessment & Plan Note (Signed)
Continue taking amlodipine 5 mg daily.  Blood pressure at goal.

## 2022-05-07 NOTE — Assessment & Plan Note (Signed)
She smokes about 1/2 pack a day. She says she is willing to try and stop smoking.  She says she has tried the gum and it made her sick. She would like to try chantix.

## 2022-05-20 DIAGNOSIS — L729 Follicular cyst of the skin and subcutaneous tissue, unspecified: Secondary | ICD-10-CM | POA: Diagnosis not present

## 2022-05-20 DIAGNOSIS — L821 Other seborrheic keratosis: Secondary | ICD-10-CM | POA: Diagnosis not present

## 2022-05-28 ENCOUNTER — Other Ambulatory Visit: Payer: Self-pay | Admitting: Nurse Practitioner

## 2022-05-28 DIAGNOSIS — I739 Peripheral vascular disease, unspecified: Secondary | ICD-10-CM

## 2022-05-28 DIAGNOSIS — Z7901 Long term (current) use of anticoagulants: Secondary | ICD-10-CM

## 2022-05-28 DIAGNOSIS — D689 Coagulation defect, unspecified: Secondary | ICD-10-CM

## 2022-05-28 NOTE — Telephone Encounter (Signed)
Requested medications are due for refill today.  unsure  Requested medications are on the active medications list.  yes  Last refill. 03/06/2022 330 0 rf  Future visit scheduled.   yes  Notes to clinic.  Not delegated. PT has rx's for 2 different warfarin doses.    Requested Prescriptions  Pending Prescriptions Disp Refills   warfarin (COUMADIN) 3 MG tablet [Pharmacy Med Name: Warfarin Sodium 3 MG Oral Tablet] 30 tablet 0    Sig: Take 1 tablet by mouth once daily     Hematology:  Anticoagulants - warfarin Failed - 05/28/2022 10:22 AM      Failed - Manual Review: If patient's warfarin is managed by Anti-Coag team, route request to them. If not, route request to the provider.      Passed - INR in normal range and within 30 days    INR  Date Value Ref Range Status  05/07/2022 2.5 2.0 - 3.0 Final    Comment:    30.1 PT  09/29/2021 1.6 (H) 0.8 - 1.2 Final    Comment:    (NOTE) INR goal varies based on device and disease states. Performed at [facility], [address] [place], [place] [address]   10/26/2011 2.0  Final    Comment:    INR reference interval applies to patients on anticoagulant therapy. A single INR therapeutic range for coumarins is not optimal for all indications; however, the suggested range for most indications is 2.0 - 3.0. Exceptions to the INR Reference Range may include: Prosthetic heart valves, acute myocardial infarction, prevention of myocardial infarction, and combinations of aspirin and anticoagulant. The need for a higher or lower target INR must be assessed individually. Reference: The Pharmacology and Management of the Vitamin K  antagonists: the seventh ACCP Conference on Antithrombotic and Thrombolytic Therapy. PNTIR.4431 Sept:126 (3suppl): N9146842. A HCT value >55% may artifactually increase the PT.  In one study,  the increase was an average of 25%. Reference:  "Effect on Routine and Special Coagulation Testing Values of  Citrate Anticoagulant Adjustment in Patients with High HCT Values." American Journal of Clinical Pathology 2006;126:400-405.          Passed - HCT in normal range and within 360 days    HCT  Date Value Ref Range Status  03/06/2022 38.3 35.0 - 45.0 % Final   Hematocrit  Date Value Ref Range Status  10/17/2015 39.3 34.0 - 46.6 % Final         Passed - Patient is not pregnant      Passed - Valid encounter within last 3 months    Recent Outpatient Visits           3 weeks ago Essential hypertension   [place], FNP   1 month ago Anticoagulated on Coumadin   [place], FNP   2 months ago Anticoagulated on Coumadin   [facility] Bo Merino, FNP   3 months ago Encounter for [employer] annual wellness exam   [place], PA-C   3 months ago Viral URI   [facility] Myles Gip, DO       Future Appointments             In 1 week Reece Packer, Myna Hidalgo, [facility], [place]   In 9 months Reece Packer, Myna Hidalgo, [facility], [facility]

## 2022-05-30 DIAGNOSIS — I10 Essential (primary) hypertension: Secondary | ICD-10-CM

## 2022-05-30 DIAGNOSIS — E785 Hyperlipidemia, unspecified: Secondary | ICD-10-CM

## 2022-05-30 DIAGNOSIS — F17210 Nicotine dependence, cigarettes, uncomplicated: Secondary | ICD-10-CM

## 2022-06-08 ENCOUNTER — Ambulatory Visit (INDEPENDENT_AMBULATORY_CARE_PROVIDER_SITE_OTHER): Payer: Medicare PPO | Admitting: Nurse Practitioner

## 2022-06-08 ENCOUNTER — Other Ambulatory Visit: Payer: Self-pay

## 2022-06-08 ENCOUNTER — Encounter: Payer: Self-pay | Admitting: Nurse Practitioner

## 2022-06-08 VITALS — BP 118/72 | HR 68 | Temp 98.6°F | Resp 16 | Ht 64.0 in | Wt 118.4 lb

## 2022-06-08 DIAGNOSIS — Z7901 Long term (current) use of anticoagulants: Secondary | ICD-10-CM

## 2022-06-08 DIAGNOSIS — Z720 Tobacco use: Secondary | ICD-10-CM

## 2022-06-08 DIAGNOSIS — I739 Peripheral vascular disease, unspecified: Secondary | ICD-10-CM | POA: Diagnosis not present

## 2022-06-08 DIAGNOSIS — D689 Coagulation defect, unspecified: Secondary | ICD-10-CM

## 2022-06-08 LAB — POCT INR: INR: 2.9 (ref 2.0–3.0)

## 2022-06-08 MED ORDER — NICOTINE 21 MG/24HR TD PT24: 21.0000 mg | MEDICATED_PATCH | Freq: Every day | TRANSDERMAL | 0 refills | Status: DC

## 2022-06-08 NOTE — Assessment & Plan Note (Signed)
Continue taking warfarin 5 mg every day except for Wednesday and Friday and on Wednesday and Friday take 3 mg warfarin.

## 2022-06-08 NOTE — Assessment & Plan Note (Signed)
Unable to tolerate chantix.  She also does not tolerate nicotine gum.  She is willing to try the nicotine patch.

## 2022-06-08 NOTE — Progress Notes (Signed)
BP 118/72   Pulse 68   Temp 98.6 F (37 C) (Oral)   Resp 16   Ht '5\' 4"'$  (1.626 m)   Wt 118 lb 6.4 oz (53.7 kg)   SpO2 97%   BMI 20.32 kg/m    Subjective:    Patient ID: Mary [name], female    DOB: [birth year], 71 y.o.   MRN: [id number]  HPI: Mary [name] is a 71 y.o. female  Chief Complaint  Patient presents with   Anticoagulation    4 week recheck   PAD/Coagulation therapy/ coagulation disorder: Her INR on 05/07/2022 was 2.5. Today her INR is 2.9.  She is currently taking warfarin 5 mg every day except for Wednesday and Friday.  On Wednesday and Friday she takes 3 mg warfarin.  She has had multiple right lower extremity revascularizations by Dr. Dian Situ.  He is her vascular surgeon.  She has been on long-term warfarin therapy.  She denies any headaches, changes in mentation, or abnormal bleeding.   Tobacco dependence: Last appointment patient verbalized desire to quit smoking.  We started her on Chantix.  Patient was smoking about half a pack a day. She says that she did not tolerate the chantix.  She is willing to try nicotine patch.   Health Maintenance: Due for mammogram, was ordered on 02/24/2022 has not been scheduled,  card provided to patient to call for appointment.  Up to date on colonoscopy Up to date on labs  Relevant past medical, surgical, family and social history reviewed and updated as indicated. Interim medical history since our last visit reviewed. Allergies and medications reviewed and updated.  Review of Systems  Constitutional: Negative for fever or weight change.  Respiratory: Negative for cough and shortness of breath.   Cardiovascular: Negative for chest pain or palpitations.  Gastrointestinal: Negative for abdominal pain, no bowel changes.  Musculoskeletal: Negative for gait problem or joint swelling.  Skin: Negative for rash.  Neurological: Negative for dizziness or headache.  No other specific complaints in a complete review of systems (except as  listed in HPI above).      Objective:    BP 118/72   Pulse 68   Temp 98.6 F (37 C) (Oral)   Resp 16   Ht '5\' 4"'$  (1.626 m)   Wt 118 lb 6.4 oz (53.7 kg)   SpO2 97%   BMI 20.32 kg/m   Wt Readings from Last 3 Encounters:  06/08/22 118 lb 6.4 oz (53.7 kg)  05/07/22 117 lb 1.6 oz (53.1 kg)  04/06/22 116 lb 8 oz (52.8 kg)    Physical Exam  Constitutional: Patient appears well-developed and well-nourished.  No distress.  HEENT: head atraumatic, normocephalic, pupils equal and reactive to light,  neck supple Cardiovascular: Normal rate, regular rhythm and normal heart sounds.  No murmur heard. No BLE edema. Pulmonary/Chest: Effort normal and breath sounds normal. No respiratory distress. Abdominal: Soft.  There is no tenderness. Psychiatric: Patient has a normal mood and affect. behavior is normal. Judgment and thought content normal.  Results for orders placed or performed in visit on 06/08/22  POCT INR  Result Value Ref Range   INR 2.9 2.0 - 3.0      Assessment & Plan:   Problem List Items Addressed This Visit       Cardiovascular and Mediastinum   PAD (peripheral artery disease) ([place]) - Primary (Chronic)    Continue taking warfarin 5 mg every day except for Wednesday and Friday and on Wednesday  and Friday take 3 mg warfarin.       Relevant Orders   POCT INR (Completed)     Hematopoietic and Hemostatic   Coagulation disorder ([place])    Continue taking warfarin 5 mg every day except for Wednesday and Friday and on Wednesday and Friday take 3 mg warfarin.       Relevant Orders   POCT INR (Completed)     Other   Tobacco abuse (Chronic)    Unable to tolerate chantix.  She also does not tolerate nicotine gum.  She is willing to try the nicotine patch.       Relevant Medications   nicotine (NICODERM CQ - DOSED IN MG/24 HOURS) 21 mg/24hr patch   Anticoagulated on Coumadin    Continue taking warfarin 5 mg every day except for Wednesday and Friday and on Wednesday and  Friday take 3 mg warfarin.         Follow up plan: Return in about 4 weeks (around 07/06/2022) for follow up.

## 2022-07-06 ENCOUNTER — Other Ambulatory Visit: Payer: Self-pay

## 2022-07-06 ENCOUNTER — Ambulatory Visit (INDEPENDENT_AMBULATORY_CARE_PROVIDER_SITE_OTHER): Payer: Medicare PPO | Admitting: Nurse Practitioner

## 2022-07-06 ENCOUNTER — Encounter: Payer: Self-pay | Admitting: Nurse Practitioner

## 2022-07-06 VITALS — BP 118/82 | HR 72 | Temp 98.6°F | Resp 16 | Ht 64.0 in | Wt 118.4 lb

## 2022-07-06 DIAGNOSIS — I739 Peripheral vascular disease, unspecified: Secondary | ICD-10-CM | POA: Diagnosis not present

## 2022-07-06 DIAGNOSIS — Z7901 Long term (current) use of anticoagulants: Secondary | ICD-10-CM

## 2022-07-06 DIAGNOSIS — D689 Coagulation defect, unspecified: Secondary | ICD-10-CM

## 2022-07-06 DIAGNOSIS — Z720 Tobacco use: Secondary | ICD-10-CM

## 2022-07-06 LAB — POCT INR
INR: 2.5 (ref 2.0–3.0)
POC INR: 30.8

## 2022-07-06 MED ORDER — WARFARIN SODIUM 5 MG PO TABS: ORAL_TABLET | ORAL | 1 refills | Status: DC

## 2022-07-06 MED ORDER — WARFARIN SODIUM 3 MG PO TABS: ORAL_TABLET | ORAL | 1 refills | Status: DC

## 2022-07-06 NOTE — Assessment & Plan Note (Signed)
She has not started nicotine patch yet. She says she has not picked it up yet.  She says that she is going to check and see how much money it is and will start it.

## 2022-07-06 NOTE — Assessment & Plan Note (Signed)
INR is 2.5 today Continue taking warfarin 5 mg every day except for Wednesdays and Fridays.  On Wednesday and Friday she takes 3 mg warfarin.

## 2022-07-06 NOTE — Progress Notes (Addendum)
BP 118/82   Pulse 72   Temp 98.6 F (37 C) (Oral)   Resp 16   Ht '5\' 4"'$  (1.626 m)   Wt 118 lb 6.4 oz (53.7 kg)   SpO2 98%   BMI 20.32 kg/m    Subjective:    Patient ID: Mary [name], female    DOB: [birth year], 71 y.o.   MRN: [id number]  HPI: Mary [name] is a 71 y.o. female  Chief Complaint  Patient presents with   Anticoagulation    4 week recheck   PAD/Coagulation therapy/ coagulation disorder: Her INR on 06/08/2022 was 2.9.  Today it is 2.5.  She currently takes warfarin 5 mg every day except for Wednesdays and Fridays.  On Wednesday and Friday she takes 3 mg warfarin.  She has a history of multiple right lower extremity revascularizations by Dr. Dian Situ her surgeon.  She has been on long-term warfarin therapy.  She denies any headaches, changes in mentation, abnormal bleeding. She is therapeutic and will continue with current treatment plan. Patient reports she has a lot going on with family members and can not come every four weeks. She says she would like to extend her appointment to 3 months. Discussed that we can try it and may need to change it if her INR does not continue to be stable.    Tobacco dependence: Last appointment we started patient on nicotine patch.  Patient reports she has not started yet.  But she is going to go see how much they area.  Health Maintenance: Due for mammogram, was ordered on 02/24/2022 has not been scheduled,  card provided to patient to call for appointment.  Patient still has not gotten mammogram scheduled.  Up to date on colonoscopy Up to date on labs  Relevant past medical, surgical, family and social history reviewed and updated as indicated. Interim medical history since our last visit reviewed. Allergies and medications reviewed and updated.  Review of Systems  Constitutional: Negative for fever or weight change.  Respiratory: Negative for cough and shortness of breath.   Cardiovascular: Negative for chest pain or palpitations.   Gastrointestinal: Negative for abdominal pain, no bowel changes.  Musculoskeletal: Negative for gait problem or joint swelling.  Skin: Negative for rash.  Neurological: Negative for dizziness or headache.  No other specific complaints in a complete review of systems (except as listed in HPI above).      Objective:    BP 118/82   Pulse 72   Temp 98.6 F (37 C) (Oral)   Resp 16   Ht '5\' 4"'$  (1.626 m)   Wt 118 lb 6.4 oz (53.7 kg)   SpO2 98%   BMI 20.32 kg/m   Wt Readings from Last 3 Encounters:  07/06/22 118 lb 6.4 oz (53.7 kg)  06/08/22 118 lb 6.4 oz (53.7 kg)  05/07/22 117 lb 1.6 oz (53.1 kg)    Physical Exam  Constitutional: Patient appears well-developed and well-nourished.  No distress.  HEENT: head atraumatic, normocephalic, pupils equal and reactive to light,  neck supple Cardiovascular: Normal rate, regular rhythm and normal heart sounds.  No murmur heard. No BLE edema. Pulmonary/Chest: Effort normal and breath sounds normal. No respiratory distress. Abdominal: Soft.  There is no tenderness. Psychiatric: Patient has a normal mood and affect. behavior is normal. Judgment and thought content normal.   Results for orders placed or performed in visit on 07/06/22  POCT INR  Result Value Ref Range   INR 2.5 2.0 - 3.0  POC INR 30.8       Assessment & Plan:   Problem List Items Addressed This Visit       Cardiovascular and Mediastinum   PAD (peripheral artery disease) ([place]) - Primary (Chronic)    INR is 2.5 today Continue taking warfarin 5 mg every day except for Wednesdays and Fridays.  On Wednesday and Friday she takes 3 mg warfarin.       Relevant Medications   warfarin (COUMADIN) 5 MG tablet   warfarin (COUMADIN) 3 MG tablet   Other Relevant Orders   POCT INR (Completed)     Hematopoietic and Hemostatic   Coagulation disorder ([facility])    INR is 2.5 today Continue taking warfarin 5 mg every day except for Wednesdays and Fridays.  On Wednesday and Friday she  takes 3 mg warfarin.       Relevant Medications   warfarin (COUMADIN) 5 MG tablet   warfarin (COUMADIN) 3 MG tablet   Other Relevant Orders   POCT INR (Completed)     Other   Tobacco abuse (Chronic)    She has not started nicotine patch yet. She says she has not picked it up yet.  She says that she is going to check and see how much money it is and will start it.       Anticoagulated on Coumadin    INR is 2.5 today Continue taking warfarin 5 mg every day except for Wednesdays and Fridays.  On Wednesday and Friday she takes 3 mg warfarin.       Relevant Medications   warfarin (COUMADIN) 5 MG tablet   warfarin (COUMADIN) 3 MG tablet   Other Relevant Orders   POCT INR (Completed)     Follow up plan: Return in about 3 months (around 10/06/2022) for follow up.

## 2022-09-01 ENCOUNTER — Ambulatory Visit
Admission: RE | Admit: 2022-09-01 | Discharge: 2022-09-01 | Disposition: A | Discharge status: Home / Self Care | Payer: Medicare PPO | Source: Ambulatory Visit | Attending: Physician Assistant | Admitting: Physician Assistant

## 2022-09-01 DIAGNOSIS — Z1231 Encounter for screening mammogram for malignant neoplasm of breast: Secondary | ICD-10-CM | POA: Insufficient documentation

## 2022-09-07 ENCOUNTER — Other Ambulatory Visit: Payer: Self-pay | Admitting: Nurse Practitioner

## 2022-09-07 DIAGNOSIS — I739 Peripheral vascular disease, unspecified: Secondary | ICD-10-CM

## 2022-09-07 DIAGNOSIS — Z7901 Long term (current) use of anticoagulants: Secondary | ICD-10-CM

## 2022-09-07 DIAGNOSIS — D689 Coagulation defect, unspecified: Secondary | ICD-10-CM

## 2022-09-08 NOTE — Telephone Encounter (Signed)
Requested medication (s) are due for refill today: yes  Requested medication (s) are on the active medication list: yes  Last refill:  07/06/22 #24 1 RF  Future visit scheduled: yes  Notes to clinic:  Routing to provider   Requested Prescriptions  Pending Prescriptions Disp Refills   warfarin (COUMADIN) 3 MG tablet [Pharmacy Med Name: Warfarin Sodium 3 MG Oral Tablet] 30 tablet 0    Sig: Take 1 tablet by mouth once daily     Hematology:  Anticoagulants - warfarin Failed - 09/07/2022 11:36 AM      Failed - Manual Review: If patient's warfarin is managed by Anti-Coag team, route request to them. If not, route request to the provider.      Failed - INR in normal range and within 30 days    POC INR  Date Value Ref Range Status  07/06/2022 30.8  Final   INR  Date Value Ref Range Status  07/06/2022 2.5 2.0 - 3.0 Final         Passed - HCT in normal range and within 360 days    HCT  Date Value Ref Range Status  03/06/2022 38.3 35.0 - 45.0 % Final   Hematocrit  Date Value Ref Range Status  10/17/2015 39.3 34.0 - 46.6 % Final         Passed - Patient is not pregnant      Passed - Valid encounter within last 3 months    Recent Outpatient Visits           2 months ago PAD (peripheral artery disease) [facility])   [facility] Serafina Royals F, FNP   3 months ago PAD (peripheral artery disease) [facility])   [facility] Bo Merino, FNP   4 months ago Essential hypertension   [place], FNP   5 months ago Anticoagulated on Coumadin   [place], FNP   6 months ago Anticoagulated on Coumadin   [facility] Bo Merino, FNP       Future Appointments             In 4 weeks Reece Packer, Myna Hidalgo, [facility], [place]   In 5 months Reece Packer, Myna Hidalgo, [facility], [facility]

## 2022-10-06 ENCOUNTER — Ambulatory Visit: Payer: Medicare PPO | Admitting: Nurse Practitioner

## 2022-10-06 ENCOUNTER — Ambulatory Visit (INDEPENDENT_AMBULATORY_CARE_PROVIDER_SITE_OTHER): Payer: Medicare PPO | Admitting: Family Medicine

## 2022-10-06 ENCOUNTER — Other Ambulatory Visit: Payer: Self-pay | Admitting: Family Medicine

## 2022-10-06 ENCOUNTER — Encounter: Payer: Self-pay | Admitting: Family Medicine

## 2022-10-06 VITALS — BP 132/74 | HR 100 | Temp 98.0°F | Resp 16 | Ht 64.0 in | Wt 122.5 lb

## 2022-10-06 DIAGNOSIS — Z716 Tobacco abuse counseling: Secondary | ICD-10-CM

## 2022-10-06 DIAGNOSIS — I10 Essential (primary) hypertension: Secondary | ICD-10-CM

## 2022-10-06 DIAGNOSIS — I2510 Atherosclerotic heart disease of native coronary artery without angina pectoris: Secondary | ICD-10-CM | POA: Diagnosis not present

## 2022-10-06 DIAGNOSIS — E782 Mixed hyperlipidemia: Secondary | ICD-10-CM | POA: Diagnosis not present

## 2022-10-06 DIAGNOSIS — Z23 Encounter for immunization: Secondary | ICD-10-CM | POA: Diagnosis not present

## 2022-10-06 DIAGNOSIS — Z720 Tobacco use: Secondary | ICD-10-CM | POA: Diagnosis not present

## 2022-10-06 DIAGNOSIS — E790 Hyperuricemia without signs of inflammatory arthritis and tophaceous disease: Secondary | ICD-10-CM | POA: Diagnosis not present

## 2022-10-06 DIAGNOSIS — Z7901 Long term (current) use of anticoagulants: Secondary | ICD-10-CM

## 2022-10-06 DIAGNOSIS — L989 Disorder of the skin and subcutaneous tissue, unspecified: Secondary | ICD-10-CM

## 2022-10-06 DIAGNOSIS — Z8739 Personal history of other diseases of the musculoskeletal system and connective tissue: Secondary | ICD-10-CM

## 2022-10-06 DIAGNOSIS — I739 Peripheral vascular disease, unspecified: Secondary | ICD-10-CM

## 2022-10-06 DIAGNOSIS — Z5181 Encounter for therapeutic drug level monitoring: Secondary | ICD-10-CM | POA: Diagnosis not present

## 2022-10-06 DIAGNOSIS — D689 Coagulation defect, unspecified: Secondary | ICD-10-CM

## 2022-10-06 DIAGNOSIS — Z780 Asymptomatic menopausal state: Secondary | ICD-10-CM

## 2022-10-06 DIAGNOSIS — E162 Hypoglycemia, unspecified: Secondary | ICD-10-CM | POA: Diagnosis not present

## 2022-10-06 MED ORDER — NICOTINE 7 MG/24HR TD PT24: 7.0000 mg | MEDICATED_PATCH | Freq: Every day | TRANSDERMAL | 2 refills | Status: DC

## 2022-10-06 MED ORDER — AMLODIPINE BESYLATE 5 MG PO TABS: 5.0000 mg | ORAL_TABLET | Freq: Every day | ORAL | 1 refills | Status: DC

## 2022-10-06 MED ORDER — WARFARIN SODIUM 5 MG PO TABS: ORAL_TABLET | ORAL | 1 refills | Status: DC

## 2022-10-06 MED ORDER — WARFARIN SODIUM 3 MG PO TABS: ORAL_TABLET | ORAL | 0 refills | Status: DC

## 2022-10-06 NOTE — Assessment & Plan Note (Addendum)
No change to baseline symptoms, she has been lost to f/up with vascular specialist, last f/up in 2021 - she states she will call and make appt - I suspect she will need more recent referral for insurance She is on coumadin and statin with last lipids adequately controlled Still smoking, BP well controlled

## 2022-10-06 NOTE — Assessment & Plan Note (Signed)
Well controlled, compliant with meds, no SE or concerning sx Pt reports good complicance to norvasc 5 mg Pt encouraged to continue to work on healthy diet (low salt) and lifestyle for improving HTN management - DASH diet handout offered today

## 2022-10-06 NOTE — Assessment & Plan Note (Signed)
Only one flare in the past year treated with OTC black cherry Not needing OV or rx to manage

## 2022-10-06 NOTE — Assessment & Plan Note (Signed)
No concerning sx or SE of anticoagulation - due to f/up with vascular

## 2022-10-06 NOTE — Assessment & Plan Note (Signed)
With associated PAD s/p stent and CAD Compliant with meds, no SE, no myalgias, fatigue or jaundice

## 2022-10-06 NOTE — Progress Notes (Signed)
Name: Mary [name]   MRN: [id number]    DOB: 01-25-1951   Date:10/06/2022       Progress Note  Chief Complaint  Patient presents with   Anticoagulation     Subjective:   Mary [name] is a 72 y.o. female, presents to clinic for routine f/up and PT/INR check  HTN managed on amlodipine 5 mg BP initially elevated today in clinic, improved with recheck to 132/74 BP Readings from Last 3 Encounters:  10/06/22 (!) 140/78  07/06/22 118/82  06/08/22 118/72   HLD on lipitor 10 mg Last lipids done about 6 months ago and well controlled Has PAD/CAD seeing vascular specialists - looks like the last f/up and ABI with with vascular may have been in 2021 - seeing [facility], last [place] [facility] ABI August 2021 - she will call them to make an appt No change to her baseline sx since stent in 2011  Current smoker - lung cancer screening- discussed using nicotine patch and smoking cessation, she is willing to try lower dose nicotine patch and will reduce cigarettes per day, currently 1/2 ppd, will try to go down to 3-5 cig /day She was given 21 mg patch and never tried it - stating dose was too high  Having some stress taking care of her husband with poor health  Hx of gout - about 1 episode in the past year, uric acid elevated in the past but that was many years ago - no on medications or needing OV for flares     Current Outpatient Medications:    acetaminophen (TYLENOL) 500 MG tablet, Take 500 mg by mouth 2 (two) times daily as needed for moderate pain or headache., Disp: , Rfl:    amLODipine (NORVASC) 5 MG tablet, TAKE 1 TABLET EVERY DAY, Disp: 90 tablet, Rfl: 1   atorvastatin (LIPITOR) 10 MG tablet, TAKE 1 TABLET AT BEDTIME, Disp: 90 tablet, Rfl: 3   cholecalciferol (VITAMIN D3) 25 MCG (1000 UNIT) tablet, Take 2 tablets (2,000 Units total) by mouth daily., Disp: , Rfl:    nicotine (NICODERM CQ - DOSED IN MG/24 HOURS) 21 mg/24hr patch, Place 1 patch (21 mg total) onto the skin daily., Disp: 28  patch, Rfl: 0   vitamin E 400 UNIT capsule, Take 180 Units by mouth daily. , Disp: , Rfl:    warfarin (COUMADIN) 3 MG tablet, Take 1 tablet by mouth once daily, Disp: 30 tablet, Rfl: 0   warfarin (COUMADIN) 5 MG tablet, Take 1 tab (5 mg) PO once daily every day Monday, Tuesday, Thursday, Saturday, and Sunday, Disp: 60 tablet, Rfl: 1  Patient Active Problem List   Diagnosis Date Noted   Coagulation disorder ([place]) 01/01/2022   Anticoagulated on Coumadin 02/05/2020   History of gout 06/23/2019   Coronary artery disease 02/16/2019   Benign neoplasm of cecum    Benign neoplasm of transverse colon    Polyp of sigmoid colon    External hemorrhoids    Hip arthritis 10/27/2017   Breast lump on left side at 8 o'clock position 07/08/2017   Breast lump on right side at 7 o'clock position 11/14/2015   Encounter for monitoring Coumadin therapy 02/27/2015   PAD (peripheral artery disease) ([place])    Essential hypertension    Mixed hyperlipidemia    Tobacco abuse    Hyperuricemia     Past Surgical History:  Procedure Laterality Date   ANGIOPLASTY  July 2011   PAD- popliteal artery   COLONOSCOPY WITH PROPOFOL N/A  01/13/2018   Procedure: COLONOSCOPY WITH PROPOFOL;  Surgeon: Lin Landsman, MD;  Location: [facility] ENDOSCOPY;  Service: Gastroenterology;  Laterality: N/A;   COLONOSCOPY WITH PROPOFOL N/A 01/14/2018   Procedure: COLONOSCOPY WITH PROPOFOL;  Surgeon: Virgel Manifold, MD;  Location: [facility] ENDOSCOPY;  Service: Endoscopy;  Laterality: N/A;   COLONOSCOPY WITH PROPOFOL N/A 07/06/2018   Procedure: COLONOSCOPY WITH PROPOFOL;  Surgeon: Virgel Manifold, MD;  Location: [facility] ENDOSCOPY;  Service: Endoscopy;  Laterality: N/A;   CORONARY ANGIOPLASTY     JOINT REPLACEMENT     Leg Bypass Surgery Right 08/2010   OVARIAN CYST SURGERY  1980s   POPLITEAL ARTERY STENT Right 05/2010   POPLITEAL ARTERY STENT Right 07/2010   TOTAL HIP ARTHROPLASTY Left 10/27/2017   Procedure: TOTAL HIP ARTHROPLASTY;   Surgeon: Earnestine Leys, MD;  Location: [facility] ORS;  Service: Orthopedics;  Laterality: Left;    Family History  Problem Relation Age of Onset   Arthritis Mother    Diabetes Mother    Stroke Mother    Hypertension Mother    Dementia Mother    Cancer Father    Arthritis Sister    Seizures Sister    Diabetes Sister    Hypertension Sister    COPD Sister    Cancer Brother    Stroke Maternal Grandmother    Diabetes Paternal Grandmother    Cancer Sister    Diabetes Sister    Arthritis Sister    Diabetes Brother    Stroke Brother    Healthy Brother    Diabetes Brother    Stroke Brother    Cancer Brother    Heart disease Neg Hx    Breast cancer Neg Hx     Social History   Tobacco Use   Smoking status: Every Day    Packs/day: 0.50    Years: 40.00    Total pack years: 20.00    Types: Cigarettes   Smokeless tobacco: Never  Vaping Use   Vaping Use: Never used  Substance Use Topics   Alcohol use: No   Drug use: No     No Known Allergies  Health Maintenance  Topic Date Due   Lung Cancer Screening  Never done   COVID-19 Vaccine (4 - 2023-24 season) 10/22/2022 (Originally 05/01/2022)   INFLUENZA VACCINE  11/29/2022 (Originally 03/31/2022)   Zoster Vaccines- Shingrix (1 of 2) 01/04/2023 (Originally 04/08/2003)   DEXA SCAN  02/25/2023 (Originally 04-12-1951)   Pneumonia Vaccine 18+ Years old (1 - PCV) 05/08/2023 (Originally 04/08/1957)   Medicare Annual Wellness (AWV)  02/25/2023   COLONOSCOPY (Pts 45-49yr Insurance coverage will need to be confirmed)  07/07/2023   MAMMOGRAM  09/02/2023   DTaP/Tdap/Td (2 - Td or Tdap) 09/28/2027   Hepatitis C Screening  Completed   HPV VACCINES  Aged Out    Chart Review Today: I personally reviewed active problem list, medication list, allergies, family history, social history, health maintenance, notes from last encounter, lab results, imaging with the patient/caregiver today.   Review of Systems  Constitutional: Negative.   HENT:  Negative.    Eyes: Negative.   Respiratory: Negative.    Cardiovascular: Negative.   Gastrointestinal: Negative.   Endocrine: Negative.   Genitourinary: Negative.   Musculoskeletal: Negative.   Skin: Negative.   Allergic/Immunologic: Negative.   Neurological: Negative.   Hematological: Negative.   Psychiatric/Behavioral: Negative.    All other systems reviewed and are negative.    Objective:   Vitals:   10/06/22 0948 10/06/22 1005  BP: (Marland Kitchen  140/78 132/74  Pulse: 100   Resp: 16   Temp: 98 F (36.7 C)   TempSrc: Oral   SpO2: 98%   Weight: 122 lb 8 oz (55.6 kg)   Height: '5\' 4"'$  (1.626 m)     Body mass index is 21.03 kg/m.  Physical Exam Vitals and nursing note reviewed.  Constitutional:      General: She is not in acute distress.    Appearance: Normal appearance. She is normal weight. She is not ill-appearing, toxic-appearing or diaphoretic.  HENT:     Head: Normocephalic and atraumatic.     Right Ear: External ear normal.     Left Ear: External ear normal.  Eyes:     General: No scleral icterus.       Right eye: No discharge.        Left eye: No discharge.     Conjunctiva/sclera: Conjunctivae normal.  Cardiovascular:     Rate and Rhythm: Normal rate and regular rhythm.     Heart sounds: No murmur heard.    No friction rub. No gallop.  Pulmonary:     Effort: Pulmonary effort is normal. No respiratory distress.     Breath sounds: Normal breath sounds. No wheezing, rhonchi or rales.  Abdominal:     General: Bowel sounds are normal.     Palpations: Abdomen is soft.  Musculoskeletal:        General: Normal range of motion.     Cervical back: Normal range of motion. No rigidity.  Skin:    General: Skin is warm and dry.     Findings: Lesion (right upper back x 2) present.  Neurological:     Mental Status: She is alert. Mental status is at baseline.     Gait: Gait normal.  Psychiatric:        Mood and Affect: Mood normal.        Behavior: Behavior normal.          Assessment & Plan:   Problem List Items Addressed This Visit       Cardiovascular and Mediastinum   PAD (peripheral artery disease) (HCC) (Chronic)    No change to baseline symptoms, she has been lost to f/up with vascular specialist, last f/up in 2021 - she states she will call and make appt - I suspect she will need more recent referral for insurance She is on coumadin and statin with last lipids adequately controlled Still smoking, BP well controlled      Relevant Medications   warfarin (COUMADIN) 5 MG tablet   warfarin (COUMADIN) 3 MG tablet   amLODipine (NORVASC) 5 MG tablet   Other Relevant Orders   CBC with Differential/Platelet   COMPLETE METABOLIC PANEL WITH GFR   Protime-INR   Essential hypertension    Well controlled, compliant with meds, no SE or concerning sx Pt reports good complicance to norvasc 5 mg Pt encouraged to continue to work on healthy diet (low salt) and lifestyle for improving HTN management - DASH diet handout offered today       Relevant Medications   warfarin (COUMADIN) 5 MG tablet   warfarin (COUMADIN) 3 MG tablet   amLODipine (NORVASC) 5 MG tablet   Other Relevant Orders   COMPLETE METABOLIC PANEL WITH GFR   Coronary artery disease    Denies any exertional sx, BP and lipids well controlled, encouraged smoking cessation, no cardiology consult in the past several years      Relevant Medications   warfarin (COUMADIN) 5  MG tablet   warfarin (COUMADIN) 3 MG tablet   amLODipine (NORVASC) 5 MG tablet     Hematopoietic and Hemostatic   Coagulation disorder (HCC)    No concerning sx or SE of anticoagulation - due to f/up with vascular      Relevant Medications   warfarin (COUMADIN) 5 MG tablet   warfarin (COUMADIN) 3 MG tablet   Other Relevant Orders   CBC with Differential/Platelet   Protime-INR     Other   Tobacco abuse (Chronic)    Smoking cessation and lung cancer screening discussed for more than 5 min today Smoking  cessation instruction/counseling given:  counseled patient on the dangers of tobacco use, advised patient to stop smoking, and reviewed strategies to maximize success Plan to start lower dose nicotine patch and then reduce cig amount per day to 5 for a few weeks then reduce to 3 cig a day - she has goal to get to smoking 1 pack per week Currently not interested in the CT for lung cancer screening        Relevant Orders   CBC with Differential/Platelet   Hyperuricemia (Chronic)    Mild sx, using OTC meds to manage, will not recheck labs - however is sx worsening or progress rechecking uric acid and Rx management would be appropriate       Relevant Orders   COMPLETE METABOLIC PANEL WITH GFR   Mixed hyperlipidemia - Primary    With associated PAD s/p stent and CAD Compliant with meds, no SE, no myalgias, fatigue or jaundice        Relevant Medications   warfarin (COUMADIN) 5 MG tablet   warfarin (COUMADIN) 3 MG tablet   amLODipine (NORVASC) 5 MG tablet   Other Relevant Orders   COMPLETE METABOLIC PANEL WITH GFR   Lipid panel   History of gout    Only one flare in the past year treated with OTC black cherry Not needing OV or rx to manage      Relevant Orders   COMPLETE METABOLIC PANEL WITH GFR   Anticoagulated on Coumadin   Relevant Medications   warfarin (COUMADIN) 5 MG tablet   warfarin (COUMADIN) 3 MG tablet   Other Relevant Orders   CBC with Differential/Platelet   Protime-INR   Other Visit Diagnoses     Hypoglycemia       last labs showed glucose in 50's, recheck with cmp   Relevant Orders   COMPLETE METABOLIC PANEL WITH GFR   Hemoglobin A1c   Encounter for medication monitoring       Relevant Orders   CBC with Differential/Platelet   COMPLETE METABOLIC PANEL WITH GFR   Hemoglobin A1c   Protime-INR   Lipid panel   Need for pneumococcal vaccination       strongly recommended with age and current smoker   Relevant Orders   Pneumococcal conjugate vaccine  20-valent (Completed)   Postmenopausal estrogen deficiency       due for dexa, ordered, pt previously declined - explained is use for risk assessment for fracture and to help with prevention, ordered, pt can call and schedule   Relevant Orders   DG Bone Density   Need for shingles vaccine       sent to pharmacy, reviewed with pt and encouraged her to consider getting vaccine, reviewed effectiveness and shingles outbreak sx   Encounter for current long-term use of anticoagulants       pt has not had PT/INR done for about 3  months, previously was getting done monthly and last PT/INR x 2 therapeutic with 5 mg 5d/wk 3 mg 2d/wk   Encounter for smoking cessation counseling       Relevant Medications   nicotine (NICODERM CQ - DOSED IN MG/24 HR) 7 mg/24hr patch       Pt will need sooner f/up if PT/INR is out of therapeutic range - otherwise  Return for 3 month f/up with me for PT/INR routine f/up.   Delsa Grana, PA-C 10/06/22 9:50 AM

## 2022-10-06 NOTE — Assessment & Plan Note (Signed)
Denies any exertional sx, BP and lipids well controlled, encouraged smoking cessation, no cardiology consult in the past several years

## 2022-10-06 NOTE — Assessment & Plan Note (Signed)
Smoking cessation and lung cancer screening discussed for more than 5 min today Smoking cessation instruction/counseling given:  counseled patient on the dangers of tobacco use, advised patient to stop smoking, and reviewed strategies to maximize success Plan to start lower dose nicotine patch and then reduce cig amount per day to 5 for a few weeks then reduce to 3 cig a day - she has goal to get to smoking 1 pack per week Currently not interested in the CT for lung cancer screening

## 2022-10-06 NOTE — Assessment & Plan Note (Signed)
Mild sx, using OTC meds to manage, will not recheck labs - however is sx worsening or progress rechecking uric acid and Rx management would be appropriate

## 2022-10-07 ENCOUNTER — Ambulatory Visit: Payer: Medicare PPO | Admitting: Nurse Practitioner

## 2022-10-07 LAB — CBC WITH DIFFERENTIAL/PLATELET
Absolute Monocytes: 495 cells/uL (ref 200–950)
Basophils Absolute: 22 cells/uL (ref 0–200)
Basophils Relative: 0.4 %
Eosinophils Absolute: 171 cells/uL (ref 15–500)
Eosinophils Relative: 3.1 %
HCT: 39.8 % (ref 35.0–45.0)
Hemoglobin: 13.7 g/dL (ref 11.7–15.5)
Lymphs Abs: 3295 cells/uL (ref 850–3900)
MCH: 29.7 pg (ref 27.0–33.0)
MCHC: 34.4 g/dL (ref 32.0–36.0)
MCV: 86.1 fL (ref 80.0–100.0)
MPV: 9.3 fL (ref 7.5–12.5)
Monocytes Relative: 9 %
Neutro Abs: 1518 cells/uL (ref 1500–7800)
Neutrophils Relative %: 27.6 %
Platelets: 266 10*3/uL (ref 140–400)
RBC: 4.62 10*6/uL (ref 3.80–5.10)
RDW: 13.6 % (ref 11.0–15.0)
Total Lymphocyte: 59.9 %
WBC: 5.5 10*3/uL (ref 3.8–10.8)

## 2022-10-07 LAB — COMPLETE METABOLIC PANEL WITH GFR
AG Ratio: 1.5 (calc) (ref 1.0–2.5)
ALT: 11 U/L (ref 6–29)
AST: 18 U/L (ref 10–35)
Albumin: 4.2 g/dL (ref 3.6–5.1)
Alkaline phosphatase (APISO): 77 U/L (ref 37–153)
BUN: 12 mg/dL (ref 7–25)
CO2: 26 mmol/L (ref 20–32)
Calcium: 9.5 mg/dL (ref 8.6–10.4)
Chloride: 104 mmol/L (ref 98–110)
Creat: 0.82 mg/dL (ref 0.50–1.05)
Globulin: 2.8 g/dL (calc) (ref 1.9–3.7)
Glucose, Bld: 71 mg/dL (ref 65–99)
Potassium: 3.8 mmol/L (ref 3.5–5.3)
Sodium: 141 mmol/L (ref 135–146)
Total Bilirubin: 0.3 mg/dL (ref 0.2–1.2)
Total Protein: 7 g/dL (ref 6.1–8.1)
eGFR: 77 mL/min/{1.73_m2} (ref >=60)

## 2022-10-07 LAB — LIPID PANEL
Cholesterol: 129 mg/dL (ref <200)
HDL: 55 mg/dL (ref >=50)
LDL Cholesterol (Calc): 55 mg/dL (calc)
Non-HDL Cholesterol (Calc): 74 mg/dL (calc) (ref <130)
Total CHOL/HDL Ratio: 2.3 (calc) (ref <5.0)
Triglycerides: 107 mg/dL (ref <150)

## 2022-10-07 LAB — HEMOGLOBIN A1C
Hgb A1c MFr Bld: 6.4 % of total Hgb — ABNORMAL HIGH (ref <5.7)
Mean Plasma Glucose: 137 mg/dL
eAG (mmol/L): 7.6 mmol/L

## 2022-10-07 LAB — PROTIME-INR
INR: 1.9 — ABNORMAL HIGH
Prothrombin Time: 19.3 s — ABNORMAL HIGH (ref 9.0–11.5)

## 2022-10-07 NOTE — Telephone Encounter (Signed)
Pharmacist stated no needed to change, unsure why we received that. Pt received what she exactly got prescribed.

## 2022-11-07 ENCOUNTER — Other Ambulatory Visit: Payer: Self-pay | Admitting: Nurse Practitioner

## 2022-11-07 DIAGNOSIS — I10 Essential (primary) hypertension: Secondary | ICD-10-CM

## 2022-11-09 NOTE — Telephone Encounter (Signed)
Unable to refill per protocol,too soon to refill. Last refill 10/05/22 for 90 days.  Requested Prescriptions  Pending Prescriptions Disp Refills   amLODipine (NORVASC) 5 MG tablet [Pharmacy Med Name: AMLODIPINE BESYLATE 5 MG Tablet] 90 tablet 3    Sig: TAKE 1 TABLET EVERY DAY     Cardiovascular: Calcium Channel Blockers 2 Passed - 11/07/2022  2:36 AM      Passed - Last BP in normal range    BP Readings from Last 1 Encounters:  10/06/22 132/74         Passed - Last Heart Rate in normal range    Pulse Readings from Last 1 Encounters:  10/06/22 100         Passed - Valid encounter within last 6 months    Recent Outpatient Visits           1 month ago Mixed hyperlipidemia   [facility] Delsa Grana, PA-C   4 months ago P[facility] (peripheral artery [facility]) [facility])   [facility] Serafina Royals F, FNP   5 months ago PAD (peripheral artery disease) [facility])   [facility] Bo Merino, FNP   6 months ago Essential hypertension   [place], FNP   7 months ago Anticoagulated on Coumadin   [facility] Bo Merino, FNP       Future Appointments             In 1 month Delsa Grana, [facility], [place]   In 3 months Reece Packer, Myna Hidalgo, [facility], [facility]

## 2022-11-16 ENCOUNTER — Other Ambulatory Visit: Payer: Self-pay | Admitting: Family Medicine

## 2022-11-16 DIAGNOSIS — I739 Peripheral vascular disease, unspecified: Secondary | ICD-10-CM

## 2022-11-16 DIAGNOSIS — Z7901 Long term (current) use of anticoagulants: Secondary | ICD-10-CM

## 2022-11-16 DIAGNOSIS — D689 Coagulation defect, unspecified: Secondary | ICD-10-CM

## 2022-11-16 NOTE — Telephone Encounter (Signed)
Medication Refill - Medication: warfarin (COUMADIN) 5 MG tablet   Has the patient contacted their pharmacy? No. Pt states that she has about 2 weeks left of medication.    Preferred Pharmacy (with phone number or street name):  [place], [place] Phone: [phone]  Fax: [phone]     Has the patient been seen for an appointment in the last year OR does the patient have an upcoming appointment? Yes.    Agent: Please be advised that RX refills may take up to 3 business days. We ask that you follow-up with your pharmacy.

## 2022-11-17 ENCOUNTER — Ambulatory Visit: Payer: Medicare PPO

## 2022-11-17 ENCOUNTER — Other Ambulatory Visit (INDEPENDENT_AMBULATORY_CARE_PROVIDER_SITE_OTHER): Payer: Medicare PPO | Admitting: Physician Assistant

## 2022-11-17 DIAGNOSIS — D689 Coagulation defect, unspecified: Secondary | ICD-10-CM

## 2022-11-17 DIAGNOSIS — I739 Peripheral vascular disease, unspecified: Secondary | ICD-10-CM | POA: Diagnosis not present

## 2022-11-17 DIAGNOSIS — Z7901 Long term (current) use of anticoagulants: Secondary | ICD-10-CM

## 2022-11-17 NOTE — Telephone Encounter (Signed)
PT/INR orders placed

## 2022-11-17 NOTE — Telephone Encounter (Signed)
Pt called, advised her that she is due for updated PT/INR from last lab note on 10/07/22. Pt states she can come in today for repeat labs. Also advised pt that she should have refill left on Coumadin 5mg  at [place] d/t last fill was 2//7/24 #60/1. Pt verbalized understanding. Will route to provider to place order for PT/INR today.   Requested Prescriptions  Pending Prescriptions Disp Refills   warfarin (COUMADIN) 5 MG tablet 60 tablet 1    Sig: TAKE 1 TABLET BY MOUTH ONCE DAILY EVERY  MONDAY,  TUESDAY,  [place],  [place],  [place]     Hematology:  Anticoagulants - warfarin Failed - 11/16/2022 10:41 AM      Failed - Manual Review: If patient's warfarin is managed by Anti-Coag team, route request to them. If not, route request to the provider.      Failed - INR in normal range and within 30 days    POC INR  Date Value Ref Range Status  07/06/2022 30.8  Final   INR  Date Value Ref Range Status  10/06/2022 1.9 (H)  Final    Comment:    Reference Range                     0.9-1.1 Moderate-intensity Warfarin Therapy 2.0-3.0 Higher-intensity Warfarin Therapy   3.0-4.0  .          Passed - HCT in normal range and within 360 days    HCT  Date Value Ref Range Status  10/06/2022 39.8 35.0 - 45.0 % Final   Hematocrit  Date Value Ref Range Status  10/17/2015 39.3 34.0 - 46.6 % Final         Passed - Patient is not pregnant      Passed - Valid encounter within last 3 months    Recent Outpatient Visits           1 month ago Mixed hyperlipidemia   [facility] Delsa Grana, PA-C   4 months ago PAD (peripheral artery disease) [facility])   [facility] Serafina Royals F, FNP   5 months ago PAD (peripheral artery disease) [facility])   [facility] Bo Merino, FNP   6 months ago Essential hypertension   [place], FNP   7 months ago Anticoagulated on Coumadin    [facility] Bo Merino, FNP       Future Appointments             In 1 month Delsa Grana, [facility], [place]   In 3 months Reece Packer, Myna Hidalgo, [facility], [facility]

## 2022-11-18 LAB — PROTIME-INR
INR: 2 — ABNORMAL HIGH
Prothrombin Time: 19.9 s — ABNORMAL HIGH (ref 9.0–11.5)

## 2022-11-18 NOTE — Progress Notes (Signed)
Your INR is in goal range for anticoagulation.Please continue with your current medication regimen as instructed. You should schedule a follow up apt with your provider for regular monitoring.

## 2022-11-19 MED ORDER — WARFARIN SODIUM 5 MG PO TABS: ORAL_TABLET | ORAL | 1 refills | Status: DC

## 2022-11-19 MED ORDER — WARFARIN SODIUM 3 MG PO TABS: ORAL_TABLET | ORAL | 0 refills | Status: DC

## 2022-11-19 NOTE — Progress Notes (Signed)
Refills for medication placed. She will need at monthly PT/INR visits for monitoring.

## 2022-11-19 NOTE — Addendum Note (Signed)
Addended by: Talitha Givens on: 11/19/2022 10:55 AM   Modules accepted: Orders, Level of Service

## 2022-12-31 ENCOUNTER — Other Ambulatory Visit: Payer: Self-pay | Admitting: Nurse Practitioner

## 2022-12-31 DIAGNOSIS — I2510 Atherosclerotic heart disease of native coronary artery without angina pectoris: Secondary | ICD-10-CM

## 2022-12-31 DIAGNOSIS — I739 Peripheral vascular disease, unspecified: Secondary | ICD-10-CM

## 2022-12-31 DIAGNOSIS — E782 Mixed hyperlipidemia: Secondary | ICD-10-CM

## 2022-12-31 NOTE — Telephone Encounter (Signed)
Requested Prescriptions  Pending Prescriptions Disp Refills   atorvastatin (LIPITOR) 10 MG tablet [Pharmacy Med Name: ATORVASTATIN CALCIUM 10 MG Tablet] 90 tablet 0    Sig: TAKE 1 TABLET AT BEDTIME     Cardiovascular:  Antilipid - Statins Failed - 12/31/2022  2:59 AM      Failed - Lipid Panel in normal range within the last 12 months    Cholesterol, Total  Date Value Ref Range Status  10/17/2015 145 100 - 199 mg/dL Final   Cholesterol  Date Value Ref Range Status  10/06/2022 129 <200 mg/dL Final   LDL Cholesterol (Calc)  Date Value Ref Range Status  10/06/2022 55 mg/dL (calc) Final    Comment:    Reference range: <100 . Desirable range <100 mg/dL for primary prevention;   <70 mg/dL for patients with CHD or diabetic patients  with > or = 2 CHD risk factors. Marland Kitchen LDL-C is now calculated using the Martin-Hopkins  calculation, which is a validated novel method providing  better accuracy than the Friedewald equation in the  estimation of LDL-C.  Horald Pollen et al. Lenox Ahr. 9562;130(86): 2061-2068  (http://education.[employer].com/faq/FAQ164)    HDL  Date Value Ref Range Status  10/06/2022 55 > OR = 50 mg/dL Final  57/84/6962 43 >95 mg/dL Final   Triglycerides  Date Value Ref Range Status  10/06/2022 107 <150 mg/dL Final         Passed - Patient is not pregnant      Passed - Valid encounter within last 12 months    Recent Outpatient Visits           2 months ago Mixed hyperlipidemia   [facility] Danelle Berry, PA-C   5 months ago PAD (peripheral artery disease) [facility])   [facility] Della Goo F, FNP   6 months ago PAD (peripheral artery disease) [facility])   [facility] Berniece Salines, FNP   7 months ago Essential hypertension   [facility] Berniece Salines, FNP   8 months ago Anticoagulated on Coumadin   [facility] Berniece Salines, FNP        Future Appointments             In 4 days Danelle Berry, PA-C [facility], [facility]   In 1 month Zane Herald, Rudolpho Sevin, FNP [facility], [facility]

## 2023-01-04 ENCOUNTER — Encounter: Payer: Self-pay | Admitting: Family Medicine

## 2023-01-04 ENCOUNTER — Ambulatory Visit (INDEPENDENT_AMBULATORY_CARE_PROVIDER_SITE_OTHER): Payer: Medicare PPO | Admitting: Family Medicine

## 2023-01-04 VITALS — BP 130/80 | HR 95 | Temp 98.2°F | Resp 16 | Ht 64.0 in | Wt 117.8 lb

## 2023-01-04 DIAGNOSIS — Z5181 Encounter for therapeutic drug level monitoring: Secondary | ICD-10-CM | POA: Diagnosis not present

## 2023-01-04 DIAGNOSIS — Z7901 Long term (current) use of anticoagulants: Secondary | ICD-10-CM

## 2023-01-04 DIAGNOSIS — E782 Mixed hyperlipidemia: Secondary | ICD-10-CM

## 2023-01-04 DIAGNOSIS — I10 Essential (primary) hypertension: Secondary | ICD-10-CM | POA: Diagnosis not present

## 2023-01-04 DIAGNOSIS — I739 Peripheral vascular disease, unspecified: Secondary | ICD-10-CM | POA: Diagnosis not present

## 2023-01-04 DIAGNOSIS — L989 Disorder of the skin and subcutaneous tissue, unspecified: Secondary | ICD-10-CM

## 2023-01-04 DIAGNOSIS — Z8739 Personal history of other diseases of the musculoskeletal system and connective tissue: Secondary | ICD-10-CM

## 2023-01-04 DIAGNOSIS — E162 Hypoglycemia, unspecified: Secondary | ICD-10-CM

## 2023-01-04 DIAGNOSIS — D689 Coagulation defect, unspecified: Secondary | ICD-10-CM | POA: Diagnosis not present

## 2023-01-04 LAB — CBC WITH DIFFERENTIAL/PLATELET
Absolute Monocytes: 496 cells/uL (ref 200–950)
Basophils Absolute: 37 cells/uL (ref 0–200)
HCT: 40.3 % (ref 35.0–45.0)
Hemoglobin: 13.9 g/dL (ref 11.7–15.5)
Lymphs Abs: 3714 cells/uL (ref 850–3900)
MCH: 30 pg (ref 27.0–33.0)
MCHC: 34.5 g/dL (ref 32.0–36.0)
Monocytes Relative: 8 %
Neutro Abs: 1835 cells/uL (ref 1500–7800)

## 2023-01-04 NOTE — Progress Notes (Unsigned)
Name: Mary [name]   MRN: [id number]    DOB: 02/16/1951   Date:01/04/2023       Progress Note  No chief complaint on file.    Subjective:   DANIAL FLIPPIN is a 72 y.o. female, presents to clinic for routine f/up and PT/INR check  [place]  on warfarin Lab Results  Component Value Date   INR 2.0 (H) 11/17/2022   INR 1.9 (H) 10/06/2022   INR 2.5 07/06/2022         prediabetes Recent pertinent labs: Lab Results  Component Value Date   HGBA1C 6.4 (H) 10/06/2022   HGBA1C 6.2 (H) 10/13/2017      Prior OV with complaint of hypoglycemia Prediabetic not on meds Lab Results  Component Value Date   HGBA1C 6.4 (H) 10/06/2022   Hyperlipidemia: Currently treated with ***, pt reports *** med compliance Last Lipids: Lab Results  Component Value Date   CHOL 129 10/06/2022   HDL 55 10/06/2022   LDLCALC 55 10/06/2022   TRIG 107 10/06/2022   CHOLHDL 2.3 10/06/2022   - {ACTIONS;DENIES/REPORTS:[id number]::"Denies"}: Chest pain, shortness of breath, myalgias, claudication   Hypertension:  Currently managed on *** Pt reports *** med compliance and denies any SE.   Blood pressure today is *** controlled. BP Readings from Last 3 Encounters:  10/06/22 132/74  07/06/22 118/82  06/08/22 118/72   Pt denies CP, SOB, exertional sx, LE edema, palpitation, Ha's, visual disturbances, lightheadedness, hypotension, syncope. Dietary efforts for BP?  ***     Current Outpatient Medications:    acetaminophen (TYLENOL) 500 MG tablet, Take 500 mg by mouth 2 (two) times daily as needed for moderate pain or headache., Disp: , Rfl:    amLODipine (NORVASC) 5 MG tablet, Take 1 tablet (5 mg total) by mouth daily., Disp: 90 tablet, Rfl: 1   atorvastatin (LIPITOR) 10 MG tablet, TAKE 1 TABLET AT BEDTIME, Disp: 90 tablet, Rfl: 0   cholecalciferol (VITAMIN D3) 25 MCG (1000 UNIT) tablet, Take 2 tablets (2,000 Units total) by mouth daily., Disp: , Rfl:    nicotine (NICODERM CQ - DOSED IN MG/24 HR)  7 mg/24hr patch, Place 1 patch (7 mg total) onto the skin daily., Disp: 28 patch, Rfl: 2   vitamin E 400 UNIT capsule, Take 180 Units by mouth daily. , Disp: , Rfl:    warfarin (COUMADIN) 3 MG tablet, Take 1 tablet by mouth once daily on Wed and Fri, Disp: 30 tablet, Rfl: 0   warfarin (COUMADIN) 5 MG tablet, TAKE 1 TABLET BY MOUTH ONCE DAILY EVERY  MONDAY,  TUESDAY,  THURSDAY,  SATURDAY,  AND  SUNDAY, Disp: 60 tablet, Rfl: 1  Patient Active Problem List   Diagnosis Date Noted   Coagulation disorder (HCC) 01/01/2022   Anticoagulated on Coumadin 02/05/2020   History of gout 06/23/2019   Coronary artery disease 02/16/2019   Benign neoplasm of cecum    Benign neoplasm of transverse colon    Polyp of sigmoid colon    External hemorrhoids    Hip arthritis 10/27/2017   Breast lump on left side at 8 o'clock position 07/08/2017   Breast lump on right side at 7 o'clock position 11/14/2015   Encounter for monitoring Coumadin therapy 02/27/2015   PAD (peripheral artery disease) (HCC)    Essential hypertension    Mixed hyperlipidemia    Tobacco abuse    Hyperuricemia     Past Surgical History:  Procedure Laterality Date   ANGIOPLASTY  July 2011   PAD-  popliteal artery   COLONOSCOPY WITH PROPOFOL N/A 01/13/2018   Procedure: COLONOSCOPY WITH PROPOFOL;  Surgeon: Toney Reil, MD;  Location: [facility] ENDOSCOPY;  Service: Gastroenterology;  Laterality: N/A;   COLONOSCOPY WITH PROPOFOL N/A 01/14/2018   Procedure: COLONOSCOPY WITH PROPOFOL;  Surgeon: Pasty Spillers, MD;  Location: [facility] ENDOSCOPY;  Service: Endoscopy;  Laterality: N/A;   COLONOSCOPY WITH PROPOFOL N/A 07/06/2018   Procedure: COLONOSCOPY WITH PROPOFOL;  Surgeon: Pasty Spillers, MD;  Location: [facility] ENDOSCOPY;  Service: Endoscopy;  Laterality: N/A;   CORONARY ANGIOPLASTY     JOINT REPLACEMENT     Leg Bypass Surgery Right 08/2010   OVARIAN CYST SURGERY  1980s   POPLITEAL ARTERY STENT Right 05/2010   POPLITEAL ARTERY STENT  Right 07/2010   TOTAL HIP ARTHROPLASTY Left 10/27/2017   Procedure: TOTAL HIP ARTHROPLASTY;  Surgeon: Deeann Saint, MD;  Location: [facility] ORS;  Service: Orthopedics;  Laterality: Left;    Family History  Problem Relation Age of Onset   Arthritis Mother    Diabetes Mother    Stroke Mother    Hypertension Mother    Dementia Mother    Cancer Father    Arthritis Sister    Seizures Sister    Diabetes Sister    Hypertension Sister    COPD Sister    Cancer Brother    Stroke Maternal Grandmother    Diabetes Paternal Grandmother    Cancer Sister    Diabetes Sister    Arthritis Sister    Diabetes Brother    Stroke Brother    Healthy Brother    Diabetes Brother    Stroke Brother    Cancer Brother    Heart disease Neg Hx    Breast cancer Neg Hx     Social History   Tobacco Use   Smoking status: Every Day    Packs/day: 0.50    Years: 40.00    Additional pack years: 0.00    Total pack years: 20.00    Types: Cigarettes   Smokeless tobacco: Never  Vaping Use   Vaping Use: Never used  Substance Use Topics   Alcohol use: No   Drug use: No     No Known Allergies  Health Maintenance  Topic Date Due   COVID-19 Vaccine (4 - 2023-24 season) 05/01/2022   Medicare Annual Wellness (AWV)  02/25/2023   Zoster Vaccines- Shingrix (1 of 2) 01/04/2023 (Originally 04/08/2003)   DEXA SCAN  02/25/2023 (Originally 06-03-1951)   Lung Cancer Screening  10/07/2023 (Originally 04/08/2003)   INFLUENZA VACCINE  04/01/2023   COLONOSCOPY (Pts 45-48yrs Insurance coverage will need to be confirmed)  07/07/2023   MAMMOGRAM  09/02/2023   DTaP/Tdap/Td (2 - Td or Tdap) 09/28/2027   Pneumonia Vaccine 49+ Years old  Completed   Hepatitis C Screening  Completed   HPV VACCINES  Aged Out    Chart Review Today: ***  Review of Systems   Objective:   There were no vitals filed for this visit.  There is no height or weight on file to calculate BMI.  Physical Exam      Assessment & Plan:    Problem List Items Addressed This Visit       Other   Anticoagulated on Coumadin - Primary   Other Visit Diagnoses     Hypoglycemia       Encounter for medication monitoring               No follow-ups on file.   Danelle Berry,  PA-C 01/04/23 10:48 AM

## 2023-01-05 LAB — COMPLETE METABOLIC PANEL WITH GFR
AG Ratio: 1.5 (calc) (ref 1.0–2.5)
ALT: 10 U/L (ref 6–29)
AST: 20 U/L (ref 10–35)
Albumin: 4.3 g/dL (ref 3.6–5.1)
Alkaline phosphatase (APISO): 76 U/L (ref 37–153)
BUN: 10 mg/dL (ref 7–25)
CO2: 27 mmol/L (ref 20–32)
Calcium: 9.3 mg/dL (ref 8.6–10.4)
Chloride: 104 mmol/L (ref 98–110)
Creat: 0.93 mg/dL (ref 0.50–1.05)
Globulin: 2.8 g/dL (calc) (ref 1.9–3.7)
Glucose, Bld: 80 mg/dL (ref 65–99)
Potassium: 4.7 mmol/L (ref 3.5–5.3)
Sodium: 140 mmol/L (ref 135–146)
Total Bilirubin: 0.4 mg/dL (ref 0.2–1.2)
Total Protein: 7.1 g/dL (ref 6.1–8.1)
eGFR: 67 mL/min/{1.73_m2} (ref >=60)

## 2023-01-05 LAB — CBC WITH DIFFERENTIAL/PLATELET
Basophils Relative: 0.6 %
Eosinophils Absolute: 118 cells/uL (ref 15–500)
Eosinophils Relative: 1.9 %
MCV: 86.9 fL (ref 80.0–100.0)
MPV: 9.5 fL (ref 7.5–12.5)
Neutrophils Relative %: 29.6 %
Platelets: 271 10*3/uL (ref 140–400)
RBC: 4.64 10*6/uL (ref 3.80–5.10)
RDW: 13.4 % (ref 11.0–15.0)
Total Lymphocyte: 59.9 %
WBC: 6.2 10*3/uL (ref 3.8–10.8)

## 2023-01-05 LAB — HEMOGLOBIN A1C
Hgb A1c MFr Bld: 6.3 % of total Hgb — ABNORMAL HIGH (ref <5.7)
Mean Plasma Glucose: 134 mg/dL
eAG (mmol/L): 7.4 mmol/L

## 2023-01-05 LAB — PROTIME-INR
INR: 1.9 — ABNORMAL HIGH
Prothrombin Time: 19.7 s — ABNORMAL HIGH (ref 9.0–11.5)

## 2023-01-06 ENCOUNTER — Encounter: Payer: Self-pay | Admitting: Family Medicine

## 2023-01-06 MED ORDER — WARFARIN SODIUM 5 MG PO TABS
ORAL_TABLET | ORAL | 0 refills | Status: DC
Start: 2023-01-06 — End: 2023-02-02

## 2023-01-06 MED ORDER — WARFARIN SODIUM 3 MG PO TABS
ORAL_TABLET | ORAL | 0 refills | Status: DC
Start: 2023-01-06 — End: 2023-02-02

## 2023-01-06 NOTE — Assessment & Plan Note (Signed)
On statins, compliant and tolerating, lipids just done and LDL at goal  Lab Results  Component Value Date   CHOL 129 10/06/2022   HDL 55 10/06/2022   LDLCALC 55 10/06/2022   TRIG 107 10/06/2022   CHOLHDL 2.3 10/06/2022

## 2023-01-06 NOTE — Assessment & Plan Note (Signed)
Bp at goal today on norvasc 5mg  BP Readings from Last 3 Encounters:  01/04/23 130/80  10/06/22 132/74  07/06/22 118/82  Continue meds, DASH and smoking cessation efforts

## 2023-01-06 NOTE — Assessment & Plan Note (Signed)
Per vascular specialists - no concerning sx

## 2023-01-06 NOTE — Assessment & Plan Note (Signed)
She has been subtherapeutic several times this year, monitoring it less frequently Discussed with pt having to resume PT/INR checks with longest interval about 6 weeks once labs at goal and dosing stable

## 2023-01-06 NOTE — Assessment & Plan Note (Signed)
Not currently on meds, no recent flares/episodes

## 2023-01-07 ENCOUNTER — Other Ambulatory Visit: Payer: Self-pay | Admitting: Nurse Practitioner

## 2023-01-07 DIAGNOSIS — I10 Essential (primary) hypertension: Secondary | ICD-10-CM

## 2023-01-07 NOTE — Telephone Encounter (Signed)
Requested Prescriptions  Pending Prescriptions Disp Refills   amLODipine (NORVASC) 5 MG tablet [Pharmacy Med Name: AMLODIPINE BESYLATE 5 MG Tablet] 90 tablet 1    Sig: TAKE 1 TABLET EVERY DAY     Cardiovascular: Calcium Channel Blockers 2 Passed - 01/07/2023  2:40 PM      Passed - Last BP in normal range    BP Readings from Last 1 Encounters:  01/04/23 130/80         Passed - Last Heart Rate in normal range    Pulse Readings from Last 1 Encounters:  01/04/23 95         Passed - Valid encounter within last 6 months    Recent Outpatient Visits           3 days ago Anticoagulated on Coumadin   [facility] Danelle Berry, PA-C   3 months ago Mixed hyperlipidemia   [facility] Danelle Berry, PA-C   6 months ago PAD (peripheral artery disease) [facility])   [facility] Della Goo F, FNP   7 months ago PAD (peripheral artery disease) [facility])   [facility] Berniece Salines, FNP   8 months ago Essential hypertension   [facility] Berniece Salines, FNP       Future Appointments             In 1 month Danelle Berry, PA-C [facility]   In 1 month Zane Herald, Rudolpho Sevin, FNP [facility], [facility]

## 2023-01-15 DIAGNOSIS — Z5181 Encounter for therapeutic drug level monitoring: Secondary | ICD-10-CM | POA: Diagnosis not present

## 2023-01-15 DIAGNOSIS — Z7901 Long term (current) use of anticoagulants: Secondary | ICD-10-CM | POA: Diagnosis not present

## 2023-01-15 DIAGNOSIS — D689 Coagulation defect, unspecified: Secondary | ICD-10-CM | POA: Diagnosis not present

## 2023-01-15 LAB — PROTIME-INR
INR: 2.2 — ABNORMAL HIGH
Prothrombin Time: 21.8 s — ABNORMAL HIGH (ref 9.0–11.5)

## 2023-01-27 ENCOUNTER — Emergency Department (HOSPITAL_COMMUNITY): Payer: Medicare PPO

## 2023-01-27 ENCOUNTER — Emergency Department (HOSPITAL_COMMUNITY): Payer: Medicare PPO | Admitting: Critical Care Medicine

## 2023-01-27 ENCOUNTER — Other Ambulatory Visit: Payer: Self-pay

## 2023-01-27 ENCOUNTER — Inpatient Hospital Stay (HOSPITAL_COMMUNITY): Payer: Medicare PPO

## 2023-01-27 ENCOUNTER — Encounter (HOSPITAL_COMMUNITY): Payer: Self-pay

## 2023-01-27 ENCOUNTER — Inpatient Hospital Stay (HOSPITAL_COMMUNITY)
Admission: EM | Admit: 2023-01-27 | Discharge: 2023-02-02 | DRG: 023 | Disposition: A | Discharge status: Rehabilitation Facility | Payer: Medicare PPO | Attending: Neurology | Admitting: Neurology

## 2023-01-27 ENCOUNTER — Encounter (HOSPITAL_COMMUNITY)
Admission: EM | Disposition: A | Discharge status: Rehabilitation Facility | Payer: Self-pay | Source: Home / Self Care | Attending: Neurology

## 2023-01-27 DIAGNOSIS — I10 Essential (primary) hypertension: Secondary | ICD-10-CM

## 2023-01-27 DIAGNOSIS — I63031 Cerebral infarction due to thrombosis of right carotid artery: Principal | ICD-10-CM | POA: Diagnosis present

## 2023-01-27 DIAGNOSIS — F17210 Nicotine dependence, cigarettes, uncomplicated: Secondary | ICD-10-CM | POA: Diagnosis present

## 2023-01-27 DIAGNOSIS — I63131 Cerebral infarction due to embolism of right carotid artery: Secondary | ICD-10-CM | POA: Diagnosis not present

## 2023-01-27 DIAGNOSIS — I513 Intracardiac thrombosis, not elsewhere classified: Secondary | ICD-10-CM | POA: Diagnosis present

## 2023-01-27 DIAGNOSIS — R29818 Other symptoms and signs involving the nervous system: Secondary | ICD-10-CM | POA: Diagnosis not present

## 2023-01-27 DIAGNOSIS — I739 Peripheral vascular disease, unspecified: Secondary | ICD-10-CM | POA: Diagnosis present

## 2023-01-27 DIAGNOSIS — Z1152 Encounter for screening for COVID-19: Secondary | ICD-10-CM

## 2023-01-27 DIAGNOSIS — I6521 Occlusion and stenosis of right carotid artery: Secondary | ICD-10-CM | POA: Diagnosis not present

## 2023-01-27 DIAGNOSIS — Z7901 Long term (current) use of anticoagulants: Secondary | ICD-10-CM

## 2023-01-27 DIAGNOSIS — K5900 Constipation, unspecified: Secondary | ICD-10-CM | POA: Diagnosis present

## 2023-01-27 DIAGNOSIS — I63421 Cerebral infarction due to embolism of right anterior cerebral artery: Secondary | ICD-10-CM | POA: Diagnosis not present

## 2023-01-27 DIAGNOSIS — I639 Cerebral infarction, unspecified: Secondary | ICD-10-CM | POA: Diagnosis not present

## 2023-01-27 DIAGNOSIS — I63231 Cerebral infarction due to unspecified occlusion or stenosis of right carotid arteries: Secondary | ICD-10-CM | POA: Diagnosis not present

## 2023-01-27 DIAGNOSIS — M810 Age-related osteoporosis without current pathological fracture: Secondary | ICD-10-CM | POA: Diagnosis present

## 2023-01-27 DIAGNOSIS — H518 Other specified disorders of binocular movement: Secondary | ICD-10-CM | POA: Diagnosis present

## 2023-01-27 DIAGNOSIS — I6300 Cerebral infarction due to thrombosis of unspecified precerebral artery: Secondary | ICD-10-CM | POA: Diagnosis not present

## 2023-01-27 DIAGNOSIS — Z8261 Family history of arthritis: Secondary | ICD-10-CM | POA: Diagnosis not present

## 2023-01-27 DIAGNOSIS — Z79899 Other long term (current) drug therapy: Secondary | ICD-10-CM

## 2023-01-27 DIAGNOSIS — Z833 Family history of diabetes mellitus: Secondary | ICD-10-CM | POA: Diagnosis not present

## 2023-01-27 DIAGNOSIS — E785 Hyperlipidemia, unspecified: Secondary | ICD-10-CM | POA: Diagnosis present

## 2023-01-27 DIAGNOSIS — I2510 Atherosclerotic heart disease of native coronary artery without angina pectoris: Secondary | ICD-10-CM | POA: Diagnosis not present

## 2023-01-27 DIAGNOSIS — D689 Coagulation defect, unspecified: Secondary | ICD-10-CM | POA: Diagnosis present

## 2023-01-27 DIAGNOSIS — R2990 Unspecified symptoms and signs involving the nervous system: Secondary | ICD-10-CM

## 2023-01-27 DIAGNOSIS — Z9861 Coronary angioplasty status: Secondary | ICD-10-CM

## 2023-01-27 DIAGNOSIS — I6389 Other cerebral infarction: Secondary | ICD-10-CM | POA: Diagnosis not present

## 2023-01-27 DIAGNOSIS — I63411 Cerebral infarction due to embolism of right middle cerebral artery: Secondary | ICD-10-CM | POA: Diagnosis not present

## 2023-01-27 DIAGNOSIS — I69352 Hemiplegia and hemiparesis following cerebral infarction affecting left dominant side: Secondary | ICD-10-CM | POA: Diagnosis not present

## 2023-01-27 DIAGNOSIS — R7303 Prediabetes: Secondary | ICD-10-CM | POA: Diagnosis present

## 2023-01-27 DIAGNOSIS — Z825 Family history of asthma and other chronic lower respiratory diseases: Secondary | ICD-10-CM

## 2023-01-27 DIAGNOSIS — H53469 Homonymous bilateral field defects, unspecified side: Secondary | ICD-10-CM | POA: Diagnosis present

## 2023-01-27 DIAGNOSIS — Z716 Tobacco abuse counseling: Secondary | ICD-10-CM | POA: Diagnosis not present

## 2023-01-27 DIAGNOSIS — R4781 Slurred speech: Secondary | ICD-10-CM | POA: Diagnosis not present

## 2023-01-27 DIAGNOSIS — Z96642 Presence of left artificial hip joint: Secondary | ICD-10-CM | POA: Diagnosis present

## 2023-01-27 DIAGNOSIS — R29810 Facial weakness: Secondary | ICD-10-CM | POA: Diagnosis present

## 2023-01-27 DIAGNOSIS — R0689 Other abnormalities of breathing: Secondary | ICD-10-CM | POA: Diagnosis not present

## 2023-01-27 DIAGNOSIS — G8194 Hemiplegia, unspecified affecting left nondominant side: Secondary | ICD-10-CM | POA: Diagnosis not present

## 2023-01-27 DIAGNOSIS — R4189 Other symptoms and signs involving cognitive functions and awareness: Secondary | ICD-10-CM | POA: Diagnosis present

## 2023-01-27 DIAGNOSIS — R110 Nausea: Secondary | ICD-10-CM | POA: Diagnosis not present

## 2023-01-27 DIAGNOSIS — M7989 Other specified soft tissue disorders: Secondary | ICD-10-CM | POA: Diagnosis not present

## 2023-01-27 DIAGNOSIS — Z823 Family history of stroke: Secondary | ICD-10-CM

## 2023-01-27 DIAGNOSIS — R414 Neurologic neglect syndrome: Secondary | ICD-10-CM | POA: Diagnosis present

## 2023-01-27 DIAGNOSIS — I63511 Cerebral infarction due to unspecified occlusion or stenosis of right middle cerebral artery: Secondary | ICD-10-CM

## 2023-01-27 DIAGNOSIS — E871 Hypo-osmolality and hyponatremia: Secondary | ICD-10-CM | POA: Diagnosis present

## 2023-01-27 DIAGNOSIS — R29714 NIHSS score 14: Secondary | ICD-10-CM | POA: Diagnosis present

## 2023-01-27 DIAGNOSIS — I6523 Occlusion and stenosis of bilateral carotid arteries: Secondary | ICD-10-CM | POA: Diagnosis not present

## 2023-01-27 DIAGNOSIS — I70201 Unspecified atherosclerosis of native arteries of extremities, right leg: Secondary | ICD-10-CM | POA: Diagnosis not present

## 2023-01-27 DIAGNOSIS — I69392 Facial weakness following cerebral infarction: Secondary | ICD-10-CM | POA: Diagnosis not present

## 2023-01-27 DIAGNOSIS — Z8249 Family history of ischemic heart disease and other diseases of the circulatory system: Secondary | ICD-10-CM

## 2023-01-27 DIAGNOSIS — F17200 Nicotine dependence, unspecified, uncomplicated: Secondary | ICD-10-CM | POA: Diagnosis not present

## 2023-01-27 HISTORY — PX: RADIOLOGY WITH ANESTHESIA: SHX6223

## 2023-01-27 HISTORY — PX: IR PERCUTANEOUS ART THROMBECTOMY/INFUSION INTRACRANIAL INC DIAG ANGIO: IMG6087

## 2023-01-27 HISTORY — PX: IR CT HEAD LTD: IMG2386

## 2023-01-27 LAB — COMPREHENSIVE METABOLIC PANEL
ALT: 16 U/L (ref 0–44)
AST: 24 U/L (ref 15–41)
Albumin: 3.8 g/dL (ref 3.5–5.0)
Alkaline Phosphatase: 70 U/L (ref 38–126)
Anion gap: 10 (ref 5–15)
BUN: 9 mg/dL (ref 8–23)
CO2: 25 mmol/L (ref 22–32)
Calcium: 9 mg/dL (ref 8.9–10.3)
Chloride: 101 mmol/L (ref 98–111)
Creatinine, Ser: 0.89 mg/dL (ref 0.44–1.00)
GFR, Estimated: >60 mL/min (ref >60)
Glucose, Bld: 127 mg/dL — ABNORMAL HIGH (ref 70–99)
Potassium: 3 mmol/L — ABNORMAL LOW (ref 3.5–5.1)
Sodium: 136 mmol/L (ref 135–145)
Total Bilirubin: 0.4 mg/dL (ref 0.3–1.2)
Total Protein: 7.4 g/dL (ref 6.5–8.1)

## 2023-01-27 LAB — PROTIME-INR
INR: 2.4 — ABNORMAL HIGH (ref 0.8–1.2)
Prothrombin Time: 26.5 seconds — ABNORMAL HIGH (ref 11.4–15.2)

## 2023-01-27 LAB — GLUCOSE, CAPILLARY
Glucose-Capillary: 122 mg/dL — ABNORMAL HIGH (ref 70–99)
Glucose-Capillary: 126 mg/dL — ABNORMAL HIGH (ref 70–99)
Glucose-Capillary: 146 mg/dL — ABNORMAL HIGH (ref 70–99)

## 2023-01-27 LAB — CBC
HCT: 39.3 % (ref 36.0–46.0)
Hemoglobin: 13.1 g/dL (ref 12.0–15.0)
MCH: 29.3 pg (ref 26.0–34.0)
MCHC: 33.3 g/dL (ref 30.0–36.0)
MCV: 87.9 fL (ref 80.0–100.0)
Platelets: 243 10*3/uL (ref 150–400)
RBC: 4.47 MIL/uL (ref 3.87–5.11)
RDW: 13.3 % (ref 11.5–15.5)
WBC: 9.5 10*3/uL (ref 4.0–10.5)
nRBC: 0 % (ref 0.0–0.2)

## 2023-01-27 LAB — DIFFERENTIAL
Abs Immature Granulocytes: 0.01 10*3/uL (ref 0.00–0.07)
Basophils Absolute: 0.1 10*3/uL (ref 0.0–0.1)
Basophils Relative: 1 %
Eosinophils Absolute: 0.2 10*3/uL (ref 0.0–0.5)
Eosinophils Relative: 2 %
Immature Granulocytes: 0 %
Lymphocytes Relative: 70 %
Lymphs Abs: 6.7 10*3/uL — ABNORMAL HIGH (ref 0.7–4.0)
Monocytes Absolute: 0.7 10*3/uL (ref 0.1–1.0)
Monocytes Relative: 8 %
Neutro Abs: 1.8 10*3/uL (ref 1.7–7.7)
Neutrophils Relative %: 19 %

## 2023-01-27 LAB — MRSA NEXT GEN BY PCR, NASAL: MRSA by PCR Next Gen: NOT DETECTED

## 2023-01-27 LAB — CBG MONITORING, ED: Glucose-Capillary: 128 mg/dL — ABNORMAL HIGH (ref 70–99)

## 2023-01-27 LAB — I-STAT CHEM 8, ED
BUN: 10 mg/dL (ref 8–23)
Calcium, Ion: 1.13 mmol/L — ABNORMAL LOW (ref 1.15–1.40)
Chloride: 99 mmol/L (ref 98–111)
Creatinine, Ser: 0.8 mg/dL (ref 0.44–1.00)
Glucose, Bld: 121 mg/dL — ABNORMAL HIGH (ref 70–99)
HCT: 41 % (ref 36.0–46.0)
Hemoglobin: 13.9 g/dL (ref 12.0–15.0)
Potassium: 2.9 mmol/L — ABNORMAL LOW (ref 3.5–5.1)
Sodium: 139 mmol/L (ref 135–145)
TCO2: 28 mmol/L (ref 22–32)

## 2023-01-27 LAB — ETHANOL: Alcohol, Ethyl (B): <10 mg/dL (ref <10)

## 2023-01-27 LAB — SARS CORONAVIRUS 2 BY RT PCR: SARS Coronavirus 2 by RT PCR: NEGATIVE

## 2023-01-27 LAB — APTT: aPTT: 30 seconds (ref 24–36)

## 2023-01-27 SURGERY — IR WITH ANESTHESIA
Anesthesia: General

## 2023-01-27 MED ORDER — CEFAZOLIN SODIUM-DEXTROSE 2-3 GM-%(50ML) IV SOLR
INTRAVENOUS | Status: DC | PRN
  Administered 2023-01-27: 2 g via INTRAVENOUS

## 2023-01-27 MED ORDER — NITROGLYCERIN 1 MG/10 ML FOR IR/CATH LAB
INTRA_ARTERIAL | Status: AC
  Filled 2023-01-27: qty 10

## 2023-01-27 MED ORDER — ASPIRIN 81 MG PO CHEW: 81.0000 mg | CHEWABLE_TABLET | Freq: Every day | ORAL | Status: DC

## 2023-01-27 MED ORDER — ACETAMINOPHEN 160 MG/5ML PO SOLN: 650.0000 mg | ORAL | Status: DC | PRN

## 2023-01-27 MED ORDER — SENNOSIDES-DOCUSATE SODIUM 8.6-50 MG PO TABS
1.0000 | ORAL_TABLET | Freq: Every evening | ORAL | Status: DC | PRN
  Administered 2023-01-31: 1 via ORAL
  Filled 2023-01-27 (×2): qty 1

## 2023-01-27 MED ORDER — STROKE: EARLY STAGES OF RECOVERY BOOK
Freq: Once | Status: AC
  Filled 2023-01-27 (×2): qty 1

## 2023-01-27 MED ORDER — ONDANSETRON HCL 4 MG/2ML IJ SOLN
INTRAMUSCULAR | Status: DC | PRN
  Administered 2023-01-27: 4 mg via INTRAVENOUS

## 2023-01-27 MED ORDER — PROPOFOL 10 MG/ML IV BOLUS
INTRAVENOUS | Status: DC | PRN
  Administered 2023-01-27 (×7): 10 mg via INTRAVENOUS
  Administered 2023-01-27: 100 mg via INTRAVENOUS

## 2023-01-27 MED ORDER — LACTATED RINGERS IV SOLN: INTRAVENOUS | Status: DC | PRN

## 2023-01-27 MED ORDER — ROCURONIUM BROMIDE 10 MG/ML (PF) SYRINGE
PREFILLED_SYRINGE | INTRAVENOUS | Status: DC | PRN
  Administered 2023-01-27: 40 mg via INTRAVENOUS

## 2023-01-27 MED ORDER — ASPIRIN 300 MG RE SUPP
300.0000 mg | Freq: Once | RECTAL | Status: AC
  Administered 2023-01-27: 300 mg via RECTAL
  Filled 2023-01-27: qty 1

## 2023-01-27 MED ORDER — IOHEXOL 300 MG/ML  SOLN
150.0000 mL | Freq: Once | INTRAMUSCULAR | Status: AC | PRN
  Administered 2023-01-27: 84 mL via INTRA_ARTERIAL

## 2023-01-27 MED ORDER — ACETAMINOPHEN 325 MG PO TABS: 650.0000 mg | ORAL_TABLET | ORAL | Status: DC | PRN

## 2023-01-27 MED ORDER — DEXAMETHASONE SODIUM PHOSPHATE 10 MG/ML IJ SOLN
INTRAMUSCULAR | Status: DC | PRN
  Administered 2023-01-27: 4 mg via INTRAVENOUS

## 2023-01-27 MED ORDER — LIDOCAINE 2% (20 MG/ML) 5 ML SYRINGE
INTRAMUSCULAR | Status: DC | PRN
  Administered 2023-01-27: 20 mg via INTRAVENOUS

## 2023-01-27 MED ORDER — IOHEXOL 350 MG/ML SOLN
75.0000 mL | Freq: Once | INTRAVENOUS | Status: AC | PRN
  Administered 2023-01-27: 75 mL via INTRAVENOUS

## 2023-01-27 MED ORDER — CLEVIDIPINE BUTYRATE 0.5 MG/ML IV EMUL
INTRAVENOUS | Status: DC | PRN
  Administered 2023-01-27: 2 mg/h via INTRAVENOUS

## 2023-01-27 MED ORDER — CHLORHEXIDINE GLUCONATE CLOTH 2 % EX PADS
6.0000 | MEDICATED_PAD | Freq: Every day | CUTANEOUS | Status: DC
  Administered 2023-01-28: 6 via TOPICAL

## 2023-01-27 MED ORDER — CLEVIDIPINE BUTYRATE 0.5 MG/ML IV EMUL
0.0000 mg/h | INTRAVENOUS | Status: DC
  Administered 2023-01-27: 1 mg/h via INTRAVENOUS
  Administered 2023-01-28: 2 mg/h via INTRAVENOUS
  Filled 2023-01-27 (×3): qty 50

## 2023-01-27 MED ORDER — SODIUM CHLORIDE 0.9 % IV SOLN: INTRAVENOUS | Status: DC

## 2023-01-27 MED ORDER — PHENYLEPHRINE HCL-NACL 20-0.9 MG/250ML-% IV SOLN
INTRAVENOUS | Status: DC | PRN
  Administered 2023-01-27: 15 ug/min via INTRAVENOUS

## 2023-01-27 MED ORDER — ACETAMINOPHEN 650 MG RE SUPP: 650.0000 mg | RECTAL | Status: DC | PRN

## 2023-01-27 MED ORDER — FENTANYL CITRATE (PF) 250 MCG/5ML IJ SOLN
INTRAMUSCULAR | Status: DC | PRN
  Administered 2023-01-27: 100 ug via INTRAVENOUS

## 2023-01-27 MED ORDER — SODIUM CHLORIDE 0.9% FLUSH: 3.0000 mL | Freq: Once | INTRAVENOUS | Status: DC

## 2023-01-27 MED ORDER — CEFAZOLIN SODIUM-DEXTROSE 2-4 GM/100ML-% IV SOLN
INTRAVENOUS | Status: AC
  Filled 2023-01-27: qty 100

## 2023-01-27 MED ORDER — SUCCINYLCHOLINE CHLORIDE 200 MG/10ML IV SOSY
PREFILLED_SYRINGE | INTRAVENOUS | Status: DC | PRN
  Administered 2023-01-27: 100 mg via INTRAVENOUS

## 2023-01-27 MED ORDER — ACETAMINOPHEN 325 MG PO TABS
650.0000 mg | ORAL_TABLET | ORAL | Status: DC | PRN
  Administered 2023-01-28 – 2023-01-30 (×2): 650 mg via ORAL
  Filled 2023-01-27 (×4): qty 2

## 2023-01-27 MED ORDER — SUGAMMADEX SODIUM 200 MG/2ML IV SOLN
INTRAVENOUS | Status: DC | PRN
  Administered 2023-01-27: 200 mg via INTRAVENOUS

## 2023-01-27 NOTE — Code Documentation (Signed)
Stroke Response Nurse Documentation Code Documentation  SUPREET TIM is a 72 y.o. female arriving to [facility]  via [facility] on 01/27/2023 with past medical hx significant of peripheral artery disease, hypertension, hyperlipidemia, and tobacco abuse. On warfarin daily.   Patient from home with husband where she was LKW at 0945 when she began to complain of feeling hot and was noted to have left sided weakness. Code stroke was activated by EMS.   Stroke team at the bedside on patient arrival. Labs drawn and patient cleared for CT by EDP. Patient to CT with team. NIHSS 14, see documentation for details and code stroke times. Patient with right gaze preference , left hemianopia, left facial droop, left arm weakness, left decreased sensation, and Visual  neglect on exam. The following imaging was completed:  CT Head and CTA. Delay in lab results lead to delay in decision regarding tnk. Patient is not a candidate for IV Thrombolytic due to PT/INR 26.5/2.4. Patient is a candidate for IR due to CTA "Mural adherent clot in the right internal carotid artery after the bifurcation resulting in high-grade stenosis, estimated at 80%."  Care Plan: Admit to ICU post IR procedure for close monitoring per order set.   Bedside handoff with IR team.  Ferman Hamming Stroke Response RN

## 2023-01-27 NOTE — H&P (Signed)
Stroke Neurology HPI Note  Consult Requested by: Dr. Durwin Nora  Reason for Consult: stroke  Consult Date: 01/27/23   The history was obtained from the pt and EMS.  During history and examination, all items were able to obtain unless otherwise noted.  History of Present Illness:  Mary [name] is a 72 y.o. African American female with PMH of PAD, CAD, smoker, HTN, HLD, coagulation disorder on coumadin presented to ED for code stroke. Per EMS, pt was with husband at home. At normal baseline this am, however, at 9:45am she was sitting in couch, told  husband that she felt hot and then found to have left sided weakness. EMS called, on arrival pt still  has left sided weakness, right forced gaze and left neglect. Reported nausea but no vomiting. BP 171/75 and Glucose 129. In ED, pt seems able to move left leg well but left arm flaccid. NIHSS = 14. CT no acute finding. INR 2.4. CTA showed right carotid mural thrombus with high grade stenosis. Given severe neuro deficit and right ICA [name] limiting on imaging, discussed with Dr. Corliss Skains, activated code IR. Pt is AAO x 3, discussed with her and she consented for IR procedure. She asked me to let her husband know by phone, but her husband is not picking up the phone.   LSN: [id number] am tPA Given: No: INR 2.4 IR: yes, given severe neuro deficit and right ICA [name] limiting on imaging mRS = 2  Past Medical History:  Diagnosis Date   Arthritis    Hyperlipidemia    Hypertension    Hyperuricemia    Osteoporosis    PAD (peripheral artery disease) (HCC) 02/2010   angiplasty, popliteal artery   Tobacco abuse     Past Surgical History:  Procedure Laterality Date   ANGIOPLASTY  July 2011   PAD- popliteal artery   COLONOSCOPY WITH PROPOFOL N/A 01/13/2018   Procedure: COLONOSCOPY WITH PROPOFOL;  Surgeon: Toney Reil, MD;  Location: [facility];  Service: Gastroenterology;  Laterality: N/A;   COLONOSCOPY WITH PROPOFOL N/A 01/14/2018   Procedure:  COLONOSCOPY WITH PROPOFOL;  Surgeon: Pasty Spillers, MD;  Location: [facility];  Service: Endoscopy;  Laterality: N/A;   COLONOSCOPY WITH PROPOFOL N/A 07/06/2018   Procedure: COLONOSCOPY WITH PROPOFOL;  Surgeon: Pasty Spillers, MD;  Location: [facility];  Service: Endoscopy;  Laterality: N/A;   CORONARY ANGIOPLASTY     JOINT REPLACEMENT     Leg Bypass Surgery Right 08/2010   OVARIAN CYST SURGERY  1980s   POPLITEAL ARTERY STENT Right 05/2010   POPLITEAL ARTERY STENT Right 07/2010   TOTAL HIP ARTHROPLASTY Left 10/27/2017   Procedure: TOTAL HIP ARTHROPLASTY;  Surgeon: Deeann Saint, MD;  Location: [facility];  Service: Orthopedics;  Laterality: Left;    Family History  Problem Relation Age of Onset   Arthritis Mother    Diabetes Mother    Stroke Mother    Hypertension Mother    Dementia Mother    Cancer Father    Arthritis Sister    Seizures Sister    Diabetes Sister    Hypertension Sister    COPD Sister    Cancer Brother    Stroke Maternal Grandmother    Diabetes Paternal Grandmother    Cancer Sister    Diabetes Sister    Arthritis Sister    Diabetes Brother    Stroke Brother    Healthy Brother    Diabetes Brother    Stroke Brother    Cancer Brother  Heart disease Neg Hx    Breast cancer Neg Hx     Social History:  reports that she has been smoking cigarettes. She has a 20.00 pack-year smoking history. She has never used smokeless tobacco. She reports that she does not drink alcohol and does not use drugs.  Allergies: No Known Allergies  No current facility-administered medications on file prior to encounter.   Current Outpatient Medications on File Prior to Encounter  Medication Sig Dispense Refill   acetaminophen (TYLENOL) 500 MG tablet Take 500 mg by mouth 2 (two) times daily as needed for moderate pain or headache.     amLODipine (NORVASC) 5 MG tablet TAKE 1 TABLET EVERY DAY 90 tablet 1   atorvastatin (LIPITOR) 10 MG tablet TAKE 1 TABLET AT  BEDTIME 90 tablet 0   cholecalciferol (VITAMIN D3) 25 MCG (1000 UNIT) tablet Take 2 tablets (2,000 Units total) by mouth daily.     nicotine (NICODERM CQ - DOSED IN MG/24 HR) 7 mg/24hr patch Place 1 patch (7 mg total) onto the skin daily. 28 patch 2   vitamin E 400 UNIT capsule Take 180 Units by mouth daily.      warfarin (COUMADIN) 3 MG tablet Take 1 tablet (3 mg) by mouth one day a week, other 6 days take 5 mg tab poqd 15 tablet 0   warfarin (COUMADIN) 5 MG tablet Take 1 tab (5 mg) po daily, 6 days a week, and one day a week take 3 mg tab 60 tablet 0    Review of Systems: A full ROS was attempted today and was able to be performed.  Systems assessed include - Constitutional, Eyes, HENT, Respiratory, Cardiovascular, Gastrointestinal, Genitourinary, Integument/breast, Hematologic/lymphatic, Musculoskeletal, Neurological, Behavioral/Psych, Endocrine, Allergic/Immunologic - with pertinent responses as per HPI.  Physical Examination: Temp:  [97.9 F (36.6 C)] 97.9 F (36.6 C) (05/29 1140) Pulse Rate:  [88] 88 (05/29 1153) Resp:  [13] 13 (05/29 1153) BP: (142-148)/(58-70) 148/70 (05/29 1151) SpO2:  [94 %] 94 % (05/29 1153) Weight:  [52.6 kg] 52.6 kg (05/29 1130)  General - well nourished, well developed, in no apparent distress.    Ophthalmologic - fundi not visualized due to noncooperation.    Cardiovascular - regular rhythm and rate  Neuro - awake, alert, eyes open, orientated to age, place, time. No aphasia, fluent language, following all simple commands. Able to name and repeat. Forced right gaze, tracking on the right visual field, left HH and visual neglect. Mild left facial droop. Tongue midline. RUE 5/5, no drift, left UE flaccid. RLE 5/5, LLE proximal 4+/5, distal ankle DF/PF 1/5 seems with neglect on the right. Sensation with left sensory neglect, right FTN intact, gait not tested.    Data Reviewed: CT HEAD CODE STROKE WO CONTRAST  Result Date: 01/27/2023 CLINICAL DATA:  Code  stroke.  Left-sided weakness, right-sided gaze. EXAM: CT ANGIOGRAPHY HEAD AND NECK TECHNIQUE: Multidetector CT imaging of the head and neck was performed using the standard protocol during bolus administration of intravenous contrast. Multiplanar CT image reconstructions and MIPs were obtained to evaluate the vascular anatomy. Carotid stenosis measurements (when applicable) are obtained utilizing NASCET criteria, using the distal internal carotid diameter as the denominator. RADIATION DOSE REDUCTION: This exam was performed according to the departmental dose-optimization program which includes automated exposure control, adjustment of the mA and/or kV according to patient size and/or use of iterative reconstruction technique. CONTRAST:  75 cc Omnipaque 350 COMPARISON:  CT head 09/29/2021 FINDINGS: CT HEAD FINDINGS Brain: There is no evidence  of acute intracranial hemorrhage, extra-axial fluid collection, or acute territorial infarct. Parenchymal volume is normal. The ventricles are normal in size. Gray-white differentiation is preserved. There is patchy hypodensity in the supratentorial white matter likely reflecting sequela of advanced chronic small-vessel ischemic change. Pituitary and suprasellar region are normal. There is no mass lesion there is no mass effect or midline shift. Vascular: No hyperdense vessel is seen. Skull: Normal. Negative for fracture or focal lesion. Sinuses/Orbits: The paranasal sinuses are clear. The globes and orbits are unremarkable. Other: None. ASPECTS [facility] Stroke Program Early CT Score) - Ganglionic level infarction (caudate, lentiform nuclei, internal capsule, insula, M1-M3 cortex): 7 - Supraganglionic infarction (M4-M6 cortex): 3 Total score (0-10 with 10 being normal): 10 CTA NECK FINDINGS Aortic arch: There is mild calcified plaque in the imaged aortic arch. The origins of the major branch vessels are patent. The subclavian arteries are patent to the level imaged. Right  carotid system: The right common carotid artery is patent. There is mixed plaque of the bifurcation with noncalcified plaque projecting into the lumen resulting in hemodynamically significant stenosis just after the bifurcation, estimated at 60%. Distally, there is additional mural adherent clot in the lumen of the ICA resulting in high-grade stenosis, estimated at 80%. There is no raised dissection flap. The distal internal carotid artery is patent. The external carotid artery is patent. There is no evidence of aneurysm/pseudoaneurysm. Left carotid system: The left common carotid artery is patent. There is calcified plaque at the bifurcation without hemodynamically significant stenosis or occlusion. The internal and external carotid arteries are patent. There is no evidence of dissection or aneurysm/pseudoaneurysm. Vertebral arteries: The vertebral arteries are patent, without hemodynamically significant stenosis or occlusion. There is no evidence of dissection or aneurysm/pseudoaneurysm. Skeleton: There is no acute osseous abnormality or suspicious osseous lesion. There is no visible canal hematoma. Other neck: There is extensive dental disease. The soft tissues of the neck are unremarkable. Upper chest: There is emphysema in the lung apices. Review of the MIP images confirms the above findings CTA HEAD FINDINGS Anterior circulation: There is calcified plaque in the intracranial ICAs without significant stenosis or occlusion. The right M1 segment is patent. The branch vessels are patent, without proximal stenosis or occlusion. The left MCA is patent, without proximal stenosis or occlusion. The bilateral ACAs are patent, without proximal stenosis or occlusion. There is no aneurysm or AVM. Posterior circulation: The bilateral V4 segments are patent. The basilar artery is patent. The major cerebellar arteries appear patent. The bilateral PCAs are patent, without proximal stenosis or occlusion. There is a fetal origin  of the right PCA. A left posterior communicating artery is also identified. There is no aneurysm or AVM. Venous sinuses: Patent. Anatomic variants: As above. Review of the MIP images confirms the above findings IMPRESSION: 1. No acute intracranial pathology.  No evidence of evolved infarct. 2. Mural adherent clot in the right internal carotid artery after the bifurcation resulting in high-grade stenosis, estimated at 80%. 3. More proximally in the right internal carotid artery just after the bifurcation, mixed plaque results in proximally 60% stenosis. 4. Mild plaque at the left carotid bifurcation without significant stenosis. Patent vertebral arteries in the neck. 5. Patent intracranial vasculature. 6. Emphysema. Findings of the noncontrast head CT were communicated to Dr Roda Shutters at 11:32 am. The the CTA results were discussed at 11:43 am. Electronically Signed   By: Lesia Hausen M.D.   On: 01/27/2023 11:57   CT ANGIO HEAD NECK W WO CM (CODE  STROKE)  Result Date: 01/27/2023 CLINICAL DATA:  Code stroke.  Left-sided weakness, right-sided gaze. EXAM: CT ANGIOGRAPHY HEAD AND NECK TECHNIQUE: Multidetector CT imaging of the head and neck was performed using the standard protocol during bolus administration of intravenous contrast. Multiplanar CT image reconstructions and MIPs were obtained to evaluate the vascular anatomy. Carotid stenosis measurements (when applicable) are obtained utilizing NASCET criteria, using the distal internal carotid diameter as the denominator. RADIATION DOSE REDUCTION: This exam was performed according to the departmental dose-optimization program which includes automated exposure control, adjustment of the mA and/or kV according to patient size and/or use of iterative reconstruction technique. CONTRAST:  75 cc Omnipaque 350 COMPARISON:  CT head 09/29/2021 FINDINGS: CT HEAD FINDINGS Brain: There is no evidence of acute intracranial hemorrhage, extra-axial fluid collection, or acute territorial  infarct. Parenchymal volume is normal. The ventricles are normal in size. Gray-white differentiation is preserved. There is patchy hypodensity in the supratentorial white matter likely reflecting sequela of advanced chronic small-vessel ischemic change. Pituitary and suprasellar region are normal. There is no mass lesion there is no mass effect or midline shift. Vascular: No hyperdense vessel is seen. Skull: Normal. Negative for fracture or focal lesion. Sinuses/Orbits: The paranasal sinuses are clear. The globes and orbits are unremarkable. Other: None. ASPECTS [facility] Stroke Program Early CT Score) - Ganglionic level infarction (caudate, lentiform nuclei, internal capsule, insula, M1-M3 cortex): 7 - Supraganglionic infarction (M4-M6 cortex): 3 Total score (0-10 with 10 being normal): 10 CTA NECK FINDINGS Aortic arch: There is mild calcified plaque in the imaged aortic arch. The origins of the major branch vessels are patent. The subclavian arteries are patent to the level imaged. Right carotid system: The right common carotid artery is patent. There is mixed plaque of the bifurcation with noncalcified plaque projecting into the lumen resulting in hemodynamically significant stenosis just after the bifurcation, estimated at 60%. Distally, there is additional mural adherent clot in the lumen of the ICA resulting in high-grade stenosis, estimated at 80%. There is no raised dissection flap. The distal internal carotid artery is patent. The external carotid artery is patent. There is no evidence of aneurysm/pseudoaneurysm. Left carotid system: The left common carotid artery is patent. There is calcified plaque at the bifurcation without hemodynamically significant stenosis or occlusion. The internal and external carotid arteries are patent. There is no evidence of dissection or aneurysm/pseudoaneurysm. Vertebral arteries: The vertebral arteries are patent, without hemodynamically significant stenosis or occlusion.  There is no evidence of dissection or aneurysm/pseudoaneurysm. Skeleton: There is no acute osseous abnormality or suspicious osseous lesion. There is no visible canal hematoma. Other neck: There is extensive dental disease. The soft tissues of the neck are unremarkable. Upper chest: There is emphysema in the lung apices. Review of the MIP images confirms the above findings CTA HEAD FINDINGS Anterior circulation: There is calcified plaque in the intracranial ICAs without significant stenosis or occlusion. The right M1 segment is patent. The branch vessels are patent, without proximal stenosis or occlusion. The left MCA is patent, without proximal stenosis or occlusion. The bilateral ACAs are patent, without proximal stenosis or occlusion. There is no aneurysm or AVM. Posterior circulation: The bilateral V4 segments are patent. The basilar artery is patent. The major cerebellar arteries appear patent. The bilateral PCAs are patent, without proximal stenosis or occlusion. There is a fetal origin of the right PCA. A left posterior communicating artery is also identified. There is no aneurysm or AVM. Venous sinuses: Patent. Anatomic variants: As above. Review of the  MIP images confirms the above findings IMPRESSION: 1. No acute intracranial pathology.  No evidence of evolved infarct. 2. Mural adherent clot in the right internal carotid artery after the bifurcation resulting in high-grade stenosis, estimated at 80%. 3. More proximally in the right internal carotid artery just after the bifurcation, mixed plaque results in proximally 60% stenosis. 4. Mild plaque at the left carotid bifurcation without significant stenosis. Patent vertebral arteries in the neck. 5. Patent intracranial vasculature. 6. Emphysema. Findings of the noncontrast head CT were communicated to Dr Roda Shutters at 11:32 am. The the CTA results were discussed at 11:43 am. Electronically Signed   By: Lesia Hausen M.D.   On: 01/27/2023 11:57    Assessment: 72  y.o. female PMH of PAD, CAD, smoker, HTN, HLD, coagulation disorder on coumadin presented for left sided weakness, right forced gaze and left neglect. Time onset 9:45am. NIHSS = 14. CT no acute finding. INR 2.4. CTA showed right carotid mural thrombus with high grade stenosis. Not TNK candidate due to high INR. Given severe neuro deficit and right ICA [name] limiting on imaging, discussed with Dr. Corliss Skains, activated code IR. Pt is AAO x 3, consented the procedure by herself. Will go for IR now and then admit to ICU.    Stroke Risk Factors - hyperlipidemia, hypertension, and PAD and coagulation disorder  Plan: IR for thrombectomy now and then ICU admission Continue further stroke work up including MRI, echo, UDS, fasting lipid panel and HgbA1C Frequent neuro checks Telemetry monitoring PT/OT/speech consult BP goal 120-160 post IR with recannulization  GI and DVT prophylaxis  Quit smoking May consider ASA 81 if needed, no other antithrombotics needed at this time Discussed with Dr. Melvenia Beam to call husband but not available. Will call him later   This patient is critically ill due to acute stroke with right ICA thrombus and high grade stenosis with [name] limiting, s/p thrombectomy and at significant risk of neurological worsening, death form recurrent stroke, hemorrhagic conversion, seizure, heart failure. This patient's care requires constant monitoring of vital signs, hemodynamics, respiratory and cardiac monitoring, review of multiple databases, neurological assessment, discussion with family, other specialists and medical decision making of high complexity. I spent 50 minutes of neurocritical care time in the care of this patient.  Marvel Plan, MD PhD Stroke Neurology 01/27/2023 1:02 PM

## 2023-01-27 NOTE — ED Provider Notes (Signed)
EMERGENCY DEPARTMENT AT [facility] Provider Note   CSN: [id number] Arrival date & time: 01/27/23  1117  An emergency department physician performed an initial assessment on this suspected stroke patient at 1120.  History  Chief Complaint  Patient presents with   Code Stroke    Mary [name] is a 72 y.o. female.  HPI Patient presents for strokelike symptoms.  Medical history includes PAD, HTN, HLD, CAD, osteoporosis.  She is on warfarin.  This morning, she awoke in her normal state of health.  At 9:45 AM, she told her husband that she felt hot.  She then was noted to have left-sided weakness.  When EMS arrived on scene, patient had flaccid left hemibody and hemineglect.  Vital signs were notable for hypertension.  Patient arrives as a code stroke.    Home Medications Prior to Admission medications   Medication Sig Start Date End Date Taking? Authorizing Provider  acetaminophen (TYLENOL) 500 MG tablet Take 500 mg by mouth 2 (two) times daily as needed for moderate pain or headache.    [provider]  amLODipine (NORVASC) 5 MG tablet TAKE 1 TABLET EVERY DAY 01/07/23   Danelle Berry, PA-C  atorvastatin (LIPITOR) 10 MG tablet TAKE 1 TABLET AT BEDTIME 12/31/22   Danelle Berry, PA-C  cholecalciferol (VITAMIN D3) 25 MCG (1000 UNIT) tablet Take 2 tablets (2,000 Units total) by mouth daily. 01/10/21   Danelle Berry, PA-C  nicotine (NICODERM CQ - DOSED IN MG/24 HR) 7 mg/24hr patch Place 1 patch (7 mg total) onto the skin daily. 10/06/22   Danelle Berry, PA-C  vitamin E 400 UNIT capsule Take 180 Units by mouth daily.     [provider]  warfarin (COUMADIN) 3 MG tablet Take 1 tablet (3 mg) by mouth one day a week, other 6 days take 5 mg tab poqd 01/06/23   Danelle Berry, PA-C  warfarin (COUMADIN) 5 MG tablet Take 1 tab (5 mg) po daily, 6 days a week, and one day a week take 3 mg tab 01/06/23   Danelle Berry, PA-C      Allergies    Patient has no known allergies.     Review of Systems   Review of Systems  Unable to perform ROS: Mental status change    Physical Exam Updated Vital Signs BP 120/68   Pulse 71   Temp (!) 97.2 F (36.2 C)   Resp 14   Ht 5\' 4"  (1.626 m)   Wt 52.6 kg   SpO2 96%   BMI 19.90 kg/m  Physical Exam Vitals and nursing note reviewed.  Constitutional:      General: She is not in acute distress.    Appearance: Normal appearance. She is well-developed. She is ill-appearing. She is not toxic-appearing or diaphoretic.  HENT:     Head: Normocephalic and atraumatic.     Right Ear: External ear normal.     Left Ear: External ear normal.     Nose: Nose normal.  Eyes:     Conjunctiva/sclera: Conjunctivae normal.  Cardiovascular:     Rate and Rhythm: Normal rate and regular rhythm.  Pulmonary:     Effort: Pulmonary effort is normal. No respiratory distress.  Abdominal:     General: There is no distension.     Palpations: Abdomen is soft.  Musculoskeletal:        General: No swelling or deformity.     Cervical back: Normal range of motion and neck supple.  Skin:  General: Skin is warm and dry.     Capillary Refill: Capillary refill takes less than 2 seconds.     Coloration: Skin is not jaundiced or pale.  Neurological:     Mental Status: She is alert.     Comments: Left hemibody weakness and hemineglect.  Psychiatric:        Mood and Affect: Mood normal.     ED Results / Procedures / Treatments   Labs (all labs ordered are listed, but only abnormal results are displayed) Labs Reviewed  PROTIME-INR - Abnormal; Notable for the following components:      Result Value   Prothrombin Time 26.5 (*)    INR 2.4 (*)    All other components within normal limits  DIFFERENTIAL - Abnormal; Notable for the following components:   Lymphs Abs 6.7 (*)    All other components within normal limits  COMPREHENSIVE METABOLIC PANEL - Abnormal; Notable for the following components:   Potassium 3.0 (*)    Glucose, Bld 127 (*)     All other components within normal limits  GLUCOSE, CAPILLARY - Abnormal; Notable for the following components:   Glucose-Capillary 146 (*)    All other components within normal limits  I-STAT CHEM 8, ED - Abnormal; Notable for the following components:   Potassium 2.9 (*)    Glucose, Bld 121 (*)    Calcium, Ion 1.13 (*)    All other components within normal limits  CBG MONITORING, ED - Abnormal; Notable for the following components:   Glucose-Capillary 128 (*)    All other components within normal limits  SARS CORONAVIRUS 2 BY RT PCR  MRSA NEXT GEN BY PCR, NASAL  APTT  CBC  ETHANOL  HIV ANTIBODY (ROUTINE TESTING W REFLEX)  LIPID PANEL  HEMOGLOBIN A1C  PROTIME-INR  CBC WITH DIFFERENTIAL/PLATELET  BASIC METABOLIC PANEL    EKG EKG Interpretation  Date/Time:  Wednesday Jan 27 2023 11:51:13 EDT Ventricular Rate:  88 PR Interval:  143 QRS Duration: 79 QT Interval:  351 QTC Calculation: 425 R Axis:   28 Text Interpretation: Sinus rhythm Atrial premature complexes Probable left atrial enlargement Confirmed by Gloris Manchester [phone]) on 01/27/2023 6:16:09 PM  Radiology CT HEAD CODE STROKE WO CONTRAST  Result Date: 01/27/2023 CLINICAL DATA:  Code stroke.  Left-sided weakness, right-sided gaze. EXAM: CT ANGIOGRAPHY HEAD AND NECK TECHNIQUE: Multidetector CT imaging of the head and neck was performed using the standard protocol during bolus administration of intravenous contrast. Multiplanar CT image reconstructions and MIPs were obtained to evaluate the vascular anatomy. Carotid stenosis measurements (when applicable) are obtained utilizing NASCET criteria, using the distal internal carotid diameter as the denominator. RADIATION DOSE REDUCTION: This exam was performed according to the departmental dose-optimization program which includes automated exposure control, adjustment of the mA and/or kV according to patient size and/or use of iterative reconstruction technique. CONTRAST:  75 cc  Omnipaque 350 COMPARISON:  CT head 09/29/2021 FINDINGS: CT HEAD FINDINGS Brain: There is no evidence of acute intracranial hemorrhage, extra-axial fluid collection, or acute territorial infarct. Parenchymal volume is normal. The ventricles are normal in size. Gray-white differentiation is preserved. There is patchy hypodensity in the supratentorial white matter likely reflecting sequela of advanced chronic small-vessel ischemic change. Pituitary and suprasellar region are normal. There is no mass lesion there is no mass effect or midline shift. Vascular: No hyperdense vessel is seen. Skull: Normal. Negative for fracture or focal lesion. Sinuses/Orbits: The paranasal sinuses are clear. The globes and orbits are unremarkable. Other:  None. ASPECTS ([place] Stroke Program Early CT Score) - Ganglionic level infarction (caudate, lentiform nuclei, internal capsule, insula, M1-M3 cortex): 7 - Supraganglionic infarction (M4-M6 cortex): 3 Total score (0-10 with 10 being normal): 10 CTA NECK FINDINGS Aortic arch: There is mild calcified plaque in the imaged aortic arch. The origins of the major branch vessels are patent. The subclavian arteries are patent to the level imaged. Right carotid system: The right common carotid artery is patent. There is mixed plaque of the bifurcation with noncalcified plaque projecting into the lumen resulting in hemodynamically significant stenosis just after the bifurcation, estimated at 60%. Distally, there is additional mural adherent clot in the lumen of the ICA resulting in high-grade stenosis, estimated at 80%. There is no raised dissection flap. The distal internal carotid artery is patent. The external carotid artery is patent. There is no evidence of aneurysm/pseudoaneurysm. Left carotid system: The left common carotid artery is patent. There is calcified plaque at the bifurcation without hemodynamically significant stenosis or occlusion. The internal and external carotid arteries are  patent. There is no evidence of dissection or aneurysm/pseudoaneurysm. Vertebral arteries: The vertebral arteries are patent, without hemodynamically significant stenosis or occlusion. There is no evidence of dissection or aneurysm/pseudoaneurysm. Skeleton: There is no acute osseous abnormality or suspicious osseous lesion. There is no visible canal hematoma. Other neck: There is extensive dental disease. The soft tissues of the neck are unremarkable. Upper chest: There is emphysema in the lung apices. Review of the MIP images confirms the above findings CTA HEAD FINDINGS Anterior circulation: There is calcified plaque in the intracranial ICAs without significant stenosis or occlusion. The right M1 segment is patent. The branch vessels are patent, without proximal stenosis or occlusion. The left MCA is patent, without proximal stenosis or occlusion. The bilateral ACAs are patent, without proximal stenosis or occlusion. There is no aneurysm or AVM. Posterior circulation: The bilateral V4 segments are patent. The basilar artery is patent. The major cerebellar arteries appear patent. The bilateral PCAs are patent, without proximal stenosis or occlusion. There is a fetal origin of the right PCA. A left posterior communicating artery is also identified. There is no aneurysm or AVM. Venous sinuses: Patent. Anatomic variants: As above. Review of the MIP images confirms the above findings IMPRESSION: 1. No acute intracranial pathology.  No evidence of evolved infarct. 2. Mural adherent clot in the right internal carotid artery after the bifurcation resulting in high-grade stenosis, estimated at 80%. 3. More proximally in the right internal carotid artery just after the bifurcation, mixed plaque results in proximally 60% stenosis. 4. Mild plaque at the left carotid bifurcation without significant stenosis. Patent vertebral arteries in the neck. 5. Patent intracranial vasculature. 6. Emphysema. Findings of the noncontrast  head CT were communicated to Dr Roda Shutters at 11:32 am. The the CTA results were discussed at 11:43 am. Electronically Signed   By: Lesia Hausen M.D.   On: 01/27/2023 11:57   CT ANGIO HEAD NECK W WO CM (CODE STROKE)  Result Date: 01/27/2023 CLINICAL DATA:  Code stroke.  Left-sided weakness, right-sided gaze. EXAM: CT ANGIOGRAPHY HEAD AND NECK TECHNIQUE: Multidetector CT imaging of the head and neck was performed using the standard protocol during bolus administration of intravenous contrast. Multiplanar CT image reconstructions and MIPs were obtained to evaluate the vascular anatomy. Carotid stenosis measurements (when applicable) are obtained utilizing NASCET criteria, using the distal internal carotid diameter as the denominator. RADIATION DOSE REDUCTION: This exam was performed according to the departmental dose-optimization program which includes  automated exposure control, adjustment of the mA and/or kV according to patient size and/or use of iterative reconstruction technique. CONTRAST:  75 cc Omnipaque 350 COMPARISON:  CT head 09/29/2021 FINDINGS: CT HEAD FINDINGS Brain: There is no evidence of acute intracranial hemorrhage, extra-axial fluid collection, or acute territorial infarct. Parenchymal volume is normal. The ventricles are normal in size. Gray-white differentiation is preserved. There is patchy hypodensity in the supratentorial white matter likely reflecting sequela of advanced chronic small-vessel ischemic change. Pituitary and suprasellar region are normal. There is no mass lesion there is no mass effect or midline shift. Vascular: No hyperdense vessel is seen. Skull: Normal. Negative for fracture or focal lesion. Sinuses/Orbits: The paranasal sinuses are clear. The globes and orbits are unremarkable. Other: None. ASPECTS [facility] Stroke Program Early CT Score) - Ganglionic level infarction (caudate, lentiform nuclei, internal capsule, insula, M1-M3 cortex): 7 - Supraganglionic infarction (M4-M6  cortex): 3 Total score (0-10 with 10 being normal): 10 CTA NECK FINDINGS Aortic arch: There is mild calcified plaque in the imaged aortic arch. The origins of the major branch vessels are patent. The subclavian arteries are patent to the level imaged. Right carotid system: The right common carotid artery is patent. There is mixed plaque of the bifurcation with noncalcified plaque projecting into the lumen resulting in hemodynamically significant stenosis just after the bifurcation, estimated at 60%. Distally, there is additional mural adherent clot in the lumen of the ICA resulting in high-grade stenosis, estimated at 80%. There is no raised dissection flap. The distal internal carotid artery is patent. The external carotid artery is patent. There is no evidence of aneurysm/pseudoaneurysm. Left carotid system: The left common carotid artery is patent. There is calcified plaque at the bifurcation without hemodynamically significant stenosis or occlusion. The internal and external carotid arteries are patent. There is no evidence of dissection or aneurysm/pseudoaneurysm. Vertebral arteries: The vertebral arteries are patent, without hemodynamically significant stenosis or occlusion. There is no evidence of dissection or aneurysm/pseudoaneurysm. Skeleton: There is no acute osseous abnormality or suspicious osseous lesion. There is no visible canal hematoma. Other neck: There is extensive dental disease. The soft tissues of the neck are unremarkable. Upper chest: There is emphysema in the lung apices. Review of the MIP images confirms the above findings CTA HEAD FINDINGS Anterior circulation: There is calcified plaque in the intracranial ICAs without significant stenosis or occlusion. The right M1 segment is patent. The branch vessels are patent, without proximal stenosis or occlusion. The left MCA is patent, without proximal stenosis or occlusion. The bilateral ACAs are patent, without proximal stenosis or occlusion.  There is no aneurysm or AVM. Posterior circulation: The bilateral V4 segments are patent. The basilar artery is patent. The major cerebellar arteries appear patent. The bilateral PCAs are patent, without proximal stenosis or occlusion. There is a fetal origin of the right PCA. A left posterior communicating artery is also identified. There is no aneurysm or AVM. Venous sinuses: Patent. Anatomic variants: As above. Review of the MIP images confirms the above findings IMPRESSION: 1. No acute intracranial pathology.  No evidence of evolved infarct. 2. Mural adherent clot in the right internal carotid artery after the bifurcation resulting in high-grade stenosis, estimated at 80%. 3. More proximally in the right internal carotid artery just after the bifurcation, mixed plaque results in proximally 60% stenosis. 4. Mild plaque at the left carotid bifurcation without significant stenosis. Patent vertebral arteries in the neck. 5. Patent intracranial vasculature. 6. Emphysema. Findings of the noncontrast head CT were communicated  to Dr Roda Shutters at 11:32 am. The the CTA results were discussed at 11:43 am. Electronically Signed   By: Lesia Hausen M.D.   On: 01/27/2023 11:57    Procedures Procedures    Medications Ordered in ED Medications  sodium chloride flush (NS) 0.9 % injection 3 mL (has no administration in time range)   stroke: early stages of recovery book (has no administration in time range)  0.9 %  sodium chloride infusion (has no administration in time range)  acetaminophen (TYLENOL) tablet 650 mg (has no administration in time range)    Or  acetaminophen (TYLENOL) 160 MG/5ML solution 650 mg (has no administration in time range)    Or  acetaminophen (TYLENOL) suppository 650 mg (has no administration in time range)  senna-docusate (Senokot-S) tablet 1 tablet (has no administration in time range)  clevidipine (CLEVIPREX) infusion 0.5 mg/mL (has no administration in time range)  0.9 %  sodium chloride  infusion (has no administration in time range)  aspirin chewable tablet 81 mg (has no administration in time range)    Or  aspirin chewable tablet 81 mg (has no administration in time range)  Chlorhexidine Gluconate Cloth 2 % PADS 6 each (has no administration in time range)  iohexol (OMNIPAQUE) 350 MG/ML injection 75 mL (75 mLs Intravenous Contrast Given 01/27/23 1135)  iohexol (OMNIPAQUE) 300 MG/ML solution 150 mL (84 mLs Intra-arterial Contrast Given 01/27/23 1257)  aspirin suppository 300 mg (300 mg Rectal Given 01/27/23 1308)    ED Course/ Medical Decision Making/ A&P                             Medical Decision Making Amount and/or Complexity of Data Reviewed Labs: ordered. Radiology: ordered.  Risk Decision regarding hospitalization.   This patient presents to the ED for concern of strokelike symptoms, this involves an extensive number of treatment options, and is a complaint that carries with it a high risk of complications and morbidity.  The differential diagnosis includes CVA, TIA, ICH, seizure, complex migraine, intoxication   Co morbidities that complicate the patient evaluation  PAD, HTN, HLD, CAD, osteoporosis   Additional history obtained:  Additional history obtained from EMS External records from outside source obtained and reviewed including EMR   Lab Tests:  I Ordered, and personally interpreted labs.  The pertinent results include: Hemoglobin, no leukocytosis, hypokalemia with otherwise normal electrolytes   Imaging Studies ordered:  I ordered imaging studies including CT head, CTA head and neck I independently visualized and interpreted imaging which showed multiple areas of stenoses I agree with the radiologist interpretation   Cardiac Monitoring: / EKG:  The patient was maintained on a cardiac monitor.  I personally viewed and interpreted the cardiac monitored which showed an underlying rhythm of: Sinus rhythm   Consultations Obtained:  I  requested consultation with the neurologist, Dr. Roda Shutters,  and discussed lab and imaging findings as well as pertinent plan - they recommend: IR thrombectomy followed by neuro ICU admission   Problem List / ED Course / Critical interventions / Medication management  Patient arrives as a code stroke due to left-sided weakness since 9:45 AM this morning.  Left-sided weakness is continued on her arrival.  Patient has left hemineglect.  Airway is intact.  She was taken directly to CT scanner.  Following CT imaging, neurology plans for IR thrombectomy followed by neuro ICU admission.  Patient was transported to IR for further management.   Social Determinants  of Health:  Has PCP        Final Clinical Impression(s) / ED Diagnoses Final diagnoses:  Stroke-like symptoms    Rx / DC Orders ED Discharge Orders     None         Gloris Manchester, MD 01/27/23 1818

## 2023-01-27 NOTE — Progress Notes (Cosign Needed Addendum)
Patient 72 yo female who developed left sided weakness, right forced gaze and left neglect, brought to ED as Code Stroke, she is s/p R ICA thrombectomy by Dr. Corliss Skains today.   Patient seen in ICU with Dr. Corliss Skains.  She is a/o to self, thinks she is at [facility] ED. Able to follow commands and move all 4 exts.  Still has right gaze.  R CFA puncture site soft, no appreciable hematoma or pseudoaneurysm, pt on right knee immobilizer.   Please call NIR for questions and concerns. NIR will follow.      Lynann Bologna Audryanna Zurita PA-C 01/27/2023 3:52 PM

## 2023-01-27 NOTE — Sedation Documentation (Addendum)
At 1316 sheath pulled and manual pressure held x38minutes

## 2023-01-27 NOTE — Anesthesia Postprocedure Evaluation (Signed)
Anesthesia Post Note  Patient: Mary [name]  Procedure(s) Performed: IR WITH ANESTHESIA     Patient location during evaluation: PACU Anesthesia Type: General Level of consciousness: awake and alert, patient cooperative and oriented Pain management: pain level controlled Vital Signs Assessment: post-procedure vital signs reviewed and stable Respiratory status: spontaneous breathing, nonlabored ventilation, respiratory function stable and patient connected to nasal cannula oxygen Cardiovascular status: blood pressure returned to baseline and stable Postop Assessment: no apparent nausea or vomiting Anesthetic complications: no Comments: Pt moving L arm in PACU   No notable events documented.  Last Vitals:  Vitals:   01/27/23 1418 01/27/23 1430  BP:    Pulse: 78 78  Resp: 17 17  Temp:    SpO2:  95%    Last Pain:  Vitals:   01/27/23 1417  TempSrc:   PainSc: 0-No pain                 Chellsie Gomer,E. Jamey Harman

## 2023-01-27 NOTE — Anesthesia Procedure Notes (Signed)
Procedure Name: Intubation Date/Time: 01/27/2023 12:02 PM  Performed by: Rachel Moulds, CRNAPre-anesthesia Checklist: Patient identified, Emergency Drugs available, Suction available, Timeout performed and Patient being monitored Patient Re-evaluated:Patient Re-evaluated prior to induction Oxygen Delivery Method: Circle system utilized Preoxygenation: Pre-oxygenation with 100% oxygen Induction Type: IV induction Ventilation: Mask ventilation without difficulty Laryngoscope Size: Mac and 3 Grade View: Grade I Tube type: Oral Tube size: 7.0 mm Number of attempts: 1 Airway Equipment and Method: Stylet Placement Confirmation: ETT inserted through vocal cords under direct vision, positive ETCO2, CO2 detector and breath sounds checked- equal and bilateral Secured at: 21 cm Tube secured with: Tape Dental Injury: Teeth and Oropharynx as per pre-operative assessment

## 2023-01-27 NOTE — Anesthesia Preprocedure Evaluation (Signed)
Anesthesia Evaluation  Patient identified by MRN, date of birth, ID band Patient awake    Reviewed: Allergy & Precautions, NPO status , Patient's Chart, lab work & pertinent test results  History of Anesthesia Complications Negative for: history of anesthetic complications  Airway Mallampati: II  TM Distance: >3 FB Neck ROM: Full    Dental  (+) Missing, Poor Dentition, Dental Advisory Given   Pulmonary Current SmokerPatient did not abstain from smoking.   breath sounds clear to auscultation       Cardiovascular hypertension, Pt. on medications (-) angina + CAD and + Peripheral Vascular Disease   Rhythm:Regular Rate:Normal     Neuro/Psych CVA (acute CVA: L sided weakness), Residual Symptoms    GI/Hepatic negative GI ROS, Neg liver ROS,,,  Endo/Other  negative endocrine ROS    Renal/GU negative Renal ROS     Musculoskeletal   Abdominal   Peds  Hematology Coumadin: INR 2.4   Anesthesia Other Findings   Reproductive/Obstetrics                              Anesthesia Physical Anesthesia Plan  ASA: 3 and emergent  Anesthesia Plan: General   Post-op Pain Management: Minimal or no pain anticipated   Induction: Intravenous and Rapid sequence  PONV Risk Score and Plan: 2 and Ondansetron, Dexamethasone and Treatment may vary due to age or medical condition  Airway Management Planned: Oral ETT  Additional Equipment: Arterial line  Intra-op Plan:   Post-operative Plan: Possible Post-op intubation/ventilation  Informed Consent: I have reviewed the patients History and Physical, chart, labs and discussed the procedure including the risks, benefits and alternatives for the proposed anesthesia with the patient or authorized representative who has indicated his/her understanding and acceptance.     Dental advisory given and Only emergency history available  Plan Discussed with: CRNA and  Surgeon  Anesthesia Plan Comments: (Pt gave verbal consent in IR suite)         Anesthesia Quick Evaluation

## 2023-01-27 NOTE — Transfer of Care (Signed)
Immediate Anesthesia Transfer of Care Note  Patient: Mary [name]  Procedure(s) Performed: IR WITH ANESTHESIA  Patient Location: PACU  Anesthesia Type:General  Level of Consciousness: awake and drowsy  Airway & Oxygen Therapy: Patient Spontanous Breathing and Patient connected to nasal cannula oxygen  Post-op Assessment: Report given to RN and Post -op Vital signs reviewed and stable  Post vital signs: Reviewed and stable  Last Vitals:  Vitals Value Taken Time  BP 133/66 01/27/23 1417  Temp    Pulse 82 01/27/23 1422  Resp 16 01/27/23 1422  SpO2 96 % 01/27/23 1422  Vitals shown include unvalidated device data.  Last Pain:  Vitals:   01/27/23 1140  TempSrc: Oral         Complications: No notable events documented.

## 2023-01-27 NOTE — Progress Notes (Signed)
Orthopedic Tech Progress Note Patient Details:  Mary [name] July 04, 1951 [id number]  IR RN called requesting a KNEE IMMOBILIZER be dropped off to [facility]3.  Ortho Devices Type of Ortho Device: Knee Immobilizer Ortho Device/Splint Interventions: Other (comment)   Post Interventions Patient Tolerated: Other (comment) Instructions Provided: Other (comment)  [name] 01/27/2023, 1:59 PM

## 2023-01-27 NOTE — Procedures (Signed)
INR.  Status post right common carotid fusion.  Right CFA approach.  Findings.  Large near occlusive thrombus in the proximal ttwo third of the right internal carotid artery.  Status post complete revascularization of the  right internal carotid artery with 1 pass with a 6.5 mm x 47 mm" preoperative arterial device with proximal flow radius and aspiration achieving complete revascularization.    Right MCA demonstrates a TICI 3 revascularization.  Residual approximately 55-60  % stenosis of the right internal carotid at the bulb with good antegrade flow intracranially.  CT of the brain no evidence of intracranial hemorrhage.  Manual compression held with quick clot for 25 minutes at  the right groin puncture site.  Distal pulses all dopplerable unchanged from prior to procedure.  Patient extubated..  Slow to wake up.  Opens eyes and moves RT side to command.  S.Shariece Viveiros MD

## 2023-01-27 NOTE — Anesthesia Procedure Notes (Signed)
Arterial Line Insertion Start/End5/29/2024 12:12 PM, 01/27/2023 12:14 PM Performed by: Jairo Ben, MD  Preanesthetic checklist: patient identified, IV checked, site marked, risks and benefits discussed, surgical consent, monitors and equipment checked, pre-op evaluation, timeout performed and anesthesia consent Left, radial was placed Catheter size: 20 G Hand hygiene performed  and maximum sterile barriers used  Allen's test indicative of satisfactory collateral circulation Attempts: 1 Procedure performed without using ultrasound guided technique. Following insertion, Biopatch and dressing applied. Post procedure assessment: normal  Patient tolerated the procedure well with no immediate complications.

## 2023-01-27 NOTE — Progress Notes (Signed)
Pt on unit. NIH performed by this RN and noticed sensory differences. ED RN stated only weakness on LU/E. Stroke RN called and en route to bedside

## 2023-01-27 NOTE — ED Triage Notes (Signed)
Pt arrives via [facility] from home as an activated Code stroke. Pt LKW is 0945 this morning. Pt had a sudden onset of left sided weakness and right sided gaze. Pt arrives AxOx4.

## 2023-01-28 ENCOUNTER — Inpatient Hospital Stay (HOSPITAL_COMMUNITY): Payer: Medicare PPO

## 2023-01-28 ENCOUNTER — Encounter (HOSPITAL_COMMUNITY): Payer: Self-pay | Admitting: Radiology

## 2023-01-28 DIAGNOSIS — I639 Cerebral infarction, unspecified: Secondary | ICD-10-CM

## 2023-01-28 DIAGNOSIS — I6389 Other cerebral infarction: Secondary | ICD-10-CM

## 2023-01-28 DIAGNOSIS — I63131 Cerebral infarction due to embolism of right carotid artery: Secondary | ICD-10-CM | POA: Diagnosis not present

## 2023-01-28 LAB — ECHOCARDIOGRAM COMPLETE
AR max vel: 1.59 cm2
AV Area VTI: 1.23 cm2
AV Area mean vel: 1.42 cm2
AV Mean grad: 4 mmHg
AV Peak grad: 7.3 mmHg
Ao pk vel: 1.35 m/s
Area-P 1/2: 4.06 cm2
Calc EF: 67.6 %
Height: 64 in
S' Lateral: 2.5 cm
Single Plane A2C EF: 72.8 %
Single Plane A4C EF: 62.2 %
Weight: 1802.48 oz

## 2023-01-28 LAB — CBC WITH DIFFERENTIAL/PLATELET
Abs Immature Granulocytes: 0.02 10*3/uL (ref 0.00–0.07)
Basophils Absolute: 0 10*3/uL (ref 0.0–0.1)
Basophils Relative: 0 %
Eosinophils Absolute: 0 10*3/uL (ref 0.0–0.5)
Eosinophils Relative: 0 %
HCT: 35 % — ABNORMAL LOW (ref 36.0–46.0)
Hemoglobin: 11.9 g/dL — ABNORMAL LOW (ref 12.0–15.0)
Immature Granulocytes: 0 %
Lymphocytes Relative: 25 %
Lymphs Abs: 1.6 10*3/uL (ref 0.7–4.0)
MCH: 29.3 pg (ref 26.0–34.0)
MCHC: 34 g/dL (ref 30.0–36.0)
MCV: 86.2 fL (ref 80.0–100.0)
Monocytes Absolute: 0.4 10*3/uL (ref 0.1–1.0)
Monocytes Relative: 7 %
Neutro Abs: 4.4 10*3/uL (ref 1.7–7.7)
Neutrophils Relative %: 68 %
Platelets: 201 10*3/uL (ref 150–400)
RBC: 4.06 MIL/uL (ref 3.87–5.11)
RDW: 13.3 % (ref 11.5–15.5)
WBC: 6.5 10*3/uL (ref 4.0–10.5)
nRBC: 0 % (ref 0.0–0.2)

## 2023-01-28 LAB — PROTIME-INR
INR: 2.2 — ABNORMAL HIGH (ref 0.8–1.2)
Prothrombin Time: 24.9 seconds — ABNORMAL HIGH (ref 11.4–15.2)

## 2023-01-28 LAB — HIV ANTIBODY (ROUTINE TESTING W REFLEX): HIV Screen 4th Generation wRfx: NONREACTIVE

## 2023-01-28 LAB — BASIC METABOLIC PANEL
Anion gap: 10 (ref 5–15)
BUN: 8 mg/dL (ref 8–23)
CO2: 23 mmol/L (ref 22–32)
Calcium: 8.4 mg/dL — ABNORMAL LOW (ref 8.9–10.3)
Chloride: 101 mmol/L (ref 98–111)
Creatinine, Ser: 0.75 mg/dL (ref 0.44–1.00)
GFR, Estimated: >60 mL/min (ref >60)
Glucose, Bld: 113 mg/dL — ABNORMAL HIGH (ref 70–99)
Potassium: 3.5 mmol/L (ref 3.5–5.1)
Sodium: 134 mmol/L — ABNORMAL LOW (ref 135–145)

## 2023-01-28 LAB — HEMOGLOBIN A1C
Hgb A1c MFr Bld: 6.2 % — ABNORMAL HIGH (ref 4.8–5.6)
Mean Plasma Glucose: 131 mg/dL

## 2023-01-28 LAB — LIPID PANEL
Cholesterol: 115 mg/dL (ref 0–200)
HDL: 51 mg/dL (ref >40)
LDL Cholesterol: 53 mg/dL (ref 0–99)
Total CHOL/HDL Ratio: 2.3 RATIO
Triglycerides: 57 mg/dL (ref <150)
VLDL: 11 mg/dL (ref 0–40)

## 2023-01-28 LAB — GLUCOSE, CAPILLARY
Glucose-Capillary: 106 mg/dL — ABNORMAL HIGH (ref 70–99)
Glucose-Capillary: 82 mg/dL (ref 70–99)
Glucose-Capillary: 105 mg/dL — ABNORMAL HIGH (ref 70–99)

## 2023-01-28 MED ORDER — LABETALOL HCL 5 MG/ML IV SOLN: 10.0000 mg | INTRAVENOUS | Status: DC | PRN

## 2023-01-28 MED ORDER — ASPIRIN 300 MG RE SUPP
300.0000 mg | Freq: Every day | RECTAL | Status: DC
  Filled 2023-01-28: qty 1

## 2023-01-28 MED ORDER — POTASSIUM CHLORIDE CRYS ER 20 MEQ PO TBCR: 40.0000 meq | EXTENDED_RELEASE_TABLET | Freq: Once | ORAL | Status: DC

## 2023-01-28 MED ORDER — ASPIRIN 325 MG PO TABS
325.0000 mg | ORAL_TABLET | Freq: Every day | ORAL | Status: DC
  Administered 2023-01-28: 325 mg via ORAL
  Filled 2023-01-28: qty 1

## 2023-01-28 MED ORDER — WARFARIN SODIUM 6 MG PO TABS
6.0000 mg | ORAL_TABLET | Freq: Once | ORAL | Status: AC
  Administered 2023-01-28: 6 mg via ORAL
  Filled 2023-01-28: qty 1

## 2023-01-28 MED ORDER — ATORVASTATIN CALCIUM 10 MG PO TABS
10.0000 mg | ORAL_TABLET | Freq: Every day | ORAL | Status: DC
  Administered 2023-01-28 – 2023-02-01 (×5): 10 mg via ORAL
  Filled 2023-01-28 (×5): qty 1

## 2023-01-28 MED ORDER — WARFARIN - PHARMACIST DOSING INPATIENT: Freq: Every day | Status: DC

## 2023-01-28 MED ORDER — POTASSIUM CHLORIDE 20 MEQ PO PACK
40.0000 meq | PACK | Freq: Once | ORAL | Status: AC
  Administered 2023-01-28: 40 meq via ORAL
  Filled 2023-01-28: qty 2

## 2023-01-28 MED ORDER — NICOTINE 7 MG/24HR TD PT24
7.0000 mg | MEDICATED_PATCH | Freq: Every day | TRANSDERMAL | Status: DC
  Administered 2023-01-28 – 2023-02-02 (×6): 7 mg via TRANSDERMAL
  Filled 2023-01-28 (×7): qty 1

## 2023-01-28 MED ORDER — ASPIRIN 325 MG PO TBEC
325.0000 mg | DELAYED_RELEASE_TABLET | Freq: Every day | ORAL | Status: DC
  Administered 2023-01-29 – 2023-01-30 (×2): 325 mg via ORAL
  Filled 2023-01-28 (×2): qty 1

## 2023-01-28 NOTE — Progress Notes (Signed)
Inpatient Rehab Admissions Coordinator Note:   Per therapy recommendations patient was screened for CIR candidacy by Stephania Fragmin, PT. At this time, pt appears to be a potential candidate for CIR. I will place an order for rehab consult for full assessment, per our protocol.  Please contact me any with questions.Estill Dooms, PT, DPT [phone] 01/28/23 4:00 PM

## 2023-01-28 NOTE — Progress Notes (Addendum)
ANTICOAGULATION CONSULT NOTE  Pharmacy Consult:  Coumadin Indication:  PVD  No Known Allergies  Patient Measurements: Height: 5\' 4"  (162.6 cm) Weight: 51.1 kg (112 lb 10.5 oz) IBW/kg (Calculated) : 54.7  Vital Signs: Temp: 98.8 F (37.1 C) (05/30 0717) Temp Source: Oral (05/30 0717) BP: 139/91 (05/30 1100) Pulse Rate: 68 (05/30 1100)  Labs: Recent Labs    01/27/23 1126 01/27/23 1128 01/28/23 0250  HGB 13.1 13.9 11.9*  HCT 39.3 41.0 35.0*  PLT 243  --  201  APTT 30  --   --   LABPROT 26.5*  --  24.9*  INR 2.4*  --  2.2*  CREATININE 0.89 0.80 0.75    Estimated Creatinine Clearance: 53.5 mL/min (by C-G formula based on SCr of 0.75 mg/dL).   Medical History: Past Medical History:  Diagnosis Date   Arthritis    Hyperlipidemia    Hypertension    Hyperuricemia    Osteoporosis    PAD (peripheral artery disease) (HCC) 02/2010   angiplasty, popliteal artery   Tobacco abuse     Assessment: 71 YOF on Coumadin 5mg  PO daily except 3mg  one day per week PTA for history of PVD.  INR was therapeutic at 2.4 and patient presented with scattered infarcts.  INR goal increased to 2.5-3.5 per Neuro.  No hemorrhage and Pharmacy consulted to resume Coumadin.  ASA also started.  INR currently sub-therapeutic at 2.2; no bleeding reported.  Goal of Therapy:  INR goal 2.5-3.5 per Neuro Monitor platelets by anticoagulation protocol: Yes   Plan:  Coumadin 6mg  PO today Daily PT / INR Stop ASA when INR >/= 2.5  Shamira Toutant D. Laney Potash, PharmD, BCPS, BCCCP 01/28/2023, 1:19 PM

## 2023-01-28 NOTE — Procedures (Signed)
Objective Swallowing Evaluation: Type of Study: FEES-Fiberoptic Endoscopic Evaluation of Swallow   Patient Details  Name: Mary [name] MRN: [id number] Date of Birth: 28-Sep-1950  Today's Date: 01/28/2023 Time: SLP Start Time (ACUTE ONLY): 1130 -SLP Stop Time (ACUTE ONLY): 1148  SLP Time Calculation (min) (ACUTE ONLY): 18 min   Past Medical History:  Past Medical History:  Diagnosis Date   Arthritis    Hyperlipidemia    Hypertension    Hyperuricemia    Osteoporosis    PAD (peripheral artery disease) (HCC) 02/2010   angiplasty, popliteal artery   Tobacco abuse    Past Surgical History:  Past Surgical History:  Procedure Laterality Date   ANGIOPLASTY  July 2011   PAD- popliteal artery   COLONOSCOPY WITH PROPOFOL N/A 01/13/2018   Procedure: COLONOSCOPY WITH PROPOFOL;  Surgeon: Toney Reil, MD;  Location: [facility] END[facility];  Service: Gastroenterology;  Laterality: N/A;   COLONOSCOPY WITH PROPOFOL N/A 01/14/2018   Procedure: COLONOSCOPY WITH PROPOFOL;  Surgeon: Pasty Spillers, MD;  Location: [facility] END[facility]COPY;  Service: Endoscopy;  Laterality: N/A;   COLONOSCOPY WITH PROPOFOL N/A 07/06/2018   Procedure: COLONOSCOPY WITH PROPOFOL;  Surgeon: Pasty Spillers, MD;  Location: [facility] ENDOSCOPY;  Service: Endoscopy;  Laterality: N/A;   CORONARY ANGIOPLASTY     JOINT REPLACEMENT     Leg Bypass Surgery Right 08/2010   OVARIAN CYST SURGERY  1980s   POPLITEAL ARTERY STENT Right 05/2010   POPLITEAL ARTERY STENT Right 07/2010   RADIOLOGY WITH ANESTHESIA N/A 01/27/2023   Procedure: IR WITH ANESTHESIA;  Surgeon: Radiologist, Medication, MD;  Location: [facility] OR;  Service: Radiology;  Laterality: N/A;   TOTAL HIP ARTHROPLASTY Left 10/27/2017   Procedure: TOTAL HIP ARTHROPLASTY;  Surgeon: Deeann Saint, MD;  Location: [facility] ORS;  Service: Orthopedics;  Laterality: Left;   HPI: Mary [name] is a 72 y.o. African American female who presented to ED for code stroke.  Now s/p thrombectomy with IR.  MRI 5/29: "Scattered acute infarcts throughout the right cerebral hemisphere, with more focal areas of infarction in the right frontal and parietal cortex, watershed territory, right caudate and lentiform nucleus, and right occipital lobe." Pt with PMH of PAD, CAD, smoker, HTN, HLD, coagulation disorder on coumadin   Subjective: Pt awake, alert, pleasant, participative    Recommendations for follow up therapy are one component of a multi-disciplinary discharge planning process, led by the attending physician.  Recommendations may be updated based on patient status, additional functional criteria and insurance authorization.  Assessment / Plan / Recommendation     01/28/2023    1:24 PM  Clinical Impressions  Clinical Impression Pt presents with functional oropharyngeal swallowing.  There was shallow transient penetration of thin liquid dairy on single occasion prior to the swallow.  This fully cleared during swallow and was not observed on any additional trials.  There was diffuse mild residue which was cleared with sponteous secondary swallow.  Pt required double swallow with regular solid 2/2 piecemeal deglutition.  Pt is safe to initiate an oral diet and does not have further ST needs for swallowing.    Recommend regular texture solids with thin liquids.   SLP Visit Diagnosis Dysphagia, unspecified (R13.10)  Impact on safety and function No limitations         01/28/2023    1:24 PM  Treatment Recommendations  Treatment Recommendations No treatment recommended at this time        01/28/2023    1:28 PM  Prognosis  Prognosis for improved  oropharyngeal function --       01/28/2023    1:24 PM  Diet Recommendations  SLP Diet Recommendations Regular solids;Thin liquid  Liquid Administration via Cup;Straw  Medication Administration Whole meds with liquid  Compensations Small sips/bites;Slow rate  Postural Changes Seated upright at 90 degrees         01/28/2023    1:24 PM  Other  Recommendations  Oral Care Recommendations Oral care BID  Follow Up Recommendations No SLP follow up  Functional Status Assessment Patient has not had a recent decline in their functional status       01/28/2023    1:24 PM  Frequency and Duration   Speech Therapy Frequency (ACUTE ONLY) --         01/28/2023    1:17 PM  Oral Phase  Oral Phase [facility]       01/28/2023    1:17 PM  Pharyngeal Phase  Pharyngeal Phase Impaired  Pharyngeal- Thin Straw WFL  Pharyngeal Material does not enter airway  Pharyngeal- Puree WFL  Pharyngeal Material does not enter airway  Pharyngeal- Regular Delayed swallow initiation-vallecula  Pharyngeal Material does not enter airway        01/28/2023    1:24 PM  Cervical Esophageal Phase   Cervical Esophageal Phase [facility]     Kerrie Pleasure, MA, CCC-SLP Acute Rehabilitation Services Office: [phone] 01/28/2023, 1:29 PM

## 2023-01-28 NOTE — Progress Notes (Signed)
Patient had R ICA thrombectomy on 01/27/23. TOC following.

## 2023-01-28 NOTE — Progress Notes (Signed)
Referring Physician(s): Code Stroke  Supervising Physician: Julieanne Cotton  Patient Status:  [facility] - In-pt  Chief Complaint: R ICA occlusion  Subjective: Patient reclined in bed.  Conversant, >75% of questions answered correctly.  Moving all extremities.  L side delayed but strength ultimately intact.  Right sided gaze preference.   Allergies: Patient has no known allergies.  Medications: Prior to Admission medications   Medication Sig Start Date End Date Taking? Authorizing Provider  amLODipine (NORVASC) 5 MG tablet TAKE 1 TABLET EVERY DAY 01/07/23  Yes Danelle Berry, PA-C  atorvastatin (LIPITOR) 10 MG tablet TAKE 1 TABLET AT BEDTIME 12/31/22  Yes Danelle Berry, PA-C  cholecalciferol (VITAMIN D3) 25 MCG (1000 UNIT) tablet Take 2 tablets (2,000 Units total) by mouth daily. 01/10/21  Yes Danelle Berry, PA-C  vitamin E 400 UNIT capsule Take 180 Units by mouth daily.    Yes [provider]  warfarin (COUMADIN) 3 MG tablet Take 1 tablet (3 mg) by mouth one day a week, other 6 days take 5 mg tab poqd 01/06/23  Yes Danelle Berry, PA-C  warfarin (COUMADIN) 5 MG tablet Take 1 tab (5 mg) po daily, 6 days a week, and one day a week take 3 mg tab 01/06/23  Yes Danelle Berry, PA-C  acetaminophen (TYLENOL) 500 MG tablet Take 500 mg by mouth 2 (two) times daily as needed for moderate pain or headache.    [provider]  nicotine (NICODERM CQ - DOSED IN MG/24 HR) 7 mg/24hr patch Place 1 patch (7 mg total) onto the skin daily. Patient not taking: Reported on 01/27/2023 10/06/22   Danelle Berry, PA-C     Vital Signs: BP 127/67 (BP Location: Right Arm)   Pulse 68   Temp 98.8 F (37.1 C) (Oral)   Resp 17   Ht 5\' 4"  (1.626 m)   Wt 112 lb 10.5 oz (51.1 kg)   SpO2 98%   BMI 19.34 kg/m   Physical Exam NAD, alert Neuro: EOMs intact, right gaze preference-- does not cross midline during visit, moving all extremities.  Hand grip strength 4/5, left lower 5/5 Skin: c/d/I.  No oozing.   Small area of erythema without evidence of hematoma or pseudoaneurysm.  Imaging: ECHOCARDIOGRAM COMPLETE  Result Date: 01/28/2023    ECHOCARDIOGRAM REPORT   Patient Name:   MASIYA BABICZ Date of Exam: 01/28/2023 Medical Rec #:  [id number]     Height:       64.0 in Accession #:    [id number]    Weight:       112.7 lb Date of Birth:  Jan 24, 1951      BSA:          1.533 m Patient Age:    72 years      BP:           124/71 mmHg Patient Gender: F             HR:           68 bpm. Exam Location:  Inpatient Procedure: 2D Echo, Color Doppler and Cardiac Doppler Indications:    Stroke  History:        Patient has prior history of Echocardiogram examinations and                 Patient has no prior history of Echocardiogram examinations.                 Right ICA occlusion, PAD, Smoker, HTN, HLD, CAD.  Sonographer:    Milbert Coulter Referring Phys: [id number] JINDONG XU IMPRESSIONS  1. Left ventricular ejection fraction, by estimation, is 55 to 60%. The left ventricle has normal function. The left ventricle has no regional wall motion abnormalities. Left ventricular diastolic parameters are consistent with Grade I diastolic dysfunction (impaired relaxation).  2. Right ventricular systolic function is normal. The right ventricular size is normal. Tricuspid regurgitation signal is inadequate for assessing PA pressure.  3. The mitral valve is grossly normal. Trivial mitral valve regurgitation.  4. The aortic valve is tricuspid. There is mild calcification of the aortic valve. There is mild thickening of the aortic valve. Aortic valve regurgitation is not visualized. Aortic valve sclerosis/calcification is present, without any evidence of aortic stenosis.  5. The inferior vena cava is normal in size with greater than 50% respiratory variability, suggesting right atrial pressure of 3 mmHg. Comparison(s): No prior Echocardiogram. Conclusion(s)/Recommendation(s): No intracardiac source of embolism detected on this transthoracic study.  Consider a transesophageal echocardiogram to exclude cardiac source of embolism if clinically indicated. FINDINGS  Left Ventricle: Left ventricular ejection fraction, by estimation, is 55 to 60%. The left ventricle has normal function. The left ventricle has no regional wall motion abnormalities. The left ventricular internal cavity size was normal in size. There is  no left ventricular hypertrophy. Left ventricular diastolic parameters are consistent with Grade I diastolic dysfunction (impaired relaxation). Right Ventricle: The right ventricular size is normal. No increase in right ventricular wall thickness. Right ventricular systolic function is normal. Tricuspid regurgitation signal is inadequate for assessing PA pressure. Left Atrium: Left atrial size was normal in size. Right Atrium: Right atrial size was normal in size. Pericardium: There is no evidence of pericardial effusion. Mitral Valve: The mitral valve is grossly normal. Trivial mitral valve regurgitation. Tricuspid Valve: The tricuspid valve is normal in structure. Tricuspid valve regurgitation is trivial. Aortic Valve: The aortic valve is tricuspid. There is mild calcification of the aortic valve. There is mild thickening of the aortic valve. Aortic valve regurgitation is not visualized. Aortic valve sclerosis/calcification is present, without any evidence of aortic stenosis. Aortic valve mean gradient measures 4.0 mmHg. Aortic valve peak gradient measures 7.3 mmHg. Aortic valve area, by VTI measures 1.23 cm. Pulmonic Valve: The pulmonic valve was not well visualized. Pulmonic valve regurgitation is trivial. Aorta: The aortic root is normal in size and structure. Venous: The inferior vena cava is normal in size with greater than 50% respiratory variability, suggesting right atrial pressure of 3 mmHg. IAS/Shunts: The atrial septum is grossly normal.  LEFT VENTRICLE PLAX 2D LVIDd:         3.60 cm     Diastology LVIDs:         2.50 cm     LV e' medial:     9.68 cm/s LV PW:         1.10 cm     LV E/e' medial:  8.9 LV IVS:        1.00 cm     LV e' lateral:   12.70 cm/s LVOT diam:     1.70 cm     LV E/e' lateral: 6.7 LV SV:         41 LV SV Index:   27 LVOT Area:     2.27 cm  LV Volumes (MOD) LV vol d, MOD A2C: 38.2 ml LV vol d, MOD A4C: 42.6 ml LV vol s, MOD A2C: 10.4 ml LV vol s, MOD A4C: 16.1 ml LV SV MOD A2C:  27.8 ml LV SV MOD A4C:     42.6 ml LV SV MOD BP:      28.3 ml RIGHT VENTRICLE RV Basal diam:  2.70 cm RV Mid diam:    2.30 cm RV S prime:     11.70 cm/s TAPSE (M-mode): 2.3 cm LEFT ATRIUM             Index        RIGHT ATRIUM           Index LA diam:        2.50 cm 1.63 cm/m   RA Area:     12.10 cm LA Vol (A2C):   34.1 ml 22.25 ml/m  RA Volume:   27.70 ml  18.07 ml/m LA Vol (A4C):   27.2 ml 17.74 ml/m LA Biplane Vol: 31.1 ml 20.29 ml/m  AORTIC VALVE AV Area (Vmax):    1.59 cm AV Area (Vmean):   1.42 cm AV Area (VTI):     1.23 cm AV Vmax:           135.00 cm/s AV Vmean:          97.300 cm/s AV VTI:            0.331 m AV Peak Grad:      7.3 mmHg AV Mean Grad:      4.0 mmHg LVOT Vmax:         94.60 cm/s LVOT Vmean:        60.700 cm/s LVOT VTI:          0.179 m LVOT/AV VTI ratio: 0.54  AORTA Ao Root diam: 3.10 cm Ao Asc diam:  2.50 cm MITRAL VALVE MV Area (PHT): 4.06 cm    SHUNTS MV Decel Time: 187 msec    Systemic VTI:  0.18 m MV E velocity: 85.70 cm/s  Systemic Diam: 1.70 cm MV A velocity: 85.70 cm/s MV E/A ratio:  1.00 Laurance Flatten MD Electronically signed by Laurance Flatten MD Signature Date/Time: 01/28/2023/3:48:06 PM    Final    VAS US CAROTID  Result Date: 01/28/2023 Carotid Arterial Duplex Study Patient Name:  KALLA ZUPKO  Date of Exam:   01/28/2023 Medical Rec #: [id number]      Accession #:    [id number] Date of Birth: 1951-01-08       Patient Gender: F Patient Age:   64 years Exam Location:  [facility] Procedure:      VAS US CAROTID Referring Phys: Scheryl Marten XU  --------------------------------------------------------------------------------  Indications:       CVA, Weakness and right carotid mural thrombus, status post                    thrombectomy with residual 55-60% ICA stenosis. Risk Factors:      Hypertension, hyperlipidemia, current smoker, coronary artery                    disease, PAD. Other Factors:     On Coumadin for clotting disorder. Comparison Study:  No prior study Performing Technologist: Sherren Kerns RVS  Examination Guidelines: A complete evaluation includes B-mode imaging, spectral Doppler, color Doppler, and power Doppler as needed of all accessible portions of each vessel. Bilateral testing is considered an integral part of a complete examination. Limited examinations for reoccurring indications may be performed as noted.  Right Carotid Findings: +----------+--------+--------+--------+------------------+------------------+           PSV cm/sEDV cm/sStenosisPlaque DescriptionComments           +----------+--------+--------+--------+------------------+------------------+  CCA Prox  89      17                                intimal thickening +----------+--------+--------+--------+------------------+------------------+ CCA Mid                           focal and calcific                   +----------+--------+--------+--------+------------------+------------------+ CCA Distal82      17                                intimal thickening +----------+--------+--------+--------+------------------+------------------+ ICA Prox  77      20      1-39%   calcific          Shadowing          +----------+--------+--------+--------+------------------+------------------+ ICA Mid   108     20                                                   +----------+--------+--------+--------+------------------+------------------+ ICA Distal91      27                                                    +----------+--------+--------+--------+------------------+------------------+ ECA       117     11                                                   +----------+--------+--------+--------+------------------+------------------+ +----------+--------+-------+--------+-------------------+           PSV cm/sEDV cmsDescribeArm Pressure (mmHG) +----------+--------+-------+--------+-------------------+ Subclavian70                                         +----------+--------+-------+--------+-------------------+ +---------+--------+--+--------+--+ VertebralPSV cm/s70EDV cm/s14 +---------+--------+--+--------+--+  Left Carotid Findings: +----------+--------+--------+--------+------------------+------------------+           PSV cm/sEDV cm/sStenosisPlaque DescriptionComments           +----------+--------+--------+--------+------------------+------------------+ CCA Prox  85      21                                intimal thickening +----------+--------+--------+--------+------------------+------------------+ CCA Distal81      16                                intimal thickening +----------+--------+--------+--------+------------------+------------------+ ICA Prox  82      23      1-39%   calcific                             +----------+--------+--------+--------+------------------+------------------+ ICA Mid   77      21                                                   +----------+--------+--------+--------+------------------+------------------+  ICA Distal75      23                                                   +----------+--------+--------+--------+------------------+------------------+ ECA       77      12                                                   +----------+--------+--------+--------+------------------+------------------+ +----------+--------+--------+--------+-------------------+           PSV cm/sEDV cm/sDescribeArm Pressure  (mmHG) +----------+--------+--------+--------+-------------------+ [id number]                                          +----------+--------+--------+--------+-------------------+ +---------+--------+--+--------+--+ VertebralPSV cm/s72EDV cm/s16 +---------+--------+--+--------+--+   Summary: Right Carotid: Velocities in the right ICA are consistent with a 1-39% stenosis. Left Carotid: Velocities in the left ICA are consistent with a 1-39% stenosis. Vertebrals:  Bilateral vertebral arteries demonstrate antegrade flow. Subclavians: Normal flow hemodynamics were seen in bilateral subclavian              arteries. *See table(s) above for measurements and observations.     Preliminary    MR BRAIN WO CONTRAST  Result Date: 01/27/2023 CLINICAL DATA:  Stroke, follow-up EXAM: MRI HEAD WITHOUT CONTRAST TECHNIQUE: Multiplanar, multiecho pulse sequences of the brain and surrounding structures were obtained without intravenous contrast. COMPARISON:  No prior MRI available, correlation is made with CT head 01/27/2023 FINDINGS: Brain: Scattered foci of restricted diffusion with ADC correlate throughout the right cerebral hemisphere, with more focal and contiguous areas of restricted diffusion in the right frontal and parietal cortex (series 5, image 89), watershed territory (series 5, image 85), right caudate and lentiform nucleus (series 5, image 78), and right occipital lobe (series 5, image 68-70). No acute hemorrhage, mass, mass effect, or midline shift. No hydrocephalus or extra-axial collection. No hemosiderin deposition to suggest remote hemorrhage. Scattered and confluent T2 hyperintense signal in the periventricular white matter and pons, likely the sequela of moderate to severe chronic small vessel ischemic disease. Normal cerebral volume for age. Vascular: Normal arterial flow voids. Skull and upper cervical spine: Normal marrow signal. Sinuses/Orbits: Mucosal thickening in the left ethmoid air cells.  No acute finding in the orbits. Other: The mastoids are well aerated. IMPRESSION: Scattered acute infarcts throughout the right cerebral hemisphere, with more focal areas of infarction in the right frontal and parietal cortex, watershed territory, right caudate and lentiform nucleus, and right occipital lobe. These results will be called to the ordering clinician or representative by the Radiologist Assistant, and communication documented in the PACS or [employer]. Electronically Signed   By: Wiliam Ke M.D.   On: 01/27/2023 23:45   CT HEAD CODE STROKE WO CONTRAST  Result Date: 01/27/2023 CLINICAL DATA:  Code stroke.  Left-sided weakness, right-sided gaze. EXAM: CT ANGIOGRAPHY HEAD AND NECK TECHNIQUE: Multidetector CT imaging of the head and neck was performed using the standard protocol during bolus administration of intravenous contrast. Multiplanar CT image reconstructions and MIPs were obtained to evaluate the vascular anatomy. Carotid stenosis measurements (when applicable) are obtained utilizing NASCET criteria, using  the distal internal carotid diameter as the denominator. RADIATION DOSE REDUCTION: This exam was performed according to the departmental dose-optimization program which includes automated exposure control, adjustment of the mA and/or kV according to patient size and/or use of iterative reconstruction technique. CONTRAST:  75 cc Omnipaque 350 COMPARISON:  CT head 09/29/2021 FINDINGS: CT HEAD FINDINGS Brain: There is no evidence of acute intracranial hemorrhage, extra-axial fluid collection, or acute territorial infarct. Parenchymal volume is normal. The ventricles are normal in size. Gray-white differentiation is preserved. There is patchy hypodensity in the supratentorial white matter likely reflecting sequela of advanced chronic small-vessel ischemic change. Pituitary and suprasellar region are normal. There is no mass lesion there is no mass effect or midline shift. Vascular: No  hyperdense vessel is seen. Skull: Normal. Negative for fracture or focal lesion. Sinuses/Orbits: The paranasal sinuses are clear. The globes and orbits are unremarkable. Other: None. ASPECTS [facility] Stroke Program Early CT Score) - Ganglionic level infarction (caudate, lentiform nuclei, internal capsule, insula, M1-M3 cortex): 7 - Supraganglionic infarction (M4-M6 cortex): 3 Total score (0-10 with 10 being normal): 10 CTA NECK FINDINGS Aortic arch: There is mild calcified plaque in the imaged aortic arch. The origins of the major branch vessels are patent. The subclavian arteries are patent to the level imaged. Right carotid system: The right common carotid artery is patent. There is mixed plaque of the bifurcation with noncalcified plaque projecting into the lumen resulting in hemodynamically significant stenosis just after the bifurcation, estimated at 60%. Distally, there is additional mural adherent clot in the lumen of the ICA resulting in high-grade stenosis, estimated at 80%. There is no raised dissection flap. The distal internal carotid artery is patent. The external carotid artery is patent. There is no evidence of aneurysm/pseudoaneurysm. Left carotid system: The left common carotid artery is patent. There is calcified plaque at the bifurcation without hemodynamically significant stenosis or occlusion. The internal and external carotid arteries are patent. There is no evidence of dissection or aneurysm/pseudoaneurysm. Vertebral arteries: The vertebral arteries are patent, without hemodynamically significant stenosis or occlusion. There is no evidence of dissection or aneurysm/pseudoaneurysm. Skeleton: There is no acute osseous abnormality or suspicious osseous lesion. There is no visible canal hematoma. Other neck: There is extensive dental disease. The soft tissues of the neck are unremarkable. Upper chest: There is emphysema in the lung apices. Review of the MIP images confirms the above findings CTA  HEAD FINDINGS Anterior circulation: There is calcified plaque in the intracranial ICAs without significant stenosis or occlusion. The right M1 segment is patent. The branch vessels are patent, without proximal stenosis or occlusion. The left MCA is patent, without proximal stenosis or occlusion. The bilateral ACAs are patent, without proximal stenosis or occlusion. There is no aneurysm or AVM. Posterior circulation: The bilateral V4 segments are patent. The basilar artery is patent. The major cerebellar arteries appear patent. The bilateral PCAs are patent, without proximal stenosis or occlusion. There is a fetal origin of the right PCA. A left posterior communicating artery is also identified. There is no aneurysm or AVM. Venous sinuses: Patent. Anatomic variants: As above. Review of the MIP images confirms the above findings IMPRESSION: 1. No acute intracranial pathology.  No evidence of evolved infarct. 2. Mural adherent clot in the right internal carotid artery after the bifurcation resulting in high-grade stenosis, estimated at 80%. 3. More proximally in the right internal carotid artery just after the bifurcation, mixed plaque results in proximally 60% stenosis. 4. Mild plaque at the left carotid bifurcation  without significant stenosis. Patent vertebral arteries in the neck. 5. Patent intracranial vasculature. 6. Emphysema. Findings of the noncontrast head CT were communicated to Dr Roda Shutters at 11:32 am. The the CTA results were discussed at 11:43 am. Electronically Signed   By: Lesia Hausen M.D.   On: 01/27/2023 11:57   CT ANGIO HEAD NECK W WO CM (CODE STROKE)  Result Date: 01/27/2023 CLINICAL DATA:  Code stroke.  Left-sided weakness, right-sided gaze. EXAM: CT ANGIOGRAPHY HEAD AND NECK TECHNIQUE: Multidetector CT imaging of the head and neck was performed using the standard protocol during bolus administration of intravenous contrast. Multiplanar CT image reconstructions and MIPs were obtained to evaluate the  vascular anatomy. Carotid stenosis measurements (when applicable) are obtained utilizing NASCET criteria, using the distal internal carotid diameter as the denominator. RADIATION DOSE REDUCTION: This exam was performed according to the departmental dose-optimization program which includes automated exposure control, adjustment of the mA and/or kV according to patient size and/or use of iterative reconstruction technique. CONTRAST:  75 cc Omnipaque 350 COMPARISON:  CT head 09/29/2021 FINDINGS: CT HEAD FINDINGS Brain: There is no evidence of acute intracranial hemorrhage, extra-axial fluid collection, or acute territorial infarct. Parenchymal volume is normal. The ventricles are normal in size. Gray-white differentiation is preserved. There is patchy hypodensity in the supratentorial white matter likely reflecting sequela of advanced chronic small-vessel ischemic change. Pituitary and suprasellar region are normal. There is no mass lesion there is no mass effect or midline shift. Vascular: No hyperdense vessel is seen. Skull: Normal. Negative for fracture or focal lesion. Sinuses/Orbits: The paranasal sinuses are clear. The globes and orbits are unremarkable. Other: None. ASPECTS [facility] Stroke Program Early CT Score) - Ganglionic level infarction (caudate, lentiform nuclei, internal capsule, insula, M1-M3 cortex): 7 - Supraganglionic infarction (M4-M6 cortex): 3 Total score (0-10 with 10 being normal): 10 CTA NECK FINDINGS Aortic arch: There is mild calcified plaque in the imaged aortic arch. The origins of the major branch vessels are patent. The subclavian arteries are patent to the level imaged. Right carotid system: The right common carotid artery is patent. There is mixed plaque of the bifurcation with noncalcified plaque projecting into the lumen resulting in hemodynamically significant stenosis just after the bifurcation, estimated at 60%. Distally, there is additional mural adherent clot in the lumen of the  ICA resulting in high-grade stenosis, estimated at 80%. There is no raised dissection flap. The distal internal carotid artery is patent. The external carotid artery is patent. There is no evidence of aneurysm/pseudoaneurysm. Left carotid system: The left common carotid artery is patent. There is calcified plaque at the bifurcation without hemodynamically significant stenosis or occlusion. The internal and external carotid arteries are patent. There is no evidence of dissection or aneurysm/pseudoaneurysm. Vertebral arteries: The vertebral arteries are patent, without hemodynamically significant stenosis or occlusion. There is no evidence of dissection or aneurysm/pseudoaneurysm. Skeleton: There is no acute osseous abnormality or suspicious osseous lesion. There is no visible canal hematoma. Other neck: There is extensive dental disease. The soft tissues of the neck are unremarkable. Upper chest: There is emphysema in the lung apices. Review of the MIP images confirms the above findings CTA HEAD FINDINGS Anterior circulation: There is calcified plaque in the intracranial ICAs without significant stenosis or occlusion. The right M1 segment is patent. The branch vessels are patent, without proximal stenosis or occlusion. The left MCA is patent, without proximal stenosis or occlusion. The bilateral ACAs are patent, without proximal stenosis or occlusion. There is no aneurysm or AVM.  Posterior circulation: The bilateral V4 segments are patent. The basilar artery is patent. The major cerebellar arteries appear patent. The bilateral PCAs are patent, without proximal stenosis or occlusion. There is a fetal origin of the right PCA. A left posterior communicating artery is also identified. There is no aneurysm or AVM. Venous sinuses: Patent. Anatomic variants: As above. Review of the MIP images confirms the above findings IMPRESSION: 1. No acute intracranial pathology.  No evidence of evolved infarct. 2. Mural adherent clot  in the right internal carotid artery after the bifurcation resulting in high-grade stenosis, estimated at 80%. 3. More proximally in the right internal carotid artery just after the bifurcation, mixed plaque results in proximally 60% stenosis. 4. Mild plaque at the left carotid bifurcation without significant stenosis. Patent vertebral arteries in the neck. 5. Patent intracranial vasculature. 6. Emphysema. Findings of the noncontrast head CT were communicated to Dr Roda Shutters at 11:32 am. The the CTA results were discussed at 11:43 am. Electronically Signed   By: Lesia Hausen M.D.   On: 01/27/2023 11:57    Labs:  CBC: Recent Labs    10/06/22 1038 01/04/23 1127 01/27/23 1126 01/27/23 1128 01/28/23 0250  WBC 5.5 6.2 9.5  --  6.5  HGB 13.7 13.9 13.1 13.9 11.9*  HCT 39.8 40.3 39.3 41.0 35.0*  PLT 266 271 243  --  201    COAGS: Recent Labs    01/04/23 1127 01/15/23 1126 01/27/23 1126 01/28/23 0250  INR 1.9* 2.2* 2.4* 2.2*  APTT  --   --  30  --     BMP: Recent Labs    10/06/22 1038 01/04/23 1127 01/27/23 1126 01/27/23 1128 01/28/23 0250  NA 141 140 136 139 134*  K 3.8 4.7 3.0* 2.9* 3.5  CL 104 104 101 99 101  CO2 26 27 25   --  23  GLUCOSE 71 80 127* 121* 113*  BUN 12 10 9 10 8   CALCIUM 9.5 9.3 9.0  --  8.4*  CREATININE 0.82 0.93 0.89 0.80 0.75  GFRNONAA  --   --  >60  --  >60    LIVER FUNCTION TESTS: Recent Labs    03/06/22 1056 10/06/22 1038 01/04/23 1127 01/27/23 1126  BILITOT 0.4 0.3 0.4 0.4  AST 18 18 20 24   ALT 12 11 10 16   ALKPHOS  --   --   --  70  PROT 6.8 7.0 7.1 7.4  ALBUMIN  --   --   --  3.8    Assessment and Plan: R ICA occlusion s/p thrombectomy achieving TICI 3 revascularization Patient doing well this AM.  Right gaze preference.  Moving all extremities.  She is on Eliquis, no DAPT.  IR to follow for disposition as patient is in need of R ICA stenting.  Best course of action would be to admit to 4N day of discharge from rehab (CIR being  considered) for stent placement. IR following.   Electronically Signed: Hoyt Koch, PA 01/28/2023, 4:03 PM   I spent a total of 15 Minutes at the the patient's bedside AND on the patient's hospital floor or unit, greater than 50% of which was counseling/coordinating care for R ICA occlusion.

## 2023-01-28 NOTE — Progress Notes (Signed)
Bladder scan 151ml

## 2023-01-28 NOTE — Progress Notes (Signed)
Patient admitted to [facility] room [phone], per MD order. Report received by Harrold Donath, RN. Patient oriented to room and educated on notifying staff for any assistance. Patient verbalized understanding and in agreement. VSS. RA. Respirations are even and unlabored. NAD. Patient denies CP or SOB. Safety precautions in place. Call bell within reach. Bed in lowest position and locked.

## 2023-01-28 NOTE — Progress Notes (Signed)
VASCULAR LAB    Carotid duplex has been performed.  See CV proc for preliminary results.   Finlee Concepcion, RVT 01/28/2023, 12:14 PM

## 2023-01-28 NOTE — Progress Notes (Signed)
STROKE TEAM PROGRESS NOTE   SUBJECTIVE (INTERVAL HISTORY) Her speech therapist and pharmacist are at the bedside.  Overall her condition is rapidly improving. Pt reclining in bed, doing swallow test. She still has left HH, lower quadrant worse than upper quadrant, but right gaze much improved and left UE much stronger now. INR this am 2.2   OBJECTIVE Temp:  [97 F (36.1 C)-98.9 F (37.2 C)] 98.8 F (37.1 C) (05/30 0717) Pulse Rate:  [68-91] 68 (05/30 1100) Cardiac Rhythm: Normal sinus rhythm (05/30 0800) Resp:  [13-22] 17 (05/30 1100) BP: (111-148)/(60-107) 139/91 (05/30 1100) SpO2:  [2 %-100 %] 98 % (05/30 1100) Arterial Line BP: (112-160)/(47-72) 151/60 (05/30 1100) Weight:  [51.1 kg] 51.1 kg (05/30 0500)  Recent Labs  Lab 01/27/23 1540 01/27/23 1909 01/27/23 2344 01/28/23 0307 01/28/23 0714  GLUCAP 146* 126* 122* 106* 82   Recent Labs  Lab 01/27/23 1126 01/27/23 1128 01/28/23 0250  NA 136 139 134*  K 3.0* 2.9* 3.5  CL 101 99 101  CO2 25  --  23  GLUCOSE 127* 121* 113*  BUN 9 10 8   CREATININE 0.89 0.80 0.75  CALCIUM 9.0  --  8.4*   Recent Labs  Lab 01/27/23 1126  AST 24  ALT 16  ALKPHOS 70  BILITOT 0.4  PROT 7.4  ALBUMIN 3.8   Recent Labs  Lab 01/27/23 1126 01/27/23 1128 01/28/23 0250  WBC 9.5  --  6.5  NEUTROABS 1.8  --  4.4  HGB 13.1 13.9 11.9*  HCT 39.3 41.0 35.0*  MCV 87.9  --  86.2  PLT 243  --  201   No results for input(s): "CKTOTAL", "CKMB", "CKMBINDEX", "TROPONINI" in the last 168 hours. Recent Labs    01/27/23 1126 01/28/23 0250  LABPROT 26.5* 24.9*  INR 2.4* 2.2*   No results for input(s): "COLORURINE", "LABSPEC", "PHURINE", "GLUCOSEU", "HGBUR", "BILIRUBINUR", "KETONESUR", "PROTEINUR", "UROBILINOGEN", "NITRITE", "LEUKOCYTESUR" in the last 72 hours.  Invalid input(s): "APPERANCEUR"     Component Value Date/Time   CHOL 115 01/28/2023 0250   CHOL 145 10/17/2015 1017   TRIG 57 01/28/2023 0250   HDL 51 01/28/2023 0250   HDL 43  10/17/2015 1017   CHOLHDL 2.3 01/28/2023 0250   VLDL 11 01/28/2023 0250   LDLCALC 53 01/28/2023 0250   LDLCALC 55 10/06/2022 1038   Lab Results  Component Value Date   HGBA1C 6.3 (H) 01/04/2023   No results found for: "LABOPIA", "COCAINSCRNUR", "LABBENZ", "AMPHETMU", "THCU", "LABBARB"  Recent Labs  Lab 01/27/23 1126  ETH <10    I have personally reviewed the radiological images below and agree with the radiology interpretations.  MR BRAIN WO CONTRAST  Result Date: 01/27/2023 CLINICAL DATA:  Stroke, follow-up EXAM: MRI HEAD WITHOUT CONTRAST TECHNIQUE: Multiplanar, multiecho pulse sequences of the brain and surrounding structures were obtained without intravenous contrast. COMPARISON:  No prior MRI available, correlation is made with CT head 01/27/2023 FINDINGS: Brain: Scattered foci of restricted diffusion with ADC correlate throughout the right cerebral hemisphere, with more focal and contiguous areas of restricted diffusion in the right frontal and parietal cortex (series 5, image 89), watershed territory (series 5, image 85), right caudate and lentiform nucleus (series 5, image 78), and right occipital lobe (series 5, image 68-70). No acute hemorrhage, mass, mass effect, or midline shift. No hydrocephalus or extra-axial collection. No hemosiderin deposition to suggest remote hemorrhage. Scattered and confluent T2 hyperintense signal in the periventricular white matter and pons, likely the sequela of moderate to severe chronic  small vessel ischemic disease. Normal cerebral volume for age. Vascular: Normal arterial flow voids. Skull and upper cervical spine: Normal marrow signal. Sinuses/Orbits: Mucosal thickening in the left ethmoid air cells. No acute finding in the orbits. Other: The mastoids are well aerated. IMPRESSION: Scattered acute infarcts throughout the right cerebral hemisphere, with more focal areas of infarction in the right frontal and parietal cortex, watershed territory, right  caudate and lentiform nucleus, and right occipital lobe. These results will be called to the ordering clinician or representative by the Radiologist Assistant, and communication documented in the PACS or [employer]. Electronically Signed   By: Wiliam Ke M.D.   On: 01/27/2023 23:45   CT HEAD CODE STROKE WO CONTRAST  Result Date: 01/27/2023 CLINICAL DATA:  Code stroke.  Left-sided weakness, right-sided gaze. EXAM: CT ANGIOGRAPHY HEAD AND NECK TECHNIQUE: Multidetector CT imaging of the head and neck was performed using the standard protocol during bolus administration of intravenous contrast. Multiplanar CT image reconstructions and MIPs were obtained to evaluate the vascular anatomy. Carotid stenosis measurements (when applicable) are obtained utilizing NASCET criteria, using the distal internal carotid diameter as the denominator. RADIATION DOSE REDUCTION: This exam was performed according to the departmental dose-optimization program which includes automated exposure control, adjustment of the mA and/or kV according to patient size and/or use of iterative reconstruction technique. CONTRAST:  75 cc Omnipaque 350 COMPARISON:  CT head 09/29/2021 FINDINGS: CT HEAD FINDINGS Brain: There is no evidence of acute intracranial hemorrhage, extra-axial fluid collection, or acute territorial infarct. Parenchymal volume is normal. The ventricles are normal in size. Gray-white differentiation is preserved. There is patchy hypodensity in the supratentorial white matter likely reflecting sequela of advanced chronic small-vessel ischemic change. Pituitary and suprasellar region are normal. There is no mass lesion there is no mass effect or midline shift. Vascular: No hyperdense vessel is seen. Skull: Normal. Negative for fracture or focal lesion. Sinuses/Orbits: The paranasal sinuses are clear. The globes and orbits are unremarkable. Other: None. ASPECTS [facility] Stroke Program Early CT Score) - Ganglionic level  infarction (caudate, lentiform nuclei, internal capsule, insula, M1-M3 cortex): 7 - Supraganglionic infarction (M4-M6 cortex): 3 Total score (0-10 with 10 being normal): 10 CTA NECK FINDINGS Aortic arch: There is mild calcified plaque in the imaged aortic arch. The origins of the major branch vessels are patent. The subclavian arteries are patent to the level imaged. Right carotid system: The right common carotid artery is patent. There is mixed plaque of the bifurcation with noncalcified plaque projecting into the lumen resulting in hemodynamically significant stenosis just after the bifurcation, estimated at 60%. Distally, there is additional mural adherent clot in the lumen of the ICA resulting in high-grade stenosis, estimated at 80%. There is no raised dissection flap. The distal internal carotid artery is patent. The external carotid artery is patent. There is no evidence of aneurysm/pseudoaneurysm. Left carotid system: The left common carotid artery is patent. There is calcified plaque at the bifurcation without hemodynamically significant stenosis or occlusion. The internal and external carotid arteries are patent. There is no evidence of dissection or aneurysm/pseudoaneurysm. Vertebral arteries: The vertebral arteries are patent, without hemodynamically significant stenosis or occlusion. There is no evidence of dissection or aneurysm/pseudoaneurysm. Skeleton: There is no acute osseous abnormality or suspicious osseous lesion. There is no visible canal hematoma. Other neck: There is extensive dental disease. The soft tissues of the neck are unremarkable. Upper chest: There is emphysema in the lung apices. Review of the MIP images confirms the above findings CTA  HEAD FINDINGS Anterior circulation: There is calcified plaque in the intracranial ICAs without significant stenosis or occlusion. The right M1 segment is patent. The branch vessels are patent, without proximal stenosis or occlusion. The left MCA is  patent, without proximal stenosis or occlusion. The bilateral ACAs are patent, without proximal stenosis or occlusion. There is no aneurysm or AVM. Posterior circulation: The bilateral V4 segments are patent. The basilar artery is patent. The major cerebellar arteries appear patent. The bilateral PCAs are patent, without proximal stenosis or occlusion. There is a fetal origin of the right PCA. A left posterior communicating artery is also identified. There is no aneurysm or AVM. Venous sinuses: Patent. Anatomic variants: As above. Review of the MIP images confirms the above findings IMPRESSION: 1. No acute intracranial pathology.  No evidence of evolved infarct. 2. Mural adherent clot in the right internal carotid artery after the bifurcation resulting in high-grade stenosis, estimated at 80%. 3. More proximally in the right internal carotid artery just after the bifurcation, mixed plaque results in proximally 60% stenosis. 4. Mild plaque at the left carotid bifurcation without significant stenosis. Patent vertebral arteries in the neck. 5. Patent intracranial vasculature. 6. Emphysema. Findings of the noncontrast head CT were communicated to Dr Roda Shutters at 11:32 am. The the CTA results were discussed at 11:43 am. Electronically Signed   By: Lesia Hausen M.D.   On: 01/27/2023 11:57   CT ANGIO HEAD NECK W WO CM (CODE STROKE)  Result Date: 01/27/2023 CLINICAL DATA:  Code stroke.  Left-sided weakness, right-sided gaze. EXAM: CT ANGIOGRAPHY HEAD AND NECK TECHNIQUE: Multidetector CT imaging of the head and neck was performed using the standard protocol during bolus administration of intravenous contrast. Multiplanar CT image reconstructions and MIPs were obtained to evaluate the vascular anatomy. Carotid stenosis measurements (when applicable) are obtained utilizing NASCET criteria, using the distal internal carotid diameter as the denominator. RADIATION DOSE REDUCTION: This exam was performed according to the departmental  dose-optimization program which includes automated exposure control, adjustment of the mA and/or kV according to patient size and/or use of iterative reconstruction technique. CONTRAST:  75 cc Omnipaque 350 COMPARISON:  CT head 09/29/2021 FINDINGS: CT HEAD FINDINGS Brain: There is no evidence of acute intracranial hemorrhage, extra-axial fluid collection, or acute territorial infarct. Parenchymal volume is normal. The ventricles are normal in size. Gray-white differentiation is preserved. There is patchy hypodensity in the supratentorial white matter likely reflecting sequela of advanced chronic small-vessel ischemic change. Pituitary and suprasellar region are normal. There is no mass lesion there is no mass effect or midline shift. Vascular: No hyperdense vessel is seen. Skull: Normal. Negative for fracture or focal lesion. Sinuses/Orbits: The paranasal sinuses are clear. The globes and orbits are unremarkable. Other: None. ASPECTS [facility] Stroke Program Early CT Score) - Ganglionic level infarction (caudate, lentiform nuclei, internal capsule, insula, M1-M3 cortex): 7 - Supraganglionic infarction (M4-M6 cortex): 3 Total score (0-10 with 10 being normal): 10 CTA NECK FINDINGS Aortic arch: There is mild calcified plaque in the imaged aortic arch. The origins of the major branch vessels are patent. The subclavian arteries are patent to the level imaged. Right carotid system: The right common carotid artery is patent. There is mixed plaque of the bifurcation with noncalcified plaque projecting into the lumen resulting in hemodynamically significant stenosis just after the bifurcation, estimated at 60%. Distally, there is additional mural adherent clot in the lumen of the ICA resulting in high-grade stenosis, estimated at 80%. There is no raised dissection flap. The distal  internal carotid artery is patent. The external carotid artery is patent. There is no evidence of aneurysm/pseudoaneurysm. Left carotid system:  The left common carotid artery is patent. There is calcified plaque at the bifurcation without hemodynamically significant stenosis or occlusion. The internal and external carotid arteries are patent. There is no evidence of dissection or aneurysm/pseudoaneurysm. Vertebral arteries: The vertebral arteries are patent, without hemodynamically significant stenosis or occlusion. There is no evidence of dissection or aneurysm/pseudoaneurysm. Skeleton: There is no acute osseous abnormality or suspicious osseous lesion. There is no visible canal hematoma. Other neck: There is extensive dental disease. The soft tissues of the neck are unremarkable. Upper chest: There is emphysema in the lung apices. Review of the MIP images confirms the above findings CTA HEAD FINDINGS Anterior circulation: There is calcified plaque in the intracranial ICAs without significant stenosis or occlusion. The right M1 segment is patent. The branch vessels are patent, without proximal stenosis or occlusion. The left MCA is patent, without proximal stenosis or occlusion. The bilateral ACAs are patent, without proximal stenosis or occlusion. There is no aneurysm or AVM. Posterior circulation: The bilateral V4 segments are patent. The basilar artery is patent. The major cerebellar arteries appear patent. The bilateral PCAs are patent, without proximal stenosis or occlusion. There is a fetal origin of the right PCA. A left posterior communicating artery is also identified. There is no aneurysm or AVM. Venous sinuses: Patent. Anatomic variants: As above. Review of the MIP images confirms the above findings IMPRESSION: 1. No acute intracranial pathology.  No evidence of evolved infarct. 2. Mural adherent clot in the right internal carotid artery after the bifurcation resulting in high-grade stenosis, estimated at 80%. 3. More proximally in the right internal carotid artery just after the bifurcation, mixed plaque results in proximally 60% stenosis. 4.  Mild plaque at the left carotid bifurcation without significant stenosis. Patent vertebral arteries in the neck. 5. Patent intracranial vasculature. 6. Emphysema. Findings of the noncontrast head CT were communicated to Dr Roda Shutters at 11:32 am. The the CTA results were discussed at 11:43 am. Electronically Signed   By: Lesia Hausen M.D.   On: 01/27/2023 11:57     PHYSICAL EXAM  Temp:  [97 F (36.1 C)-98.9 F (37.2 C)] 98.8 F (37.1 C) (05/30 0717) Pulse Rate:  [68-91] 68 (05/30 1100) Resp:  [13-22] 17 (05/30 1100) BP: (111-148)/(60-107) 139/91 (05/30 1100) SpO2:  [2 %-100 %] 98 % (05/30 1100) Arterial Line BP: (112-160)/(47-72) 151/60 (05/30 1100) Weight:  [51.1 kg] 51.1 kg (05/30 0500)  General - Well nourished, well developed, in no apparent distress.  Ophthalmologic - fundi not visualized due to noncooperation.  Cardiovascular - Regular rhythm and rate.  Neuro - awake, alert, eyes open, orientated to age, place, time. No aphasia, fluent language, following all simple commands. Able to name and repeat, mild dysarthria. Right gaze preference but able to cross midline, left lower quadrantanopia, however, upper quadrant intermittent counting fingers. Left mild facial droop. Tongue midline. LUE 4/5 proximal and distally, BLEs and RUE 5/5. Sensation symmetrical bilaterally however with LUE simultagnosia, b/l FTN intact but slow on the left, gait not tested.    ASSESSMENT/PLAN Ms. GRACEMARY LETENDRE is a 72 y.o. female with history of PAD, CAD, smoker, HTN, HLD, on chronic coumadin presented for left sided weakness, right forced gaze and left neglect. NIHSS = 14. CT no acute finding. INR 2.4. CTA showed right carotid mural thrombus with high grade stenosis. Not TNK candidate due to high INR. Given severe  neuro deficit and right ICA flow limiting on imaging, discussed with Dr. Corliss Skains, activated code IR.    Stroke:  right MCA and MCA/ACA scattered infarct embolic from intramural right ICA thrombus,  secondary to ? Coagulation disorder ? Even on coumadin with therapeutic INR CT no acute finding CTA showed right carotid mural thrombus with high grade stenosis 80%. More proximally in the right internal carotid artery just after the bifurcation, mixed plaque results in proximally 60% stenosis. MRI  Scattered acute infarcts throughout the right cerebral hemisphere, with more focal areas of infarction in the right frontal and parietal cortex, watershed territory, right caudate and lentiform nucleus, and right occipital lobe. Carotid Doppler 1 to 39% bilaterally 2D Echo EF 55 to 60% LDL 53 HgbA1c pending Coumadin for VTE prophylaxis warfarin daily prior to admission, now on aspirin 325 mg daily and warfarin daily, will need to increase the goal INR to 2.5-3.5. once INR at goal, ASA can be discontinued. Patient counseled to be compliant with her antithrombotic medications Ongoing aggressive stroke risk factor management Therapy recommendations: CIR Disposition:  pending  ? Coagulation disorder On Coumadin at home PTA INR 2.4 on admission This morning INR 2.2 Given developed carotid intramural thrombus in the setting of therapeutic INR, will increase INR goal to 2.5-3.5.  Hypertension Stable Off Cleviprex BP goal less than 180/105 Long term BP goal normotensive  Hyperlipidemia Home meds: Lipitor 10 LDL 53, goal < 70 Now on Lipitor 10 No high intensity statin given LDL within goal Continue statin at discharge  Tobacco abuse Current smoker Smoking cessation counseling provided Nicotine patch provided Pt is willing to quit  Other Stroke Risk Factors Advanced age PAD CAD  Other Active Problems Mild hyponatremia, sodium 134  Hospital day # 1  This patient is critically ill due to carotid thrombus status post thrombectomy, chronic Coumadin use and at significant risk of neurological worsening, death form recurrent stroke, hemorrhagic transformation. This patient's care  requires constant monitoring of vital signs, hemodynamics, respiratory and cardiac monitoring, review of multiple databases, neurological assessment, discussion with family, other specialists and medical decision making of high complexity. I spent 35 minutes of neurocritical care time in the care of this patient.  Marvel Plan, MD PhD Stroke Neurology 01/28/2023 11:45 AM    To contact Stroke Continuity provider, please refer to [email or web address]. After hours, contact General Neurology

## 2023-01-28 NOTE — Evaluation (Signed)
Speech Language Pathology Evaluation Patient Details Name: Mary [name] MRN: [id number] DOB: May 13, 1951 Today's Date: 01/28/2023 Time: 6578-4696 SLP Time Calculation (min) (ACUTE ONLY): 32 min  Problem List:  Patient Active Problem List   Diagnosis Date Noted   Stroke (HCC) 01/27/2023   Internal carotid artery occlusion, right 01/27/2023   Coagulation disorder (HCC) 01/01/2022   Anticoagulated on Coumadin 02/05/2020   History of gout 06/23/2019   Coronary artery disease 02/16/2019   Benign neoplasm of cecum    Benign neoplasm of transverse colon    Polyp of sigmoid colon    External hemorrhoids    Hip arthritis 10/27/2017   Encounter for monitoring Coumadin therapy 02/27/2015   PAD (peripheral artery disease) (HCC)    Essential hypertension    Mixed hyperlipidemia    Tobacco abuse    Hyperuricemia    Past Medical History:  Past Medical History:  Diagnosis Date   Arthritis    Hyperlipidemia    Hypertension    Hyperuricemia    Osteoporosis    PAD (peripheral artery disease) (HCC) 02/2010   angiplasty, popliteal artery   Tobacco abuse    Past Surgical History:  Past Surgical History:  Procedure Laterality Date   ANGIOPLASTY  July 2011   PAD- popliteal artery   COLONOSCOPY WITH PROPOFOL N/A 01/13/2018   Procedure: COLONOSCOPY WITH PROPOFOL;  Surgeon: Toney Reil, MD;  Location: [facility];  Service: Gastroenterology;  Laterality: N/A;   COLONOSCOPY WITH PROPOFOL N/A 01/14/2018   Procedure: COLONOSCOPY WITH PROPOFOL;  Surgeon: Pasty Spillers, MD;  Location: [facility];  Service: Endoscopy;  Laterality: N/A;   COLONOSCOPY WITH PROPOFOL N/A 07/06/2018   Procedure: COLONOSCOPY WITH PROPOFOL;  Surgeon: Pasty Spillers, MD;  Location: [facility];  Service: Endoscopy;  Laterality: N/A;   CORONARY ANGIOPLASTY     JOINT REPLACEMENT     Leg Bypass Surgery Right 08/2010   OVARIAN CYST SURGERY  1980s   POPLITEAL ARTERY STENT Right 05/2010   POPLITEAL  ARTERY STENT Right 07/2010   RADIOLOGY WITH ANESTHESIA N/A 01/27/2023   Procedure: IR WITH ANESTHESIA;  Surgeon: Radiologist, Medication, MD;  Location: [facility] OR;  Service: Radiology;  Laterality: N/A;   TOTAL HIP ARTHROPLASTY Left 10/27/2017   Procedure: TOTAL HIP ARTHROPLASTY;  Surgeon: Deeann Saint, MD;  Location: [facility] ORS;  Service: Orthopedics;  Laterality: Left;   HPI:  Mary [name] is a 72 y.o. African American female who presented to ED for code stroke.  Now s/p thrombectomy with IR. MRI 5/29: "Scattered acute infarcts throughout the right cerebral hemisphere, with more focal areas of infarction in the right frontal and parietal cortex, watershed territory, right caudate and lentiform nucleus, and right occipital lobe." Pt with PMH of PAD, CAD, smoker, HTN, HLD, coagulation disorder on coumadin   Assessment / Plan / Recommendation Clinical Impression  Pt presents with mild cognitive impairment in setting of new R sided CVA.  Pt was assessed using the COGNISTAT (see below for additional information).  Pt's most serious impairment is L neglect.  She required consistent maximal verbal, visual, and auditory cues to scan to L side of visual field. This deficits impacted her performance on comprehension task, but there are no suspected receptive language impairments. Pt exhibited moderate impairment with calculations and appeared to become distracted during this task, though she was able to attend well throughout other activities.  Pt exhibited mild memory impairment.  Pt's expressive language is largely intact, but there were some semantic and phonemic paraphasias noted ("oyster"  for "octopus" and "train" for "bus"). Pt may benefit from brief treatment of mild anomic aphasia.  Pt's receptive language is seemingly intact with deficits on comprehension task attributable to dense left neglect.  Writing and visuospatial tasks were not attempted today as pt is L handed and has LUE deficits  Pt's speech is  clear and intelligible.    Recommend ST to address the above noted deficits in house and continuing at next level of care.  COGNISTAT: All subtests are within the average range, except where otherwise specified.  Orientation:  12/12 Attention: 8/8 Comprehension: 1/6, severe impairment, but this is directly related to L neglect Repetition: 12/12 Naming: 6/8, borderline impairment Construction: not assessed Memory: 7/12, mild impairment Calculations: 1/4, moderate impairment Similarities: 7/8 Judgment: 4/6      SLP Assessment  SLP Recommendation/Assessment: Patient needs continued Speech Lanaguage Pathology Services SLP Visit Diagnosis: Cognitive communication deficit (R41.841)    Recommendations for follow up therapy are one component of a multi-disciplinary discharge planning process, led by the attending physician.  Recommendations may be updated based on patient status, additional functional criteria and insurance authorization.    Follow Up Recommendations  Acute inpatient rehab (3hours/day)    Assistance Recommended at Discharge  Frequent or constant Supervision/Assistance  Functional Status Assessment Patient has had a recent decline in their functional status and demonstrates the ability to make significant improvements in function in a reasonable and predictable amount of time.  Frequency and Duration min 2x/week  2 weeks      SLP Evaluation Cognition  Overall Cognitive Status: Impaired/Different from baseline Arousal/Alertness: Awake/alert Orientation Level: Oriented X4 Year: 2024 Month: May Day of Week: Correct Attention: Focused;Sustained Focused Attention: Appears intact Sustained Attention: Appears intact Memory: Impaired Memory Impairment: Decreased short term memory Awareness: Impaired Problem Solving: Impaired Problem Solving Impairment: Verbal basic Executive Function: Reasoning Reasoning: Appears intact       Comprehension  Auditory  Comprehension Overall Auditory Comprehension: Appears within functional limits for tasks assessed Commands: Impaired (2/2 L neglect) Visual Recognition/Discrimination Discrimination: Not tested Reading Comprehension Reading Status: Not tested    Expression Expression Primary Mode of Expression: Verbal Verbal Expression Overall Verbal Expression: Impaired Level of Generative/Spontaneous Verbalization: Sentence Repetition: No impairment Naming: Impairment Confrontation: Impaired Verbal Errors: Semantic paraphasias;Phonemic paraphasias Pragmatics: No impairment Written Expression Dominant Hand: Left Written Expression: Not tested   Oral / Motor  Oral Motor/Sensory Function Overall Oral Motor/Sensory Function: Moderate impairment Facial ROM: Reduced left Facial Symmetry: Abnormal symmetry left Lingual ROM: Within Functional Limits Lingual Symmetry: Abnormal symmetry left Lingual Strength: Reduced Velum: Within Functional Limits Mandible: Within Functional Limits Motor Speech Overall Motor Speech: Appears within functional limits for tasks assessed Respiration: Within functional limits Phonation: Normal Resonance: Within functional limits Intelligibility: Intelligible Motor Planning: Witnin functional limits Motor Speech Errors: Not applicable            Kerrie Pleasure, MA, CCC-SLP Acute Rehabilitation Services Office: [phone] 01/28/2023, 1:12 PM

## 2023-01-28 NOTE — Evaluation (Signed)
Physical Therapy Evaluation Patient Details Name: Mary [name] MRN: [id number] DOB: 1951-08-25 Today's Date: 01/28/2023  History of Present Illness  72 [facility] with L weakness, R gaze. CTA showed right carotid mural thrombus with high grade stenosis, s/p s/p complete revascularization of R ICA on 5/29. MRI brain shows Scattered acute infarcts throughout the right cerebral hemisphere,  with more focal areas of infarction in the right frontal and parietal cortex, watershed territory, right caudate and lentiform nucleus, and right occipital lobe. PMH includes  PAD, HTN, HLD, tobacco abuse, CAD, osteoporosis.  Clinical Impression   Pt presents with L weakness, L inattention, impaired balance, L-sided incoordination, and decreased activity tolerance. Pt to benefit from acute PT to address deficits. Pt requiring up to mod +2 assist for short-distance gait in room, pt limited in progression by fatigue and echo arriving to room. PT to progress mobility as tolerated, and will continue to follow acutely.         Recommendations for follow up therapy are one component of a multi-disciplinary discharge planning process, led by the attending physician.  Recommendations may be updated based on patient status, additional functional criteria and insurance authorization.  Follow Up Recommendations       Assistance Recommended at Discharge Frequent or constant Supervision/Assistance  Patient can return home with the following  A lot of help with walking and/or transfers;A lot of help with bathing/dressing/bathroom    Equipment Recommendations None recommended by PT  Recommendations for Other Services       Functional Status Assessment Patient has had a recent decline in their functional status and demonstrates the ability to make significant improvements in function in a reasonable and predictable amount of time.     Precautions / Restrictions Precautions Precautions:  Fall Precaution Comments: L inattention, responds well to cuing Restrictions Weight Bearing Restrictions: No      Mobility  Bed Mobility Overal bed mobility: Needs Assistance Bed Mobility: Supine to Sit, Sit to Supine     Supine to sit: Min assist, +2 for physical assistance, HOB elevated Sit to supine: Min assist, HOB elevated   General bed mobility comments: assist for trunk and LE management, pt using RUE to pull self up    Transfers Overall transfer level: Needs assistance Equipment used: 2 person hand held assist Transfers: Sit to/from Stand, Bed to chair/wheelchair/BSC Sit to Stand: Mod assist, +2 physical assistance Stand pivot transfers: Mod assist, +2 physical assistance         General transfer comment: mod +2 for power up, rise, steady, and pivot to/from [facility]. LLE incoordination with stepping to/from [facility]    Ambulation/Gait Ambulation/Gait assistance: Min assist, +2 physical assistance Gait Distance (Feet): 5 Feet Assistive device: 2 person hand held assist Gait Pattern/deviations: Step-through pattern, Decreased stride length, Trunk flexed Gait velocity: decr     General Gait Details: assist to steady, guide trajectory, inconsistent placement of LLE  Stairs            Wheelchair Mobility    Modified Rankin (Stroke Patients Only)       Balance Overall balance assessment: Needs assistance, History of Falls Sitting-balance support: No upper extremity supported, Feet supported Sitting balance-Leahy Scale: Fair     Standing balance support: No upper extremity supported, Reliant on assistive device for balance Standing balance-Leahy Scale: Poor Standing balance comment: reliant on external support  Pertinent Vitals/Pain Pain Assessment Pain Assessment: No/denies pain    Home Living Family/patient expects to be discharged to:: Private residence Living Arrangements: Spouse/significant other Available  Help at Discharge: Family Type of Home: House Home Access: Stairs to enter Entrance Stairs-Rails: Lawyer of Steps: 3   Home Layout: One level Home Equipment: Agricultural consultant (2 wheels)      Prior Function Prior Level of Function : Independent/Modified Independent                     Hand Dominance   Dominant Hand: Left    Extremity/Trunk Assessment   Upper Extremity Assessment Upper Extremity Assessment: Defer to OT evaluation    Lower Extremity Assessment Lower Extremity Assessment: LLE deficits/detail LLE Deficits / Details: at least 3/5 hip flexion, knee flexion/extension, DF/PF, hip abd/add. Formal MMT limited, echo arrived to room LLE Coordination: decreased gross motor;decreased fine motor    Cervical / Trunk Assessment Cervical / Trunk Assessment: Normal  Communication   Communication: No difficulties  Cognition Arousal/Alertness: Awake/alert Behavior During Therapy: WFL for tasks assessed/performed Overall Cognitive Status: Impaired/Different from baseline Area of Impairment: Following commands, Safety/judgement                       Following Commands: Follows one step commands with increased time Safety/Judgement: Decreased awareness of deficits, Decreased awareness of safety     General Comments: L inattention, requires cues for attend to L. follows one-step commands with increased time and cuing        General Comments General comments (skin integrity, edema, etc.): Pt with L groin incision s/p IR - assessed pre-, mid-, and post-mobility, no hematoma or bruising noted at any point. KI donned at start of session, removed for session and left off at end of session due to echo arrived to room, RN notified needs to be re-donned    Exercises     Assessment/Plan    PT Assessment Patient needs continued PT services  PT Problem List Decreased strength;Decreased mobility;Decreased activity tolerance;Decreased  balance;Decreased knowledge of use of DME;Pain;Decreased safety awareness;Decreased coordination;Decreased cognition       PT Treatment Interventions DME instruction;Therapeutic activities;Gait training;Therapeutic exercise;Patient/family education;Balance training;Stair training;Functional mobility training;Neuromuscular re-education    PT Goals (Current goals can be found in the Care Plan section)  Acute Rehab PT Goals Patient Stated Goal: back to independence PT Goal Formulation: With patient Time For Goal Achievement: 02/11/23 Potential to Achieve Goals: Good    Frequency Min 4X/week     Co-evaluation               AM-PAC PT "6 Clicks" Mobility  Outcome Measure Help needed turning from your back to your side while in a flat bed without using bedrails?: A Little Help needed moving from lying on your back to sitting on the side of a flat bed without using bedrails?: A Lot Help needed moving to and from a bed to a chair (including a wheelchair)?: A Lot Help needed standing up from a chair using your arms (e.g., wheelchair or bedside chair)?: A Lot Help needed to walk in hospital room?: A Lot Help needed climbing 3-5 steps with a railing? : Total 6 Click Score: 12    End of Session Equipment Utilized During Treatment: Oxygen;Left knee immobilizer (2LO2, L KI removed during session and left at bedside for RN to re-don after echo) Activity Tolerance: Patient tolerated treatment well;Patient limited by fatigue Patient left: in bed;with call bell/phone within  reach;with family/visitor present;with nursing/sitter in room;Other (comment) (echo at bedside) Nurse Communication: Mobility status PT Visit Diagnosis: Other abnormalities of gait and mobility (R26.89);History of falling (Z91.81)    Time: 1610-9604 PT Time Calculation (min) (ACUTE ONLY): 25 min   Charges:   PT Evaluation $PT Eval Low Complexity: 1 Low         Jahmya Onofrio S, PT DPT Acute Rehabilitation  Services Secure Chat Preferred  Office [phone]    Truddie Coco 01/28/2023, 3:46 PM

## 2023-01-28 NOTE — Plan of Care (Signed)
  Problem: Ischemic Stroke/TIA Tissue Perfusion: Goal: Complications of ischemic stroke/TIA will be minimized Outcome: Progressing   

## 2023-01-28 NOTE — Evaluation (Signed)
Occupational Therapy Evaluation Patient Details Name: Mary [name] MRN: [id number] DOB: 1950-09-04 Today's Date: 01/28/2023   History of Present Illness 72 yo female presents to [facility] on 5/29 with L weakness, R gaze. CTA showed right carotid mural thrombus with high grade stenosis, s/p s/p complete revascularization of R ICA on 5/29. MRI brain shows Scattered acute infarcts throughout the right cerebral hemisphere,  with more focal areas of infarction in the right frontal and parietal cortex, watershed territory, right caudate and lentiform nucleus, and right occipital lobe. PMH includes  PAD, HTN, HLD, tobacco abuse, CAD, osteoporosis.   Clinical Impression   PTA, pt lived with husband and was independent. Upon eval, pt presents with L decr attention, UE/LE coordination, L side environment and body awareness, safety, and balance. Pt requiring min A for UB ADL and Mod A for LB ADL. Pt able to perform ocular ROM in all quads with commands, but overall decr awareness and attention to L side. Acute OT warranted due to decr coordination and decr awareness of L. Due to significant change in status, pt motivation, and support, pt to benefit from multidisciplinary inpatient rehabilitation >3 hours/day.      Recommendations for follow up therapy are one component of a multi-disciplinary discharge planning process, led by the attending physician.  Recommendations may be updated based on patient status, additional functional criteria and insurance authorization.   Assistance Recommended at Discharge Frequent or constant Supervision/Assistance  Patient can return home with the following A lot of help with walking and/or transfers;A lot of help with bathing/dressing/bathroom;Assistance with cooking/housework;Direct supervision/assist for medications management;Direct supervision/assist for financial management;Assist for transportation;Help with stairs or ramp for entrance    Functional Status Assessment   Patient has had a recent decline in their functional status and demonstrates the ability to make significant improvements in function in a reasonable and predictable amount of time.  Equipment Recommendations  Other (comment) (defer next venue)    Recommendations for Other Services Rehab consult     Precautions / Restrictions Precautions Precautions: Fall Precaution Comments: L inattention, responds well to cuing Restrictions Weight Bearing Restrictions: No      Mobility Bed Mobility Overal bed mobility: Needs Assistance Bed Mobility: Supine to Sit, Sit to Supine     Supine to sit: Min assist, +2 for physical assistance, HOB elevated Sit to supine: Min assist, HOB elevated   General bed mobility comments: assist for trunk and LE management, pt using RUE to pull self up    Transfers Overall transfer level: Needs assistance Equipment used: 2 person hand held assist Transfers: Sit to/from Stand, Bed to chair/wheelchair/BSC Sit to Stand: Mod assist, +2 physical assistance Stand pivot transfers: Mod assist, +2 physical assistance         General transfer comment: mod +2 for power up, rise, steady, and pivot to/from [facility]. LLE incoordination with stepping to/from [facility] [address]      Balance Overall balance assessment: Needs assistance, History of Falls Sitting-balance support: No upper extremity supported, Feet supported Sitting balance-Leahy Scale: Fair     Standing balance support: No upper extremity supported, Reliant on assistive device for balance Standing balance-Leahy Scale: Poor Standing balance comment: reliant on external support                           ADL either performed or assessed with clinical judgement   ADL Overall ADL's : Needs assistance/impaired Eating/Feeding: Set up;Sitting   Grooming: Minimal assistance   Upper Body Bathing:  Minimal assistance   Lower Body Bathing: Moderate assistance   Upper Body Dressing : Minimal assistance   Lower  Body Dressing: Moderate assistance   Toilet Transfer: Moderate assistance;+2 for physical assistance;+2 for safety/equipment;Stand-pivot;Ambulation Toilet Transfer Details (indicate cue type and reason): 2 person HHA; LLE incoordination stepping to [facility] Toileting- Clothing Manipulation and Hygiene: Sit to/from stand;Moderate assistance Toileting - Clothing Manipulation Details (indicate cue type and reason): for STS; pt able to lean forward in standing and wipe self.     Functional mobility during ADLs: Minimal assistance;Moderate assistance;+2 for physical assistance General ADL Comments: Limited by L inattention, decr coordiantion. Needs further testing of proprioception; suspect decr in LE and possubly UE     Vision Baseline Vision/History: 1 Wears glasses Ability to See in Adequate Light: 0 Adequate Patient Visual Report: No change from baseline Vision Assessment?: Vision impaired- to be further tested in functional context Additional Comments: Pt denies changes but obviously decr attention to L and only performing occular ROM to L when cued to do so.     Perception     Praxis      Pertinent Vitals/Pain Pain Assessment Pain Assessment: No/denies pain     Hand Dominance Left   Extremity/Trunk Assessment Upper Extremity Assessment Upper Extremity Assessment: LUE deficits/detail LUE Deficits / Details: good grip strength >R, but gross motor 4-/5; decr coordination, attention to L side LUE Coordination: decreased fine motor;decreased gross motor   Lower Extremity Assessment Lower Extremity Assessment: Defer to PT evaluation LLE Deficits / Details: at least 3/5 hip flexion, knee flexion/extension, DF/PF, hip abd/add. Formal MMT limited, echo arrived to room LLE Coordination: decreased gross motor;decreased fine motor   Cervical / Trunk Assessment Cervical / Trunk Assessment: Normal   Communication Communication Communication: No difficulties   Cognition Arousal/Alertness:  Awake/alert Behavior During Therapy: WFL for tasks assessed/performed Overall Cognitive Status: Impaired/Different from baseline Area of Impairment: Following commands, Safety/judgement                       Following Commands: Follows one step commands with increased time Safety/Judgement: Decreased awareness of deficits, Decreased awareness of safety     General Comments: L inattention, requires cues for attend to L. follows one-step commands with increased time and cuing     General Comments  Pt with L groin incision s/p IR - assessed pre-, mid-, and post-mobility, no hematoma or bruising noted at any point. KI donned at start of session, removed for session and left off at end of session due to echo arrived to room, RN notified needs to be re-donned    Exercises     Shoulder Instructions      Home Living Family/patient expects to be discharged to:: Private residence Living Arrangements: Spouse/significant other Available Help at Discharge: Family Type of Home: House Home Access: Stairs to enter Secretary/administrator of Steps: 3 Entrance Stairs-Rails: Left;Right Home Layout: One level     Bathroom Shower/Tub: Chief Strategy Officer: Standard Bathroom Accessibility: Yes   Home Equipment: Agricultural consultant (2 wheels)      Lives With: Spouse    Prior Functioning/Environment Prior Level of Function : Independent/Modified Independent;Driving             Mobility Comments: No AD ADLs Comments: Indep in ADL and IADL        OT Problem List: Decreased strength;Impaired balance (sitting and/or standing);Decreased coordination;Impaired vision/perception      OT Treatment/Interventions: Self-care/ADL training;Therapeutic exercise;DME and/or AE instruction;Therapeutic activities;Patient/family education;Balance  training;Visual/perceptual remediation/compensation;Cognitive remediation/compensation    OT Goals(Current goals can be found in the care  plan section) Acute Rehab OT Goals Patient Stated Goal: get better OT Goal Formulation: With patient Time For Goal Achievement: 02/11/23 Potential to Achieve Goals: Good  OT Frequency: Min 2X/week    Co-evaluation              AM-PAC OT "6 Clicks" Daily Activity     Outcome Measure Help from another person eating meals?: A Little Help from another person taking care of personal grooming?: A Little Help from another person toileting, which includes using toliet, bedpan, or urinal?: A Lot Help from another person bathing (including washing, rinsing, drying)?: A Little Help from another person to put on and taking off regular upper body clothing?: A Little Help from another person to put on and taking off regular lower body clothing?: A Lot 6 Click Score: 16   End of Session Nurse Communication: Mobility status  Activity Tolerance: Patient tolerated treatment well Patient left: in bed;with call bell/phone within reach (echo, RN, family in room)  OT Visit Diagnosis: Unsteadiness on feet (R26.81);Muscle weakness (generalized) (M62.81);Other abnormalities of gait and mobility (R26.89);Hemiplegia and hemiparesis;Low vision, both eyes (H54.2) Hemiplegia - Right/Left: Left Hemiplegia - dominant/non-dominant: Dominant Hemiplegia - caused by: Cerebral infarction                Time: 1610-9604 OT Time Calculation (min): 26 min Charges:  OT General Charges $OT Visit: 1 Visit OT Evaluation $OT Eval Moderate Complexity: 1 Mod  Mary [name], OTR/L [facility] Acute Rehabilitation Office: [phone]   Myrla Halsted 01/28/2023, 4:18 PM

## 2023-01-28 NOTE — Evaluation (Addendum)
Clinical/Bedside Swallow Evaluation Patient Details  Name: Mary [name] MRN: [id number] Date of Birth: 1951-07-30  Today's Date: 01/28/2023 Time: SLP Start Time (ACUTE ONLY): 1034 SLP Stop Time (ACUTE ONLY): 1044 SLP Time Calculation (min) (ACUTE ONLY): 10 min  Past Medical History:  Past Medical History:  Diagnosis Date   Arthritis    Hyperlipidemia    Hypertension    Hyperuricemia    Osteoporosis    PAD (peripheral artery disease) (HCC) 02/2010   angiplasty, popliteal artery   Tobacco abuse    Past Surgical History:  Past Surgical History:  Procedure Laterality Date   ANGIOPLASTY  July 2011   PAD- popliteal artery   COLONOSCOPY WITH PROPOFOL N/A 01/13/2018   Procedure: COLONOSCOPY WITH PROPOFOL;  Surgeon: Toney Reil, MD;  Location: [facility] ENDOSCOPY;  Service: Gastroenterology;  Laterality: N/A;   COLONOSCOPY WITH PROPOFOL N/A 01/14/2018   Procedure: COLONOSCOPY WITH PROPOFOL;  Surgeon: Pasty Spillers, MD;  Location: [facility] ENDOSCOPY;  Service: Endoscopy;  Laterality: N/A;   COLONOSCOPY WITH PROPOFOL N/A 07/06/2018   Procedure: COLONOSCOPY WITH PROPOFOL;  Surgeon: Pasty Spillers, MD;  Location: [facility] ENDOSCOPY;  Service: Endoscopy;  Laterality: N/A;   CORONARY ANGIOPLASTY     JOINT REPLACEMENT     Leg Bypass Surgery Right 08/2010   OVARIAN CYST SURGERY  1980s   POPLITEAL ARTERY STENT Right 05/2010   POPLITEAL ARTERY STENT Right 07/2010   RADIOLOGY WITH ANESTHESIA N/A 01/27/2023   Procedure: IR WITH ANESTHESIA;  Surgeon: Radiologist, Medication, MD;  Location: [facility] OR;  Service: Radiology;  Laterality: N/A;   TOTAL HIP ARTHROPLASTY Left 10/27/2017   Procedure: TOTAL HIP ARTHROPLASTY;  Surgeon: Deeann Saint, MD;  Location: [facility] ORS;  Service: Orthopedics;  Laterality: Left;   HPI:  Mary [name] is a 72 y.o. African American female who presented to ED for code stroke.  Now s/p thrombectomy with IR. MRI 5/29: "Scattered acute infarcts throughout the right cerebral  hemisphere, with more focal areas of infarction in the right frontal and parietal cortex, watershed territory, right caudate and lentiform nucleus, and right occipital lobe." Pt with PMH of PAD, CAD, smoker, HTN, HLD, coagulation disorder on coumadin    Assessment / Plan / Recommendation  Clinical Impression  Pt presents with clinical indicators of pharyngeal dysphagia. There was delayed throat clear which ocurred with thin liquid and regular solid.  Pt attributed to phlegm in throat.  Pt intermittently required double swallow.  Pt exhibited good oral clearance of puree and solid despite L sided orofacial impairments.  Given scattered nature of infarcts and some subcortical involvement on imaging and clinical presentation, recommend instrumental swallow assessment prior to initiation of PO diet.  Radiology schedule will not permit evaluation this date.  Pt in agreement with FEES for further assessment of swallow function.  Recommend pt remain NPO pending results of FEES   SLP Visit Diagnosis: Dysphagia, unspecified (R13.10)    Aspiration Risk       Diet Recommendation NPO        Other  Recommendations Oral Care Recommendations: Oral care QID    Recommendations for follow up therapy are one component of a multi-disciplinary discharge planning process, led by the attending physician.  Recommendations may be updated based on patient status, additional functional criteria and insurance authorization.  Follow up Recommendations  (TBD)      Assistance Recommended at Discharge  N/A  Functional Status Assessment  (TBD)  Frequency and Duration  (TBD)  Prognosis Prognosis for improved oropharyngeal function:  (TBD)      Swallow Study   General Date of Onset: 01/27/23 HPI: Mary [name] is a 72 y.o. African American female who presented to ED for code stroke.  Now s/p thrombectomy with IR. MRI 5/29: "Scattered acute infarcts throughout the right cerebral hemisphere, with more  focal areas of infarction in the right frontal and parietal cortex, watershed territory, right caudate and lentiform nucleus, and right occipital lobe." Pt with PMH of PAD, CAD, smoker, HTN, HLD, coagulation disorder on coumadin Type of Study: Bedside Swallow Evaluation Previous Swallow Assessment: None Diet Prior to this Study: NPO Respiratory Status: Nasal cannula History of Recent Intubation:  (for IR procedure only) Behavior/Cognition: Alert Oral Cavity Assessment: Within Functional Limits Oral Care Completed by SLP: No Oral Cavity - Dentition: Poor condition;Missing dentition Vision: Impaired for self-feeding Self-Feeding Abilities: Needs assist Patient Positioning: Upright in bed Baseline Vocal Quality: Normal Volitional Cough: Strong Volitional Swallow: Able to elicit    Oral/Motor/Sensory Function Overall Oral Motor/Sensory Function: Moderate impairment Facial ROM: Reduced left Facial Symmetry: Abnormal symmetry left Lingual ROM: Within Functional Limits Lingual Symmetry: Abnormal symmetry left Lingual Strength: Reduced Velum: Within Functional Limits Mandible: Within Functional Limits   Ice Chips Ice chips: Not tested   Thin Liquid Thin Liquid: Impaired Pharyngeal  Phase Impairments: Multiple swallows;Throat Clearing - Delayed    Nectar Thick Nectar Thick Liquid: Not tested   Honey Thick Honey Thick Liquid: Not tested   Puree Puree: Within functional limits Presentation: Spoon   Solid     Solid: Impaired Presentation:  (SLP fed) Pharyngeal Phase Impairments: Throat Clearing - Delayed      Kerrie Pleasure, MA, CCC-SLP Acute Rehabilitation Services Office: [phone] 01/28/2023,1:00 PM

## 2023-01-29 ENCOUNTER — Other Ambulatory Visit: Payer: Self-pay | Admitting: Physician Assistant

## 2023-01-29 DIAGNOSIS — G8194 Hemiplegia, unspecified affecting left nondominant side: Secondary | ICD-10-CM | POA: Diagnosis not present

## 2023-01-29 DIAGNOSIS — I63131 Cerebral infarction due to embolism of right carotid artery: Secondary | ICD-10-CM | POA: Diagnosis not present

## 2023-01-29 DIAGNOSIS — I6521 Occlusion and stenosis of right carotid artery: Secondary | ICD-10-CM

## 2023-01-29 LAB — PROTIME-INR
INR: 2.1 — ABNORMAL HIGH (ref 0.8–1.2)
Prothrombin Time: 23.9 seconds — ABNORMAL HIGH (ref 11.4–15.2)

## 2023-01-29 MED ORDER — WARFARIN SODIUM 7.5 MG PO TABS
7.5000 mg | ORAL_TABLET | Freq: Once | ORAL | Status: AC
  Administered 2023-01-29: 7.5 mg via ORAL
  Filled 2023-01-29: qty 1

## 2023-01-29 NOTE — Progress Notes (Signed)
  Inpatient Rehabilitation Admissions Coordinator   Met with patient at bedside for rehab assessment and then contcated her spouse by phone.  We discussed goals and expectations of a possible CIR admit. They prefer CIR for rehab. Family can provide expected caregiver support that is recommended of supervision  level. I will begin insurance Auth with [facility] Medicare for possible CIR admit pending approval. Please call me with any questions.   Ottie Glazier, RN, MSN Rehab Admissions Coordinator [phone]

## 2023-01-29 NOTE — Progress Notes (Signed)
Physical Therapy Treatment Patient Details Name: Mary [name] MRN: [id number] DOB: 11/23/1950 Today's Date: 01/29/2023   History of Present Illness 72 yo female presents to [facility] on 5/29 with L weakness, R gaze. CTA showed right carotid mural thrombus with high grade stenosis, s/p s/p complete revascularization of R ICA on 5/29. MRI brain shows Scattered acute infarcts throughout the right cerebral hemisphere,  with more focal areas of infarction in the right frontal and parietal cortex, watershed territory, right caudate and lentiform nucleus, and right occipital lobe. PMH includes  PAD, HTN, HLD, tobacco abuse, CAD, osteoporosis.    PT Comments    Pt received in bed, eager for OOB and participation in therapy. Pt assisted to/from [facility] prior to in room ambulation. She required min assist bed mobility, mod assist transfers, and mod assist ambulation 15' without AD. L side weakness and L inattention persists. Pt in recliner with feet elevated at end of session.    Recommendations for follow up therapy are one component of a multi-disciplinary discharge planning process, led by the attending physician.  Recommendations may be updated based on patient status, additional functional criteria and insurance authorization.  Follow Up Recommendations       Assistance Recommended at Discharge Frequent or constant Supervision/Assistance  Patient can return home with the following A lot of help with walking and/or transfers;A lot of help with bathing/dressing/bathroom   Equipment Recommendations  None recommended by PT    Recommendations for Other Services       Precautions / Restrictions Precautions Precautions: Fall;Other (comment) Precaution Comments: L inattention     Mobility  Bed Mobility Overal bed mobility: Needs Assistance Bed Mobility: Supine to Sit     Supine to sit: Min assist, HOB elevated     General bed mobility comments: +rail, increased time, assist with LLE and trunk     Transfers Overall transfer level: Needs assistance Equipment used: 1 person hand held assist Transfers: Sit to/from Stand, Bed to chair/wheelchair/BSC Sit to Stand: Mod assist Stand pivot transfers: Mod assist         General transfer comment: assist to power up and stabilize balance. Pivot transfer bed <> [facility]    Ambulation/Gait Ambulation/Gait assistance: Mod assist Gait Distance (Feet): 15 Feet Assistive device: None Gait Pattern/deviations: Step-through pattern, Decreased stride length, Decreased weight shift to left Gait velocity: decreased Gait velocity interpretation: <1.8 ft/sec, indicate of risk for recurrent falls   General Gait Details: assist to maintain balance. Inconsistent placement of LLE   Stairs             Wheelchair Mobility    Modified Rankin (Stroke Patients Only) Modified Rankin (Stroke Patients Only) Pre-Morbid Rankin Score: No symptoms Modified Rankin: Moderately severe disability     Balance Overall balance assessment: Needs assistance, History of Falls Sitting-balance support: No upper extremity supported, Feet supported Sitting balance-Leahy Scale: Fair     Standing balance support: No upper extremity supported, During functional activity Standing balance-Leahy Scale: Poor Standing balance comment: reliant on external support                            Cognition Arousal/Alertness: Awake/alert Behavior During Therapy: WFL for tasks assessed/performed Overall Cognitive Status: Impaired/Different from baseline Area of Impairment: Following commands, Safety/judgement                       Following Commands: Follows one step commands with increased time Safety/Judgement: Decreased awareness  of deficits, Decreased awareness of safety     General Comments: L inattention        Exercises      General Comments        Pertinent Vitals/Pain Pain Assessment Pain Assessment: No/denies pain    Home  Living                          Prior Function            PT Goals (current goals can now be found in the care plan section) Acute Rehab PT Goals Patient Stated Goal: back to independence Progress towards PT goals: Progressing toward goals    Frequency    Min 4X/week      PT Plan Current plan remains appropriate    Co-evaluation              AM-PAC PT "6 Clicks" Mobility   Outcome Measure  Help needed turning from your back to your side while in a flat bed without using bedrails?: A Little Help needed moving from lying on your back to sitting on the side of a flat bed without using bedrails?: A Lot Help needed moving to and from a bed to a chair (including a wheelchair)?: A Lot Help needed standing up from a chair using your arms (e.g., wheelchair or bedside chair)?: A Lot Help needed to walk in hospital room?: A Lot Help needed climbing 3-5 steps with a railing? : Total 6 Click Score: 12    End of Session Equipment Utilized During Treatment: Gait belt Activity Tolerance: Patient tolerated treatment well Patient left: in chair;with call bell/phone within reach;with chair alarm set Nurse Communication: Mobility status PT Visit Diagnosis: Other abnormalities of gait and mobility (R26.89);History of falling (Z91.81)     Time: 1610-9604 PT Time Calculation (min) (ACUTE ONLY): 25 min  Charges:  $Gait Training: 8-22 mins $Therapeutic Activity: 8-22 mins                     Ferd Glassing., PT  Office # [phone]    Ilda Foil 01/29/2023, 9:35 AM

## 2023-01-29 NOTE — Progress Notes (Signed)
STROKE TEAM PROGRESS NOTE   SUBJECTIVE (INTERVAL HISTORY) No family is at the bedside.  Patient sitting in bed for lunch.  Her left upper extremity strength much improved, she still has left lower quadrantanopsia and left sensory intention.  Pending CIR.   OBJECTIVE Temp:  [97.8 F (36.6 C)-98.7 F (37.1 C)] 98.5 F (36.9 C) (05/31 1559) Pulse Rate:  [70-86] 73 (05/31 1559) Cardiac Rhythm: Normal sinus rhythm (05/31 0813) Resp:  [16-19] 19 (05/31 1559) BP: (122-151)/(71-95) 145/76 (05/31 1559) SpO2:  [90 %-98 %] 97 % (05/31 1559)  Recent Labs  Lab 01/27/23 1909 01/27/23 2344 01/28/23 0307 01/28/23 0714 01/28/23 1218  GLUCAP 126* 122* 106* 82 105*   Recent Labs  Lab 01/27/23 1126 01/27/23 1128 01/28/23 0250  NA 136 139 134*  K 3.0* 2.9* 3.5  CL 101 99 101  CO2 25  --  23  GLUCOSE 127* 121* 113*  BUN 9 10 8   CREATININE 0.89 0.80 0.75  CALCIUM 9.0  --  8.4*   Recent Labs  Lab 01/27/23 1126  AST 24  ALT 16  ALKPHOS 70  BILITOT 0.4  PROT 7.4  ALBUMIN 3.8   Recent Labs  Lab 01/27/23 1126 01/27/23 1128 01/28/23 0250  WBC 9.5  --  6.5  NEUTROABS 1.8  --  4.4  HGB 13.1 13.9 11.9*  HCT 39.3 41.0 35.0*  MCV 87.9  --  86.2  PLT 243  --  201   No results for input(s): "CKTOTAL", "CKMB", "CKMBINDEX", "TROPONINI" in the last 168 hours. Recent Labs    01/27/23 1126 01/28/23 0250 01/29/23 0418  LABPROT 26.5* 24.9* 23.9*  INR 2.4* 2.2* 2.1*   No results for input(s): "COLORURINE", "LABSPEC", "PHURINE", "GLUCOSEU", "HGBUR", "BILIRUBINUR", "KETONESUR", "PROTEINUR", "UROBILINOGEN", "NITRITE", "LEUKOCYTESUR" in the last 72 hours.  Invalid input(s): "APPERANCEUR"     Component Value Date/Time   CHOL 115 01/28/2023 0250   CHOL 145 10/17/2015 1017   TRIG 57 01/28/2023 0250   HDL 51 01/28/2023 0250   HDL 43 10/17/2015 1017   CHOLHDL 2.3 01/28/2023 0250   VLDL 11 01/28/2023 0250   LDLCALC 53 01/28/2023 0250   LDLCALC 55 10/06/2022 1038   Lab Results   Component Value Date   HGBA1C 6.2 (H) 01/28/2023   No results found for: "LABOPIA", "COCAINSCRNUR", "LABBENZ", "AMPHETMU", "THCU", "LABBARB"  Recent Labs  Lab 01/27/23 1126  ETH <10    I have personally reviewed the radiological images below and agree with the radiology interpretations.  VAS US CAROTID  Result Date: 01/29/2023 Carotid Arterial Duplex Study Patient Name:  Mary [name]  Date of Exam:   01/28/2023 Medical Rec #: [id number]      Accession #:    [id number] Date of Birth: 1950/11/13       Patient Gender: F Patient Age:   72 years Exam Location:  [facility] Procedure:      VAS US CAROTID Referring Phys: Scheryl Marten Racheal Mathurin --------------------------------------------------------------------------------  Indications:       CVA, Weakness and right carotid mural thrombus, status post                    thrombectomy with residual 55-60% ICA stenosis. Risk Factors:      Hypertension, hyperlipidemia, current smoker, coronary artery                    disease, PAD. Other Factors:     On Coumadin for clotting disorder. Comparison Study:  No prior study Performing Technologist:  Sherren Kerns RVS  Examination Guidelines: A complete evaluation includes B-mode imaging, spectral Doppler, color Doppler, and power Doppler as needed of all accessible portions of each vessel. Bilateral testing is considered an integral part of a complete examination. Limited examinations for reoccurring indications may be performed as noted.  Right Carotid Findings: +----------+--------+--------+--------+------------------+------------------+           PSV cm/sEDV cm/sStenosisPlaque DescriptionComments           +----------+--------+--------+--------+------------------+------------------+ CCA Prox  89      17                                intimal thickening +----------+--------+--------+--------+------------------+------------------+ CCA Mid                           focal and calcific                    +----------+--------+--------+--------+------------------+------------------+ CCA Distal82      17                                intimal thickening +----------+--------+--------+--------+------------------+------------------+ ICA Prox  77      20      1-39%   calcific          Shadowing          +----------+--------+--------+--------+------------------+------------------+ ICA Mid   108     20                                                   +----------+--------+--------+--------+------------------+------------------+ ICA Distal91      27                                                   +----------+--------+--------+--------+------------------+------------------+ ECA       117     11                                                   +----------+--------+--------+--------+------------------+------------------+ +----------+--------+-------+--------+-------------------+           PSV cm/sEDV cmsDescribeArm Pressure (mmHG) +----------+--------+-------+--------+-------------------+ Subclavian70                                         +----------+--------+-------+--------+-------------------+ +---------+--------+--+--------+--+ VertebralPSV cm/s70EDV cm/s14 +---------+--------+--+--------+--+  Left Carotid Findings: +----------+--------+--------+--------+------------------+------------------+           PSV cm/sEDV cm/sStenosisPlaque DescriptionComments           +----------+--------+--------+--------+------------------+------------------+ CCA Prox  85      21                                intimal thickening +----------+--------+--------+--------+------------------+------------------+ CCA Distal81      16  intimal thickening +----------+--------+--------+--------+------------------+------------------+ ICA Prox  82      23      1-39%   calcific                              +----------+--------+--------+--------+------------------+------------------+ ICA Mid   77      21                                                   +----------+--------+--------+--------+------------------+------------------+ ICA Distal75      23                                                   +----------+--------+--------+--------+------------------+------------------+ ECA       77      12                                                   +----------+--------+--------+--------+------------------+------------------+ +----------+--------+--------+--------+-------------------+           PSV cm/sEDV cm/sDescribeArm Pressure (mmHG) +----------+--------+--------+--------+-------------------+ [id number]                                          +----------+--------+--------+--------+-------------------+ +---------+--------+--+--------+--+ VertebralPSV cm/s72EDV cm/s16 +---------+--------+--+--------+--+   Summary: Right Carotid: Velocities in the right ICA are consistent with a 1-39% stenosis. Left Carotid: Velocities in the left ICA are consistent with a 1-39% stenosis. Vertebrals:  Bilateral vertebral arteries demonstrate antegrade flow. Subclavians: Normal flow hemodynamics were seen in bilateral subclavian              arteries. *See table(s) above for measurements and observations.  Electronically signed by Delia Heady MD on 01/29/2023 at 9:23:08 AM.    Final    IR PERCUTANEOUS ART THROMBECTOMY/INFUSION INTRACRANIAL INC DIAG ANGIO  Result Date: 01/29/2023 INDICATION: New onset right gaze deviation and left-sided neglect, and left hemiparesis. Near occlusive prominent thrombus in the right internal carotid artery. EXAM: 1. EMERGENT LARGE VESSEL OCCLUSION THROMBOLYSIS (anterior CIRCULATION) COMPARISON:  CT angiogram of the head and neck of Jan 27, 2023. MEDICATIONS: Ancef 2 g IV antibiotic was administered within 1 hour of the procedure. ANESTHESIA/SEDATION: General  anesthesia. CONTRAST:  Omnipaque 300 approximately 80 mL. FLUOROSCOPY TIME:  Fluoroscopy Time: 17 minutes 24 seconds (947 mGy). COMPLICATIONS: None immediate. TECHNIQUE: Following a full explanation of the procedure along with the potential associated complications, an informed witnessed consent was obtained. The risks of intracranial hemorrhage of 10%, worsening neurological deficit, ventilator dependency, death and inability to revascularize were all reviewed in detail with the patient. The patient was then put under general anesthesia by the Department of Anesthesiology at [facility]. The right groin was prepped and draped in the usual sterile fashion. Thereafter using modified Seldinger technique, transfemoral access into the right common femoral artery was obtained without difficulty. Over an 0.035 inch guidewire an 8 French 25 cm Arrow sheath was inserted. Through this, and also over an 0.035 inch guidewire a combination  of a Berenstein 125 cm 5.5 [place] support catheter inside of an 087 95 cm balloon guide catheter was advanced to the right common carotid artery. The guidewire, and the support catheter were removed. Arteriograms were then performed through the balloon guide catheter extra cranially and intracranially. FINDINGS: The right common carotid bifurcation demonstrates the right external carotid artery and its major branches to be widely patent. The right internal carotid artery at the bulb has a circumferential atherosclerotic plaque associated with an approximately 3 cm irregular filling defect nearly occluding the internal carotid artery. More distally, patency is maintained of the distal cervical right ICA, the petrous, the cavernous and the supraclinoid right ICA. A right posterior communicating artery is seen opacifying the right posterior cerebral artery distribution. The right middle cerebral artery opacifies into the capillary and venous phases. A hypoplastic right A1 segment is  noted with continuation into the A2 region. PROCEDURE: Through the balloon guide catheter, an 021 150 cm microcatheter was advanced over an 014 inch soft tip micro guidewire with a moderate J configuration to just proximal to the right internal carotid artery origin. At this time, proximal flow arrest was initiated by inflating the balloon guide catheter in the distal right common carotid artery. Also a combination of the micro guidewire and microcatheter was advanced through the internal carotid artery, slight proximal aspiration was applied with a 20 mL syringe as the combination was navigated to the petrous segment of the right ICA. The aspiration was then stopped. The micro guidewire was removed. A 6.5 mm x 47 mm Embotrap retrieved retrieval device was then advanced to the distal end of the microcatheter and deployed such that the proximal portion of the device was in the distal cervical right ICA. The microcatheter was removed. With the proximal flow arrest intact, proximal aspiration was then performed at the hub of the balloon guide catheter with a Penumbra aspiration device, as the retriever was removed. An 071 Zoom aspiration catheter was then advanced over an 014 microcatheter as aspiration was performed. The 071 Zoom aspiration catheter was advanced into the right internal carotid artery proximally and distally and then distal to proximal flow arrest still intact. Thick irregular grayish white clot fragments were retained in the retriever, and in the canister. Following reversal of flow arrest in the right common carotid artery, a control arteriogram performed through the balloon guide catheter revealed no residual filling defects in the right internal carotid artery extra cranially and intracranially. Right MCA remained widely patent without evidence of intraluminal filling defects, or of occlusions. The non dominant right A1 and A2 segments also demonstrated no changes. The balloon guide wire was  removed. Manual compression was applied with quick clot for 25 minutes to obtain hemostasis at the right groin puncture site. Distal pulses remained Dopplerable in both feet unchanged at the end of procedure. A flat panel CT of the brain demonstrated no evidence of intracranial hemorrhage. The patient's general anesthesia was then reversed and the patient was extubated. Upon recovery, the patient was able to obey simple commands though had no movement in her left arm and leg. She was then transferred to the neuro ICU for post revascularization care. IMPRESSION: Status post right ICA extra cranially with 1 pass with a 6.5 mm x 47 mm Emboshield retrieval device and aspiration achieving complete revascularization. Approximately 65% stenosis of the right internal carotid artery at the bulb. PLAN: We will probably need placement of a stent at the right internal carotid artery proximally to prevent future  ischemic strokes in the right MCA distribution. Electronically Signed   By: Julieanne Cotton M.D.   On: 01/29/2023 08:42   IR CT Head Ltd  Result Date: 01/29/2023 INDICATION: New onset right gaze deviation and left-sided neglect, and left hemiparesis. Near occlusive prominent thrombus in the right internal carotid artery. EXAM: 1. EMERGENT LARGE VESSEL OCCLUSION THROMBOLYSIS (anterior CIRCULATION) COMPARISON:  CT angiogram of the head and neck of Jan 27, 2023. MEDICATIONS: Ancef 2 g IV antibiotic was administered within 1 hour of the procedure. ANESTHESIA/SEDATION: General anesthesia. CONTRAST:  Omnipaque 300 approximately 80 mL. FLUOROSCOPY TIME:  Fluoroscopy Time: 17 minutes 24 seconds (947 mGy). COMPLICATIONS: None immediate. TECHNIQUE: Following a full explanation of the procedure along with the potential associated complications, an informed witnessed consent was obtained. The risks of intracranial hemorrhage of 10%, worsening neurological deficit, ventilator dependency, death and inability to revascularize  were all reviewed in detail with the patient. The patient was then put under general anesthesia by the Department of Anesthesiology at [facility]. The right groin was prepped and draped in the usual sterile fashion. Thereafter using modified Seldinger technique, transfemoral access into the right common femoral artery was obtained without difficulty. Over an 0.035 inch guidewire an 8 French 25 cm Arrow sheath was inserted. Through this, and also over an 0.035 inch guidewire a combination of a Berenstein 125 cm 5.5 [place] support catheter inside of an 087 95 cm balloon guide catheter was advanced to the right common carotid artery. The guidewire, and the support catheter were removed. Arteriograms were then performed through the balloon guide catheter extra cranially and intracranially. FINDINGS: The right common carotid bifurcation demonstrates the right external carotid artery and its major branches to be widely patent. The right internal carotid artery at the bulb has a circumferential atherosclerotic plaque associated with an approximately 3 cm irregular filling defect nearly occluding the internal carotid artery. More distally, patency is maintained of the distal cervical right ICA, the petrous, the cavernous and the supraclinoid right ICA. A right posterior communicating artery is seen opacifying the right posterior cerebral artery distribution. The right middle cerebral artery opacifies into the capillary and venous phases. A hypoplastic right A1 segment is noted with continuation into the A2 region. PROCEDURE: Through the balloon guide catheter, an 021 150 cm microcatheter was advanced over an 014 inch soft tip micro guidewire with a moderate J configuration to just proximal to the right internal carotid artery origin. At this time, proximal flow arrest was initiated by inflating the balloon guide catheter in the distal right common carotid artery. Also a combination of the micro guidewire and  microcatheter was advanced through the internal carotid artery, slight proximal aspiration was applied with a 20 mL syringe as the combination was navigated to the petrous segment of the right ICA. The aspiration was then stopped. The micro guidewire was removed. A 6.5 mm x 47 mm Embotrap retrieved retrieval device was then advanced to the distal end of the microcatheter and deployed such that the proximal portion of the device was in the distal cervical right ICA. The microcatheter was removed. With the proximal flow arrest intact, proximal aspiration was then performed at the hub of the balloon guide catheter with a Penumbra aspiration device, as the retriever was removed. An 071 Zoom aspiration catheter was then advanced over an 014 microcatheter as aspiration was performed. The 071 Zoom aspiration catheter was advanced into the right internal carotid artery proximally and distally and then distal to proximal flow arrest  still intact. Thick irregular grayish white clot fragments were retained in the retriever, and in the canister. Following reversal of flow arrest in the right common carotid artery, a control arteriogram performed through the balloon guide catheter revealed no residual filling defects in the right internal carotid artery extra cranially and intracranially. Right MCA remained widely patent without evidence of intraluminal filling defects, or of occlusions. The non dominant right A1 and A2 segments also demonstrated no changes. The balloon guide wire was removed. Manual compression was applied with quick clot for 25 minutes to obtain hemostasis at the right groin puncture site. Distal pulses remained Dopplerable in both feet unchanged at the end of procedure. A flat panel CT of the brain demonstrated no evidence of intracranial hemorrhage. The patient's general anesthesia was then reversed and the patient was extubated. Upon recovery, the patient was able to obey simple commands though had no  movement in her left arm and leg. She was then transferred to the neuro ICU for post revascularization care. IMPRESSION: Status post right ICA extra cranially with 1 pass with a 6.5 mm x 47 mm Emboshield retrieval device and aspiration achieving complete revascularization. Approximately 65% stenosis of the right internal carotid artery at the bulb. PLAN: We will probably need placement of a stent at the right internal carotid artery proximally to prevent future ischemic strokes in the right MCA distribution. Electronically Signed   By: Julieanne Cotton M.D.   On: 01/29/2023 08:42   ECHOCARDIOGRAM COMPLETE  Result Date: 01/28/2023    ECHOCARDIOGRAM REPORT   Patient Name:   Mary [name] Date of Exam: 01/28/2023 Medical Rec #:  [id number]     Height:       64.0 in Accession #:    [id number]    Weight:       112.7 lb Date of Birth:  11/10/1950      BSA:          1.533 m Patient Age:    23 years      BP:           124/71 mmHg Patient Gender: F             HR:           68 bpm. Exam Location:  Inpatient Procedure: 2D Echo, Color Doppler and Cardiac Doppler Indications:    Stroke  History:        Patient has prior history of Echocardiogram examinations and                 Patient has no prior history of Echocardiogram examinations.                 Right ICA occlusion, PAD, Smoker, HTN, HLD, CAD.  Sonographer:    Milbert Coulter Referring Phys: [id number] Yousra Ivens IMPRESSIONS  1. Left ventricular ejection fraction, by estimation, is 55 to 60%. The left ventricle has normal function. The left ventricle has no regional wall motion abnormalities. Left ventricular diastolic parameters are consistent with Grade I diastolic dysfunction (impaired relaxation).  2. Right ventricular systolic function is normal. The right ventricular size is normal. Tricuspid regurgitation signal is inadequate for assessing PA pressure.  3. The mitral valve is grossly normal. Trivial mitral valve regurgitation.  4. The aortic valve is tricuspid.  There is mild calcification of the aortic valve. There is mild thickening of the aortic valve. Aortic valve regurgitation is not visualized. Aortic valve sclerosis/calcification is present, without any evidence of aortic stenosis.  5. The inferior vena cava is normal in size with greater than 50% respiratory variability, suggesting right atrial pressure of 3 mmHg. Comparison(s): No prior Echocardiogram. Conclusion(s)/Recommendation(s): No intracardiac source of embolism detected on this transthoracic study. Consider a transesophageal echocardiogram to exclude cardiac source of embolism if clinically indicated. FINDINGS  Left Ventricle: Left ventricular ejection fraction, by estimation, is 55 to 60%. The left ventricle has normal function. The left ventricle has no regional wall motion abnormalities. The left ventricular internal cavity size was normal in size. There is  no left ventricular hypertrophy. Left ventricular diastolic parameters are consistent with Grade I diastolic dysfunction (impaired relaxation). Right Ventricle: The right ventricular size is normal. No increase in right ventricular wall thickness. Right ventricular systolic function is normal. Tricuspid regurgitation signal is inadequate for assessing PA pressure. Left Atrium: Left atrial size was normal in size. Right Atrium: Right atrial size was normal in size. Pericardium: There is no evidence of pericardial effusion. Mitral Valve: The mitral valve is grossly normal. Trivial mitral valve regurgitation. Tricuspid Valve: The tricuspid valve is normal in structure. Tricuspid valve regurgitation is trivial. Aortic Valve: The aortic valve is tricuspid. There is mild calcification of the aortic valve. There is mild thickening of the aortic valve. Aortic valve regurgitation is not visualized. Aortic valve sclerosis/calcification is present, without any evidence of aortic stenosis. Aortic valve mean gradient measures 4.0 mmHg. Aortic valve peak gradient  measures 7.3 mmHg. Aortic valve area, by VTI measures 1.23 cm. Pulmonic Valve: The pulmonic valve was not well visualized. Pulmonic valve regurgitation is trivial. Aorta: The aortic root is normal in size and structure. Venous: The inferior vena cava is normal in size with greater than 50% respiratory variability, suggesting right atrial pressure of 3 mmHg. IAS/Shunts: The atrial septum is grossly normal.  LEFT VENTRICLE PLAX 2D LVIDd:         3.60 cm     Diastology LVIDs:         2.50 cm     LV e' medial:    9.68 cm/s LV PW:         1.10 cm     LV E/e' medial:  8.9 LV IVS:        1.00 cm     LV e' lateral:   12.70 cm/s LVOT diam:     1.70 cm     LV E/e' lateral: 6.7 LV SV:         41 LV SV Index:   27 LVOT Area:     2.27 cm  LV Volumes (MOD) LV vol d, MOD A2C: 38.2 ml LV vol d, MOD A4C: 42.6 ml LV vol s, MOD A2C: 10.4 ml LV vol s, MOD A4C: 16.1 ml LV SV MOD A2C:     27.8 ml LV SV MOD A4C:     42.6 ml LV SV MOD BP:      28.3 ml RIGHT VENTRICLE RV Basal diam:  2.70 cm RV Mid diam:    2.30 cm RV S prime:     11.70 cm/s TAPSE (M-mode): 2.3 cm LEFT ATRIUM             Index        RIGHT ATRIUM           Index LA diam:        2.50 cm 1.63 cm/m   RA Area:     12.10 cm LA Vol (A2C):   34.1 ml 22.25 ml/m  RA Volume:   27.70  ml  18.07 ml/m LA Vol (A4C):   27.2 ml 17.74 ml/m LA Biplane Vol: 31.1 ml 20.29 ml/m  AORTIC VALVE AV Area (Vmax):    1.59 cm AV Area (Vmean):   1.42 cm AV Area (VTI):     1.23 cm AV Vmax:           135.00 cm/s AV Vmean:          97.300 cm/s AV VTI:            0.331 m AV Peak Grad:      7.3 mmHg AV Mean Grad:      4.0 mmHg LVOT Vmax:         94.60 cm/s LVOT Vmean:        60.700 cm/s LVOT VTI:          0.179 m LVOT/AV VTI ratio: 0.54  AORTA Ao Root diam: 3.10 cm Ao Asc diam:  2.50 cm MITRAL VALVE MV Area (PHT): 4.06 cm    SHUNTS MV Decel Time: 187 msec    Systemic VTI:  0.18 m MV E velocity: 85.70 cm/s  Systemic Diam: 1.70 cm MV A velocity: 85.70 cm/s MV E/A ratio:  1.00 Mary Flatten MD  Electronically signed by Mary Flatten MD Signature Date/Time: 01/28/2023/3:48:06 PM    Final    MR BRAIN WO CONTRAST  Result Date: 01/27/2023 CLINICAL DATA:  Stroke, follow-up EXAM: MRI HEAD WITHOUT CONTRAST TECHNIQUE: Multiplanar, multiecho pulse sequences of the brain and surrounding structures were obtained without intravenous contrast. COMPARISON:  No prior MRI available, correlation is made with CT head 01/27/2023 FINDINGS: Brain: Scattered foci of restricted diffusion with ADC correlate throughout the right cerebral hemisphere, with more focal and contiguous areas of restricted diffusion in the right frontal and parietal cortex (series 5, image 89), watershed territory (series 5, image 85), right caudate and lentiform nucleus (series 5, image 78), and right occipital lobe (series 5, image 68-70). No acute hemorrhage, mass, mass effect, or midline shift. No hydrocephalus or extra-axial collection. No hemosiderin deposition to suggest remote hemorrhage. Scattered and confluent T2 hyperintense signal in the periventricular white matter and pons, likely the sequela of moderate to severe chronic small vessel ischemic disease. Normal cerebral volume for age. Vascular: Normal arterial flow voids. Skull and upper cervical spine: Normal marrow signal. Sinuses/Orbits: Mucosal thickening in the left ethmoid air cells. No acute finding in the orbits. Other: The mastoids are well aerated. IMPRESSION: Scattered acute infarcts throughout the right cerebral hemisphere, with more focal areas of infarction in the right frontal and parietal cortex, watershed territory, right caudate and lentiform nucleus, and right occipital lobe. These results will be called to the ordering clinician or representative by the Radiologist Assistant, and communication documented in the PACS or [employer]. Electronically Signed   By: Wiliam Ke M.D.   On: 01/27/2023 23:45   CT HEAD CODE STROKE WO CONTRAST  Result Date:  01/27/2023 CLINICAL DATA:  Code stroke.  Left-sided weakness, right-sided gaze. EXAM: CT ANGIOGRAPHY HEAD AND NECK TECHNIQUE: Multidetector CT imaging of the head and neck was performed using the standard protocol during bolus administration of intravenous contrast. Multiplanar CT image reconstructions and MIPs were obtained to evaluate the vascular anatomy. Carotid stenosis measurements (when applicable) are obtained utilizing NASCET criteria, using the distal internal carotid diameter as the denominator. RADIATION DOSE REDUCTION: This exam was performed according to the departmental dose-optimization program which includes automated exposure control, adjustment of the mA and/or kV according to patient size and/or use  of iterative reconstruction technique. CONTRAST:  75 cc Omnipaque 350 COMPARISON:  CT head 09/29/2021 FINDINGS: CT HEAD FINDINGS Brain: There is no evidence of acute intracranial hemorrhage, extra-axial fluid collection, or acute territorial infarct. Parenchymal volume is normal. The ventricles are normal in size. Gray-white differentiation is preserved. There is patchy hypodensity in the supratentorial white matter likely reflecting sequela of advanced chronic small-vessel ischemic change. Pituitary and suprasellar region are normal. There is no mass lesion there is no mass effect or midline shift. Vascular: No hyperdense vessel is seen. Skull: Normal. Negative for fracture or focal lesion. Sinuses/Orbits: The paranasal sinuses are clear. The globes and orbits are unremarkable. Other: None. ASPECTS [facility] Stroke Program Early CT Score) - Ganglionic level infarction (caudate, lentiform nuclei, internal capsule, insula, M1-M3 cortex): 7 - Supraganglionic infarction (M4-M6 cortex): 3 Total score (0-10 with 10 being normal): 10 CTA NECK FINDINGS Aortic arch: There is mild calcified plaque in the imaged aortic arch. The origins of the major branch vessels are patent. The subclavian arteries are patent  to the level imaged. Right carotid system: The right common carotid artery is patent. There is mixed plaque of the bifurcation with noncalcified plaque projecting into the lumen resulting in hemodynamically significant stenosis just after the bifurcation, estimated at 60%. Distally, there is additional mural adherent clot in the lumen of the ICA resulting in high-grade stenosis, estimated at 80%. There is no raised dissection flap. The distal internal carotid artery is patent. The external carotid artery is patent. There is no evidence of aneurysm/pseudoaneurysm. Left carotid system: The left common carotid artery is patent. There is calcified plaque at the bifurcation without hemodynamically significant stenosis or occlusion. The internal and external carotid arteries are patent. There is no evidence of dissection or aneurysm/pseudoaneurysm. Vertebral arteries: The vertebral arteries are patent, without hemodynamically significant stenosis or occlusion. There is no evidence of dissection or aneurysm/pseudoaneurysm. Skeleton: There is no acute osseous abnormality or suspicious osseous lesion. There is no visible canal hematoma. Other neck: There is extensive dental disease. The soft tissues of the neck are unremarkable. Upper chest: There is emphysema in the lung apices. Review of the MIP images confirms the above findings CTA HEAD FINDINGS Anterior circulation: There is calcified plaque in the intracranial ICAs without significant stenosis or occlusion. The right M1 segment is patent. The branch vessels are patent, without proximal stenosis or occlusion. The left MCA is patent, without proximal stenosis or occlusion. The bilateral ACAs are patent, without proximal stenosis or occlusion. There is no aneurysm or AVM. Posterior circulation: The bilateral V4 segments are patent. The basilar artery is patent. The major cerebellar arteries appear patent. The bilateral PCAs are patent, without proximal stenosis or  occlusion. There is a fetal origin of the right PCA. A left posterior communicating artery is also identified. There is no aneurysm or AVM. Venous sinuses: Patent. Anatomic variants: As above. Review of the MIP images confirms the above findings IMPRESSION: 1. No acute intracranial pathology.  No evidence of evolved infarct. 2. Mural adherent clot in the right internal carotid artery after the bifurcation resulting in high-grade stenosis, estimated at 80%. 3. More proximally in the right internal carotid artery just after the bifurcation, mixed plaque results in proximally 60% stenosis. 4. Mild plaque at the left carotid bifurcation without significant stenosis. Patent vertebral arteries in the neck. 5. Patent intracranial vasculature. 6. Emphysema. Findings of the noncontrast head CT were communicated to Dr Roda Shutters at 11:32 am. The the CTA results were discussed at 11:43 am.  Electronically Signed   By: Lesia Hausen M.D.   On: 01/27/2023 11:57   CT ANGIO HEAD NECK W WO CM (CODE STROKE)  Result Date: 01/27/2023 CLINICAL DATA:  Code stroke.  Left-sided weakness, right-sided gaze. EXAM: CT ANGIOGRAPHY HEAD AND NECK TECHNIQUE: Multidetector CT imaging of the head and neck was performed using the standard protocol during bolus administration of intravenous contrast. Multiplanar CT image reconstructions and MIPs were obtained to evaluate the vascular anatomy. Carotid stenosis measurements (when applicable) are obtained utilizing NASCET criteria, using the distal internal carotid diameter as the denominator. RADIATION DOSE REDUCTION: This exam was performed according to the departmental dose-optimization program which includes automated exposure control, adjustment of the mA and/or kV according to patient size and/or use of iterative reconstruction technique. CONTRAST:  75 cc Omnipaque 350 COMPARISON:  CT head 09/29/2021 FINDINGS: CT HEAD FINDINGS Brain: There is no evidence of acute intracranial hemorrhage, extra-axial  fluid collection, or acute territorial infarct. Parenchymal volume is normal. The ventricles are normal in size. Gray-white differentiation is preserved. There is patchy hypodensity in the supratentorial white matter likely reflecting sequela of advanced chronic small-vessel ischemic change. Pituitary and suprasellar region are normal. There is no mass lesion there is no mass effect or midline shift. Vascular: No hyperdense vessel is seen. Skull: Normal. Negative for fracture or focal lesion. Sinuses/Orbits: The paranasal sinuses are clear. The globes and orbits are unremarkable. Other: None. ASPECTS [facility] Stroke Program Early CT Score) - Ganglionic level infarction (caudate, lentiform nuclei, internal capsule, insula, M1-M3 cortex): 7 - Supraganglionic infarction (M4-M6 cortex): 3 Total score (0-10 with 10 being normal): 10 CTA NECK FINDINGS Aortic arch: There is mild calcified plaque in the imaged aortic arch. The origins of the major branch vessels are patent. The subclavian arteries are patent to the level imaged. Right carotid system: The right common carotid artery is patent. There is mixed plaque of the bifurcation with noncalcified plaque projecting into the lumen resulting in hemodynamically significant stenosis just after the bifurcation, estimated at 60%. Distally, there is additional mural adherent clot in the lumen of the ICA resulting in high-grade stenosis, estimated at 80%. There is no raised dissection flap. The distal internal carotid artery is patent. The external carotid artery is patent. There is no evidence of aneurysm/pseudoaneurysm. Left carotid system: The left common carotid artery is patent. There is calcified plaque at the bifurcation without hemodynamically significant stenosis or occlusion. The internal and external carotid arteries are patent. There is no evidence of dissection or aneurysm/pseudoaneurysm. Vertebral arteries: The vertebral arteries are patent, without hemodynamically  significant stenosis or occlusion. There is no evidence of dissection or aneurysm/pseudoaneurysm. Skeleton: There is no acute osseous abnormality or suspicious osseous lesion. There is no visible canal hematoma. Other neck: There is extensive dental disease. The soft tissues of the neck are unremarkable. Upper chest: There is emphysema in the lung apices. Review of the MIP images confirms the above findings CTA HEAD FINDINGS Anterior circulation: There is calcified plaque in the intracranial ICAs without significant stenosis or occlusion. The right M1 segment is patent. The branch vessels are patent, without proximal stenosis or occlusion. The left MCA is patent, without proximal stenosis or occlusion. The bilateral ACAs are patent, without proximal stenosis or occlusion. There is no aneurysm or AVM. Posterior circulation: The bilateral V4 segments are patent. The basilar artery is patent. The major cerebellar arteries appear patent. The bilateral PCAs are patent, without proximal stenosis or occlusion. There is a fetal origin of the right PCA.  A left posterior communicating artery is also identified. There is no aneurysm or AVM. Venous sinuses: Patent. Anatomic variants: As above. Review of the MIP images confirms the above findings IMPRESSION: 1. No acute intracranial pathology.  No evidence of evolved infarct. 2. Mural adherent clot in the right internal carotid artery after the bifurcation resulting in high-grade stenosis, estimated at 80%. 3. More proximally in the right internal carotid artery just after the bifurcation, mixed plaque results in proximally 60% stenosis. 4. Mild plaque at the left carotid bifurcation without significant stenosis. Patent vertebral arteries in the neck. 5. Patent intracranial vasculature. 6. Emphysema. Findings of the noncontrast head CT were communicated to Dr Roda Shutters at 11:32 am. The the CTA results were discussed at 11:43 am. Electronically Signed   By: Lesia Hausen M.D.   On:  01/27/2023 11:57     PHYSICAL EXAM  Temp:  [97.8 F (36.6 C)-98.7 F (37.1 C)] 98.5 F (36.9 C) (05/31 1559) Pulse Rate:  [70-86] 73 (05/31 1559) Resp:  [16-19] 19 (05/31 1559) BP: (122-151)/(71-95) 145/76 (05/31 1559) SpO2:  [90 %-98 %] 97 % (05/31 1559)  General - Well nourished, well developed, in no apparent distress.  Ophthalmologic - fundi not visualized due to noncooperation.  Cardiovascular - Regular rhythm and rate.  Neuro - awake, alert, eyes open, orientated to age, place, time. No aphasia, fluent language, following all simple commands. Able to name and repeat, mild dysarthria. Right gaze preference but able to have left gaze, left lower quadrantanopia. Left mild facial droop. Tongue midline. LUE 4/5 proximal and distally, BLEs and RUE 5/5. Sensation symmetrical bilaterally however with LUE simultagnosia, b/l FTN intact but slow on the left, gait not tested.    ASSESSMENT/PLAN Ms. LANDYNN MACADAMS is a 72 y.o. female with history of PAD, CAD, smoker, HTN, HLD, on chronic coumadin presented for left sided weakness, right forced gaze and left neglect. NIHSS = 14. CT no acute finding. INR 2.4. CTA showed right carotid mural thrombus with high grade stenosis. Not TNK candidate due to high INR. Given severe neuro deficit and right ICA flow limiting on imaging, discussed with Dr. Corliss Skains, activated code IR.    Stroke:  right MCA and MCA/ACA scattered infarct embolic from intramural right ICA thrombus, secondary to ? Coagulation disorder ? Even on coumadin with therapeutic INR CT no acute finding CTA showed right carotid mural thrombus with high grade stenosis 80%. More proximally in the right internal carotid artery just after the bifurcation, mixed plaque results in proximally 60% stenosis. MRI  Scattered acute infarcts throughout the right cerebral hemisphere, with more focal areas of infarction in the right frontal and parietal cortex, watershed territory, right caudate and  lentiform nucleus, and right occipital lobe. Carotid Doppler 1 to 39% bilaterally 2D Echo EF 55 to 60% LDL 53 HgbA1c 6.2 Coumadin for VTE prophylaxis warfarin daily prior to admission, now on aspirin 325 mg daily and warfarin daily, will need to increase the goal INR to 2.5-3.5. once INR at goal, ASA can be discontinued. Patient counseled to be compliant with her antithrombotic medications Ongoing aggressive stroke risk factor management Therapy recommendations: CIR Disposition:  pending  ? Coagulation disorder On Coumadin at home PTA INR 2.4->2.2->2.1 Given developed carotid intramural thrombus in the setting of therapeutic INR, will increase INR goal to 2.5-3.5.  Hypertension Stable Off Cleviprex BP goal less than 180/105 Long term BP goal normotensive  Hyperlipidemia Home meds: Lipitor 10 LDL 53, goal < 70 Now on Lipitor 10 No high  intensity statin given LDL within goal Continue statin at discharge  Tobacco abuse Current smoker Smoking cessation counseling provided Nicotine patch provided Pt is willing to quit  Other Stroke Risk Factors Advanced age PAD CAD  Other Active Problems Mild hyponatremia, sodium 134  Hospital day # 2   Marvel Plan, MD PhD Stroke Neurology 01/29/2023 6:31 PM    To contact Stroke Continuity provider, please refer to [email or web address]. After hours, contact General Neurology

## 2023-01-29 NOTE — Progress Notes (Signed)
ANTICOAGULATION CONSULT NOTE  Pharmacy Consult:  Coumadin Indication:  PVD  No Known Allergies  Patient Measurements: Height: 5\' 4"  (162.6 cm) Weight: 51.1 kg (112 lb 10.5 oz) IBW/kg (Calculated) : 54.7  Vital Signs: Temp: 97.8 F (36.6 C) (05/31 0807) Temp Source: Oral (05/31 0807) BP: 148/82 (05/31 0807) Pulse Rate: 72 (05/31 0807)  Labs: Recent Labs    01/27/23 1126 01/27/23 1128 01/28/23 0250 01/29/23 0418  HGB 13.1 13.9 11.9*  --   HCT 39.3 41.0 35.0*  --   PLT 243  --  201  --   APTT 30  --   --   --   LABPROT 26.5*  --  24.9* 23.9*  INR 2.4*  --  2.2* 2.1*  CREATININE 0.89 0.80 0.75  --      Estimated Creatinine Clearance: 53.5 mL/min (by C-G formula based on SCr of 0.75 mg/dL).   Medical History: Past Medical History:  Diagnosis Date   Arthritis    Hyperlipidemia    Hypertension    Hyperuricemia    Osteoporosis    PAD (peripheral artery disease) (HCC) 02/2010   angiplasty, popliteal artery   Tobacco abuse     Assessment: 72 yo F on Coumadin 5mg  PO daily except 3mg  one day per week PTA for history of PVD.  INR was therapeutic at 2.4 and patient presented with scattered infarcts.  INR goal increased to 2.5-3.5 per Neuro.  No hemorrhage and Pharmacy consulted to resume Coumadin.  ASA also started.  INR currently sub-therapeutic at 2.1; no bleeding reported.  Goal of Therapy:  INR goal 2.5-3.5 per Neuro Monitor platelets by anticoagulation protocol: Yes   Plan:  Increase Coumadin to 7.5 mg PO today Daily PT / INR Stop ASA when INR >/= 2.5  [employer], Pharm.D., BCPS Clinical Pharmacist  **Pharmacist phone directory can be found on [email or web address] listed under [facility].  01/29/2023 12:00 PM

## 2023-01-29 NOTE — PMR Pre-admission (Signed)
PMR Admission Coordinator Pre-Admission Assessment  Patient: Mary [name] is an 72 y.o., female MRN: [id number] DOB: 07-04-1951 Height: 5\' 4"  (162.6 cm) Weight: 51.1 kg              Insurance Information HMO:     PPO: yes     PCP:      IPA:      80/20:      OTHER:  PRIMARY: [employer]      Policy#: [id number]      Subscriber: pt CM Name: Tommi Rumps      Phone#: [phone] ext [phone]     Fax#: [phone] Pre-Cert#: [id number]      Employer:  Benefits:  Phone #: [phone]     Name: 5/31 Eff. Date: 08/31/21     Deduct: $3500      Out of Pocket Max: $8300 CIR: $420 co pay per day days 1 until 5      SNF: no co pay per day days 1 until 20; $203 copay per day days 21 until 100 Outpatient: $25 per visit     Co-Pay: visits per medical neccesity Home Health: 100%      Co-Pay: visits per medical neccesity DME: 100%     Co-Pay: none Providers: in network  SECONDARY: none   Financial Counselor:       Phone#:    The Data processing manager" for patients in Inpatient Rehabilitation Facilities with attached "Privacy Act Statement-Health Care Records" was provided and verbally reviewed with: Patient and Family  Emergency Contact Information Contact Information     Name Relation Home Work Mobile   [place] D Spouse [phone]     Jim Like   [phone]      Current Medical History  Patient Admitting Diagnosis: CVA  History of Present Illness:  72 y.o. female with a history of PAD, CAD, on chronic coumadin who presented 5/29 with left hemiparesis and left inattention.    CTH negative but CTA demonstrated right carotid mural thrombus with high grade stenosis. Pt not TNK candidate because of coumadin.(INR 2.4)  Right ICA thrombectomy performed by NIR. MRI performed demonstrated scattered infarcts throughout the right cerebral hemisphere with a focus in the right frontal and parietal cortex, watershed territory, right caudate and lentiform nucleus, and  right occipital lobe. Pt was placed on full dose ASA and warfarin and INR goal increased to 2.5-3.5. ASA can be discontinued when INR at goal. Smoking cessation recommended.  Left visual-spatial awareness is a barrier. Pt following for dysphagia and cognition. Pt advanced to regular diet .   IR to follow as patient in need of Right ICA stenting. They recommend on day of discharge from rehab to discharge to acute hospital for stent placement.     Complete NIHSS TOTAL: 6   Patient's medical record from [facility] has been reviewed by the rehabilitation admission coordinator and physician.  Past Medical History  Past Medical History:  Diagnosis Date   Arthritis    Hyperlipidemia    Hypertension    Hyperuricemia    Osteoporosis    PAD (peripheral artery disease) (HCC) 02/2010   angiplasty, popliteal artery   Tobacco abuse    Has the patient had major surgery during 100 days prior to admission? Yes  Family History  family history includes Arthritis in her mother, sister, and sister; COPD in her sister; Cancer in her brother, brother, father, and sister; Dementia in her mother; Diabetes in her brother, brother, mother, paternal grandmother, sister, and  sister; Healthy in her brother; Hypertension in her mother and sister; Seizures in her sister; Stroke in her brother, brother, maternal grandmother, and mother.   Current Medications   Current Facility-Administered Medications:    acetaminophen (TYLENOL) tablet 650 mg, 650 mg, Oral, Q4H PRN, 650 mg at 01/30/23 2054 **OR** acetaminophen (TYLENOL) 160 MG/5ML solution 650 mg, 650 mg, Per Tube, Q4H PRN **OR** acetaminophen (TYLENOL) suppository 650 mg, 650 mg, Rectal, Q4H PRN, Marvel Plan, MD   aspirin chewable tablet 81 mg, 81 mg, Oral, Daily, de [place] Torre, Grady E, NP, 81 mg at 02/02/23 0805   atorvastatin (LIPITOR) tablet 10 mg, 10 mg, Oral, QHS, Marvel Plan, MD, 10 mg at 02/01/23 2108   clopidogrel (PLAVIX) tablet 75 mg, 75 mg, Oral,  Daily, de [place], [place] E, NP, 75 mg at 02/02/23 0805   labetalol (NORMODYNE) injection 10-20 mg, 10-20 mg, Intravenous, Q2H PRN, Marvel Plan, MD   nicotine (NICODERM CQ - dosed in mg/24 hr) patch 7 mg, 7 mg, Transdermal, Daily, Marvel Plan, MD, 7 mg at 02/02/23 0805   Oral care mouth rinse, 15 mL, Mouth Rinse, PRN, Marvel Plan, MD   senna-docusate (Senokot-S) tablet 1 tablet, 1 tablet, Oral, QHS PRN, Marvel Plan, MD, 1 tablet at 01/31/23 2052  Patients Current Diet:  Diet Order             Diet regular Room service appropriate? No; Fluid consistency: Thin  Diet effective now                  Precautions / Restrictions Precautions Precautions: Fall, Other (comment) Precaution Comments: L inattention Restrictions Weight Bearing Restrictions: No   Has the patient had 2 or more falls or a fall with injury in the past year?No  Prior Activity Level Community (5-7x/wk): Indepedent, retired, no AD; worked for [employer] for 32 years  Prior Functional Level Prior Function Prior Level of Function : Independent/Modified Independent, Driving Mobility Comments: No AD ADLs Comments: Indep in ADL and IADL  Self Care: Did the patient need help bathing, dressing, using the toilet or eating?  Independent  Indoor Mobility: Did the patient need assistance with walking from room to room (with or without device)? Independent  Stairs: Did the patient need assistance with internal or external stairs (with or without device)? Independent  Functional Cognition: Did the patient need help planning regular tasks such as shopping or remembering to take medications? Independent  Patient Information Are you of Hispanic, Latino/a,or Spanish origin?: A. No, not of Hispanic, Latino/a, or Spanish origin What is your race?: B. Black or African American Do you need or want an interpreter to communicate with a doctor or health care staff?: 0. No  Patient's Response To:  Health Literacy and  Transportation Is the patient able to respond to health literacy and transportation needs?: Yes Health Literacy - How often do you need to have someone help you when you read instructions, pamphlets, or other written material from your doctor or pharmacy?: Never In the past 12 months, has lack of transportation kept you from medical appointments or from getting medications?: No In the past 12 months, has lack of transportation kept you from meetings, work, or from getting things needed for daily living?: No  Home Assistive Devices / Equipment Home Assistive Devices/Equipment: Environmental consultant (specify type), Cane (specify quad or straight) Home Equipment: Rolling Walker (2 wheels)  Prior Device Use: Indicate devices/aids used by the patient prior to current illness, exacerbation or injury? None of the above  Current Functional Level Cognition  Arousal/Alertness: Awake/alert Overall Cognitive Status: Impaired/Different from baseline Current Attention Level: Selective Orientation Level: Oriented X4 Following Commands: Follows one step commands with increased time Safety/Judgement: Decreased awareness of deficits, Decreased awareness of safety General Comments: pleasant, appropriate in conversation and follows commands consistently. increased cueing needed due to L inattention ( to scan, awareness of what certain ADL bottles were on sink). question some memory deficits as pt reported having pancakes for lunch but unsure what she had for breakfast Attention: Focused, Sustained Focused Attention: Appears intact Sustained Attention: Appears intact Memory: Impaired Memory Impairment: Decreased short term memory Awareness: Impaired Problem Solving: Impaired Problem Solving Impairment: Verbal basic Executive Function: Reasoning Reasoning: Appears intact    Extremity Assessment (includes Sensation/Coordination)  Upper Extremity Assessment: LUE deficits/detail LUE Deficits / Details: good grip strength  >R, but gross motor 4-/5; decr coordination, attention to L side LUE Coordination: decreased fine motor, decreased gross motor  Lower Extremity Assessment: Defer to PT evaluation LLE Deficits / Details: at least 3/5 hip flexion, knee flexion/extension, DF/PF, hip abd/add. Formal MMT limited, echo arrived to room LLE Coordination: decreased gross motor, decreased fine motor    ADLs  Overall ADL's : Needs assistance/impaired Eating/Feeding: Set up, Sitting Grooming: Minimal assistance, Standing, Applying deodorant, Wash/dry face, Wash/dry hands Grooming Details (indicate cue type and reason): perseverating some with washing hands; cueing needed to locate body wash as pt attempting to place lotion on washcloth in sink. Cues needed to locate sink then scan to L side to find deodorant - pt looking more on R side for items Upper Body Bathing: Minimal assistance, Standing Upper Body Bathing Details (indicate cue type and reason): washing underarms in standing, assist with clothing and problem solving ability to reach Lower Body Bathing: Min guard, Sit to/from stand Lower Body Bathing Details (indicate cue type and reason): able to bathe peri region in standing without assist Upper Body Dressing : Minimal assistance Lower Body Dressing: Min guard, Sit to/from stand, Sitting/lateral leans Lower Body Dressing Details (indicate cue type and reason): sock mgmt Toilet Transfer: Moderate assistance, +2 for physical assistance, +2 for safety/equipment, Stand-pivot, Ambulation Toilet Transfer Details (indicate cue type and reason): 2 person HHA; LLE incoordination stepping to [facility] Toileting- Clothing Manipulation and Hygiene: Sit to/from stand, Moderate assistance Toileting - Clothing Manipulation Details (indicate cue type and reason): for STS; pt able to lean forward in standing and wipe self. Functional mobility during ADLs: Minimal assistance, Rolling walker (2 wheels) General ADL Comments: limited by L  inattention, focus on scanning sink for ADL items with use of sink as anchor    Mobility  Overal bed mobility: Modified Independent Bed Mobility: Sit to Supine Supine to sit: Min guard Sit to supine: Modified independent (Device/Increase time) General bed mobility comments: Increased time for BLE navigation to EOB.    Transfers  Overall transfer level: Needs assistance Equipment used: Rolling walker (2 wheels) Transfers: Sit to/from Stand Sit to Stand: Min guard Bed to/from chair/wheelchair/BSC transfer type:: Stand pivot Stand pivot transfers: Mod assist General transfer comment: Slow to rise, increased time for anterior weight shift    Ambulation / Gait / Stairs / Wheelchair Mobility  Ambulation/Gait Ambulation/Gait assistance: Min guard, Min assist Gait Distance (Feet): 120 Feet Assistive device: Rolling walker (2 wheels) Gait Pattern/deviations: Step-through pattern, Decreased stride length, Narrow base of support, Drifts right/left (L drift) General Gait Details: Pt with slow pace using RW, L drift corrected through verbal cueing. Infrequent events of steps crossing midline. Gait velocity:  decreased Gait velocity interpretation: <1.8 ft/sec, indicate of risk for recurrent falls Pre-gait activities: 2x HHA dynamic weight shifting: 3 colored notes on the floor to which pt was asked to toe tap with their LLE, all notes positioned past L midline to facilitate L attention. Sidestepping EOB. Stairs: Yes Stairs assistance: Min guard Stair Management: One rail Left, One rail Right Number of Stairs: 5 General stair comments: Cues for attending to where LUE is on railing (tendency to leave it behind her)    Posture / Balance Dynamic Sitting Balance Sitting balance - Comments: Reaches outside limits of anterior stabiliy to grasp RW. Balance Overall balance assessment: Needs assistance, History of Falls Sitting-balance support: No upper extremity supported, Feet supported Sitting  balance-Leahy Scale: Good Sitting balance - Comments: Reaches outside limits of anterior stabiliy to grasp RW. Standing balance support: During functional activity, Bilateral upper extremity supported Standing balance-Leahy Scale: Fair Standing balance comment: reliant on external support    Special needs/care consideration For right ICA stent upon discharge from CIR     Previous Home Environment  Living Arrangements: Spouse/significant other  Lives With: Spouse Available Help at Discharge: Family, Available 24 hours/day Type of Home: House Home Layout: One level Home Access: Stairs to enter Entrance Stairs-Rails: Left, Right Entrance Stairs-Number of Steps: 3 Bathroom Shower/Tub: Associate Professor: Yes Home Care Services: No  Discharge Living Setting Plans for Discharge Living Setting: Patient's home, Lives with (comment) (spouse) Type of Home at Discharge: House Discharge Home Layout: One level Discharge Home Access: Stairs to enter Entrance Stairs-Rails: Right, Left Entrance Stairs-Number of Steps: 3 Discharge Bathroom Shower/Tub: Tub/shower unit Discharge Bathroom Toilet: Standard Discharge Bathroom Accessibility: Yes How Accessible: Accessible via walker Does the patient have any problems obtaining your medications?: No  Social/Family/Support Systems Patient Roles: Spouse Contact Information: spouse, Joien Anticipated Caregiver: spouse Anticipated Industrial/product designer Information: see contacts Ability/Limitations of Caregiver: none Caregiver Availability: 24/7 Discharge Plan Discussed with Primary Caregiver: Yes Is Caregiver In Agreement with Plan?: Yes Does Caregiver/Family have Issues with Lodging/Transportation while Pt is in Rehab?: No  Goals Patient/Family Goal for Rehab: supervision with PT, OT and SLP Expected length of stay: ELOS 11 to 15 days Pt/Family Agrees to Admission and willing to participate:  Yes Program Orientation Provided & Reviewed with Pt/Caregiver Including Roles  & Responsibilities: Yes  Decrease burden of Care through IP rehab admission: n/a  Possible need for SNF placement upon discharge:not anticipated  Patient Condition: This patient's medical and functional status has changed since the consult dated: 01/29/23 in which the Rehabilitation Physician determined and documented that the patient's condition is appropriate for intensive rehabilitative care in an inpatient rehabilitation facility. See "History of Present Illness" (above) for medical update. Functional changes are: min assist overall. Patient's medical and functional status update has been discussed with the Rehabilitation physician and patient remains appropriate for inpatient rehabilitation. Will admit to inpatient rehab today.  Preadmission Screen Completed By:  Clois Dupes, RN MSN 02/02/2023 10:40 AM ______________________________________________________________________   Discussed status with Dr. Berline Chough on 02/02/23 at 1040 and received approval for admission today.  Admission Coordinator:  Clois Dupes RN MSN time [id number] Date 02/02/23

## 2023-01-29 NOTE — Care Management Important Message (Signed)
Important Message  Patient Details  Name: DENICA KAPLON MRN: [id number] Date of Birth: 11/02/1950   Medicare Important Message Given:  Yes     Brewer Hitchman Stefan Church 01/29/2023, 3:04 PM

## 2023-01-29 NOTE — Consult Note (Signed)
Physical Medicine and Rehabilitation Consult Reason for Consult:left-sided weakness, functional deficits  Referring Physician: Roda Shutters   HPI: Mary [name] is a 72 y.o. female with a history of PAD, CAD, on chronic coumadin who presented 5/29 with left hemiparesis and left inattention. CTH negative but CTA demonstrated right carotid mural thrombus with high grade stenosis. Pt not TNK candidate because of coumadin.(INR 2.4)  Right ICA thrombectomy performed by NIR. MRI performed demonstrated scattered infarcts throughout the right cerebral hemisphere with a focus in the right frontal and parietal cortex, watershed territory, right caudate and lentiform nucleus, and right occipital lobe. Pt was placed on full dose ASA and INR goal increased to 2.5-3.5. Smoking cessation recommended. Pt was up yesterday with therapy and was mod assist for sit-std transfers and walked 5' with 2 person hand held assist. Left visual-spatial awareness is a barrier. Pt following for dysphagia and cognition. Pt advanced to regular diet yesterday afternoon. Prior to admit pt was independent and driving. She lives in a 1 level home, 3 STE. Spouse is available to assist.     Review of Systems  Constitutional:  Negative for chills and fever.  HENT: Negative.    Eyes: Negative.   Respiratory: Negative.    Cardiovascular: Negative.   Gastrointestinal: Negative.   Genitourinary: Negative.   Skin:  Negative for rash.  Neurological:  Positive for focal weakness. Negative for headaches.  Psychiatric/Behavioral:  Negative for suicidal ideas.    Past Medical History:  Diagnosis Date   Arthritis    Hyperlipidemia    Hypertension    Hyperuricemia    Osteoporosis    PAD (peripheral artery disease) (HCC) 02/2010   angiplasty, popliteal artery   Tobacco abuse    Past Surgical History:  Procedure Laterality Date   ANGIOPLASTY  July 2011   PAD- popliteal artery   COLONOSCOPY WITH PROPOFOL N/A 01/13/2018   Procedure:  COLONOSCOPY WITH PROPOFOL;  Surgeon: Toney Reil, MD;  Location: [facility] ENDOSCOPY;  Service: Gastroenterology;  Laterality: N/A;   COLONOSCOPY WITH PROPOFOL N/A 01/14/2018   Procedure: COLONOSCOPY WITH PROPOFOL;  Surgeon: Pasty Spillers, MD;  Location: [facility] ENDOSCOPY;  Service: Endoscopy;  Laterality: N/A;   COLONOSCOPY WITH PROPOFOL N/A 07/06/2018   Procedure: COLONOSCOPY WITH PROPOFOL;  Surgeon: Pasty Spillers, MD;  Location: [facility] ENDOSCOPY;  Service: Endoscopy;  Laterality: N/A;   CORONARY ANGIOPLASTY     IR CT HEAD LTD  01/27/2023   IR PERCUTANEOUS ART THROMBECTOMY/INFUSION INTRACRANIAL INC DIAG ANGIO  01/27/2023   JOINT REPLACEMENT     Leg Bypass Surgery Right 08/2010   OVARIAN CYST SURGERY  1980s   POPLITEAL ARTERY STENT Right 05/2010   POPLITEAL ARTERY STENT Right 07/2010   RADIOLOGY WITH ANESTHESIA N/A 01/27/2023   Procedure: IR WITH ANESTHESIA;  Surgeon: Radiologist, Medication, MD;  Location: [facility] OR;  Service: Radiology;  Laterality: N/A;   TOTAL HIP ARTHROPLASTY Left 10/27/2017   Procedure: TOTAL HIP ARTHROPLASTY;  Surgeon: Deeann Saint, MD;  Location: [facility] ORS;  Service: Orthopedics;  Laterality: Left;   Family History  Problem Relation Age of Onset   Arthritis Mother    Diabetes Mother    Stroke Mother    Hypertension Mother    Dementia Mother    Cancer Father    Arthritis Sister    Seizures Sister    Diabetes Sister    Hypertension Sister    COPD Sister    Cancer Brother    Stroke Maternal Grandmother    Diabetes  Paternal Grandmother    Cancer Sister    Diabetes Sister    Arthritis Sister    Diabetes Brother    Stroke Brother    Healthy Brother    Diabetes Brother    Stroke Brother    Cancer Brother    Heart disease Neg Hx    Breast cancer Neg Hx    Social History:  reports that she has been smoking cigarettes. She has a 20.00 pack-year smoking history. She has never used smokeless tobacco. She reports that she does not drink alcohol and does not  use drugs. Allergies: No Known Allergies Medications Prior to Admission  Medication Sig Dispense Refill   amLODipine (NORVASC) 5 MG tablet TAKE 1 TABLET EVERY DAY 90 tablet 1   atorvastatin (LIPITOR) 10 MG tablet TAKE 1 TABLET AT BEDTIME 90 tablet 0   cholecalciferol (VITAMIN D3) 25 MCG (1000 UNIT) tablet Take 2 tablets (2,000 Units total) by mouth daily.     vitamin E 400 UNIT capsule Take 180 Units by mouth daily.      warfarin (COUMADIN) 3 MG tablet Take 1 tablet (3 mg) by mouth one day a week, other 6 days take 5 mg tab poqd 15 tablet 0   warfarin (COUMADIN) 5 MG tablet Take 1 tab (5 mg) po daily, 6 days a week, and one day a week take 3 mg tab 60 tablet 0   acetaminophen (TYLENOL) 500 MG tablet Take 500 mg by mouth 2 (two) times daily as needed for moderate pain or headache.     nicotine (NICODERM CQ - DOSED IN MG/24 HR) 7 mg/24hr patch Place 1 patch (7 mg total) onto the skin daily. (Patient not taking: Reported on 01/27/2023) 28 patch 2    Home: Home Living Family/patient expects to be discharged to:: Private residence Living Arrangements: Spouse/significant other Available Help at Discharge: Family Type of Home: House Home Access: Stairs to enter Secretary/administrator of Steps: 3 Entrance Stairs-Rails: Left, Right Home Layout: One level Bathroom Shower/Tub: Engineer, manufacturing systems: Standard Bathroom Accessibility: Yes Home Equipment: Agricultural consultant (2 wheels)  Lives With: Spouse  Functional History: Prior Function Prior Level of Function : Independent/Modified Independent, Driving Mobility Comments: No AD ADLs Comments: Indep in ADL and IADL Functional Status:  Mobility: Bed Mobility Overal bed mobility: Needs Assistance Bed Mobility: Supine to Sit, Sit to Supine Supine to sit: Min assist, +2 for physical assistance, HOB elevated Sit to supine: Min assist, HOB elevated General bed mobility comments: assist for trunk and LE management, pt using RUE to pull self  up Transfers Overall transfer level: Needs assistance Equipment used: 2 person hand held assist Transfers: Sit to/from Stand, Bed to chair/wheelchair/BSC Sit to Stand: Mod assist, +2 physical assistance Bed to/from chair/wheelchair/BSC transfer type:: Stand pivot Stand pivot transfers: Mod assist, +2 physical assistance General transfer comment: mod +2 for power up, rise, steady, and pivot to/from [facility]. LLE incoordination with stepping to/from [facility] Ambulation/Gait Ambulation/Gait assistance: Min assist, +2 physical assistance Gait Distance (Feet): 5 Feet Assistive device: 2 person hand held assist Gait Pattern/deviations: Step-through pattern, Decreased stride length, Trunk flexed General Gait Details: assist to steady, guide trajectory, inconsistent placement of LLE Gait velocity: decr    ADL: ADL Overall ADL's : Needs assistance/impaired Eating/Feeding: Set up, Sitting Grooming: Minimal assistance Upper Body Bathing: Minimal assistance Lower Body Bathing: Moderate assistance Upper Body Dressing : Minimal assistance Lower Body Dressing: Moderate assistance Toilet Transfer: Moderate assistance, +2 for physical assistance, +2 for safety/equipment, Stand-pivot, Ambulation Toilet  Transfer Details (indicate cue type and reason): 2 person HHA; LLE incoordination stepping to [facility] Toileting- Clothing Manipulation and Hygiene: Sit to/from stand, Moderate assistance Toileting - Clothing Manipulation Details (indicate cue type and reason): for STS; pt able to lean forward in standing and wipe self. Functional mobility during ADLs: Minimal assistance, Moderate assistance, +2 for physical assistance General ADL Comments: Limited by L inattention, decr coordiantion. Needs further testing of proprioception; suspect decr in LE and possubly UE  Cognition: Cognition Overall Cognitive Status: Impaired/Different from baseline Arousal/Alertness: Awake/alert Orientation Level: Oriented X4 Year:  2024 Month: May Day of Week: Correct Attention: Focused, Sustained Focused Attention: Appears intact Sustained Attention: Appears intact Memory: Impaired Memory Impairment: Decreased short term memory Awareness: Impaired Problem Solving: Impaired Problem Solving Impairment: Verbal basic Executive Function: Reasoning Reasoning: Appears intact Cognition Arousal/Alertness: Awake/alert Behavior During Therapy: WFL for tasks assessed/performed Overall Cognitive Status: Impaired/Different from baseline Area of Impairment: Following commands, Safety/judgement Following Commands: Follows one step commands with increased time Safety/Judgement: Decreased awareness of deficits, Decreased awareness of safety General Comments: L inattention, requires cues for attend to L. follows one-step commands with increased time and cuing  Blood pressure (!) 148/82, pulse 72, temperature 97.8 F (36.6 C), temperature source Oral, resp. rate 18, height 5\' 4"  (1.626 m), weight 51.1 kg, SpO2 97 %. Physical Exam Constitutional:      General: She is not in acute distress. HENT:     Head: Normocephalic.     Right Ear: External ear normal.     Left Ear: External ear normal.     Nose: Nose normal.  Eyes:     Conjunctiva/sclera: Conjunctivae normal.     Pupils: Pupils are equal, round, and reactive to light.  Cardiovascular:     Rate and Rhythm: Normal rate and regular rhythm.  Pulmonary:     Effort: Pulmonary effort is normal.  Abdominal:     General: There is no distension.  Musculoskeletal:        General: No swelling or tenderness.     Cervical back: Normal range of motion.  Skin:    General: Skin is warm.  Neurological:     Mental Status: She is alert.     Comments: Pt is alert. Right gaze preference. Can track to left with verbal cueing. Mild left central 7. Speech soft but fairly clear. RUE and RLE 5/5. LUE 3/5 prox to distal and LLE 3+/5 prox to distal. Sensed LT/pain equally.  No limb ataxia  or cerebellar signs. No abnormal tone appreciated. Toes down  Psychiatric:     Comments: Pt generally flat but cooperative and generally pleasant.      Results for orders placed or performed during the hospital encounter of 01/27/23 (from the past 24 hour(s))  Glucose, capillary     Status: Abnormal   Collection Time: 01/28/23 12:18 PM  Result Value Ref Range   Glucose-Capillary 105 (H) 70 - 99 mg/dL  Protime-INR     Status: Abnormal   Collection Time: 01/29/23  4:18 AM  Result Value Ref Range   Prothrombin Time 23.9 (H) 11.4 - 15.2 seconds   INR 2.1 (H) 0.8 - 1.2   IR PERCUTANEOUS ART THROMBECTOMY/INFUSION INTRACRANIAL INC DIAG ANGIO  Result Date: 01/29/2023 INDICATION: New onset right gaze deviation and left-sided neglect, and left hemiparesis. Near occlusive prominent thrombus in the right internal carotid artery. EXAM: 1. EMERGENT LARGE VESSEL OCCLUSION THROMBOLYSIS (anterior CIRCULATION) COMPARISON:  CT angiogram of the head and neck of Jan 27, 2023. MEDICATIONS: Ancef  2 g IV antibiotic was administered within 1 hour of the procedure. ANESTHESIA/SEDATION: General anesthesia. CONTRAST:  Omnipaque 300 approximately 80 mL. FLUOROSCOPY TIME:  Fluoroscopy Time: 17 minutes 24 seconds (947 mGy). COMPLICATIONS: None immediate. TECHNIQUE: Following a full explanation of the procedure along with the potential associated complications, an informed witnessed consent was obtained. The risks of intracranial hemorrhage of 10%, worsening neurological deficit, ventilator dependency, death and inability to revascularize were all reviewed in detail with the patient. The patient was then put under general anesthesia by the Department of Anesthesiology at [facility]. The right groin was prepped and draped in the usual sterile fashion. Thereafter using modified Seldinger technique, transfemoral access into the right common femoral artery was obtained without difficulty. Over an 0.035 inch guidewire an 8  French 25 cm Arrow sheath was inserted. Through this, and also over an 0.035 inch guidewire a combination of a Berenstein 125 cm 5.5 [place] support catheter inside of an 087 95 cm balloon guide catheter was advanced to the right common carotid artery. The guidewire, and the support catheter were removed. Arteriograms were then performed through the balloon guide catheter extra cranially and intracranially. FINDINGS: The right common carotid bifurcation demonstrates the right external carotid artery and its major branches to be widely patent. The right internal carotid artery at the bulb has a circumferential atherosclerotic plaque associated with an approximately 3 cm irregular filling defect nearly occluding the internal carotid artery. More distally, patency is maintained of the distal cervical right ICA, the petrous, the cavernous and the supraclinoid right ICA. A right posterior communicating artery is seen opacifying the right posterior cerebral artery distribution. The right middle cerebral artery opacifies into the capillary and venous phases. A hypoplastic right A1 segment is noted with continuation into the A2 region. PROCEDURE: Through the balloon guide catheter, an 021 150 cm microcatheter was advanced over an 014 inch soft tip micro guidewire with a moderate J configuration to just proximal to the right internal carotid artery origin. At this time, proximal flow arrest was initiated by inflating the balloon guide catheter in the distal right common carotid artery. Also a combination of the micro guidewire and microcatheter was advanced through the internal carotid artery, slight proximal aspiration was applied with a 20 mL syringe as the combination was navigated to the petrous segment of the right ICA. The aspiration was then stopped. The micro guidewire was removed. A 6.5 mm x 47 mm Embotrap retrieved retrieval device was then advanced to the distal end of the microcatheter and deployed such that the  proximal portion of the device was in the distal cervical right ICA. The microcatheter was removed. With the proximal flow arrest intact, proximal aspiration was then performed at the hub of the balloon guide catheter with a Penumbra aspiration device, as the retriever was removed. An 071 Zoom aspiration catheter was then advanced over an 014 microcatheter as aspiration was performed. The 071 Zoom aspiration catheter was advanced into the right internal carotid artery proximally and distally and then distal to proximal flow arrest still intact. Thick irregular grayish white clot fragments were retained in the retriever, and in the canister. Following reversal of flow arrest in the right common carotid artery, a control arteriogram performed through the balloon guide catheter revealed no residual filling defects in the right internal carotid artery extra cranially and intracranially. Right MCA remained widely patent without evidence of intraluminal filling defects, or of occlusions. The non dominant right A1 and A2 segments also demonstrated no  changes. The balloon guide wire was removed. Manual compression was applied with quick clot for 25 minutes to obtain hemostasis at the right groin puncture site. Distal pulses remained Dopplerable in both feet unchanged at the end of procedure. A flat panel CT of the brain demonstrated no evidence of intracranial hemorrhage. The patient's general anesthesia was then reversed and the patient was extubated. Upon recovery, the patient was able to obey simple commands though had no movement in her left arm and leg. She was then transferred to the neuro ICU for post revascularization care. IMPRESSION: Status post right ICA extra cranially with 1 pass with a 6.5 mm x 47 mm Emboshield retrieval device and aspiration achieving complete revascularization. Approximately 65% stenosis of the right internal carotid artery at the bulb. PLAN: We will probably need placement of a stent at the  right internal carotid artery proximally to prevent future ischemic strokes in the right MCA distribution. Electronically Signed   By: Julieanne Cotton M.D.   On: 01/29/2023 08:42   IR CT Head Ltd  Result Date: 01/29/2023 INDICATION: New onset right gaze deviation and left-sided neglect, and left hemiparesis. Near occlusive prominent thrombus in the right internal carotid artery. EXAM: 1. EMERGENT LARGE VESSEL OCCLUSION THROMBOLYSIS (anterior CIRCULATION) COMPARISON:  CT angiogram of the head and neck of Jan 27, 2023. MEDICATIONS: Ancef 2 g IV antibiotic was administered within 1 hour of the procedure. ANESTHESIA/SEDATION: General anesthesia. CONTRAST:  Omnipaque 300 approximately 80 mL. FLUOROSCOPY TIME:  Fluoroscopy Time: 17 minutes 24 seconds (947 mGy). COMPLICATIONS: None immediate. TECHNIQUE: Following a full explanation of the procedure along with the potential associated complications, an informed witnessed consent was obtained. The risks of intracranial hemorrhage of 10%, worsening neurological deficit, ventilator dependency, death and inability to revascularize were all reviewed in detail with the patient. The patient was then put under general anesthesia by the Department of Anesthesiology at [facility]. The right groin was prepped and draped in the usual sterile fashion. Thereafter using modified Seldinger technique, transfemoral access into the right common femoral artery was obtained without difficulty. Over an 0.035 inch guidewire an 8 French 25 cm Arrow sheath was inserted. Through this, and also over an 0.035 inch guidewire a combination of a Berenstein 125 cm 5.5 [place] support catheter inside of an 087 95 cm balloon guide catheter was advanced to the right common carotid artery. The guidewire, and the support catheter were removed. Arteriograms were then performed through the balloon guide catheter extra cranially and intracranially. FINDINGS: The right common carotid bifurcation  demonstrates the right external carotid artery and its major branches to be widely patent. The right internal carotid artery at the bulb has a circumferential atherosclerotic plaque associated with an approximately 3 cm irregular filling defect nearly occluding the internal carotid artery. More distally, patency is maintained of the distal cervical right ICA, the petrous, the cavernous and the supraclinoid right ICA. A right posterior communicating artery is seen opacifying the right posterior cerebral artery distribution. The right middle cerebral artery opacifies into the capillary and venous phases. A hypoplastic right A1 segment is noted with continuation into the A2 region. PROCEDURE: Through the balloon guide catheter, an 021 150 cm microcatheter was advanced over an 014 inch soft tip micro guidewire with a moderate J configuration to just proximal to the right internal carotid artery origin. At this time, proximal flow arrest was initiated by inflating the balloon guide catheter in the distal right common carotid artery. Also a combination of the micro  guidewire and microcatheter was advanced through the internal carotid artery, slight proximal aspiration was applied with a 20 mL syringe as the combination was navigated to the petrous segment of the right ICA. The aspiration was then stopped. The micro guidewire was removed. A 6.5 mm x 47 mm Embotrap retrieved retrieval device was then advanced to the distal end of the microcatheter and deployed such that the proximal portion of the device was in the distal cervical right ICA. The microcatheter was removed. With the proximal flow arrest intact, proximal aspiration was then performed at the hub of the balloon guide catheter with a Penumbra aspiration device, as the retriever was removed. An 071 Zoom aspiration catheter was then advanced over an 014 microcatheter as aspiration was performed. The 071 Zoom aspiration catheter was advanced into the right internal  carotid artery proximally and distally and then distal to proximal flow arrest still intact. Thick irregular grayish white clot fragments were retained in the retriever, and in the canister. Following reversal of flow arrest in the right common carotid artery, a control arteriogram performed through the balloon guide catheter revealed no residual filling defects in the right internal carotid artery extra cranially and intracranially. Right MCA remained widely patent without evidence of intraluminal filling defects, or of occlusions. The non dominant right A1 and A2 segments also demonstrated no changes. The balloon guide wire was removed. Manual compression was applied with quick clot for 25 minutes to obtain hemostasis at the right groin puncture site. Distal pulses remained Dopplerable in both feet unchanged at the end of procedure. A flat panel CT of the brain demonstrated no evidence of intracranial hemorrhage. The patient's general anesthesia was then reversed and the patient was extubated. Upon recovery, the patient was able to obey simple commands though had no movement in her left arm and leg. She was then transferred to the neuro ICU for post revascularization care. IMPRESSION: Status post right ICA extra cranially with 1 pass with a 6.5 mm x 47 mm Emboshield retrieval device and aspiration achieving complete revascularization. Approximately 65% stenosis of the right internal carotid artery at the bulb. PLAN: We will probably need placement of a stent at the right internal carotid artery proximally to prevent future ischemic strokes in the right MCA distribution. Electronically Signed   By: Julieanne Cotton M.D.   On: 01/29/2023 08:42   ECHOCARDIOGRAM COMPLETE  Result Date: 01/28/2023    ECHOCARDIOGRAM REPORT   Patient Name:   SHIVANGI BODDICKER Date of Exam: 01/28/2023 Medical Rec #:  [id number]     Height:       64.0 in Accession #:    [id number]    Weight:       112.7 lb Date of Birth:  09-23-1950      BSA:           1.533 m Patient Age:    24 years      BP:           124/71 mmHg Patient Gender: F             HR:           68 bpm. Exam Location:  Inpatient Procedure: 2D Echo, Color Doppler and Cardiac Doppler Indications:    Stroke  History:        Patient has prior history of Echocardiogram examinations and                 Patient has no prior history of Echocardiogram examinations.  Right ICA occlusion, PAD, Smoker, HTN, HLD, CAD.  Sonographer:    Milbert Coulter Referring Phys: [id number] JINDONG XU IMPRESSIONS  1. Left ventricular ejection fraction, by estimation, is 55 to 60%. The left ventricle has normal function. The left ventricle has no regional wall motion abnormalities. Left ventricular diastolic parameters are consistent with Grade I diastolic dysfunction (impaired relaxation).  2. Right ventricular systolic function is normal. The right ventricular size is normal. Tricuspid regurgitation signal is inadequate for assessing PA pressure.  3. The mitral valve is grossly normal. Trivial mitral valve regurgitation.  4. The aortic valve is tricuspid. There is mild calcification of the aortic valve. There is mild thickening of the aortic valve. Aortic valve regurgitation is not visualized. Aortic valve sclerosis/calcification is present, without any evidence of aortic stenosis.  5. The inferior vena cava is normal in size with greater than 50% respiratory variability, suggesting right atrial pressure of 3 mmHg. Comparison(s): No prior Echocardiogram. Conclusion(s)/Recommendation(s): No intracardiac source of embolism detected on this transthoracic study. Consider a transesophageal echocardiogram to exclude cardiac source of embolism if clinically indicated. FINDINGS  Left Ventricle: Left ventricular ejection fraction, by estimation, is 55 to 60%. The left ventricle has normal function. The left ventricle has no regional wall motion abnormalities. The left ventricular internal cavity size was normal in  size. There is  no left ventricular hypertrophy. Left ventricular diastolic parameters are consistent with Grade I diastolic dysfunction (impaired relaxation). Right Ventricle: The right ventricular size is normal. No increase in right ventricular wall thickness. Right ventricular systolic function is normal. Tricuspid regurgitation signal is inadequate for assessing PA pressure. Left Atrium: Left atrial size was normal in size. Right Atrium: Right atrial size was normal in size. Pericardium: There is no evidence of pericardial effusion. Mitral Valve: The mitral valve is grossly normal. Trivial mitral valve regurgitation. Tricuspid Valve: The tricuspid valve is normal in structure. Tricuspid valve regurgitation is trivial. Aortic Valve: The aortic valve is tricuspid. There is mild calcification of the aortic valve. There is mild thickening of the aortic valve. Aortic valve regurgitation is not visualized. Aortic valve sclerosis/calcification is present, without any evidence of aortic stenosis. Aortic valve mean gradient measures 4.0 mmHg. Aortic valve peak gradient measures 7.3 mmHg. Aortic valve area, by VTI measures 1.23 cm. Pulmonic Valve: The pulmonic valve was not well visualized. Pulmonic valve regurgitation is trivial. Aorta: The aortic root is normal in size and structure. Venous: The inferior vena cava is normal in size with greater than 50% respiratory variability, suggesting right atrial pressure of 3 mmHg. IAS/Shunts: The atrial septum is grossly normal.  LEFT VENTRICLE PLAX 2D LVIDd:         3.60 cm     Diastology LVIDs:         2.50 cm     LV e' medial:    9.68 cm/s LV PW:         1.10 cm     LV E/e' medial:  8.9 LV IVS:        1.00 cm     LV e' lateral:   12.70 cm/s LVOT diam:     1.70 cm     LV E/e' lateral: 6.7 LV SV:         41 LV SV Index:   27 LVOT Area:     2.27 cm  LV Volumes (MOD) LV vol d, MOD A2C: 38.2 ml LV vol d, MOD A4C: 42.6 ml LV vol s, MOD A2C: 10.4 ml LV vol s,  MOD A4C: 16.1 ml LV  SV MOD A2C:     27.8 ml LV SV MOD A4C:     42.6 ml LV SV MOD BP:      28.3 ml RIGHT VENTRICLE RV Basal diam:  2.70 cm RV Mid diam:    2.30 cm RV S prime:     11.70 cm/s TAPSE (M-mode): 2.3 cm LEFT ATRIUM             Index        RIGHT ATRIUM           Index LA diam:        2.50 cm 1.63 cm/m   RA Area:     12.10 cm LA Vol (A2C):   34.1 ml 22.25 ml/m  RA Volume:   27.70 ml  18.07 ml/m LA Vol (A4C):   27.2 ml 17.74 ml/m LA Biplane Vol: 31.1 ml 20.29 ml/m  AORTIC VALVE AV Area (Vmax):    1.59 cm AV Area (Vmean):   1.42 cm AV Area (VTI):     1.23 cm AV Vmax:           135.00 cm/s AV Vmean:          97.300 cm/s AV VTI:            0.331 m AV Peak Grad:      7.3 mmHg AV Mean Grad:      4.0 mmHg LVOT Vmax:         94.60 cm/s LVOT Vmean:        60.700 cm/s LVOT VTI:          0.179 m LVOT/AV VTI ratio: 0.54  AORTA Ao Root diam: 3.10 cm Ao Asc diam:  2.50 cm MITRAL VALVE MV Area (PHT): 4.06 cm    SHUNTS MV Decel Time: 187 msec    Systemic VTI:  0.18 m MV E velocity: 85.70 cm/s  Systemic Diam: 1.70 cm MV A velocity: 85.70 cm/s MV E/A ratio:  1.00 Laurance Flatten MD Electronically signed by Laurance Flatten MD Signature Date/Time: 01/28/2023/3:48:06 PM    Final    VAS US CAROTID  Result Date: 01/28/2023 Carotid Arterial Duplex Study Patient Name:  TYNESIA HARTLIEB  Date of Exam:   01/28/2023 Medical Rec #: [id number]      Accession #:    [id number] Date of Birth: 1951/02/22       Patient Gender: F Patient Age:   30 years Exam Location:  [facility] Procedure:      VAS US CAROTID Referring Phys: Scheryl Marten XU --------------------------------------------------------------------------------  Indications:       CVA, Weakness and right carotid mural thrombus, status post                    thrombectomy with residual 55-60% ICA stenosis. Risk Factors:      Hypertension, hyperlipidemia, current smoker, coronary artery                    disease, PAD. Other Factors:     On Coumadin for clotting disorder. Comparison Study:   No prior study Performing Technologist: Sherren Kerns RVS  Examination Guidelines: A complete evaluation includes B-mode imaging, spectral Doppler, color Doppler, and power Doppler as needed of all accessible portions of each vessel. Bilateral testing is considered an integral part of a complete examination. Limited examinations for reoccurring indications may be performed as noted.  Right Carotid Findings: +----------+--------+--------+--------+------------------+------------------+  PSV cm/sEDV cm/sStenosisPlaque DescriptionComments           +----------+--------+--------+--------+------------------+------------------+ CCA Prox  89      17                                intimal thickening +----------+--------+--------+--------+------------------+------------------+ CCA Mid                           focal and calcific                   +----------+--------+--------+--------+------------------+------------------+ CCA Distal82      17                                intimal thickening +----------+--------+--------+--------+------------------+------------------+ ICA Prox  77      20      1-39%   calcific          Shadowing          +----------+--------+--------+--------+------------------+------------------+ ICA Mid   108     20                                                   +----------+--------+--------+--------+------------------+------------------+ ICA Distal91      27                                                   +----------+--------+--------+--------+------------------+------------------+ ECA       117     11                                                   +----------+--------+--------+--------+------------------+------------------+ +----------+--------+-------+--------+-------------------+           PSV cm/sEDV cmsDescribeArm Pressure (mmHG) +----------+--------+-------+--------+-------------------+ Subclavian70                                          +----------+--------+-------+--------+-------------------+ +---------+--------+--+--------+--+ VertebralPSV cm/s70EDV cm/s14 +---------+--------+--+--------+--+  Left Carotid Findings: +----------+--------+--------+--------+------------------+------------------+           PSV cm/sEDV cm/sStenosisPlaque DescriptionComments           +----------+--------+--------+--------+------------------+------------------+ CCA Prox  85      21                                intimal thickening +----------+--------+--------+--------+------------------+------------------+ CCA Distal81      16                                intimal thickening +----------+--------+--------+--------+------------------+------------------+ ICA Prox  82      23      1-39%   calcific                             +----------+--------+--------+--------+------------------+------------------+  ICA Mid   77      21                                                   +----------+--------+--------+--------+------------------+------------------+ ICA Distal75      23                                                   +----------+--------+--------+--------+------------------+------------------+ ECA       77      12                                                   +----------+--------+--------+--------+------------------+------------------+ +----------+--------+--------+--------+-------------------+           PSV cm/sEDV cm/sDescribeArm Pressure (mmHG) +----------+--------+--------+--------+-------------------+ [id number]                                          +----------+--------+--------+--------+-------------------+ +---------+--------+--+--------+--+ VertebralPSV cm/s72EDV cm/s16 +---------+--------+--+--------+--+   Summary: Right Carotid: Velocities in the right ICA are consistent with a 1-39% stenosis. Left Carotid: Velocities in the left ICA are consistent with a  1-39% stenosis. Vertebrals:  Bilateral vertebral arteries demonstrate antegrade flow. Subclavians: Normal flow hemodynamics were seen in bilateral subclavian              arteries. *See table(s) above for measurements and observations.     Preliminary    MR BRAIN WO CONTRAST  Result Date: 01/27/2023 CLINICAL DATA:  Stroke, follow-up EXAM: MRI HEAD WITHOUT CONTRAST TECHNIQUE: Multiplanar, multiecho pulse sequences of the brain and surrounding structures were obtained without intravenous contrast. COMPARISON:  No prior MRI available, correlation is made with CT head 01/27/2023 FINDINGS: Brain: Scattered foci of restricted diffusion with ADC correlate throughout the right cerebral hemisphere, with more focal and contiguous areas of restricted diffusion in the right frontal and parietal cortex (series 5, image 89), watershed territory (series 5, image 85), right caudate and lentiform nucleus (series 5, image 78), and right occipital lobe (series 5, image 68-70). No acute hemorrhage, mass, mass effect, or midline shift. No hydrocephalus or extra-axial collection. No hemosiderin deposition to suggest remote hemorrhage. Scattered and confluent T2 hyperintense signal in the periventricular white matter and pons, likely the sequela of moderate to severe chronic small vessel ischemic disease. Normal cerebral volume for age. Vascular: Normal arterial flow voids. Skull and upper cervical spine: Normal marrow signal. Sinuses/Orbits: Mucosal thickening in the left ethmoid air cells. No acute finding in the orbits. Other: The mastoids are well aerated. IMPRESSION: Scattered acute infarcts throughout the right cerebral hemisphere, with more focal areas of infarction in the right frontal and parietal cortex, watershed territory, right caudate and lentiform nucleus, and right occipital lobe. These results will be called to the ordering clinician or representative by the Radiologist Assistant, and communication documented in the  PACS or [employer]. Electronically Signed   By: Wiliam Ke M.D.   On: 01/27/2023 23:45   CT HEAD CODE STROKE WO CONTRAST  Result Date:  01/27/2023 CLINICAL DATA:  Code stroke.  Left-sided weakness, right-sided gaze. EXAM: CT ANGIOGRAPHY HEAD AND NECK TECHNIQUE: Multidetector CT imaging of the head and neck was performed using the standard protocol during bolus administration of intravenous contrast. Multiplanar CT image reconstructions and MIPs were obtained to evaluate the vascular anatomy. Carotid stenosis measurements (when applicable) are obtained utilizing NASCET criteria, using the distal internal carotid diameter as the denominator. RADIATION DOSE REDUCTION: This exam was performed according to the departmental dose-optimization program which includes automated exposure control, adjustment of the mA and/or kV according to patient size and/or use of iterative reconstruction technique. CONTRAST:  75 cc Omnipaque 350 COMPARISON:  CT head 09/29/2021 FINDINGS: CT HEAD FINDINGS Brain: There is no evidence of acute intracranial hemorrhage, extra-axial fluid collection, or acute territorial infarct. Parenchymal volume is normal. The ventricles are normal in size. Gray-white differentiation is preserved. There is patchy hypodensity in the supratentorial white matter likely reflecting sequela of advanced chronic small-vessel ischemic change. Pituitary and suprasellar region are normal. There is no mass lesion there is no mass effect or midline shift. Vascular: No hyperdense vessel is seen. Skull: Normal. Negative for fracture or focal lesion. Sinuses/Orbits: The paranasal sinuses are clear. The globes and orbits are unremarkable. Other: None. ASPECTS [facility] Stroke Program Early CT Score) - Ganglionic level infarction (caudate, lentiform nuclei, internal capsule, insula, M1-M3 cortex): 7 - Supraganglionic infarction (M4-M6 cortex): 3 Total score (0-10 with 10 being normal): 10 CTA NECK FINDINGS Aortic  arch: There is mild calcified plaque in the imaged aortic arch. The origins of the major branch vessels are patent. The subclavian arteries are patent to the level imaged. Right carotid system: The right common carotid artery is patent. There is mixed plaque of the bifurcation with noncalcified plaque projecting into the lumen resulting in hemodynamically significant stenosis just after the bifurcation, estimated at 60%. Distally, there is additional mural adherent clot in the lumen of the ICA resulting in high-grade stenosis, estimated at 80%. There is no raised dissection flap. The distal internal carotid artery is patent. The external carotid artery is patent. There is no evidence of aneurysm/pseudoaneurysm. Left carotid system: The left common carotid artery is patent. There is calcified plaque at the bifurcation without hemodynamically significant stenosis or occlusion. The internal and external carotid arteries are patent. There is no evidence of dissection or aneurysm/pseudoaneurysm. Vertebral arteries: The vertebral arteries are patent, without hemodynamically significant stenosis or occlusion. There is no evidence of dissection or aneurysm/pseudoaneurysm. Skeleton: There is no acute osseous abnormality or suspicious osseous lesion. There is no visible canal hematoma. Other neck: There is extensive dental disease. The soft tissues of the neck are unremarkable. Upper chest: There is emphysema in the lung apices. Review of the MIP images confirms the above findings CTA HEAD FINDINGS Anterior circulation: There is calcified plaque in the intracranial ICAs without significant stenosis or occlusion. The right M1 segment is patent. The branch vessels are patent, without proximal stenosis or occlusion. The left MCA is patent, without proximal stenosis or occlusion. The bilateral ACAs are patent, without proximal stenosis or occlusion. There is no aneurysm or AVM. Posterior circulation: The bilateral V4 segments are  patent. The basilar artery is patent. The major cerebellar arteries appear patent. The bilateral PCAs are patent, without proximal stenosis or occlusion. There is a fetal origin of the right PCA. A left posterior communicating artery is also identified. There is no aneurysm or AVM. Venous sinuses: Patent. Anatomic variants: As above. Review of the MIP images confirms the  above findings IMPRESSION: 1. No acute intracranial pathology.  No evidence of evolved infarct. 2. Mural adherent clot in the right internal carotid artery after the bifurcation resulting in high-grade stenosis, estimated at 80%. 3. More proximally in the right internal carotid artery just after the bifurcation, mixed plaque results in proximally 60% stenosis. 4. Mild plaque at the left carotid bifurcation without significant stenosis. Patent vertebral arteries in the neck. 5. Patent intracranial vasculature. 6. Emphysema. Findings of the noncontrast head CT were communicated to Dr Roda Shutters at 11:32 am. The the CTA results were discussed at 11:43 am. Electronically Signed   By: Lesia Hausen M.D.   On: 01/27/2023 11:57   CT ANGIO HEAD NECK W WO CM (CODE STROKE)  Result Date: 01/27/2023 CLINICAL DATA:  Code stroke.  Left-sided weakness, right-sided gaze. EXAM: CT ANGIOGRAPHY HEAD AND NECK TECHNIQUE: Multidetector CT imaging of the head and neck was performed using the standard protocol during bolus administration of intravenous contrast. Multiplanar CT image reconstructions and MIPs were obtained to evaluate the vascular anatomy. Carotid stenosis measurements (when applicable) are obtained utilizing NASCET criteria, using the distal internal carotid diameter as the denominator. RADIATION DOSE REDUCTION: This exam was performed according to the departmental dose-optimization program which includes automated exposure control, adjustment of the mA and/or kV according to patient size and/or use of iterative reconstruction technique. CONTRAST:  75 cc  Omnipaque 350 COMPARISON:  CT head 09/29/2021 FINDINGS: CT HEAD FINDINGS Brain: There is no evidence of acute intracranial hemorrhage, extra-axial fluid collection, or acute territorial infarct. Parenchymal volume is normal. The ventricles are normal in size. Gray-white differentiation is preserved. There is patchy hypodensity in the supratentorial white matter likely reflecting sequela of advanced chronic small-vessel ischemic change. Pituitary and suprasellar region are normal. There is no mass lesion there is no mass effect or midline shift. Vascular: No hyperdense vessel is seen. Skull: Normal. Negative for fracture or focal lesion. Sinuses/Orbits: The paranasal sinuses are clear. The globes and orbits are unremarkable. Other: None. ASPECTS [facility] Stroke Program Early CT Score) - Ganglionic level infarction (caudate, lentiform nuclei, internal capsule, insula, M1-M3 cortex): 7 - Supraganglionic infarction (M4-M6 cortex): 3 Total score (0-10 with 10 being normal): 10 CTA NECK FINDINGS Aortic arch: There is mild calcified plaque in the imaged aortic arch. The origins of the major branch vessels are patent. The subclavian arteries are patent to the level imaged. Right carotid system: The right common carotid artery is patent. There is mixed plaque of the bifurcation with noncalcified plaque projecting into the lumen resulting in hemodynamically significant stenosis just after the bifurcation, estimated at 60%. Distally, there is additional mural adherent clot in the lumen of the ICA resulting in high-grade stenosis, estimated at 80%. There is no raised dissection flap. The distal internal carotid artery is patent. The external carotid artery is patent. There is no evidence of aneurysm/pseudoaneurysm. Left carotid system: The left common carotid artery is patent. There is calcified plaque at the bifurcation without hemodynamically significant stenosis or occlusion. The internal and external carotid arteries are  patent. There is no evidence of dissection or aneurysm/pseudoaneurysm. Vertebral arteries: The vertebral arteries are patent, without hemodynamically significant stenosis or occlusion. There is no evidence of dissection or aneurysm/pseudoaneurysm. Skeleton: There is no acute osseous abnormality or suspicious osseous lesion. There is no visible canal hematoma. Other neck: There is extensive dental disease. The soft tissues of the neck are unremarkable. Upper chest: There is emphysema in the lung apices. Review of the MIP images confirms the  above findings CTA HEAD FINDINGS Anterior circulation: There is calcified plaque in the intracranial ICAs without significant stenosis or occlusion. The right M1 segment is patent. The branch vessels are patent, without proximal stenosis or occlusion. The left MCA is patent, without proximal stenosis or occlusion. The bilateral ACAs are patent, without proximal stenosis or occlusion. There is no aneurysm or AVM. Posterior circulation: The bilateral V4 segments are patent. The basilar artery is patent. The major cerebellar arteries appear patent. The bilateral PCAs are patent, without proximal stenosis or occlusion. There is a fetal origin of the right PCA. A left posterior communicating artery is also identified. There is no aneurysm or AVM. Venous sinuses: Patent. Anatomic variants: As above. Review of the MIP images confirms the above findings IMPRESSION: 1. No acute intracranial pathology.  No evidence of evolved infarct. 2. Mural adherent clot in the right internal carotid artery after the bifurcation resulting in high-grade stenosis, estimated at 80%. 3. More proximally in the right internal carotid artery just after the bifurcation, mixed plaque results in proximally 60% stenosis. 4. Mild plaque at the left carotid bifurcation without significant stenosis. Patent vertebral arteries in the neck. 5. Patent intracranial vasculature. 6. Emphysema. Findings of the noncontrast  head CT were communicated to Dr Roda Shutters at 11:32 am. The the CTA results were discussed at 11:43 am. Electronically Signed   By: Lesia Hausen M.D.   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 Pt with ongoing left hemiparesis, visual-spatial deficits, and cognitive deficits.   Does the need for close, 24 hr/day medical supervision in concert with the patient's rehab needs make it unreasonable for this patient to be served in a less intensive setting? Yes Co-Morbidities requiring supervision/potential complications:  -Coagulation disorder -HTN -tobacco abuse Due to bladder management, bowel management, safety, skin/wound care, disease management, medication administration, and patient education, does the patient require 24 hr/day rehab nursing? Yes Does the patient require coordinated care of a physician, rehab nurse, therapy disciplines of PT, OT , SLP to address physical and functional deficits in the context of the above medical diagnosis(es)? Yes Addressing deficits in the following areas: balance, endurance, locomotion, strength, transferring, bowel/bladder control, bathing, dressing, feeding, grooming, toileting, cognition, and psychosocial support Can the patient actively participate in an intensive therapy program of at least 3 hrs of therapy per day at least 5 days per week? Yes The potential for patient to make measurable gains while on inpatient rehab is excellent Anticipated functional outcomes upon discharge from inpatient rehab are supervision  with PT, supervision with OT, modified independent and supervision with SLP. Estimated rehab length of stay to reach the above functional goals is: 11-15 days Anticipated discharge destination: Home Overall Rehab/Functional Prognosis: excellent  POST ACUTE RECOMMENDATIONS: This patient's condition is appropriate for continued rehabilitative  care in the following setting: CIR Patient has agreed to participate in recommended program. Yes Note that insurance prior authorization may be required for reimbursement for recommended care.  Comment: Rehab Admissions Coordinator to follow up     I have personally performed a face to face diagnostic evaluation of this patient. Additionally, I have examined the patient's medical record including any pertinent labs and radiographic images. If the physician assistant has documented in this note, I have reviewed and edited or otherwise concur with the physician assistant's documentation.  Thanks,  Ranelle Oyster, MD 01/29/2023

## 2023-01-30 DIAGNOSIS — I63131 Cerebral infarction due to embolism of right carotid artery: Secondary | ICD-10-CM | POA: Diagnosis not present

## 2023-01-30 LAB — PROTIME-INR
INR: 2.3 — ABNORMAL HIGH (ref 0.8–1.2)
Prothrombin Time: 25.8 seconds — ABNORMAL HIGH (ref 11.4–15.2)

## 2023-01-30 MED ORDER — ORAL CARE MOUTH RINSE: 15.0000 mL | OROMUCOSAL | Status: DC | PRN

## 2023-01-30 MED ORDER — WARFARIN SODIUM 6 MG PO TABS
6.0000 mg | ORAL_TABLET | Freq: Once | ORAL | Status: AC
  Administered 2023-01-30: 6 mg via ORAL
  Filled 2023-01-30: qty 1

## 2023-01-30 NOTE — Progress Notes (Signed)
STROKE TEAM PROGRESS NOTE   SUBJECTIVE (INTERVAL HISTORY) No family is at the bedside.  Eating breakfast. No complaints. No headache. Pharmacy dosing coumadin.   OBJECTIVE Temp:  [98 F (36.7 C)-98.8 F (37.1 C)] 98.1 F (36.7 C) (06/01 1124) Pulse Rate:  [73-90] 73 (06/01 1124) Cardiac Rhythm: Normal sinus rhythm (06/01 0700) Resp:  [16-19] 16 (06/01 1124) BP: (122-152)/(75-82) 122/75 (06/01 1124) SpO2:  [96 %-100 %] 97 % (06/01 1124)  Recent Labs  Lab 01/27/23 1909 01/27/23 2344 01/28/23 0307 01/28/23 0714 01/28/23 1218  GLUCAP 126* 122* 106* 82 105*    Recent Labs  Lab 01/27/23 1126 01/27/23 1128 01/28/23 0250  NA 136 139 134*  K 3.0* 2.9* 3.5  CL 101 99 101  CO2 25  --  23  GLUCOSE 127* 121* 113*  BUN 9 10 8   CREATININE 0.89 0.80 0.75  CALCIUM 9.0  --  8.4*    Recent Labs  Lab 01/27/23 1126  AST 24  ALT 16  ALKPHOS 70  BILITOT 0.4  PROT 7.4  ALBUMIN 3.8    Recent Labs  Lab 01/27/23 1126 01/27/23 1128 01/28/23 0250  WBC 9.5  --  6.5  NEUTROABS 1.8  --  4.4  HGB 13.1 13.9 11.9*  HCT 39.3 41.0 35.0*  MCV 87.9  --  86.2  PLT 243  --  201    No results for input(s): "CKTOTAL", "CKMB", "CKMBINDEX", "TROPONINI" in the last 168 hours. Recent Labs    01/28/23 0250 01/29/23 0418 01/30/23 0249  LABPROT 24.9* 23.9* 25.8*  INR 2.2* 2.1* 2.3*    No results for input(s): "COLORURINE", "LABSPEC", "PHURINE", "GLUCOSEU", "HGBUR", "BILIRUBINUR", "KETONESUR", "PROTEINUR", "UROBILINOGEN", "NITRITE", "LEUKOCYTESUR" in the last 72 hours.  Invalid input(s): "APPERANCEUR"     Component Value Date/Time   CHOL 115 01/28/2023 0250   CHOL 145 10/17/2015 1017   TRIG 57 01/28/2023 0250   HDL 51 01/28/2023 0250   HDL 43 10/17/2015 1017   CHOLHDL 2.3 01/28/2023 0250   VLDL 11 01/28/2023 0250   LDLCALC 53 01/28/2023 0250   LDLCALC 55 10/06/2022 1038   Lab Results  Component Value Date   HGBA1C 6.2 (H) 01/28/2023   No results found for: "LABOPIA",  "COCAINSCRNUR", "LABBENZ", "AMPHETMU", "THCU", "LABBARB"  Recent Labs  Lab 01/27/23 1126  ETH <10     I have personally reviewed the radiological images below and agree with the radiology interpretations.  VAS US CAROTID  Result Date: 01/29/2023 Carotid Arterial Duplex Study Patient Name:  DE MINK  Date of Exam:   01/28/2023 Medical Rec #: [id number]      Accession #:    [id number] Date of Birth: 07-06-1951       Patient Gender: F Patient Age:   72 years Exam Location:  [facility] Procedure:      VAS US CAROTID Referring Phys: Scheryl Marten XU --------------------------------------------------------------------------------  Indications:       CVA, Weakness and right carotid mural thrombus, status post                    thrombectomy with residual 55-60% ICA stenosis. Risk Factors:      Hypertension, hyperlipidemia, current smoker, coronary artery                    disease, PAD. Other Factors:     On Coumadin for clotting disorder. Comparison Study:  No prior study Performing Technologist: Sherren Kerns RVS  Examination Guidelines: A complete evaluation includes B-mode imaging,  spectral Doppler, color Doppler, and power Doppler as needed of all accessible portions of each vessel. Bilateral testing is considered an integral part of a complete examination. Limited examinations for reoccurring indications may be performed as noted.  Right Carotid Findings: +----------+--------+--------+--------+------------------+------------------+           PSV cm/sEDV cm/sStenosisPlaque DescriptionComments           +----------+--------+--------+--------+------------------+------------------+ CCA Prox  89      17                                intimal thickening +----------+--------+--------+--------+------------------+------------------+ CCA Mid                           focal and calcific                   +----------+--------+--------+--------+------------------+------------------+ CCA  Distal82      17                                intimal thickening +----------+--------+--------+--------+------------------+------------------+ ICA Prox  77      20      1-39%   calcific          Shadowing          +----------+--------+--------+--------+------------------+------------------+ ICA Mid   108     20                                                   +----------+--------+--------+--------+------------------+------------------+ ICA Distal91      27                                                   +----------+--------+--------+--------+------------------+------------------+ ECA       117     11                                                   +----------+--------+--------+--------+------------------+------------------+ +----------+--------+-------+--------+-------------------+           PSV cm/sEDV cmsDescribeArm Pressure (mmHG) +----------+--------+-------+--------+-------------------+ Subclavian70                                         +----------+--------+-------+--------+-------------------+ +---------+--------+--+--------+--+ VertebralPSV cm/s70EDV cm/s14 +---------+--------+--+--------+--+  Left Carotid Findings: +----------+--------+--------+--------+------------------+------------------+           PSV cm/sEDV cm/sStenosisPlaque DescriptionComments           +----------+--------+--------+--------+------------------+------------------+ CCA Prox  85      21                                intimal thickening +----------+--------+--------+--------+------------------+------------------+ CCA Distal81      16  intimal thickening +----------+--------+--------+--------+------------------+------------------+ ICA Prox  82      23      1-39%   calcific                             +----------+--------+--------+--------+------------------+------------------+ ICA Mid   77      21                                                    +----------+--------+--------+--------+------------------+------------------+ ICA Distal75      23                                                   +----------+--------+--------+--------+------------------+------------------+ ECA       77      12                                                   +----------+--------+--------+--------+------------------+------------------+ +----------+--------+--------+--------+-------------------+           PSV cm/sEDV cm/sDescribeArm Pressure (mmHG) +----------+--------+--------+--------+-------------------+ [id number]                                          +----------+--------+--------+--------+-------------------+ +---------+--------+--+--------+--+ VertebralPSV cm/s72EDV cm/s16 +---------+--------+--+--------+--+   Summary: Right Carotid: Velocities in the right ICA are consistent with a 1-39% stenosis. Left Carotid: Velocities in the left ICA are consistent with a 1-39% stenosis. Vertebrals:  Bilateral vertebral arteries demonstrate antegrade flow. Subclavians: Normal flow hemodynamics were seen in bilateral subclavian              arteries. *See table(s) above for measurements and observations.  Electronically signed by Delia Heady MD on 01/29/2023 at 9:23:08 AM.    Final    IR PERCUTANEOUS ART THROMBECTOMY/INFUSION INTRACRANIAL INC DIAG ANGIO  Result Date: 01/29/2023 INDICATION: New onset right gaze deviation and left-sided neglect, and left hemiparesis. Near occlusive prominent thrombus in the right internal carotid artery. EXAM: 1. EMERGENT LARGE VESSEL OCCLUSION THROMBOLYSIS (anterior CIRCULATION) COMPARISON:  CT angiogram of the head and neck of Jan 27, 2023. MEDICATIONS: Ancef 2 g IV antibiotic was administered within 1 hour of the procedure. ANESTHESIA/SEDATION: General anesthesia. CONTRAST:  Omnipaque 300 approximately 80 mL. FLUOROSCOPY TIME:  Fluoroscopy Time: 17 minutes 24 seconds (947 mGy).  COMPLICATIONS: None immediate. TECHNIQUE: Following a full explanation of the procedure along with the potential associated complications, an informed witnessed consent was obtained. The risks of intracranial hemorrhage of 10%, worsening neurological deficit, ventilator dependency, death and inability to revascularize were all reviewed in detail with the patient. The patient was then put under general anesthesia by the Department of Anesthesiology at [facility]. The right groin was prepped and draped in the usual sterile fashion. Thereafter using modified Seldinger technique, transfemoral access into the right common femoral artery was obtained without difficulty. Over an 0.035 inch guidewire an 8 French 25 cm Arrow sheath was inserted. Through this, and also over an 0.035 inch guidewire a combination  of a Berenstein 125 cm 5.5 [place] support catheter inside of an 087 95 cm balloon guide catheter was advanced to the right common carotid artery. The guidewire, and the support catheter were removed. Arteriograms were then performed through the balloon guide catheter extra cranially and intracranially. FINDINGS: The right common carotid bifurcation demonstrates the right external carotid artery and its major branches to be widely patent. The right internal carotid artery at the bulb has a circumferential atherosclerotic plaque associated with an approximately 3 cm irregular filling defect nearly occluding the internal carotid artery. More distally, patency is maintained of the distal cervical right ICA, the petrous, the cavernous and the supraclinoid right ICA. A right posterior communicating artery is seen opacifying the right posterior cerebral artery distribution. The right middle cerebral artery opacifies into the capillary and venous phases. A hypoplastic right A1 segment is noted with continuation into the A2 region. PROCEDURE: Through the balloon guide catheter, an 021 150 cm microcatheter was advanced  over an 014 inch soft tip micro guidewire with a moderate J configuration to just proximal to the right internal carotid artery origin. At this time, proximal flow arrest was initiated by inflating the balloon guide catheter in the distal right common carotid artery. Also a combination of the micro guidewire and microcatheter was advanced through the internal carotid artery, slight proximal aspiration was applied with a 20 mL syringe as the combination was navigated to the petrous segment of the right ICA. The aspiration was then stopped. The micro guidewire was removed. A 6.5 mm x 47 mm Embotrap retrieved retrieval device was then advanced to the distal end of the microcatheter and deployed such that the proximal portion of the device was in the distal cervical right ICA. The microcatheter was removed. With the proximal flow arrest intact, proximal aspiration was then performed at the hub of the balloon guide catheter with a Penumbra aspiration device, as the retriever was removed. An 071 Zoom aspiration catheter was then advanced over an 014 microcatheter as aspiration was performed. The 071 Zoom aspiration catheter was advanced into the right internal carotid artery proximally and distally and then distal to proximal flow arrest still intact. Thick irregular grayish white clot fragments were retained in the retriever, and in the canister. Following reversal of flow arrest in the right common carotid artery, a control arteriogram performed through the balloon guide catheter revealed no residual filling defects in the right internal carotid artery extra cranially and intracranially. Right MCA remained widely patent without evidence of intraluminal filling defects, or of occlusions. The non dominant right A1 and A2 segments also demonstrated no changes. The balloon guide wire was removed. Manual compression was applied with quick clot for 25 minutes to obtain hemostasis at the right groin puncture site. Distal  pulses remained Dopplerable in both feet unchanged at the end of procedure. A flat panel CT of the brain demonstrated no evidence of intracranial hemorrhage. The patient's general anesthesia was then reversed and the patient was extubated. Upon recovery, the patient was able to obey simple commands though had no movement in her left arm and leg. She was then transferred to the neuro ICU for post revascularization care. IMPRESSION: Status post right ICA extra cranially with 1 pass with a 6.5 mm x 47 mm Emboshield retrieval device and aspiration achieving complete revascularization. Approximately 65% stenosis of the right internal carotid artery at the bulb. PLAN: We will probably need placement of a stent at the right internal carotid artery proximally to prevent future  ischemic strokes in the right MCA distribution. Electronically Signed   By: Julieanne Cotton M.D.   On: 01/29/2023 08:42   IR CT Head Ltd  Result Date: 01/29/2023 INDICATION: New onset right gaze deviation and left-sided neglect, and left hemiparesis. Near occlusive prominent thrombus in the right internal carotid artery. EXAM: 1. EMERGENT LARGE VESSEL OCCLUSION THROMBOLYSIS (anterior CIRCULATION) COMPARISON:  CT angiogram of the head and neck of Jan 27, 2023. MEDICATIONS: Ancef 2 g IV antibiotic was administered within 1 hour of the procedure. ANESTHESIA/SEDATION: General anesthesia. CONTRAST:  Omnipaque 300 approximately 80 mL. FLUOROSCOPY TIME:  Fluoroscopy Time: 17 minutes 24 seconds (947 mGy). COMPLICATIONS: None immediate. TECHNIQUE: Following a full explanation of the procedure along with the potential associated complications, an informed witnessed consent was obtained. The risks of intracranial hemorrhage of 10%, worsening neurological deficit, ventilator dependency, death and inability to revascularize were all reviewed in detail with the patient. The patient was then put under general anesthesia by the Department of Anesthesiology at  [facility]. The right groin was prepped and draped in the usual sterile fashion. Thereafter using modified Seldinger technique, transfemoral access into the right common femoral artery was obtained without difficulty. Over an 0.035 inch guidewire an 8 French 25 cm Arrow sheath was inserted. Through this, and also over an 0.035 inch guidewire a combination of a Berenstein 125 cm 5.5 [place] support catheter inside of an 087 95 cm balloon guide catheter was advanced to the right common carotid artery. The guidewire, and the support catheter were removed. Arteriograms were then performed through the balloon guide catheter extra cranially and intracranially. FINDINGS: The right common carotid bifurcation demonstrates the right external carotid artery and its major branches to be widely patent. The right internal carotid artery at the bulb has a circumferential atherosclerotic plaque associated with an approximately 3 cm irregular filling defect nearly occluding the internal carotid artery. More distally, patency is maintained of the distal cervical right ICA, the petrous, the cavernous and the supraclinoid right ICA. A right posterior communicating artery is seen opacifying the right posterior cerebral artery distribution. The right middle cerebral artery opacifies into the capillary and venous phases. A hypoplastic right A1 segment is noted with continuation into the A2 region. PROCEDURE: Through the balloon guide catheter, an 021 150 cm microcatheter was advanced over an 014 inch soft tip micro guidewire with a moderate J configuration to just proximal to the right internal carotid artery origin. At this time, proximal flow arrest was initiated by inflating the balloon guide catheter in the distal right common carotid artery. Also a combination of the micro guidewire and microcatheter was advanced through the internal carotid artery, slight proximal aspiration was applied with a 20 mL syringe as the  combination was navigated to the petrous segment of the right ICA. The aspiration was then stopped. The micro guidewire was removed. A 6.5 mm x 47 mm Embotrap retrieved retrieval device was then advanced to the distal end of the microcatheter and deployed such that the proximal portion of the device was in the distal cervical right ICA. The microcatheter was removed. With the proximal flow arrest intact, proximal aspiration was then performed at the hub of the balloon guide catheter with a Penumbra aspiration device, as the retriever was removed. An 071 Zoom aspiration catheter was then advanced over an 014 microcatheter as aspiration was performed. The 071 Zoom aspiration catheter was advanced into the right internal carotid artery proximally and distally and then distal to proximal flow arrest  still intact. Thick irregular grayish white clot fragments were retained in the retriever, and in the canister. Following reversal of flow arrest in the right common carotid artery, a control arteriogram performed through the balloon guide catheter revealed no residual filling defects in the right internal carotid artery extra cranially and intracranially. Right MCA remained widely patent without evidence of intraluminal filling defects, or of occlusions. The non dominant right A1 and A2 segments also demonstrated no changes. The balloon guide wire was removed. Manual compression was applied with quick clot for 25 minutes to obtain hemostasis at the right groin puncture site. Distal pulses remained Dopplerable in both feet unchanged at the end of procedure. A flat panel CT of the brain demonstrated no evidence of intracranial hemorrhage. The patient's general anesthesia was then reversed and the patient was extubated. Upon recovery, the patient was able to obey simple commands though had no movement in her left arm and leg. She was then transferred to the neuro ICU for post revascularization care. IMPRESSION: Status post  right ICA extra cranially with 1 pass with a 6.5 mm x 47 mm Emboshield retrieval device and aspiration achieving complete revascularization. Approximately 65% stenosis of the right internal carotid artery at the bulb. PLAN: We will probably need placement of a stent at the right internal carotid artery proximally to prevent future ischemic strokes in the right MCA distribution. Electronically Signed   By: Julieanne Cotton M.D.   On: 01/29/2023 08:42   ECHOCARDIOGRAM COMPLETE  Result Date: 01/28/2023    ECHOCARDIOGRAM REPORT   Patient Name:   CHASIDY LANDESMAN Date of Exam: 01/28/2023 Medical Rec #:  [id number]     Height:       64.0 in Accession #:    [id number]    Weight:       112.7 lb Date of Birth:  10-Sep-1950      BSA:          1.533 m Patient Age:    21 years      BP:           124/71 mmHg Patient Gender: F             HR:           68 bpm. Exam Location:  Inpatient Procedure: 2D Echo, Color Doppler and Cardiac Doppler Indications:    Stroke  History:        Patient has prior history of Echocardiogram examinations and                 Patient has no prior history of Echocardiogram examinations.                 Right ICA occlusion, PAD, Smoker, HTN, HLD, CAD.  Sonographer:    Milbert Coulter Referring Phys: [id number] JINDONG XU IMPRESSIONS  1. Left ventricular ejection fraction, by estimation, is 55 to 60%. The left ventricle has normal function. The left ventricle has no regional wall motion abnormalities. Left ventricular diastolic parameters are consistent with Grade I diastolic dysfunction (impaired relaxation).  2. Right ventricular systolic function is normal. The right ventricular size is normal. Tricuspid regurgitation signal is inadequate for assessing PA pressure.  3. The mitral valve is grossly normal. Trivial mitral valve regurgitation.  4. The aortic valve is tricuspid. There is mild calcification of the aortic valve. There is mild thickening of the aortic valve. Aortic valve regurgitation is not  visualized. Aortic valve sclerosis/calcification is present, without any evidence of aortic stenosis.  5. The inferior vena cava is normal in size with greater than 50% respiratory variability, suggesting right atrial pressure of 3 mmHg. Comparison(s): No prior Echocardiogram. Conclusion(s)/Recommendation(s): No intracardiac source of embolism detected on this transthoracic study. Consider a transesophageal echocardiogram to exclude cardiac source of embolism if clinically indicated. FINDINGS  Left Ventricle: Left ventricular ejection fraction, by estimation, is 55 to 60%. The left ventricle has normal function. The left ventricle has no regional wall motion abnormalities. The left ventricular internal cavity size was normal in size. There is  no left ventricular hypertrophy. Left ventricular diastolic parameters are consistent with Grade I diastolic dysfunction (impaired relaxation). Right Ventricle: The right ventricular size is normal. No increase in right ventricular wall thickness. Right ventricular systolic function is normal. Tricuspid regurgitation signal is inadequate for assessing PA pressure. Left Atrium: Left atrial size was normal in size. Right Atrium: Right atrial size was normal in size. Pericardium: There is no evidence of pericardial effusion. Mitral Valve: The mitral valve is grossly normal. Trivial mitral valve regurgitation. Tricuspid Valve: The tricuspid valve is normal in structure. Tricuspid valve regurgitation is trivial. Aortic Valve: The aortic valve is tricuspid. There is mild calcification of the aortic valve. There is mild thickening of the aortic valve. Aortic valve regurgitation is not visualized. Aortic valve sclerosis/calcification is present, without any evidence of aortic stenosis. Aortic valve mean gradient measures 4.0 mmHg. Aortic valve peak gradient measures 7.3 mmHg. Aortic valve area, by VTI measures 1.23 cm. Pulmonic Valve: The pulmonic valve was not well visualized.  Pulmonic valve regurgitation is trivial. Aorta: The aortic root is normal in size and structure. Venous: The inferior vena cava is normal in size with greater than 50% respiratory variability, suggesting right atrial pressure of 3 mmHg. IAS/Shunts: The atrial septum is grossly normal.  LEFT VENTRICLE PLAX 2D LVIDd:         3.60 cm     Diastology LVIDs:         2.50 cm     LV e' medial:    9.68 cm/s LV PW:         1.10 cm     LV E/e' medial:  8.9 LV IVS:        1.00 cm     LV e' lateral:   12.70 cm/s LVOT diam:     1.70 cm     LV E/e' lateral: 6.7 LV SV:         41 LV SV Index:   27 LVOT Area:     2.27 cm  LV Volumes (MOD) LV vol d, MOD A2C: 38.2 ml LV vol d, MOD A4C: 42.6 ml LV vol s, MOD A2C: 10.4 ml LV vol s, MOD A4C: 16.1 ml LV SV MOD A2C:     27.8 ml LV SV MOD A4C:     42.6 ml LV SV MOD BP:      28.3 ml RIGHT VENTRICLE RV Basal diam:  2.70 cm RV Mid diam:    2.30 cm RV S prime:     11.70 cm/s TAPSE (M-mode): 2.3 cm LEFT ATRIUM             Index        RIGHT ATRIUM           Index LA diam:        2.50 cm 1.63 cm/m   RA Area:     12.10 cm LA Vol (A2C):   34.1 ml 22.25 ml/m  RA Volume:   27.70  ml  18.07 ml/m LA Vol (A4C):   27.2 ml 17.74 ml/m LA Biplane Vol: 31.1 ml 20.29 ml/m  AORTIC VALVE AV Area (Vmax):    1.59 cm AV Area (Vmean):   1.42 cm AV Area (VTI):     1.23 cm AV Vmax:           135.00 cm/s AV Vmean:          97.300 cm/s AV VTI:            0.331 m AV Peak Grad:      7.3 mmHg AV Mean Grad:      4.0 mmHg LVOT Vmax:         94.60 cm/s LVOT Vmean:        60.700 cm/s LVOT VTI:          0.179 m LVOT/AV VTI ratio: 0.54  AORTA Ao Root diam: 3.10 cm Ao Asc diam:  2.50 cm MITRAL VALVE MV Area (PHT): 4.06 cm    SHUNTS MV Decel Time: 187 msec    Systemic VTI:  0.18 m MV E velocity: 85.70 cm/s  Systemic Diam: 1.70 cm MV A velocity: 85.70 cm/s MV E/A ratio:  1.00 Laurance Flatten MD Electronically signed by Laurance Flatten MD Signature Date/Time: 01/28/2023/3:48:06 PM    Final    MR BRAIN WO  CONTRAST  Result Date: 01/27/2023 CLINICAL DATA:  Stroke, follow-up EXAM: MRI HEAD WITHOUT CONTRAST TECHNIQUE: Multiplanar, multiecho pulse sequences of the brain and surrounding structures were obtained without intravenous contrast. COMPARISON:  No prior MRI available, correlation is made with CT head 01/27/2023 FINDINGS: Brain: Scattered foci of restricted diffusion with ADC correlate throughout the right cerebral hemisphere, with more focal and contiguous areas of restricted diffusion in the right frontal and parietal cortex (series 5, image 89), watershed territory (series 5, image 85), right caudate and lentiform nucleus (series 5, image 78), and right occipital lobe (series 5, image 68-70). No acute hemorrhage, mass, mass effect, or midline shift. No hydrocephalus or extra-axial collection. No hemosiderin deposition to suggest remote hemorrhage. Scattered and confluent T2 hyperintense signal in the periventricular white matter and pons, likely the sequela of moderate to severe chronic small vessel ischemic disease. Normal cerebral volume for age. Vascular: Normal arterial flow voids. Skull and upper cervical spine: Normal marrow signal. Sinuses/Orbits: Mucosal thickening in the left ethmoid air cells. No acute finding in the orbits. Other: The mastoids are well aerated. IMPRESSION: Scattered acute infarcts throughout the right cerebral hemisphere, with more focal areas of infarction in the right frontal and parietal cortex, watershed territory, right caudate and lentiform nucleus, and right occipital lobe. These results will be called to the ordering clinician or representative by the Radiologist Assistant, and communication documented in the PACS or [employer]. Electronically Signed   By: Wiliam Ke M.D.   On: 01/27/2023 23:45   CT HEAD CODE STROKE WO CONTRAST  Result Date: 01/27/2023 CLINICAL DATA:  Code stroke.  Left-sided weakness, right-sided gaze. EXAM: CT ANGIOGRAPHY HEAD AND NECK  TECHNIQUE: Multidetector CT imaging of the head and neck was performed using the standard protocol during bolus administration of intravenous contrast. Multiplanar CT image reconstructions and MIPs were obtained to evaluate the vascular anatomy. Carotid stenosis measurements (when applicable) are obtained utilizing NASCET criteria, using the distal internal carotid diameter as the denominator. RADIATION DOSE REDUCTION: This exam was performed according to the departmental dose-optimization program which includes automated exposure control, adjustment of the mA and/or kV according to patient size and/or use  of iterative reconstruction technique. CONTRAST:  75 cc Omnipaque 350 COMPARISON:  CT head 09/29/2021 FINDINGS: CT HEAD FINDINGS Brain: There is no evidence of acute intracranial hemorrhage, extra-axial fluid collection, or acute territorial infarct. Parenchymal volume is normal. The ventricles are normal in size. Gray-white differentiation is preserved. There is patchy hypodensity in the supratentorial white matter likely reflecting sequela of advanced chronic small-vessel ischemic change. Pituitary and suprasellar region are normal. There is no mass lesion there is no mass effect or midline shift. Vascular: No hyperdense vessel is seen. Skull: Normal. Negative for fracture or focal lesion. Sinuses/Orbits: The paranasal sinuses are clear. The globes and orbits are unremarkable. Other: None. ASPECTS [facility] Stroke Program Early CT Score) - Ganglionic level infarction (caudate, lentiform nuclei, internal capsule, insula, M1-M3 cortex): 7 - Supraganglionic infarction (M4-M6 cortex): 3 Total score (0-10 with 10 being normal): 10 CTA NECK FINDINGS Aortic arch: There is mild calcified plaque in the imaged aortic arch. The origins of the major branch vessels are patent. The subclavian arteries are patent to the level imaged. Right carotid system: The right common carotid artery is patent. There is mixed plaque of the  bifurcation with noncalcified plaque projecting into the lumen resulting in hemodynamically significant stenosis just after the bifurcation, estimated at 60%. Distally, there is additional mural adherent clot in the lumen of the ICA resulting in high-grade stenosis, estimated at 80%. There is no raised dissection flap. The distal internal carotid artery is patent. The external carotid artery is patent. There is no evidence of aneurysm/pseudoaneurysm. Left carotid system: The left common carotid artery is patent. There is calcified plaque at the bifurcation without hemodynamically significant stenosis or occlusion. The internal and external carotid arteries are patent. There is no evidence of dissection or aneurysm/pseudoaneurysm. Vertebral arteries: The vertebral arteries are patent, without hemodynamically significant stenosis or occlusion. There is no evidence of dissection or aneurysm/pseudoaneurysm. Skeleton: There is no acute osseous abnormality or suspicious osseous lesion. There is no visible canal hematoma. Other neck: There is extensive dental disease. The soft tissues of the neck are unremarkable. Upper chest: There is emphysema in the lung apices. Review of the MIP images confirms the above findings CTA HEAD FINDINGS Anterior circulation: There is calcified plaque in the intracranial ICAs without significant stenosis or occlusion. The right M1 segment is patent. The branch vessels are patent, without proximal stenosis or occlusion. The left MCA is patent, without proximal stenosis or occlusion. The bilateral ACAs are patent, without proximal stenosis or occlusion. There is no aneurysm or AVM. Posterior circulation: The bilateral V4 segments are patent. The basilar artery is patent. The major cerebellar arteries appear patent. The bilateral PCAs are patent, without proximal stenosis or occlusion. There is a fetal origin of the right PCA. A left posterior communicating artery is also identified. There is no  aneurysm or AVM. Venous sinuses: Patent. Anatomic variants: As above. Review of the MIP images confirms the above findings IMPRESSION: 1. No acute intracranial pathology.  No evidence of evolved infarct. 2. Mural adherent clot in the right internal carotid artery after the bifurcation resulting in high-grade stenosis, estimated at 80%. 3. More proximally in the right internal carotid artery just after the bifurcation, mixed plaque results in proximally 60% stenosis. 4. Mild plaque at the left carotid bifurcation without significant stenosis. Patent vertebral arteries in the neck. 5. Patent intracranial vasculature. 6. Emphysema. Findings of the noncontrast head CT were communicated to Dr Roda Shutters at 11:32 am. The the CTA results were discussed at 11:43 am.  Electronically Signed   By: Lesia Hausen M.D.   On: 01/27/2023 11:57   CT ANGIO HEAD NECK W WO CM (CODE STROKE)  Result Date: 01/27/2023 CLINICAL DATA:  Code stroke.  Left-sided weakness, right-sided gaze. EXAM: CT ANGIOGRAPHY HEAD AND NECK TECHNIQUE: Multidetector CT imaging of the head and neck was performed using the standard protocol during bolus administration of intravenous contrast. Multiplanar CT image reconstructions and MIPs were obtained to evaluate the vascular anatomy. Carotid stenosis measurements (when applicable) are obtained utilizing NASCET criteria, using the distal internal carotid diameter as the denominator. RADIATION DOSE REDUCTION: This exam was performed according to the departmental dose-optimization program which includes automated exposure control, adjustment of the mA and/or kV according to patient size and/or use of iterative reconstruction technique. CONTRAST:  75 cc Omnipaque 350 COMPARISON:  CT head 09/29/2021 FINDINGS: CT HEAD FINDINGS Brain: There is no evidence of acute intracranial hemorrhage, extra-axial fluid collection, or acute territorial infarct. Parenchymal volume is normal. The ventricles are normal in size. Gray-white  differentiation is preserved. There is patchy hypodensity in the supratentorial white matter likely reflecting sequela of advanced chronic small-vessel ischemic change. Pituitary and suprasellar region are normal. There is no mass lesion there is no mass effect or midline shift. Vascular: No hyperdense vessel is seen. Skull: Normal. Negative for fracture or focal lesion. Sinuses/Orbits: The paranasal sinuses are clear. The globes and orbits are unremarkable. Other: None. ASPECTS [facility] Stroke Program Early CT Score) - Ganglionic level infarction (caudate, lentiform nuclei, internal capsule, insula, M1-M3 cortex): 7 - Supraganglionic infarction (M4-M6 cortex): 3 Total score (0-10 with 10 being normal): 10 CTA NECK FINDINGS Aortic arch: There is mild calcified plaque in the imaged aortic arch. The origins of the major branch vessels are patent. The subclavian arteries are patent to the level imaged. Right carotid system: The right common carotid artery is patent. There is mixed plaque of the bifurcation with noncalcified plaque projecting into the lumen resulting in hemodynamically significant stenosis just after the bifurcation, estimated at 60%. Distally, there is additional mural adherent clot in the lumen of the ICA resulting in high-grade stenosis, estimated at 80%. There is no raised dissection flap. The distal internal carotid artery is patent. The external carotid artery is patent. There is no evidence of aneurysm/pseudoaneurysm. Left carotid system: The left common carotid artery is patent. There is calcified plaque at the bifurcation without hemodynamically significant stenosis or occlusion. The internal and external carotid arteries are patent. There is no evidence of dissection or aneurysm/pseudoaneurysm. Vertebral arteries: The vertebral arteries are patent, without hemodynamically significant stenosis or occlusion. There is no evidence of dissection or aneurysm/pseudoaneurysm. Skeleton: There is no  acute osseous abnormality or suspicious osseous lesion. There is no visible canal hematoma. Other neck: There is extensive dental disease. The soft tissues of the neck are unremarkable. Upper chest: There is emphysema in the lung apices. Review of the MIP images confirms the above findings CTA HEAD FINDINGS Anterior circulation: There is calcified plaque in the intracranial ICAs without significant stenosis or occlusion. The right M1 segment is patent. The branch vessels are patent, without proximal stenosis or occlusion. The left MCA is patent, without proximal stenosis or occlusion. The bilateral ACAs are patent, without proximal stenosis or occlusion. There is no aneurysm or AVM. Posterior circulation: The bilateral V4 segments are patent. The basilar artery is patent. The major cerebellar arteries appear patent. The bilateral PCAs are patent, without proximal stenosis or occlusion. There is a fetal origin of the right PCA.  A left posterior communicating artery is also identified. There is no aneurysm or AVM. Venous sinuses: Patent. Anatomic variants: As above. Review of the MIP images confirms the above findings IMPRESSION: 1. No acute intracranial pathology.  No evidence of evolved infarct. 2. Mural adherent clot in the right internal carotid artery after the bifurcation resulting in high-grade stenosis, estimated at 80%. 3. More proximally in the right internal carotid artery just after the bifurcation, mixed plaque results in proximally 60% stenosis. 4. Mild plaque at the left carotid bifurcation without significant stenosis. Patent vertebral arteries in the neck. 5. Patent intracranial vasculature. 6. Emphysema. Findings of the noncontrast head CT were communicated to Dr Roda Shutters at 11:32 am. The the CTA results were discussed at 11:43 am. Electronically Signed   By: Lesia Hausen M.D.   On: 01/27/2023 11:57     PHYSICAL EXAM  Temp:  [98 F (36.7 C)-98.8 F (37.1 C)] 98.1 F (36.7 C) (06/01 1124) Pulse  Rate:  [73-90] 73 (06/01 1124) Resp:  [16-19] 16 (06/01 1124) BP: (122-152)/(75-82) 122/75 (06/01 1124) SpO2:  [96 %-100 %] 97 % (06/01 1124)  General - Well nourished, well developed, in no apparent distress.  Cardiovascular - Regular rhythm and rate.  Neuro - awake, alert, eyes open. A/O x 3. following all simple commands. Able to name and repeat, mild dysarthria. Right gaze preference but able to have left gaze, left lower quadrantanopia. Left mild facial droop. Tongue midline.   Motor/Sensory: LUE 4/5 proximal and distally, BLEs and RUE 5/5. Sensation symmetrical bilaterally however with LUE simultagnosia,     ASSESSMENT/PLAN Ms. NYLAA BULLEN is a 72 y.o. female with history of PAD, CAD, smoker, HTN, HLD, on chronic coumadin presented for left sided weakness, right forced gaze and left neglect. NIHSS = 14. CT no acute finding. INR 2.4. CTA showed right carotid mural thrombus with high grade stenosis. Not TNK candidate due to high INR. Given severe neuro deficit and right ICA flow limiting on imaging, discussed with Dr. Corliss Skains, activated code IR.    Stroke:  right MCA and MCA/ACA scattered infarct embolic from intramural right ICA thrombus, secondary to ? Coagulation disorder ? Even on coumadin with therapeutic INR CT no acute finding CTA showed right carotid mural thrombus with high grade stenosis 80%. More proximally in the right internal carotid artery just after the bifurcation, mixed plaque results in proximally 60% stenosis. MRI  Scattered acute infarcts throughout the right cerebral hemisphere, with more focal areas of infarction in the right frontal and parietal cortex, watershed territory, right caudate and lentiform nucleus, and right occipital lobe. Carotid Doppler 1 to 39% bilaterally 2D Echo EF 55 to 60% LDL 53 HgbA1c 6.2 Coumadin for VTE prophylaxis warfarin daily prior to admission, now on aspirin 325 mg daily and warfarin daily, will need to increase the goal INR to  2.5-3.5. once INR at goal, ASA can be discontinued. Patient counseled to be compliant with her antithrombotic medications Ongoing aggressive stroke risk factor management Therapy recommendations: CIR Disposition:  pending  ? Coagulation disorder On Coumadin at home PTA INR 2.4->2.2->2.1 Given developed carotid intramural thrombus in the setting of therapeutic INR, will increase INR goal to 2.5-3.5.  Hypertension Stable Off Cleviprex BP goal less than 180/105 Long term BP goal normotensive  Hyperlipidemia Home meds: Lipitor 10 LDL 53, goal < 70 Now on Lipitor 10 No high intensity statin given LDL within goal Continue statin at discharge  Tobacco abuse Current smoker Smoking cessation counseling provided Nicotine patch provided  Pt is willing to quit  Other Stroke Risk Factors Advanced age PAD CAD  Other Active Problems Mild hyponatremia, sodium 134  Hospital day # 3  Pending CIR. INR today at 2.3 and below goal. Appreciate pharmacy assistance.   1:11 PM    To contact Stroke Continuity provider, please refer to [email or web address]. After hours, contact General Neurology

## 2023-01-30 NOTE — Progress Notes (Signed)
ANTICOAGULATION CONSULT NOTE  Pharmacy Consult:  Coumadin Indication:  PVD  No Known Allergies  Patient Measurements: Height: 5\' 4"  (162.6 cm) Weight: 51.1 kg (112 lb 10.5 oz) IBW/kg (Calculated) : 54.7  Vital Signs: Temp: 98.8 F (37.1 C) (06/01 0427) Temp Source: Oral (06/01 0427) BP: 135/76 (06/01 0427) Pulse Rate: 73 (06/01 0427)  Labs: Recent Labs    01/27/23 1126 01/27/23 1128 01/28/23 0250 01/29/23 0418 01/30/23 0249  HGB 13.1 13.9 11.9*  --   --   HCT 39.3 41.0 35.0*  --   --   PLT 243  --  201  --   --   APTT 30  --   --   --   --   LABPROT 26.5*  --  24.9* 23.9* 25.8*  INR 2.4*  --  2.2* 2.1* 2.3*  CREATININE 0.89 0.80 0.75  --   --      Estimated Creatinine Clearance: 53.5 mL/min (by C-G formula based on SCr of 0.75 mg/dL).   Medical History: Past Medical History:  Diagnosis Date   Arthritis    Hyperlipidemia    Hypertension    Hyperuricemia    Osteoporosis    PAD (peripheral artery disease) (HCC) 02/2010   angiplasty, popliteal artery   Tobacco abuse     Assessment: 72 yo F on Coumadin 5mg  PO daily except 3mg  one day per week PTA for history of PVD.  INR was therapeutic at 2.4 and patient presented with scattered infarcts.  INR goal increased to 2.5-3.5 per Neuro.  No hemorrhage and Pharmacy consulted to resume Coumadin.  ASA also started.  INR today sub-therapeutic at 2.3, trending up from yesterday; no bleeding reported.  Goal of Therapy:  INR goal 2.5-3.5 per Neuro Monitor platelets by anticoagulation protocol: Yes   Plan:  Warfarin 6 mg x 1 Daily PT / INR Stop ASA when INR >/= 2.5  Larena Sox, PharmD PGY1 Pharmacy Resident   01/30/2023  8:25 AM

## 2023-01-31 DIAGNOSIS — I63131 Cerebral infarction due to embolism of right carotid artery: Secondary | ICD-10-CM | POA: Diagnosis not present

## 2023-01-31 LAB — PROTIME-INR
INR: 2.7 — ABNORMAL HIGH (ref 0.8–1.2)
Prothrombin Time: 29.1 seconds — ABNORMAL HIGH (ref 11.4–15.2)

## 2023-01-31 MED ORDER — WARFARIN SODIUM 5 MG PO TABS
5.0000 mg | ORAL_TABLET | Freq: Once | ORAL | Status: AC
  Administered 2023-01-31: 5 mg via ORAL
  Filled 2023-01-31: qty 1

## 2023-01-31 MED ORDER — ASPIRIN 325 MG PO TBEC
325.0000 mg | DELAYED_RELEASE_TABLET | Freq: Every day | ORAL | Status: DC
  Administered 2023-01-31 – 2023-02-01 (×2): 325 mg via ORAL
  Filled 2023-01-31 (×2): qty 1

## 2023-01-31 NOTE — Progress Notes (Addendum)
STROKE TEAM PROGRESS NOTE   SUBJECTIVE (INTERVAL HISTORY) No family is at the bedside. No complaints. No headache. Pharmacy dosing coumadin. INR 2.7 today  OBJECTIVE Temp:  [98.1 F (36.7 C)-98.9 F (37.2 C)] 98.2 F (36.8 C) (06/02 0726) Pulse Rate:  [70-81] 71 (06/02 0726) Cardiac Rhythm: Normal sinus rhythm (06/02 0700) Resp:  [14-19] 18 (06/02 0726) BP: (122-149)/(70-87) 131/71 (06/02 0726) SpO2:  [96 %-100 %] 97 % (06/02 0726)  Recent Labs  Lab 01/27/23 1909 01/27/23 2344 01/28/23 0307 01/28/23 0714 01/28/23 1218  GLUCAP 126* 122* 106* 82 105*    Recent Labs  Lab 01/27/23 1126 01/27/23 1128 01/28/23 0250  NA 136 139 134*  K 3.0* 2.9* 3.5  CL 101 99 101  CO2 25  --  23  GLUCOSE 127* 121* 113*  BUN 9 10 8   CREATININE 0.89 0.80 0.75  CALCIUM 9.0  --  8.4*    Recent Labs  Lab 01/27/23 1126  AST 24  ALT 16  ALKPHOS 70  BILITOT 0.4  PROT 7.4  ALBUMIN 3.8    Recent Labs  Lab 01/27/23 1126 01/27/23 1128 01/28/23 0250  WBC 9.5  --  6.5  NEUTROABS 1.8  --  4.4  HGB 13.1 13.9 11.9*  HCT 39.3 41.0 35.0*  MCV 87.9  --  86.2  PLT 243  --  201    No results for input(s): "CKTOTAL", "CKMB", "CKMBINDEX", "TROPONINI" in the last 168 hours. Recent Labs    01/29/23 0418 01/30/23 0249 01/31/23 0353  LABPROT 23.9* 25.8* 29.1*  INR 2.1* 2.3* 2.7*    No results for input(s): "COLORURINE", "LABSPEC", "PHURINE", "GLUCOSEU", "HGBUR", "BILIRUBINUR", "KETONESUR", "PROTEINUR", "UROBILINOGEN", "NITRITE", "LEUKOCYTESUR" in the last 72 hours.  Invalid input(s): "APPERANCEUR"     Component Value Date/Time   CHOL 115 01/28/2023 0250   CHOL 145 10/17/2015 1017   TRIG 57 01/28/2023 0250   HDL 51 01/28/2023 0250   HDL 43 10/17/2015 1017   CHOLHDL 2.3 01/28/2023 0250   VLDL 11 01/28/2023 0250   LDLCALC 53 01/28/2023 0250   LDLCALC 55 10/06/2022 1038   Lab Results  Component Value Date   HGBA1C 6.2 (H) 01/28/2023   No results found for: "LABOPIA",  "COCAINSCRNUR", "LABBENZ", "AMPHETMU", "THCU", "LABBARB"  Recent Labs  Lab 01/27/23 1126  ETH <10     I have personally reviewed the radiological images below and agree with the radiology interpretations.  VAS US CAROTID  Result Date: 01/29/2023 Carotid Arterial Duplex Study Patient Name:  Mary [name]  Date of Exam:   01/28/2023 Medical Rec #: [id number]      Accession #:    [id number] Date of Birth: 1950-11-21       Patient Gender: F Patient Age:   72 years Exam Location:  [facility] Procedure:      VAS US CAROTID Referring Phys: Mary [name] --------------------------------------------------------------------------------  Indications:       CVA, Weakness and right carotid mural thrombus, status post                    thrombectomy with residual 55-60% ICA stenosis. Risk Factors:      Hypertension, hyperlipidemia, current smoker, coronary artery                    disease, PAD. Other Factors:     On Coumadin for clotting disorder. Comparison Study:  No prior study Performing Technologist: Sherren Kerns RVS  Examination Guidelines: A complete evaluation includes B-mode imaging, spectral  Doppler, color Doppler, and power Doppler as needed of all accessible portions of each vessel. Bilateral testing is considered an integral part of a complete examination. Limited examinations for reoccurring indications may be performed as noted.  Right Carotid Findings: +----------+--------+--------+--------+------------------+------------------+           PSV cm/sEDV cm/sStenosisPlaque DescriptionComments           +----------+--------+--------+--------+------------------+------------------+ CCA Prox  89      17                                intimal thickening +----------+--------+--------+--------+------------------+------------------+ CCA Mid                           focal and calcific                   +----------+--------+--------+--------+------------------+------------------+ CCA  Distal82      17                                intimal thickening +----------+--------+--------+--------+------------------+------------------+ ICA Prox  77      20      1-39%   calcific          Shadowing          +----------+--------+--------+--------+------------------+------------------+ ICA Mid   108     20                                                   +----------+--------+--------+--------+------------------+------------------+ ICA Distal91      27                                                   +----------+--------+--------+--------+------------------+------------------+ ECA       117     11                                                   +----------+--------+--------+--------+------------------+------------------+ +----------+--------+-------+--------+-------------------+           PSV cm/sEDV cmsDescribeArm Pressure (mmHG) +----------+--------+-------+--------+-------------------+ Subclavian70                                         +----------+--------+-------+--------+-------------------+ +---------+--------+--+--------+--+ VertebralPSV cm/s70EDV cm/s14 +---------+--------+--+--------+--+  Left Carotid Findings: +----------+--------+--------+--------+------------------+------------------+           PSV cm/sEDV cm/sStenosisPlaque DescriptionComments           +----------+--------+--------+--------+------------------+------------------+ CCA Prox  85      21                                intimal thickening +----------+--------+--------+--------+------------------+------------------+ CCA Distal81      16  intimal thickening +----------+--------+--------+--------+------------------+------------------+ ICA Prox  82      23      1-39%   calcific                             +----------+--------+--------+--------+------------------+------------------+ ICA Mid   77      21                                                    +----------+--------+--------+--------+------------------+------------------+ ICA Distal75      23                                                   +----------+--------+--------+--------+------------------+------------------+ ECA       77      12                                                   +----------+--------+--------+--------+------------------+------------------+ +----------+--------+--------+--------+-------------------+           PSV cm/sEDV cm/sDescribeArm Pressure (mmHG) +----------+--------+--------+--------+-------------------+ [id number]                                          +----------+--------+--------+--------+-------------------+ +---------+--------+--+--------+--+ VertebralPSV cm/s72EDV cm/s16 +---------+--------+--+--------+--+   Summary: Right Carotid: Velocities in the right ICA are consistent with a 1-39% stenosis. Left Carotid: Velocities in the left ICA are consistent with a 1-39% stenosis. Vertebrals:  Bilateral vertebral arteries demonstrate antegrade flow. Subclavians: Normal flow hemodynamics were seen in bilateral subclavian              arteries. *See table(s) above for measurements and observations.  Electronically signed by Delia Heady MD on 01/29/2023 at 9:23:08 AM.    Final    IR PERCUTANEOUS ART THROMBECTOMY/INFUSION INTRACRANIAL INC DIAG ANGIO  Result Date: 01/29/2023 INDICATION: New onset right gaze deviation and left-sided neglect, and left hemiparesis. Near occlusive prominent thrombus in the right internal carotid artery. EXAM: 1. EMERGENT LARGE VESSEL OCCLUSION THROMBOLYSIS (anterior CIRCULATION) COMPARISON:  CT angiogram of the head and neck of Jan 27, 2023. MEDICATIONS: Ancef 2 g IV antibiotic was administered within 1 hour of the procedure. ANESTHESIA/SEDATION: General anesthesia. CONTRAST:  Omnipaque 300 approximately 80 mL. FLUOROSCOPY TIME:  Fluoroscopy Time: 17 minutes 24 seconds (947 mGy).  COMPLICATIONS: None immediate. TECHNIQUE: Following a full explanation of the procedure along with the potential associated complications, an informed witnessed consent was obtained. The risks of intracranial hemorrhage of 10%, worsening neurological deficit, ventilator dependency, death and inability to revascularize were all reviewed in detail with the patient. The patient was then put under general anesthesia by the Department of Anesthesiology at [facility]. The right groin was prepped and draped in the usual sterile fashion. Thereafter using modified Seldinger technique, transfemoral access into the right common femoral artery was obtained without difficulty. Over an 0.035 inch guidewire an 8 French 25 cm Arrow sheath was inserted. Through this, and also over an 0.035 inch guidewire a combination  of a Berenstein 125 cm 5.5 [place] support catheter inside of an 087 95 cm balloon guide catheter was advanced to the right common carotid artery. The guidewire, and the support catheter were removed. Arteriograms were then performed through the balloon guide catheter extra cranially and intracranially. FINDINGS: The right common carotid bifurcation demonstrates the right external carotid artery and its major branches to be widely patent. The right internal carotid artery at the bulb has a circumferential atherosclerotic plaque associated with an approximately 3 cm irregular filling defect nearly occluding the internal carotid artery. More distally, patency is maintained of the distal cervical right ICA, the petrous, the cavernous and the supraclinoid right ICA. A right posterior communicating artery is seen opacifying the right posterior cerebral artery distribution. The right middle cerebral artery opacifies into the capillary and venous phases. A hypoplastic right A1 segment is noted with continuation into the A2 region. PROCEDURE: Through the balloon guide catheter, an 021 150 cm microcatheter was advanced  over an 014 inch soft tip micro guidewire with a moderate J configuration to just proximal to the right internal carotid artery origin. At this time, proximal flow arrest was initiated by inflating the balloon guide catheter in the distal right common carotid artery. Also a combination of the micro guidewire and microcatheter was advanced through the internal carotid artery, slight proximal aspiration was applied with a 20 mL syringe as the combination was navigated to the petrous segment of the right ICA. The aspiration was then stopped. The micro guidewire was removed. A 6.5 mm x 47 mm Embotrap retrieved retrieval device was then advanced to the distal end of the microcatheter and deployed such that the proximal portion of the device was in the distal cervical right ICA. The microcatheter was removed. With the proximal flow arrest intact, proximal aspiration was then performed at the hub of the balloon guide catheter with a Penumbra aspiration device, as the retriever was removed. An 071 Zoom aspiration catheter was then advanced over an 014 microcatheter as aspiration was performed. The 071 Zoom aspiration catheter was advanced into the right internal carotid artery proximally and distally and then distal to proximal flow arrest still intact. Thick irregular grayish white clot fragments were retained in the retriever, and in the canister. Following reversal of flow arrest in the right common carotid artery, a control arteriogram performed through the balloon guide catheter revealed no residual filling defects in the right internal carotid artery extra cranially and intracranially. Right MCA remained widely patent without evidence of intraluminal filling defects, or of occlusions. The non dominant right A1 and A2 segments also demonstrated no changes. The balloon guide wire was removed. Manual compression was applied with quick clot for 25 minutes to obtain hemostasis at the right groin puncture site. Distal  pulses remained Dopplerable in both feet unchanged at the end of procedure. A flat panel CT of the brain demonstrated no evidence of intracranial hemorrhage. The patient's general anesthesia was then reversed and the patient was extubated. Upon recovery, the patient was able to obey simple commands though had no movement in her left arm and leg. She was then transferred to the neuro ICU for post revascularization care. IMPRESSION: Status post right ICA extra cranially with 1 pass with a 6.5 mm x 47 mm Emboshield retrieval device and aspiration achieving complete revascularization. Approximately 65% stenosis of the right internal carotid artery at the bulb. PLAN: We will probably need placement of a stent at the right internal carotid artery proximally to prevent future  ischemic strokes in the right MCA distribution. Electronically Signed   By: Julieanne Cotton M.D.   On: 01/29/2023 08:42   IR CT Head Ltd  Result Date: 01/29/2023 INDICATION: New onset right gaze deviation and left-sided neglect, and left hemiparesis. Near occlusive prominent thrombus in the right internal carotid artery. EXAM: 1. EMERGENT LARGE VESSEL OCCLUSION THROMBOLYSIS (anterior CIRCULATION) COMPARISON:  CT angiogram of the head and neck of Jan 27, 2023. MEDICATIONS: Ancef 2 g IV antibiotic was administered within 1 hour of the procedure. ANESTHESIA/SEDATION: General anesthesia. CONTRAST:  Omnipaque 300 approximately 80 mL. FLUOROSCOPY TIME:  Fluoroscopy Time: 17 minutes 24 seconds (947 mGy). COMPLICATIONS: None immediate. TECHNIQUE: Following a full explanation of the procedure along with the potential associated complications, an informed witnessed consent was obtained. The risks of intracranial hemorrhage of 10%, worsening neurological deficit, ventilator dependency, death and inability to revascularize were all reviewed in detail with the patient. The patient was then put under general anesthesia by the Department of Anesthesiology at  [facility]. The right groin was prepped and draped in the usual sterile fashion. Thereafter using modified Seldinger technique, transfemoral access into the right common femoral artery was obtained without difficulty. Over an 0.035 inch guidewire an 8 French 25 cm Arrow sheath was inserted. Through this, and also over an 0.035 inch guidewire a combination of a Berenstein 125 cm 5.5 [place] support catheter inside of an 087 95 cm balloon guide catheter was advanced to the right common carotid artery. The guidewire, and the support catheter were removed. Arteriograms were then performed through the balloon guide catheter extra cranially and intracranially. FINDINGS: The right common carotid bifurcation demonstrates the right external carotid artery and its major branches to be widely patent. The right internal carotid artery at the bulb has a circumferential atherosclerotic plaque associated with an approximately 3 cm irregular filling defect nearly occluding the internal carotid artery. More distally, patency is maintained of the distal cervical right ICA, the petrous, the cavernous and the supraclinoid right ICA. A right posterior communicating artery is seen opacifying the right posterior cerebral artery distribution. The right middle cerebral artery opacifies into the capillary and venous phases. A hypoplastic right A1 segment is noted with continuation into the A2 region. PROCEDURE: Through the balloon guide catheter, an 021 150 cm microcatheter was advanced over an 014 inch soft tip micro guidewire with a moderate J configuration to just proximal to the right internal carotid artery origin. At this time, proximal flow arrest was initiated by inflating the balloon guide catheter in the distal right common carotid artery. Also a combination of the micro guidewire and microcatheter was advanced through the internal carotid artery, slight proximal aspiration was applied with a 20 mL syringe as the  combination was navigated to the petrous segment of the right ICA. The aspiration was then stopped. The micro guidewire was removed. A 6.5 mm x 47 mm Embotrap retrieved retrieval device was then advanced to the distal end of the microcatheter and deployed such that the proximal portion of the device was in the distal cervical right ICA. The microcatheter was removed. With the proximal flow arrest intact, proximal aspiration was then performed at the hub of the balloon guide catheter with a Penumbra aspiration device, as the retriever was removed. An 071 Zoom aspiration catheter was then advanced over an 014 microcatheter as aspiration was performed. The 071 Zoom aspiration catheter was advanced into the right internal carotid artery proximally and distally and then distal to proximal flow arrest  still intact. Thick irregular grayish white clot fragments were retained in the retriever, and in the canister. Following reversal of flow arrest in the right common carotid artery, a control arteriogram performed through the balloon guide catheter revealed no residual filling defects in the right internal carotid artery extra cranially and intracranially. Right MCA remained widely patent without evidence of intraluminal filling defects, or of occlusions. The non dominant right A1 and A2 segments also demonstrated no changes. The balloon guide wire was removed. Manual compression was applied with quick clot for 25 minutes to obtain hemostasis at the right groin puncture site. Distal pulses remained Dopplerable in both feet unchanged at the end of procedure. A flat panel CT of the brain demonstrated no evidence of intracranial hemorrhage. The patient's general anesthesia was then reversed and the patient was extubated. Upon recovery, the patient was able to obey simple commands though had no movement in her left arm and leg. She was then transferred to the neuro ICU for post revascularization care. IMPRESSION: Status post  right ICA extra cranially with 1 pass with a 6.5 mm x 47 mm Emboshield retrieval device and aspiration achieving complete revascularization. Approximately 65% stenosis of the right internal carotid artery at the bulb. PLAN: We will probably need placement of a stent at the right internal carotid artery proximally to prevent future ischemic strokes in the right MCA distribution. Electronically Signed   By: Julieanne Cotton M.D.   On: 01/29/2023 08:42   ECHOCARDIOGRAM COMPLETE  Result Date: 01/28/2023    ECHOCARDIOGRAM REPORT   Patient Name:   TAYANA BADOUR Date of Exam: 01/28/2023 Medical Rec #:  [id number]     Height:       64.0 in Accession #:    [id number]    Weight:       112.7 lb Date of Birth:  [birth year]      BSA:          1.533 m Patient Age:    73 years      BP:           124/71 mmHg Patient Gender: F             HR:           68 bpm. Exam Location:  Inpatient Procedure: 2D Echo, Color Doppler and Cardiac Doppler Indications:    Stroke  History:        Patient has prior history of Echocardiogram examinations and                 Patient has no prior history of Echocardiogram examinations.                 Right ICA occlusion, PAD, Smoker, HTN, HLD, CAD.  Sonographer:    Milbert Coulter Referring Phys: [id number] JINDONG [name] IMPRESSIONS  1. Left ventricular ejection fraction, by estimation, is 55 to 60%. The left ventricle has normal function. The left ventricle has no regional wall motion abnormalities. Left ventricular diastolic parameters are consistent with Grade I diastolic dysfunction (impaired relaxation).  2. Right ventricular systolic function is normal. The right ventricular size is normal. Tricuspid regurgitation signal is inadequate for assessing PA pressure.  3. The mitral valve is grossly normal. Trivial mitral valve regurgitation.  4. The aortic valve is tricuspid. There is mild calcification of the aortic valve. There is mild thickening of the aortic valve. Aortic valve regurgitation is not  visualized. Aortic valve sclerosis/calcification is present, without any evidence of aortic stenosis.  5. The inferior vena cava is normal in size with greater than 50% respiratory variability, suggesting right atrial pressure of 3 mmHg. Comparison(s): No prior Echocardiogram. Conclusion(s)/Recommendation(s): No intracardiac source of embolism detected on this transthoracic study. Consider a transesophageal echocardiogram to exclude cardiac source of embolism if clinically indicated. FINDINGS  Left Ventricle: Left ventricular ejection fraction, by estimation, is 55 to 60%. The left ventricle has normal function. The left ventricle has no regional wall motion abnormalities. The left ventricular internal cavity size was normal in size. There is  no left ventricular hypertrophy. Left ventricular diastolic parameters are consistent with Grade I diastolic dysfunction (impaired relaxation). Right Ventricle: The right ventricular size is normal. No increase in right ventricular wall thickness. Right ventricular systolic function is normal. Tricuspid regurgitation signal is inadequate for assessing PA pressure. Left Atrium: Left atrial size was normal in size. Right Atrium: Right atrial size was normal in size. Pericardium: There is no evidence of pericardial effusion. Mitral Valve: The mitral valve is grossly normal. Trivial mitral valve regurgitation. Tricuspid Valve: The tricuspid valve is normal in structure. Tricuspid valve regurgitation is trivial. Aortic Valve: The aortic valve is tricuspid. There is mild calcification of the aortic valve. There is mild thickening of the aortic valve. Aortic valve regurgitation is not visualized. Aortic valve sclerosis/calcification is present, without any evidence of aortic stenosis. Aortic valve mean gradient measures 4.0 mmHg. Aortic valve peak gradient measures 7.3 mmHg. Aortic valve area, by VTI measures 1.23 cm. Pulmonic Valve: The pulmonic valve was not well visualized.  Pulmonic valve regurgitation is trivial. Aorta: The aortic root is normal in size and structure. Venous: The inferior vena cava is normal in size with greater than 50% respiratory variability, suggesting right atrial pressure of 3 mmHg. IAS/Shunts: The atrial septum is grossly normal.  LEFT VENTRICLE PLAX 2D LVIDd:         3.60 cm     Diastology LVIDs:         2.50 cm     LV e' medial:    9.68 cm/s LV PW:         1.10 cm     LV E/e' medial:  8.9 LV IVS:        1.00 cm     LV e' lateral:   12.70 cm/s LVOT diam:     1.70 cm     LV E/e' lateral: 6.7 LV SV:         41 LV SV Index:   27 LVOT Area:     2.27 cm  LV Volumes (MOD) LV vol d, MOD A2C: 38.2 ml LV vol d, MOD A4C: 42.6 ml LV vol s, MOD A2C: 10.4 ml LV vol s, MOD A4C: 16.1 ml LV SV MOD A2C:     27.8 ml LV SV MOD A4C:     42.6 ml LV SV MOD BP:      28.3 ml RIGHT VENTRICLE RV Basal diam:  2.70 cm RV Mid diam:    2.30 cm RV S prime:     11.70 cm/s TAPSE (M-mode): 2.3 cm LEFT ATRIUM             Index        RIGHT ATRIUM           Index LA diam:        2.50 cm 1.63 cm/m   RA Area:     12.10 cm LA Vol (A2C):   34.1 ml 22.25 ml/m  RA Volume:   27.70  ml  18.07 ml/m LA Vol (A4C):   27.2 ml 17.74 ml/m LA Biplane Vol: 31.1 ml 20.29 ml/m  AORTIC VALVE AV Area (Vmax):    1.59 cm AV Area (Vmean):   1.42 cm AV Area (VTI):     1.23 cm AV Vmax:           135.00 cm/s AV Vmean:          97.300 cm/s AV VTI:            0.331 m AV Peak Grad:      7.3 mmHg AV Mean Grad:      4.0 mmHg LVOT Vmax:         94.60 cm/s LVOT Vmean:        60.700 cm/s LVOT VTI:          0.179 m LVOT/AV VTI ratio: 0.54  AORTA Ao Root diam: 3.10 cm Ao Asc diam:  2.50 cm MITRAL VALVE MV Area (PHT): 4.06 cm    SHUNTS MV Decel Time: 187 msec    Systemic VTI:  0.18 m MV E velocity: 85.70 cm/s  Systemic Diam: 1.70 cm MV A velocity: 85.70 cm/s MV E/A ratio:  1.00 Laurance Flatten MD Electronically signed by Laurance Flatten MD Signature Date/Time: 01/28/2023/3:48:06 PM    Final    MR BRAIN WO  CONTRAST  Result Date: 01/27/2023 CLINICAL DATA:  Stroke, follow-up EXAM: MRI HEAD WITHOUT CONTRAST TECHNIQUE: Multiplanar, multiecho pulse sequences of the brain and surrounding structures were obtained without intravenous contrast. COMPARISON:  No prior MRI available, correlation is made with CT head 01/27/2023 FINDINGS: Brain: Scattered foci of restricted diffusion with ADC correlate throughout the right cerebral hemisphere, with more focal and contiguous areas of restricted diffusion in the right frontal and parietal cortex (series 5, image 89), watershed territory (series 5, image 85), right caudate and lentiform nucleus (series 5, image 78), and right occipital lobe (series 5, image 68-70). No acute hemorrhage, mass, mass effect, or midline shift. No hydrocephalus or extra-axial collection. No hemosiderin deposition to suggest remote hemorrhage. Scattered and confluent T2 hyperintense signal in the periventricular white matter and pons, likely the sequela of moderate to severe chronic small vessel ischemic disease. Normal cerebral volume for age. Vascular: Normal arterial flow voids. Skull and upper cervical spine: Normal marrow signal. Sinuses/Orbits: Mucosal thickening in the left ethmoid air cells. No acute finding in the orbits. Other: The mastoids are well aerated. IMPRESSION: Scattered acute infarcts throughout the right cerebral hemisphere, with more focal areas of infarction in the right frontal and parietal cortex, watershed territory, right caudate and lentiform nucleus, and right occipital lobe. These results will be called to the ordering clinician or representative by the Radiologist Assistant, and communication documented in the PACS or [employer]. Electronically Signed   By: Wiliam Ke M.D.   On: 01/27/2023 23:45   CT HEAD CODE STROKE WO CONTRAST  Result Date: 01/27/2023 CLINICAL DATA:  Code stroke.  Left-sided weakness, right-sided gaze. EXAM: CT ANGIOGRAPHY HEAD AND NECK  TECHNIQUE: Multidetector CT imaging of the head and neck was performed using the standard protocol during bolus administration of intravenous contrast. Multiplanar CT image reconstructions and MIPs were obtained to evaluate the vascular anatomy. Carotid stenosis measurements (when applicable) are obtained utilizing NASCET criteria, using the distal internal carotid diameter as the denominator. RADIATION DOSE REDUCTION: This exam was performed according to the departmental dose-optimization program which includes automated exposure control, adjustment of the mA and/or kV according to patient size and/or use  of iterative reconstruction technique. CONTRAST:  75 cc Omnipaque 350 COMPARISON:  CT head 09/29/2021 FINDINGS: CT HEAD FINDINGS Brain: There is no evidence of acute intracranial hemorrhage, extra-axial fluid collection, or acute territorial infarct. Parenchymal volume is normal. The ventricles are normal in size. Gray-white differentiation is preserved. There is patchy hypodensity in the supratentorial white matter likely reflecting sequela of advanced chronic small-vessel ischemic change. Pituitary and suprasellar region are normal. There is no mass lesion there is no mass effect or midline shift. Vascular: No hyperdense vessel is seen. Skull: Normal. Negative for fracture or focal lesion. Sinuses/Orbits: The paranasal sinuses are clear. The globes and orbits are unremarkable. Other: None. ASPECTS [facility] Stroke Program Early CT Score) - Ganglionic level infarction (caudate, lentiform nuclei, internal capsule, insula, M1-M3 cortex): 7 - Supraganglionic infarction (M4-M6 cortex): 3 Total score (0-10 with 10 being normal): 10 CTA NECK FINDINGS Aortic arch: There is mild calcified plaque in the imaged aortic arch. The origins of the major branch vessels are patent. The subclavian arteries are patent to the level imaged. Right carotid system: The right common carotid artery is patent. There is mixed plaque of the  bifurcation with noncalcified plaque projecting into the lumen resulting in hemodynamically significant stenosis just after the bifurcation, estimated at 60%. Distally, there is additional mural adherent clot in the lumen of the ICA resulting in high-grade stenosis, estimated at 80%. There is no raised dissection flap. The distal internal carotid artery is patent. The external carotid artery is patent. There is no evidence of aneurysm/pseudoaneurysm. Left carotid system: The left common carotid artery is patent. There is calcified plaque at the bifurcation without hemodynamically significant stenosis or occlusion. The internal and external carotid arteries are patent. There is no evidence of dissection or aneurysm/pseudoaneurysm. Vertebral arteries: The vertebral arteries are patent, without hemodynamically significant stenosis or occlusion. There is no evidence of dissection or aneurysm/pseudoaneurysm. Skeleton: There is no acute osseous abnormality or suspicious osseous lesion. There is no visible canal hematoma. Other neck: There is extensive dental disease. The soft tissues of the neck are unremarkable. Upper chest: There is emphysema in the lung apices. Review of the MIP images confirms the above findings CTA HEAD FINDINGS Anterior circulation: There is calcified plaque in the intracranial ICAs without significant stenosis or occlusion. The right M1 segment is patent. The branch vessels are patent, without proximal stenosis or occlusion. The left MCA is patent, without proximal stenosis or occlusion. The bilateral ACAs are patent, without proximal stenosis or occlusion. There is no aneurysm or AVM. Posterior circulation: The bilateral V4 segments are patent. The basilar artery is patent. The major cerebellar arteries appear patent. The bilateral PCAs are patent, without proximal stenosis or occlusion. There is a fetal origin of the right PCA. A left posterior communicating artery is also identified. There is no  aneurysm or AVM. Venous sinuses: Patent. Anatomic variants: As above. Review of the MIP images confirms the above findings IMPRESSION: 1. No acute intracranial pathology.  No evidence of evolved infarct. 2. Mural adherent clot in the right internal carotid artery after the bifurcation resulting in high-grade stenosis, estimated at 80%. 3. More proximally in the right internal carotid artery just after the bifurcation, mixed plaque results in proximally 60% stenosis. 4. Mild plaque at the left carotid bifurcation without significant stenosis. Patent vertebral arteries in the neck. 5. Patent intracranial vasculature. 6. Emphysema. Findings of the noncontrast head CT were communicated to Dr Roda Shutters at 11:32 am. The the CTA results were discussed at 11:43 am.  Electronically Signed   By: Lesia Hausen M.D.   On: 01/27/2023 11:57   CT ANGIO HEAD NECK W WO CM (CODE STROKE)  Result Date: 01/27/2023 CLINICAL DATA:  Code stroke.  Left-sided weakness, right-sided gaze. EXAM: CT ANGIOGRAPHY HEAD AND NECK TECHNIQUE: Multidetector CT imaging of the head and neck was performed using the standard protocol during bolus administration of intravenous contrast. Multiplanar CT image reconstructions and MIPs were obtained to evaluate the vascular anatomy. Carotid stenosis measurements (when applicable) are obtained utilizing NASCET criteria, using the distal internal carotid diameter as the denominator. RADIATION DOSE REDUCTION: This exam was performed according to the departmental dose-optimization program which includes automated exposure control, adjustment of the mA and/or kV according to patient size and/or use of iterative reconstruction technique. CONTRAST:  75 cc Omnipaque 350 COMPARISON:  CT head 09/29/2021 FINDINGS: CT HEAD FINDINGS Brain: There is no evidence of acute intracranial hemorrhage, extra-axial fluid collection, or acute territorial infarct. Parenchymal volume is normal. The ventricles are normal in size. Gray-white  differentiation is preserved. There is patchy hypodensity in the supratentorial white matter likely reflecting sequela of advanced chronic small-vessel ischemic change. Pituitary and suprasellar region are normal. There is no mass lesion there is no mass effect or midline shift. Vascular: No hyperdense vessel is seen. Skull: Normal. Negative for fracture or focal lesion. Sinuses/Orbits: The paranasal sinuses are clear. The globes and orbits are unremarkable. Other: None. ASPECTS [facility] Stroke Program Early CT Score) - Ganglionic level infarction (caudate, lentiform nuclei, internal capsule, insula, M1-M3 cortex): 7 - Supraganglionic infarction (M4-M6 cortex): 3 Total score (0-10 with 10 being normal): 10 CTA NECK FINDINGS Aortic arch: There is mild calcified plaque in the imaged aortic arch. The origins of the major branch vessels are patent. The subclavian arteries are patent to the level imaged. Right carotid system: The right common carotid artery is patent. There is mixed plaque of the bifurcation with noncalcified plaque projecting into the lumen resulting in hemodynamically significant stenosis just after the bifurcation, estimated at 60%. Distally, there is additional mural adherent clot in the lumen of the ICA resulting in high-grade stenosis, estimated at 80%. There is no raised dissection flap. The distal internal carotid artery is patent. The external carotid artery is patent. There is no evidence of aneurysm/pseudoaneurysm. Left carotid system: The left common carotid artery is patent. There is calcified plaque at the bifurcation without hemodynamically significant stenosis or occlusion. The internal and external carotid arteries are patent. There is no evidence of dissection or aneurysm/pseudoaneurysm. Vertebral arteries: The vertebral arteries are patent, without hemodynamically significant stenosis or occlusion. There is no evidence of dissection or aneurysm/pseudoaneurysm. Skeleton: There is no  acute osseous abnormality or suspicious osseous lesion. There is no visible canal hematoma. Other neck: There is extensive dental disease. The soft tissues of the neck are unremarkable. Upper chest: There is emphysema in the lung apices. Review of the MIP images confirms the above findings CTA HEAD FINDINGS Anterior circulation: There is calcified plaque in the intracranial ICAs without significant stenosis or occlusion. The right M1 segment is patent. The branch vessels are patent, without proximal stenosis or occlusion. The left MCA is patent, without proximal stenosis or occlusion. The bilateral ACAs are patent, without proximal stenosis or occlusion. There is no aneurysm or AVM. Posterior circulation: The bilateral V4 segments are patent. The basilar artery is patent. The major cerebellar arteries appear patent. The bilateral PCAs are patent, without proximal stenosis or occlusion. There is a fetal origin of the right PCA.  A left posterior communicating artery is also identified. There is no aneurysm or AVM. Venous sinuses: Patent. Anatomic variants: As above. Review of the MIP images confirms the above findings IMPRESSION: 1. No acute intracranial pathology.  No evidence of evolved infarct. 2. Mural adherent clot in the right internal carotid artery after the bifurcation resulting in high-grade stenosis, estimated at 80%. 3. More proximally in the right internal carotid artery just after the bifurcation, mixed plaque results in proximally 60% stenosis. 4. Mild plaque at the left carotid bifurcation without significant stenosis. Patent vertebral arteries in the neck. 5. Patent intracranial vasculature. 6. Emphysema. Findings of the noncontrast head CT were communicated to Dr Roda Shutters at 11:32 am. The the CTA results were discussed at 11:43 am. Electronically Signed   By: Lesia Hausen M.D.   On: 01/27/2023 11:57     PHYSICAL EXAM  Temp:  [98.1 F (36.7 C)-98.9 F (37.2 C)] 98.2 F (36.8 C) (06/02 0726) Pulse  Rate:  [70-81] 71 (06/02 0726) Resp:  [14-19] 18 (06/02 0726) BP: (122-149)/(70-87) 131/71 (06/02 0726) SpO2:  [96 %-100 %] 97 % (06/02 0726)  General - Well nourished, well developed, in no apparent distress.  Cardiovascular - Regular rhythm and rate.  Neuro - awake, alert, eyes open. A/O x 3. following all simple commands. Able to name and repeat, mild dysarthria. Right gaze preference but able to have left gaze, left lower quadrantanopia. Left mild facial droop. Tongue midline.   Motor/Sensory: LUE 4/5 proximal and distally, BLEs and RUE 5/5. Sensation symmetrical bilaterally however with LUE simultagnosia,     ASSESSMENT/PLAN Mary [name] is a 72 y.o. female with history of PAD, CAD, smoker, HTN, HLD, on chronic coumadin presented for left sided weakness, right forced gaze and left neglect. NIHSS = 14. CT no acute finding. INR 2.4. CTA showed right carotid mural thrombus with high grade stenosis. Not TNK candidate due to high INR. Given severe neuro deficit and right ICA flow limiting on imaging, discussed with Dr. Corliss Skains, activated code IR.    Stroke:  right MCA and MCA/ACA watershed zone given a mixed etiology of scattered LAA embolic infarct phenomena plus cortical watershed hypoperfusion from the intramural right ICA thrombus, secondary to ? Coagulation disorder ? Even on coumadin with therapeutic INR CT no acute finding CTA showed right carotid mural thrombus with high grade stenosis 80%. More proximally in the right internal carotid artery just after the bifurcation, mixed plaque results in proximally 60% stenosis. MRI  Scattered acute infarcts throughout the right cerebral hemisphere, with more focal areas of infarction in the right frontal and parietal cortex, watershed territory, right caudate and lentiform nucleus, and right occipital lobe. Carotid Doppler 1 to 39% bilaterally 2D Echo EF 55 to 60% LDL 53 HgbA1c 6.2 Coumadin for VTE prophylaxis warfarin daily prior to  admission, now on aspirin 325 mg daily and warfarin daily, will need to increase the goal INR to 2.5-3.5. once INR at goal, ASA can be discontinued. Patient counseled to be compliant with her antithrombotic medications Ongoing aggressive stroke risk factor management Therapy recommendations: CIR Disposition:  pending  ? Coagulation disorder On Coumadin at home PTA INR 2.4->2.2->2.1-> 2.7 Given developed carotid intramural thrombus in the setting of therapeutic INR, will increase INR goal to 2.5-3.5. Asa 325mg  daily until INR steady at goal INR 2.7 today, if remains in appropriate range plan to d/c ASA tomorrow  Hypertension Stable Off Cleviprex BP goal less than 180/105 Long term BP goal normotensive  Hyperlipidemia Home  meds: Lipitor 10 LDL 53, goal < 70 Now on Lipitor 10 No high intensity statin given LDL within goal Continue statin at discharge  Tobacco abuse Current smoker Smoking cessation counseling provided Nicotine patch provided Pt is willing to quit  Other Stroke Risk Factors Advanced age PAD CAD  Other Active Problems Mild hyponatremia, sodium 134  Hospital day # 4   J. Jefferson Fuel, PA-C 10:36 AM  ATTENDING ATTESTATION:  Right carotid thrombus s/p IR. Doing well. INR today is 2.7.  stop aspirin tomorrow if INR is stable. Asked PT to re evaluate today and still recommend CIR.   Dr. Viviann Spare evaluated pt independently, reviewed imaging, chart, labs. Discussed and formulated plan with the Resident/APP. Changes were made to the note where appropriate. Please see APP/resident note above for details.     Linh Hedberg,MD     To contact Stroke Continuity provider, please refer to [email or web address]. After hours, contact General Neurology

## 2023-01-31 NOTE — Progress Notes (Signed)
Physical Therapy Treatment Patient Details Name: Mary [name] MRN: [id number] DOB: June 05, 1951 Today's Date: 01/31/2023   History of Present Illness 72 yo female presents to [facility] on 5/29 with L weakness, R gaze. CTA showed right carotid mural thrombus with high grade stenosis, s/p s/p complete revascularization of R ICA on 5/29. MRI brain shows Scattered acute infarcts throughout the right cerebral hemisphere,  with more focal areas of infarction in the right frontal and parietal cortex, watershed territory, right caudate and lentiform nucleus, and right occipital lobe. PMH includes  PAD, HTN, HLD, tobacco abuse, CAD, osteoporosis.    PT Comments    Pt progressing steadily towards her physical therapy goals and is eager to participate. Session focused on progressive ambulation, increasing/promoting left sided attention and stair training. Pt overall requiring min assist for functional mobility. Tendency to run into obstacles on left consistently with decreased awareness of L side of environment without consistent cueing and hands on guarding. Would continue to benefit from intensive post acute rehabilitation to address deficits, maximize functional mobility and decrease caregiver burden.    Recommendations for follow up therapy are one component of a multi-disciplinary discharge planning process, led by the attending physician.  Recommendations may be updated based on patient status, additional functional criteria and insurance authorization.  Follow Up Recommendations       Assistance Recommended at Discharge Frequent or constant Supervision/Assistance  Patient can return home with the following A little help with walking and/or transfers;A little help with bathing/dressing/bathroom;Assistance with cooking/housework;Direct supervision/assist for medications management;Direct supervision/assist for financial management;Assist for transportation;Help with stairs or ramp for entrance   Equipment  Recommendations  None recommended by PT    Recommendations for Other Services       Precautions / Restrictions Precautions Precautions: Fall;Other (comment) Precaution Comments: L inattention Restrictions Weight Bearing Restrictions: No     Mobility  Bed Mobility Overal bed mobility: Needs Assistance Bed Mobility: Sit to Supine       Sit to supine: Supervision   General bed mobility comments: Increased time    Transfers Overall transfer level: Needs assistance Equipment used: None, Rolling walker (2 wheels) Transfers: Sit to/from Stand Sit to Stand: Min guard           General transfer comment: Slow to rise, reaching out for external support    Ambulation/Gait Ambulation/Gait assistance: Min guard, Min assist Gait Distance (Feet): 100 Feet (100", 100") Assistive device: Rolling walker (2 wheels), None Gait Pattern/deviations: Step-through pattern, Decreased stride length, Drifts right/left, Narrow base of support Gait velocity: decreased Gait velocity interpretation: <1.8 ft/sec, indicate of risk for recurrent falls   General Gait Details: Pt initially ambulated short distance with no assistive device, reaching out for additional external support, with short, shuffling steps. Improved fluidity of gait and speed with use of walker. Requires intermittent assist for steering and environment/obstacle navigation. Left drift   Stairs Stairs: Yes Stairs assistance: Min guard Stair Management: One rail Left, One rail Right Number of Stairs: 5 General stair comments: Cues for attending to where LUE is on railing (tendency to leave it behind her)   Wheelchair Mobility    Modified Rankin (Stroke Patients Only) Modified Rankin (Stroke Patients Only) Pre-Morbid Rankin Score: No symptoms Modified Rankin: Moderately severe disability     Balance Overall balance assessment: Needs assistance, History of Falls Sitting-balance support: No upper extremity supported,  Feet supported Sitting balance-Leahy Scale: Fair     Standing balance support: No upper extremity supported, During functional activity Standing balance-Leahy Scale:  Poor Standing balance comment: reliant on external support                            Cognition Arousal/Alertness: Awake/alert Behavior During Therapy: WFL for tasks assessed/performed Overall Cognitive Status: Impaired/Different from baseline Area of Impairment: Following commands, Safety/judgement, Awareness                       Following Commands: Follows one step commands with increased time Safety/Judgement: Decreased awareness of deficits, Decreased awareness of safety Awareness: Emergent   General Comments: L inattention. Pt L sleeve out of gown upon entrance; running into objects frequently on left, requires cues to correct        Exercises      General Comments        Pertinent Vitals/Pain Pain Assessment Pain Assessment: No/denies pain    Home Living                          Prior Function            PT Goals (current goals can now be found in the care plan section) Acute Rehab PT Goals Patient Stated Goal: back to independence Potential to Achieve Goals: Good Progress towards PT goals: Progressing toward goals    Frequency    Min 4X/week      PT Plan Current plan remains appropriate    Co-evaluation              AM-PAC PT "6 Clicks" Mobility   Outcome Measure  Help needed turning from your back to your side while in a flat bed without using bedrails?: A Little Help needed moving from lying on your back to sitting on the side of a flat bed without using bedrails?: A Little Help needed moving to and from a bed to a chair (including a wheelchair)?: A Little Help needed standing up from a chair using your arms (e.g., wheelchair or bedside chair)?: A Little Help needed to walk in hospital room?: A Little Help needed climbing 3-5 steps with a  railing? : A Little 6 Click Score: 18    End of Session Equipment Utilized During Treatment: Gait belt Activity Tolerance: Patient tolerated treatment well Patient left: with call bell/phone within reach;in bed;with bed alarm set Nurse Communication: Mobility status PT Visit Diagnosis: Other abnormalities of gait and mobility (R26.89);History of falling (Z91.81)     Time: 6440-3474 PT Time Calculation (min) (ACUTE ONLY): 41 min  Charges:  $Therapeutic Activity: 38-52 mins                     Lillia Pauls, PT, DPT Acute Rehabilitation Services Office [phone]    Norval Morton 01/31/2023, 3:26 PM

## 2023-01-31 NOTE — Progress Notes (Addendum)
ANTICOAGULATION CONSULT NOTE  Pharmacy Consult:  Coumadin Indication:  PVD  No Known Allergies  Patient Measurements: Height: 5\' 4"  (162.6 cm) Weight: 51.1 kg (112 lb 10.5 oz) IBW/kg (Calculated) : 54.7  Vital Signs: Temp: 98.2 F (36.8 C) (06/02 0726) Temp Source: Oral (06/02 0726) BP: 131/71 (06/02 0726) Pulse Rate: 71 (06/02 0726)  Labs: Recent Labs    01/29/23 0418 01/30/23 0249 01/31/23 0353  LABPROT 23.9* 25.8* 29.1*  INR 2.1* 2.3* 2.7*     Estimated Creatinine Clearance: 53.5 mL/min (by C-G formula based on SCr of 0.75 mg/dL).   Medical History: Past Medical History:  Diagnosis Date   Arthritis    Hyperlipidemia    Hypertension    Hyperuricemia    Osteoporosis    PAD (peripheral artery disease) (HCC) 02/2010   angiplasty, popliteal artery   Tobacco abuse     Assessment: 72 yo F on Coumadin 5mg  PO daily except 3mg  one day per week PTA for history of PVD.  INR was therapeutic at 2.4 and patient presented with scattered infarcts.  INR goal increased to 2.5-3.5 per Neuro.  No hemorrhage and Pharmacy consulted to resume Coumadin.  ASA also started.  INR today now therapeutic at 2.7; no bleeding reported.  Goal of Therapy:  INR goal 2.5-3.5 per Neuro Monitor platelets by anticoagulation protocol: Yes   Plan:  Warfarin 5 mg x 1 today Daily PT / INR Confirmed with neurology - continue aspirin 325 mg daily until INR is stable therapeutic  Larena Sox, PharmD PGY1 Pharmacy Resident   01/31/2023  8:08 AM

## 2023-01-31 NOTE — Plan of Care (Signed)
  Problem: Education: Goal: Knowledge of disease or condition will improve Outcome: Progressing   Problem: Ischemic Stroke/TIA Tissue Perfusion: Goal: Complications of ischemic stroke/TIA will be minimized Outcome: Progressing   

## 2023-02-01 DIAGNOSIS — I6300 Cerebral infarction due to thrombosis of unspecified precerebral artery: Secondary | ICD-10-CM

## 2023-02-01 LAB — PROTIME-INR
INR: 2.9 — ABNORMAL HIGH (ref 0.8–1.2)
Prothrombin Time: 30.3 seconds — ABNORMAL HIGH (ref 11.4–15.2)

## 2023-02-01 MED ORDER — CLOPIDOGREL BISULFATE 75 MG PO TABS
75.0000 mg | ORAL_TABLET | Freq: Every day | ORAL | Status: DC
  Administered 2023-02-02: 75 mg via ORAL
  Filled 2023-02-01: qty 1

## 2023-02-01 MED ORDER — WARFARIN SODIUM 5 MG PO TABS: 5.0000 mg | ORAL_TABLET | Freq: Every day | ORAL | Status: DC

## 2023-02-01 MED ORDER — ASPIRIN 81 MG PO CHEW
81.0000 mg | CHEWABLE_TABLET | Freq: Every day | ORAL | Status: DC
  Administered 2023-02-02: 81 mg via ORAL
  Filled 2023-02-01: qty 1

## 2023-02-01 NOTE — Progress Notes (Addendum)
STROKE TEAM PROGRESS NOTE   SUBJECTIVE (INTERVAL HISTORY) Patient is seen in her room with no family at the bedside.  She remains hemodynamically stable, and her neurological exam is stable.  Apparently, she was started on Coumadin by vascular surgery for her peripheral arterial disease.  She has a history of femoral to popliteal stents, but those are occluded, and patient has not been seen by vascular surgery since 2021.  Will stop Coumadin and start patient on DAPT.  Patient has no prior history of atrial fibrillation, DVT, pulmonary embolism or any hypercoagulable disorder.  She has not been seen by hematologist and Coumadin is only started for peripheral vascular disease and vascular surgeon in [place]  OBJECTIVE Temp:  [98 F (36.7 C)-98.8 F (37.1 C)] 98 F (36.7 C) (06/03 1115) Pulse Rate:  [71-87] 71 (06/03 1115) Cardiac Rhythm: Normal sinus rhythm (06/03 0800) Resp:  [16-18] 18 (06/03 1115) BP: (112-151)/(67-101) 139/70 (06/03 1115) SpO2:  [95 %-100 %] 98 % (06/03 1115)  Recent Labs  Lab 01/27/23 1909 01/27/23 2344 01/28/23 0307 01/28/23 0714 01/28/23 1218  GLUCAP 126* 122* 106* 82 105*    Recent Labs  Lab 01/27/23 1126 01/27/23 1128 01/28/23 0250  NA 136 139 134*  K 3.0* 2.9* 3.5  CL 101 99 101  CO2 25  --  23  GLUCOSE 127* 121* 113*  BUN 9 10 8   CREATININE 0.89 0.80 0.75  CALCIUM 9.0  --  8.4*    Recent Labs  Lab 01/27/23 1126  AST 24  ALT 16  ALKPHOS 70  BILITOT 0.4  PROT 7.4  ALBUMIN 3.8    Recent Labs  Lab 01/27/23 1126 01/27/23 1128 01/28/23 0250  WBC 9.5  --  6.5  NEUTROABS 1.8  --  4.4  HGB 13.1 13.9 11.9*  HCT 39.3 41.0 35.0*  MCV 87.9  --  86.2  PLT 243  --  201    No results for input(s): "CKTOTAL", "CKMB", "CKMBINDEX", "TROPONINI" in the last 168 hours. Recent Labs    01/30/23 0249 01/31/23 0353 02/01/23 0749  LABPROT 25.8* 29.1* 30.3*  INR 2.3* 2.7* 2.9*    No results for input(s): "COLORURINE", "LABSPEC", "PHURINE",  "GLUCOSEU", "HGBUR", "BILIRUBINUR", "KETONESUR", "PROTEINUR", "UROBILINOGEN", "NITRITE", "LEUKOCYTESUR" in the last 72 hours.  Invalid input(s): "APPERANCEUR"     Component Value Date/Time   CHOL 115 01/28/2023 0250   CHOL 145 10/17/2015 1017   TRIG 57 01/28/2023 0250   HDL 51 01/28/2023 0250   HDL 43 10/17/2015 1017   CHOLHDL 2.3 01/28/2023 0250   VLDL 11 01/28/2023 0250   LDLCALC 53 01/28/2023 0250   LDLCALC 55 10/06/2022 1038   Lab Results  Component Value Date   HGBA1C 6.2 (H) 01/28/2023   No results found for: "LABOPIA", "COCAINSCRNUR", "LABBENZ", "AMPHETMU", "THCU", "LABBARB"  Recent Labs  Lab 01/27/23 1126  ETH <10     I have personally reviewed the radiological images below and agree with the radiology interpretations.  VAS US CAROTID  Result Date: 01/29/2023 Carotid Arterial Duplex Study Patient Name:  Mary [name]  Date of Exam:   01/28/2023 Medical Rec #: [id number]      Accession #:    [id number] Date of Birth: 20-Jan-1951       Patient Gender: F Patient Age:   38 years Exam Location:  [facility] Procedure:      VAS US CAROTID Referring Phys: Scheryl Marten XU --------------------------------------------------------------------------------  Indications:       CVA, Weakness and right carotid mural  thrombus, status post                    thrombectomy with residual 55-60% ICA stenosis. Risk Factors:      Hypertension, hyperlipidemia, current smoker, coronary artery                    disease, PAD. Other Factors:     On Coumadin for clotting disorder. Comparison Study:  No prior study Performing Technologist: Sherren Kerns RVS  Examination Guidelines: A complete evaluation includes B-mode imaging, spectral Doppler, color Doppler, and power Doppler as needed of all accessible portions of each vessel. Bilateral testing is considered an integral part of a complete examination. Limited examinations for reoccurring indications may be performed as noted.  Right Carotid Findings:  +----------+--------+--------+--------+------------------+------------------+           PSV cm/sEDV cm/sStenosisPlaque DescriptionComments           +----------+--------+--------+--------+------------------+------------------+ CCA Prox  89      17                                intimal thickening +----------+--------+--------+--------+------------------+------------------+ CCA Mid                           focal and calcific                   +----------+--------+--------+--------+------------------+------------------+ CCA Distal82      17                                intimal thickening +----------+--------+--------+--------+------------------+------------------+ ICA Prox  77      20      1-39%   calcific          Shadowing          +----------+--------+--------+--------+------------------+------------------+ ICA Mid   108     20                                                   +----------+--------+--------+--------+------------------+------------------+ ICA Distal91      27                                                   +----------+--------+--------+--------+------------------+------------------+ ECA       117     11                                                   +----------+--------+--------+--------+------------------+------------------+ +----------+--------+-------+--------+-------------------+           PSV cm/sEDV cmsDescribeArm Pressure (mmHG) +----------+--------+-------+--------+-------------------+ Subclavian70                                         +----------+--------+-------+--------+-------------------+ +---------+--------+--+--------+--+ VertebralPSV cm/s70EDV cm/s14 +---------+--------+--+--------+--+  Left Carotid Findings: +----------+--------+--------+--------+------------------+------------------+           PSV  cm/sEDV cm/sStenosisPlaque DescriptionComments            +----------+--------+--------+--------+------------------+------------------+ CCA Prox  85      21                                intimal thickening +----------+--------+--------+--------+------------------+------------------+ CCA Distal81      16                                intimal thickening +----------+--------+--------+--------+------------------+------------------+ ICA Prox  82      23      1-39%   calcific                             +----------+--------+--------+--------+------------------+------------------+ ICA Mid   77      21                                                   +----------+--------+--------+--------+------------------+------------------+ ICA Distal75      23                                                   +----------+--------+--------+--------+------------------+------------------+ ECA       77      12                                                   +----------+--------+--------+--------+------------------+------------------+ +----------+--------+--------+--------+-------------------+           PSV cm/sEDV cm/sDescribeArm Pressure (mmHG) +----------+--------+--------+--------+-------------------+ [id number]                                          +----------+--------+--------+--------+-------------------+ +---------+--------+--+--------+--+ VertebralPSV cm/s72EDV cm/s16 +---------+--------+--+--------+--+   Summary: Right Carotid: Velocities in the right ICA are consistent with a 1-39% stenosis. Left Carotid: Velocities in the left ICA are consistent with a 1-39% stenosis. Vertebrals:  Bilateral vertebral arteries demonstrate antegrade flow. Subclavians: Normal flow hemodynamics were seen in bilateral subclavian              arteries. *See table(s) above for measurements and observations.  Electronically signed by Delia Heady MD on 01/29/2023 at 9:23:08 AM.    Final    IR PERCUTANEOUS ART THROMBECTOMY/INFUSION  INTRACRANIAL INC DIAG ANGIO  Result Date: 01/29/2023 INDICATION: New onset right gaze deviation and left-sided neglect, and left hemiparesis. Near occlusive prominent thrombus in the right internal carotid artery. EXAM: 1. EMERGENT LARGE VESSEL OCCLUSION THROMBOLYSIS (anterior CIRCULATION) COMPARISON:  CT angiogram of the head and neck of Jan 27, 2023. MEDICATIONS: Ancef 2 g IV antibiotic was administered within 1 hour of the procedure. ANESTHESIA/SEDATION: General anesthesia. CONTRAST:  Omnipaque 300 approximately 80 mL. FLUOROSCOPY TIME:  Fluoroscopy Time: 17 minutes 24 seconds (947 mGy). COMPLICATIONS: None immediate. TECHNIQUE: Following a full explanation of the procedure along with the potential associated complications, an informed witnessed consent was  obtained. The risks of intracranial hemorrhage of 10%, worsening neurological deficit, ventilator dependency, death and inability to revascularize were all reviewed in detail with the patient. The patient was then put under general anesthesia by the Department of Anesthesiology at [facility] The right groin was prepped and draped in the usual sterile fashion. Thereafter using modified Seldinger technique, transfemoral access into the right common femoral artery was obtained without difficulty. Over an 0.035 inch guidewire an 8 French 25 cm Arrow sheath was inserted. Through this, and also over an 0.035 inch guidewire a combination of a Berenstein 125 cm 5.5 [place] support catheter inside of an 087 95 cm balloon guide catheter was advanced to the right common carotid artery. The guidewire, and the support catheter were removed. Arteriograms were then performed through the balloon guide catheter extra cranially and intracranially. FINDINGS: The right common carotid bifurcation demonstrates the right external carotid artery and its major branches to be widely patent. The right internal carotid artery at the bulb has a circumferential atherosclerotic  plaque associated with an approximately 3 cm irregular filling defect nearly occluding the internal carotid artery. More distally, patency is maintained of the distal cervical right ICA, the petrous, the cavernous and the supraclinoid right ICA. A right posterior communicating artery is seen opacifying the right posterior cerebral artery distribution. The right middle cerebral artery opacifies into the capillary and venous phases. A hypoplastic right A1 segment is noted with continuation into the A2 region. PROCEDURE: Through the balloon guide catheter, an 021 150 cm microcatheter was advanced over an 014 inch soft tip micro guidewire with a moderate J configuration to just proximal to the right internal carotid artery origin. At this time, proximal flow arrest was initiated by inflating the balloon guide catheter in the distal right common carotid artery. Also a combination of the micro guidewire and microcatheter was advanced through the internal carotid artery, slight proximal aspiration was applied with a 20 mL syringe as the combination was navigated to the petrous segment of the right ICA. The aspiration was then stopped. The micro guidewire was removed. A 6.5 mm x 47 mm Embotrap retrieved retrieval device was then advanced to the distal end of the microcatheter and deployed such that the proximal portion of the device was in the distal cervical right ICA. The microcatheter was removed. With the proximal flow arrest intact, proximal aspiration was then performed at the hub of the balloon guide catheter with a Penumbra aspiration device, as the retriever was removed. An 071 Zoom aspiration catheter was then advanced over an 014 microcatheter as aspiration was performed. The 071 Zoom aspiration catheter was advanced into the right internal carotid artery proximally and distally and then distal to proximal flow arrest still intact. Thick irregular grayish white clot fragments were retained in the retriever, and  in the canister. Following reversal of flow arrest in the right common carotid artery, a control arteriogram performed through the balloon guide catheter revealed no residual filling defects in the right internal carotid artery extra cranially and intracranially. Right MCA remained widely patent without evidence of intraluminal filling defects, or of occlusions. The non dominant right A1 and A2 segments also demonstrated no changes. The balloon guide wire was removed. Manual compression was applied with quick clot for 25 minutes to obtain hemostasis at the right groin puncture site. Distal pulses remained Dopplerable in both feet unchanged at the end of procedure. A flat panel CT of the brain demonstrated no evidence of intracranial hemorrhage. The patient's general  anesthesia was then reversed and the patient was extubated. Upon recovery, the patient was able to obey simple commands though had no movement in her left arm and leg. She was then transferred to the neuro ICU for post revascularization care. IMPRESSION: Status post right ICA extra cranially with 1 pass with a 6.5 mm x 47 mm Emboshield retrieval device and aspiration achieving complete revascularization. Approximately 65% stenosis of the right internal carotid artery at the bulb. PLAN: We will probably need placement of a stent at the right internal carotid artery proximally to prevent future ischemic strokes in the right MCA distribution. Electronically Signed   By: Julieanne Cotton M.D.   On: 01/29/2023 08:42   IR CT Head Ltd  Result Date: 01/29/2023 INDICATION: New onset right gaze deviation and left-sided neglect, and left hemiparesis. Near occlusive prominent thrombus in the right internal carotid artery. EXAM: 1. EMERGENT LARGE VESSEL OCCLUSION THROMBOLYSIS (anterior CIRCULATION) COMPARISON:  CT angiogram of the head and neck of Jan 27, 2023. MEDICATIONS: Ancef 2 g IV antibiotic was administered within 1 hour of the procedure.  ANESTHESIA/SEDATION: General anesthesia. CONTRAST:  Omnipaque 300 approximately 80 mL. FLUOROSCOPY TIME:  Fluoroscopy Time: 17 minutes 24 seconds (947 mGy). COMPLICATIONS: None immediate. TECHNIQUE: Following a full explanation of the procedure along with the potential associated complications, an informed witnessed consent was obtained. The risks of intracranial hemorrhage of 10%, worsening neurological deficit, ventilator dependency, death and inability to revascularize were all reviewed in detail with the patient. The patient was then put under general anesthesia by the Department of Anesthesiology at [facility]. The right groin was prepped and draped in the usual sterile fashion. Thereafter using modified Seldinger technique, transfemoral access into the right common femoral artery was obtained without difficulty. Over an 0.035 inch guidewire an 8 French 25 cm Arrow sheath was inserted. Through this, and also over an 0.035 inch guidewire a combination of a Berenstein 125 cm 5.5 [place] support catheter inside of an 087 95 cm balloon guide catheter was advanced to the right common carotid artery. The guidewire, and the support catheter were removed. Arteriograms were then performed through the balloon guide catheter extra cranially and intracranially. FINDINGS: The right common carotid bifurcation demonstrates the right external carotid artery and its major branches to be widely patent. The right internal carotid artery at the bulb has a circumferential atherosclerotic plaque associated with an approximately 3 cm irregular filling defect nearly occluding the internal carotid artery. More distally, patency is maintained of the distal cervical right ICA, the petrous, the cavernous and the supraclinoid right ICA. A right posterior communicating artery is seen opacifying the right posterior cerebral artery distribution. The right middle cerebral artery opacifies into the capillary and venous phases. A  hypoplastic right A1 segment is noted with continuation into the A2 region. PROCEDURE: Through the balloon guide catheter, an 021 150 cm microcatheter was advanced over an 014 inch soft tip micro guidewire with a moderate J configuration to just proximal to the right internal carotid artery origin. At this time, proximal flow arrest was initiated by inflating the balloon guide catheter in the distal right common carotid artery. Also a combination of the micro guidewire and microcatheter was advanced through the internal carotid artery, slight proximal aspiration was applied with a 20 mL syringe as the combination was navigated to the petrous segment of the right ICA. The aspiration was then stopped. The micro guidewire was removed. A 6.5 mm x 47 mm Embotrap retrieved retrieval device was then  advanced to the distal end of the microcatheter and deployed such that the proximal portion of the device was in the distal cervical right ICA. The microcatheter was removed. With the proximal flow arrest intact, proximal aspiration was then performed at the hub of the balloon guide catheter with a Penumbra aspiration device, as the retriever was removed. An 071 Zoom aspiration catheter was then advanced over an 014 microcatheter as aspiration was performed. The 071 Zoom aspiration catheter was advanced into the right internal carotid artery proximally and distally and then distal to proximal flow arrest still intact. Thick irregular grayish white clot fragments were retained in the retriever, and in the canister. Following reversal of flow arrest in the right common carotid artery, a control arteriogram performed through the balloon guide catheter revealed no residual filling defects in the right internal carotid artery extra cranially and intracranially. Right MCA remained widely patent without evidence of intraluminal filling defects, or of occlusions. The non dominant right A1 and A2 segments also demonstrated no changes.  The balloon guide wire was removed. Manual compression was applied with quick clot for 25 minutes to obtain hemostasis at the right groin puncture site. Distal pulses remained Dopplerable in both feet unchanged at the end of procedure. A flat panel CT of the brain demonstrated no evidence of intracranial hemorrhage. The patient's general anesthesia was then reversed and the patient was extubated. Upon recovery, the patient was able to obey simple commands though had no movement in her left arm and leg. She was then transferred to the neuro ICU for post revascularization care. IMPRESSION: Status post right ICA extra cranially with 1 pass with a 6.5 mm x 47 mm Emboshield retrieval device and aspiration achieving complete revascularization. Approximately 65% stenosis of the right internal carotid artery at the bulb. PLAN: We will probably need placement of a stent at the right internal carotid artery proximally to prevent future ischemic strokes in the right MCA distribution. Electronically Signed   By: Julieanne Cotton M.D.   On: 01/29/2023 08:42   ECHOCARDIOGRAM COMPLETE  Result Date: 01/28/2023    ECHOCARDIOGRAM REPORT   Patient Name:   Mary [name] Date of Exam: 01/28/2023 Medical Rec #:  [id number]     Height:       64.0 in Accession #:    [id number]    Weight:       112.7 lb Date of Birth:  1950-11-04      BSA:          1.533 m Patient Age:    72 years      BP:           124/71 mmHg Patient Gender: F             HR:           68 bpm. Exam Location:  Inpatient Procedure: 2D Echo, Color Doppler and Cardiac Doppler Indications:    Stroke  History:        Patient has prior history of Echocardiogram examinations and                 Patient has no prior history of Echocardiogram examinations.                 Right ICA occlusion, PAD, Smoker, HTN, HLD, CAD.  Sonographer:    Milbert Coulter Referring Phys: [id number] JINDONG XU IMPRESSIONS  1. Left ventricular ejection fraction, by estimation, is 55 to 60%. The left  ventricle has normal function. The  left ventricle has no regional wall motion abnormalities. Left ventricular diastolic parameters are consistent with Grade I diastolic dysfunction (impaired relaxation).  2. Right ventricular systolic function is normal. The right ventricular size is normal. Tricuspid regurgitation signal is inadequate for assessing PA pressure.  3. The mitral valve is grossly normal. Trivial mitral valve regurgitation.  4. The aortic valve is tricuspid. There is mild calcification of the aortic valve. There is mild thickening of the aortic valve. Aortic valve regurgitation is not visualized. Aortic valve sclerosis/calcification is present, without any evidence of aortic stenosis.  5. The inferior vena cava is normal in size with greater than 50% respiratory variability, suggesting right atrial pressure of 3 mmHg. Comparison(s): No prior Echocardiogram. Conclusion(s)/Recommendation(s): No intracardiac source of embolism detected on this transthoracic study. Consider a transesophageal echocardiogram to exclude cardiac source of embolism if clinically indicated. FINDINGS  Left Ventricle: Left ventricular ejection fraction, by estimation, is 55 to 60%. The left ventricle has normal function. The left ventricle has no regional wall motion abnormalities. The left ventricular internal cavity size was normal in size. There is  no left ventricular hypertrophy. Left ventricular diastolic parameters are consistent with Grade I diastolic dysfunction (impaired relaxation). Right Ventricle: The right ventricular size is normal. No increase in right ventricular wall thickness. Right ventricular systolic function is normal. Tricuspid regurgitation signal is inadequate for assessing PA pressure. Left Atrium: Left atrial size was normal in size. Right Atrium: Right atrial size was normal in size. Pericardium: There is no evidence of pericardial effusion. Mitral Valve: The mitral valve is grossly normal. Trivial  mitral valve regurgitation. Tricuspid Valve: The tricuspid valve is normal in structure. Tricuspid valve regurgitation is trivial. Aortic Valve: The aortic valve is tricuspid. There is mild calcification of the aortic valve. There is mild thickening of the aortic valve. Aortic valve regurgitation is not visualized. Aortic valve sclerosis/calcification is present, without any evidence of aortic stenosis. Aortic valve mean gradient measures 4.0 mmHg. Aortic valve peak gradient measures 7.3 mmHg. Aortic valve area, by VTI measures 1.23 cm. Pulmonic Valve: The pulmonic valve was not well visualized. Pulmonic valve regurgitation is trivial. Aorta: The aortic root is normal in size and structure. Venous: The inferior vena cava is normal in size with greater than 50% respiratory variability, suggesting right atrial pressure of 3 mmHg. IAS/Shunts: The atrial septum is grossly normal.  LEFT VENTRICLE PLAX 2D LVIDd:         3.60 cm     Diastology LVIDs:         2.50 cm     LV e' medial:    9.68 cm/s LV PW:         1.10 cm     LV E/e' medial:  8.9 LV IVS:        1.00 cm     LV e' lateral:   12.70 cm/s LVOT diam:     1.70 cm     LV E/e' lateral: 6.7 LV SV:         41 LV SV Index:   27 LVOT Area:     2.27 cm  LV Volumes (MOD) LV vol d, MOD A2C: 38.2 ml LV vol d, MOD A4C: 42.6 ml LV vol s, MOD A2C: 10.4 ml LV vol s, MOD A4C: 16.1 ml LV SV MOD A2C:     27.8 ml LV SV MOD A4C:     42.6 ml LV SV MOD BP:      28.3 ml RIGHT VENTRICLE RV Basal diam:  2.70 cm RV Mid diam:    2.30 cm RV S prime:     11.70 cm/s TAPSE (M-mode): 2.3 cm LEFT ATRIUM             Index        RIGHT ATRIUM           Index LA diam:        2.50 cm 1.63 cm/m   RA Area:     12.10 cm LA Vol (A2C):   34.1 ml 22.25 ml/m  RA Volume:   27.70 ml  18.07 ml/m LA Vol (A4C):   27.2 ml 17.74 ml/m LA Biplane Vol: 31.1 ml 20.29 ml/m  AORTIC VALVE AV Area (Vmax):    1.59 cm AV Area (Vmean):   1.42 cm AV Area (VTI):     1.23 cm AV Vmax:           135.00 cm/s AV Vmean:           97.300 cm/s AV VTI:            0.331 m AV Peak Grad:      7.3 mmHg AV Mean Grad:      4.0 mmHg LVOT Vmax:         94.60 cm/s LVOT Vmean:        60.700 cm/s LVOT VTI:          0.179 m LVOT/AV VTI ratio: 0.54  AORTA Ao Root diam: 3.10 cm Ao Asc diam:  2.50 cm MITRAL VALVE MV Area (PHT): 4.06 cm    SHUNTS MV Decel Time: 187 msec    Systemic VTI:  0.18 m MV E velocity: 85.70 cm/s  Systemic Diam: 1.70 cm MV A velocity: 85.70 cm/s MV E/A ratio:  1.00 Laurance Flatten MD Electronically signed by Laurance Flatten MD Signature Date/Time: 01/28/2023/3:48:06 PM    Final    MR BRAIN WO CONTRAST  Result Date: 01/27/2023 CLINICAL DATA:  Stroke, follow-up EXAM: MRI HEAD WITHOUT CONTRAST TECHNIQUE: Multiplanar, multiecho pulse sequences of the brain and surrounding structures were obtained without intravenous contrast. COMPARISON:  No prior MRI available, correlation is made with CT head 01/27/2023 FINDINGS: Brain: Scattered foci of restricted diffusion with ADC correlate throughout the right cerebral hemisphere, with more focal and contiguous areas of restricted diffusion in the right frontal and parietal cortex (series 5, image 89), watershed territory (series 5, image 85), right caudate and lentiform nucleus (series 5, image 78), and right occipital lobe (series 5, image 68-70). No acute hemorrhage, mass, mass effect, or midline shift. No hydrocephalus or extra-axial collection. No hemosiderin deposition to suggest remote hemorrhage. Scattered and confluent T2 hyperintense signal in the periventricular white matter and pons, likely the sequela of moderate to severe chronic small vessel ischemic disease. Normal cerebral volume for age. Vascular: Normal arterial flow voids. Skull and upper cervical spine: Normal marrow signal. Sinuses/Orbits: Mucosal thickening in the left ethmoid air cells. No acute finding in the orbits. Other: The mastoids are well aerated. IMPRESSION: Scattered acute infarcts throughout the right  cerebral hemisphere, with more focal areas of infarction in the right frontal and parietal cortex, watershed territory, right caudate and lentiform nucleus, and right occipital lobe. These results will be called to the ordering clinician or representative by the Radiologist Assistant, and communication documented in the PACS or [employer]. Electronically Signed   By: Wiliam Ke M.D.   On: 01/27/2023 23:45   CT HEAD CODE STROKE WO CONTRAST  Result Date: 01/27/2023 CLINICAL DATA:  Code stroke.  Left-sided weakness, right-sided gaze. EXAM: CT ANGIOGRAPHY HEAD AND NECK TECHNIQUE: Multidetector CT imaging of the head and neck was performed using the standard protocol during bolus administration of intravenous contrast. Multiplanar CT image reconstructions and MIPs were obtained to evaluate the vascular anatomy. Carotid stenosis measurements (when applicable) are obtained utilizing NASCET criteria, using the distal internal carotid diameter as the denominator. RADIATION DOSE REDUCTION: This exam was performed according to the departmental dose-optimization program which includes automated exposure control, adjustment of the mA and/or kV according to patient size and/or use of iterative reconstruction technique. CONTRAST:  75 cc Omnipaque 350 COMPARISON:  CT head 09/29/2021 FINDINGS: CT HEAD FINDINGS Brain: There is no evidence of acute intracranial hemorrhage, extra-axial fluid collection, or acute territorial infarct. Parenchymal volume is normal. The ventricles are normal in size. Gray-white differentiation is preserved. There is patchy hypodensity in the supratentorial white matter likely reflecting sequela of advanced chronic small-vessel ischemic change. Pituitary and suprasellar region are normal. There is no mass lesion there is no mass effect or midline shift. Vascular: No hyperdense vessel is seen. Skull: Normal. Negative for fracture or focal lesion. Sinuses/Orbits: The paranasal sinuses are clear.  The globes and orbits are unremarkable. Other: None. ASPECTS [facility] Stroke Program Early CT Score) - Ganglionic level infarction (caudate, lentiform nuclei, internal capsule, insula, M1-M3 cortex): 7 - Supraganglionic infarction (M4-M6 cortex): 3 Total score (0-10 with 10 being normal): 10 CTA NECK FINDINGS Aortic arch: There is mild calcified plaque in the imaged aortic arch. The origins of the major branch vessels are patent. The subclavian arteries are patent to the level imaged. Right carotid system: The right common carotid artery is patent. There is mixed plaque of the bifurcation with noncalcified plaque projecting into the lumen resulting in hemodynamically significant stenosis just after the bifurcation, estimated at 60%. Distally, there is additional mural adherent clot in the lumen of the ICA resulting in high-grade stenosis, estimated at 80%. There is no raised dissection flap. The distal internal carotid artery is patent. The external carotid artery is patent. There is no evidence of aneurysm/pseudoaneurysm. Left carotid system: The left common carotid artery is patent. There is calcified plaque at the bifurcation without hemodynamically significant stenosis or occlusion. The internal and external carotid arteries are patent. There is no evidence of dissection or aneurysm/pseudoaneurysm. Vertebral arteries: The vertebral arteries are patent, without hemodynamically significant stenosis or occlusion. There is no evidence of dissection or aneurysm/pseudoaneurysm. Skeleton: There is no acute osseous abnormality or suspicious osseous lesion. There is no visible canal hematoma. Other neck: There is extensive dental disease. The soft tissues of the neck are unremarkable. Upper chest: There is emphysema in the lung apices. Review of the MIP images confirms the above findings CTA HEAD FINDINGS Anterior circulation: There is calcified plaque in the intracranial ICAs without significant stenosis or occlusion.  The right M1 segment is patent. The branch vessels are patent, without proximal stenosis or occlusion. The left MCA is patent, without proximal stenosis or occlusion. The bilateral ACAs are patent, without proximal stenosis or occlusion. There is no aneurysm or AVM. Posterior circulation: The bilateral V4 segments are patent. The basilar artery is patent. The major cerebellar arteries appear patent. The bilateral PCAs are patent, without proximal stenosis or occlusion. There is a fetal origin of the right PCA. A left posterior communicating artery is also identified. There is no aneurysm or AVM. Venous sinuses: Patent. Anatomic variants: As above. Review of the MIP images confirms the above findings IMPRESSION: 1.  No acute intracranial pathology.  No evidence of evolved infarct. 2. Mural adherent clot in the right internal carotid artery after the bifurcation resulting in high-grade stenosis, estimated at 80%. 3. More proximally in the right internal carotid artery just after the bifurcation, mixed plaque results in proximally 60% stenosis. 4. Mild plaque at the left carotid bifurcation without significant stenosis. Patent vertebral arteries in the neck. 5. Patent intracranial vasculature. 6. Emphysema. Findings of the noncontrast head CT were communicated to Dr Roda Shutters at 11:32 am. The the CTA results were discussed at 11:43 am. Electronically Signed   By: Lesia Hausen M.D.   On: 01/27/2023 11:57   CT ANGIO HEAD NECK W WO CM (CODE STROKE)  Result Date: 01/27/2023 CLINICAL DATA:  Code stroke.  Left-sided weakness, right-sided gaze. EXAM: CT ANGIOGRAPHY HEAD AND NECK TECHNIQUE: Multidetector CT imaging of the head and neck was performed using the standard protocol during bolus administration of intravenous contrast. Multiplanar CT image reconstructions and MIPs were obtained to evaluate the vascular anatomy. Carotid stenosis measurements (when applicable) are obtained utilizing NASCET criteria, using the distal  internal carotid diameter as the denominator. RADIATION DOSE REDUCTION: This exam was performed according to the departmental dose-optimization program which includes automated exposure control, adjustment of the mA and/or kV according to patient size and/or use of iterative reconstruction technique. CONTRAST:  75 cc Omnipaque 350 COMPARISON:  CT head 09/29/2021 FINDINGS: CT HEAD FINDINGS Brain: There is no evidence of acute intracranial hemorrhage, extra-axial fluid collection, or acute territorial infarct. Parenchymal volume is normal. The ventricles are normal in size. Gray-white differentiation is preserved. There is patchy hypodensity in the supratentorial white matter likely reflecting sequela of advanced chronic small-vessel ischemic change. Pituitary and suprasellar region are normal. There is no mass lesion there is no mass effect or midline shift. Vascular: No hyperdense vessel is seen. Skull: Normal. Negative for fracture or focal lesion. Sinuses/Orbits: The paranasal sinuses are clear. The globes and orbits are unremarkable. Other: None. ASPECTS [facility] Stroke Program Early CT Score) - Ganglionic level infarction (caudate, lentiform nuclei, internal capsule, insula, M1-M3 cortex): 7 - Supraganglionic infarction (M4-M6 cortex): 3 Total score (0-10 with 10 being normal): 10 CTA NECK FINDINGS Aortic arch: There is mild calcified plaque in the imaged aortic arch. The origins of the major branch vessels are patent. The subclavian arteries are patent to the level imaged. Right carotid system: The right common carotid artery is patent. There is mixed plaque of the bifurcation with noncalcified plaque projecting into the lumen resulting in hemodynamically significant stenosis just after the bifurcation, estimated at 60%. Distally, there is additional mural adherent clot in the lumen of the ICA resulting in high-grade stenosis, estimated at 80%. There is no raised dissection flap. The distal internal carotid  artery is patent. The external carotid artery is patent. There is no evidence of aneurysm/pseudoaneurysm. Left carotid system: The left common carotid artery is patent. There is calcified plaque at the bifurcation without hemodynamically significant stenosis or occlusion. The internal and external carotid arteries are patent. There is no evidence of dissection or aneurysm/pseudoaneurysm. Vertebral arteries: The vertebral arteries are patent, without hemodynamically significant stenosis or occlusion. There is no evidence of dissection or aneurysm/pseudoaneurysm. Skeleton: There is no acute osseous abnormality or suspicious osseous lesion. There is no visible canal hematoma. Other neck: There is extensive dental disease. The soft tissues of the neck are unremarkable. Upper chest: There is emphysema in the lung apices. Review of the MIP images confirms the above findings CTA HEAD  FINDINGS Anterior circulation: There is calcified plaque in the intracranial ICAs without significant stenosis or occlusion. The right M1 segment is patent. The branch vessels are patent, without proximal stenosis or occlusion. The left MCA is patent, without proximal stenosis or occlusion. The bilateral ACAs are patent, without proximal stenosis or occlusion. There is no aneurysm or AVM. Posterior circulation: The bilateral V4 segments are patent. The basilar artery is patent. The major cerebellar arteries appear patent. The bilateral PCAs are patent, without proximal stenosis or occlusion. There is a fetal origin of the right PCA. A left posterior communicating artery is also identified. There is no aneurysm or AVM. Venous sinuses: Patent. Anatomic variants: As above. Review of the MIP images confirms the above findings IMPRESSION: 1. No acute intracranial pathology.  No evidence of evolved infarct. 2. Mural adherent clot in the right internal carotid artery after the bifurcation resulting in high-grade stenosis, estimated at 80%. 3. More  proximally in the right internal carotid artery just after the bifurcation, mixed plaque results in proximally 60% stenosis. 4. Mild plaque at the left carotid bifurcation without significant stenosis. Patent vertebral arteries in the neck. 5. Patent intracranial vasculature. 6. Emphysema. Findings of the noncontrast head CT were communicated to Dr Roda Shutters at 11:32 am. The the CTA results were discussed at 11:43 am. Electronically Signed   By: Lesia Hausen M.D.   On: 01/27/2023 11:57     PHYSICAL EXAM  Temp:  [98 F (36.7 C)-98.8 F (37.1 C)] 98 F (36.7 C) (06/03 1115) Pulse Rate:  [71-87] 71 (06/03 1115) Resp:  [16-18] 18 (06/03 1115) BP: (112-151)/(67-101) 139/70 (06/03 1115) SpO2:  [95 %-100 %] 98 % (06/03 1115)  General - Well nourished, well developed pleasant middle-age lady, in no apparent distress.  Cardiovascular - Regular rhythm and rate.  Respiratory: Regular, unlabored respirations on room air  Neuro - awake, alert, eyes open. A/O x 3.  Follows simple and complex commands.  Right gaze preference but able to cross midline easily.  Slight left facial droop, tongue midline.  Right upper extremity 5 out of 5 strength, left upper extremity slightly weaker with 4 out of 5 strength, right arm orbits left, right lower extremity 4+/5 strength, left lower extremity 4/5 strength   ASSESSMENT/PLAN [name] is a 72 y.o. female with history of PAD, CAD, smoker, HTN, HLD, on chronic coumadin presented for left sided weakness, right forced gaze and left neglect. NIHSS = 14. CT no acute finding. INR 2.4. CTA showed right carotid mural thrombus with high grade stenosis. Not TNK candidate due to high INR. Given severe neuro deficit and right ICA flow limiting on imaging, discussed with Dr. Corliss Skains, activated code IR.  Mechanical thrombectomy of right ICA was performed with TICI 3 revascularization.  Of note, patient was started on Coumadin many years ago by vascular surgery for peripheral  arterial disease with femoral-popliteal stenting.  Her stents are occluded, and she has not seen vascular surgery for 3 years.  After discussion with vascular surgery here, will discontinue Coumadin and start patient on DAPT with aspirin and Plavix.  Stroke:  right MCA and MCA/ACA watershed zone given a mixed etiology of scattered LAA embolic infarct phenomena plus cortical watershed hypoperfusion from the intramural right ICA thrombus, likely from underlying large vessel disease even on coumadin with therapeutic INR s/p successful mechanical thrombectomy with underlying 55% right ICA stenosis CT no acute finding CTA showed right carotid mural thrombus with high grade stenosis 80%. More proximally in the  right internal carotid artery just after the bifurcation, mixed plaque results in proximally 60% stenosis. MRI  Scattered acute infarcts throughout the right cerebral hemisphere, with more focal areas of infarction in the right frontal and parietal cortex, watershed territory, right caudate and lentiform nucleus, and right occipital lobe. Carotid Doppler 1 to 39% bilaterally 2D Echo EF 55 to 60% LDL 53 HgbA1c 6.2 Coumadin for VTE prophylaxis warfarin daily prior to admission, now on aspirin 81 mg daily and Plavix 75 mg daily yes  Patient counseled to be compliant with her antithrombotic medications Ongoing aggressive stroke risk factor management Therapy recommendations: CIR Disposition:  pending  Anticoagulation with Coumadin for PAD On Coumadin at home PTA for peripheral arterial disease INR 2.4->2.2->2.1-> 2.7-> 2.9 Given developed carotid intramural thrombus in the setting of therapeutic INR, will discontinue warfarin and switch to dual antiplatelet therapy of aspirin and Plavix After discussion with vascular surgery here, will discontinue Coumadin and start DAPT with aspirin and Plavix  Hypertension Stable Off Cleviprex BP goal less than 180/105 Long term BP goal  normotensive  Hyperlipidemia Home meds: Lipitor 10 LDL 53, goal < 70 Now on Lipitor 10 No high intensity statin given LDL within goal Continue statin at discharge  Tobacco abuse Current smoker Smoking cessation counseling provided Nicotine patch provided Pt is willing to quit  Other Stroke Risk Factors Advanced age PAD CAD  Other Active Problems Mild hyponatremia, sodium 134  Hospital day # 5  Patient seen by NP and then by MD, MD to edit note is needed  Cortney E Ernestina Columbia , MSN, AGACNP-BC [facility] See Amion for schedule and pager information 02/01/2023 12:56 PM  I have personally obtained history,examined this patient, reviewed notes, independently viewed imaging studies, participated in medical decision making and plan of care.ROS completed by me personally and pertinent positives fully documented  I have made any additions or clarifications directly to the above note. Agree with note above.  Patient presented with right MCA infarct secondary to right carotid occlusion and underwent successful mechanical thrombectomy but has underlying moderate carotid stenosis.  She was on warfarin anticoagulation for peripheral vascular disease and despite therapeutic anticoagulation he still had a stroke hence we will discontinue warfarin and switch switch to dual antiplatelet therapy of aspirin and Plavix.  Continue physical occupational therapy.  Transfer to rehab when bed available.  Greater than 50% time during this 35-minute visit was spent in counseling and coordination of care about her stroke discussion about risk-benefit of anticoagulation with warfarin and answering questions.  Delia Heady, MD Medical Director [facility] Pager: [phone] 02/01/2023 3:06 PM   To contact Stroke Continuity provider, please refer to [email or web address]. After hours, contact General Neurology

## 2023-02-01 NOTE — Progress Notes (Signed)
Inpatient Rehab Admissions Coordinator:   Submitted FEES note from 5/30 for insurance review.  Following for [facility] today.    Estill Dooms, PT, DPT Admissions Coordinator [phone] 02/01/23  9:43 AM

## 2023-02-01 NOTE — Progress Notes (Signed)
Inpatient Rehab Admissions Coordinator:   We did receive insurance auth for CIR admit.  I do not have a bed for this patient to admit today.  Will follow for potential admit pending bed availability.   Estill Dooms, PT, DPT Admissions Coordinator [phone] 02/01/23  3:30 PM

## 2023-02-01 NOTE — Progress Notes (Signed)
Physical Therapy Treatment Patient Details Name: Mary [name] MRN: [id number] DOB: 08/25/1951 Today's Date: 02/01/2023   History of Present Illness 72 yo female presents to [facility] on 5/29 with L weakness, R gaze. CTA showed right carotid mural thrombus with high grade stenosis, s/p s/p complete revascularization of R ICA on 5/29. MRI brain shows Scattered acute infarcts throughout the right cerebral hemisphere,  with more focal areas of infarction in the right frontal and parietal cortex, watershed territory, right caudate and lentiform nucleus, and right occipital lobe. PMH includes  PAD, HTN, HLD, tobacco abuse, CAD, osteoporosis.    PT Comments    Pt with increased time but requires no cueing for EOB dangle. Pt with 2 person HHA for dynamic balance activities EOB, toe-tapping for colored notes on the ground, able to increase speed with min A. Pt continues to show decreased L attention with AD management during amb, but shows carryover from prior sessions noting they should "straddle the diamond" in the middle of the hallway to maintain a straight path. Pt with difficulty for dual tasking, demos slowed gait speed. Pt with short term recall of three room numbers immediately after asking and 5 minutes post. Pt continues to have L drift w RW and still mixes up R and L when asked to turn their head towards the L. Pt will continue to benefit from skilled therapy to facilitate the above impairments.    Recommendations for follow up therapy are one component of a multi-disciplinary discharge planning process, led by the attending physician.  Recommendations may be updated based on patient status, additional functional criteria and insurance authorization.  Follow Up Recommendations       Assistance Recommended at Discharge Frequent or constant Supervision/Assistance  Patient can return home with the following A little help with walking and/or transfers;A little help with  bathing/dressing/bathroom;Assistance with cooking/housework;Direct supervision/assist for medications management;Direct supervision/assist for financial management;Assist for transportation;Help with stairs or ramp for entrance   Equipment Recommendations  None recommended by PT    Recommendations for Other Services       Precautions / Restrictions Precautions Precautions: Fall;Other (comment) Precaution Comments: L inattention Restrictions Weight Bearing Restrictions: No     Mobility  Bed Mobility Overal bed mobility: Needs Assistance Bed Mobility: Supine to Sit     Supine to sit: Min guard     General bed mobility comments: Increased time for BLE navigation to EOB.    Transfers Overall transfer level: Needs assistance Equipment used: None, Rolling walker (2 wheels) Transfers: Sit to/from Stand Sit to Stand: Min guard           General transfer comment: Slow to rise, increased time for anterior weight shift    Ambulation/Gait Ambulation/Gait assistance: Min guard, Min assist Gait Distance (Feet): 120 Feet Assistive device: Rolling walker (2 wheels) Gait Pattern/deviations: Step-through pattern, Decreased stride length, Narrow base of support, Drifts right/left (L drift) Gait velocity: decreased Gait velocity interpretation: <1.8 ft/sec, indicate of risk for recurrent falls Pre-gait activities: 2x HHA dynamic weight shifting: 3 colored notes on the floor to which pt was asked to toe tap with their LLE, all notes positioned past L midline to facilitate L attention. Sidestepping EOB. General Gait Details: Pt with slow pace using RW, L drift corrected through verbal cueing. Infrequent events of steps crossing midline.   Stairs             Wheelchair Mobility    Modified Rankin (Stroke Patients Only) Modified Rankin (Stroke Patients Only) Pre-Morbid Rankin  Score: No symptoms Modified Rankin: Moderately severe disability     Balance Overall balance  assessment: Needs assistance, History of Falls Sitting-balance support: No upper extremity supported, Feet supported Sitting balance-Leahy Scale: Fair Sitting balance - Comments: Reaches outside limits of anterior stabiliy to grasp RW.   Standing balance support: No upper extremity supported, During functional activity Standing balance-Leahy Scale: Poor Standing balance comment: reliant on external support                            Cognition Arousal/Alertness: Awake/alert Behavior During Therapy: WFL for tasks assessed/performed Overall Cognitive Status: Impaired/Different from baseline                       Memory: Decreased short-term memory Following Commands: Follows one step commands with increased time, Follows multi-step commands inconsistently Safety/Judgement: Decreased awareness of deficits, Decreased awareness of safety Awareness: Emergent   General Comments: Pt continues to have trouble avoiding obstacles on the L side during amb, corrects with verbal cueing. Pt requires infrequent reminders for AD positioning and proximity during transfers and amb, carryover from prior sessions for maintaining a straight path. Pt with improved short term recall, able to repeat back 3 room numbers after 5 minutes. Mod verbal cues for identifying objects hung on hallway wall on L side.        Exercises      General Comments General comments (skin integrity, edema, etc.): pt pointing out R elbow swelling and apparent location of prior IV with small reddened area - notified RN who plans to assess      Pertinent Vitals/Pain Pain Assessment Pain Assessment: Faces Faces Pain Scale: No hurt    Home Living                          Prior Function            PT Goals (current goals can now be found in the care plan section) Acute Rehab PT Goals PT Goal Formulation: With patient Time For Goal Achievement: 02/11/23 Potential to Achieve Goals:  Good Progress towards PT goals: Progressing toward goals    Frequency    Min 4X/week      PT Plan Current plan remains appropriate    Co-evaluation              AM-PAC PT "6 Clicks" Mobility   Outcome Measure  Help needed turning from your back to your side while in a flat bed without using bedrails?: A Little Help needed moving from lying on your back to sitting on the side of a flat bed without using bedrails?: A Little Help needed moving to and from a bed to a chair (including a wheelchair)?: A Little Help needed standing up from a chair using your arms (e.g., wheelchair or bedside chair)?: A Little Help needed to walk in hospital room?: A Little Help needed climbing 3-5 steps with a railing? : A Little 6 Click Score: 18    End of Session Equipment Utilized During Treatment: Gait belt Activity Tolerance: Patient tolerated treatment well Patient left: in bed;with call bell/phone within reach (OT began session with pt seated EOB)   PT Visit Diagnosis: Other abnormalities of gait and mobility (R26.89);History of falling (Z91.81)     Time: 1610-9604 PT Time Calculation (min) (ACUTE ONLY): 25 min  Charges:  $Therapeutic Activity: 23-37 mins  Hendricks Milo, SPT  Acute Rehabilitation Services    Hendricks Milo 02/01/2023, 2:10 PM

## 2023-02-01 NOTE — TOC Progression Note (Signed)
Transition of Care [facility]) - Progression Note    Patient Details  Name: Mary [name] MRN: [id number] Date of Birth: Dec 01, 1950  Transition of Care [facility]) CM/SW Contact  [name], RN Phone Number: 02/01/2023, 3:37 PM  Clinical Narrative:   Chart reviewed. Patient on ASA and Plavix.  CIR following, awaiting bed availability.     Expected Discharge Plan: IP Rehab Facility    Expected Discharge Plan and Services                                               Social Determinants of Health (SDOH) Interventions SDOH Screenings   Food Insecurity: No Food Insecurity (01/29/2023)  Housing: Low Risk  (01/29/2023)  Transportation Needs: No Transportation Needs (01/29/2023)  Utilities: Not At Risk (01/29/2023)  Alcohol Screen: Low Risk  (07/06/2022)  Depression (PHQ2-9): Low Risk  (01/04/2023)  Financial Resource Strain: Low Risk  (02/24/2022)  Physical Activity: Sufficiently Active (02/24/2022)  Social Connections: Socially Isolated (02/24/2022)  Stress: No Stress Concern Present (02/24/2022)  Tobacco Use: High Risk (01/29/2023)    Readmission Risk Interventions     No data to display

## 2023-02-01 NOTE — Progress Notes (Signed)
ANTICOAGULATION CONSULT NOTE  Pharmacy Consult:  Warfarin Indication:  PVD, R carotid mural thrombus  No Known Allergies  Patient Measurements: Height: 5\' 4"  (162.6 cm) Weight: 51.1 kg (112 lb 10.5 oz) IBW/kg (Calculated) : 54.7  Vital Signs: Temp: 98.1 F (36.7 C) (06/03 0742) Temp Source: Oral (06/03 0742) BP: 112/67 (06/03 0742) Pulse Rate: 81 (06/03 0742)  Labs: Recent Labs    01/30/23 0249 01/31/23 0353 02/01/23 0749  LABPROT 25.8* 29.1* 30.3*  INR 2.3* 2.7* 2.9*     Estimated Creatinine Clearance: 53.5 mL/min (by C-G formula based on SCr of 0.75 mg/dL).   Medical History: Past Medical History:  Diagnosis Date   Arthritis    Hyperlipidemia    Hypertension    Hyperuricemia    Osteoporosis    PAD (peripheral artery disease) (HCC) 02/2010   angiplasty, popliteal artery   Tobacco abuse     Assessment: 72 yo F on Coumadin 5mg  PO daily except 3mg  one day per week PTA for history of PVD.  INR was therapeutic at 2.4 and patient presented with scattered infarcts.  INR goal increased to 2.5-3.5 per Neuro.  No hemorrhage and Pharmacy consulted to resume Coumadin.  ASA 325 also started until INR therapeutic.  INR today remains therapeutic at 2.9; no bleeding reported.  Goal of Therapy:  INR goal 2.5-3.5 per Neuro Monitor platelets by anticoagulation protocol: Yes   Plan:  Start Warfarin 5 mg daily Daily PT / INR Will discontinue ASA with repeat therapeutic INR per neuro recs.   [employer], Pharm.D., BCPS Clinical Pharmacist Clinical phone for 02/01/2023 from 7:30-3:00 is [phone].  **Pharmacist phone directory can be found on [email or web address] listed under [facility].  02/01/2023 11:16 AM

## 2023-02-01 NOTE — Progress Notes (Signed)
Occupational Therapy Treatment Patient Details Name: Mary [name] MRN: [id number] DOB: April 29, 1951 Today's Date: 02/01/2023   History of present illness 72 yo female presents to [facility] on 5/29 with L weakness, R gaze. CTA showed right carotid mural thrombus with high grade stenosis, s/p s/p complete revascularization of R ICA on 5/29. MRI brain shows Scattered acute infarcts throughout the right cerebral hemisphere,  with more focal areas of infarction in the right frontal and parietal cortex, watershed territory, right caudate and lentiform nucleus, and right occipital lobe. PMH includes  PAD, HTN, HLD, tobacco abuse, CAD, osteoporosis.   OT comments  Pt progressing gradually towards OT goals though remains limited by L inattention. Pt able to manage ADLs standing at sink with min guard to Min A with emphasis on scanning to L side to locate ADL items. Significant cueing needed to scan towards L side and use sink as an anchor. Continue to feel pt would benefit from intensive therapy services as pt with excellent rehab potential.    Recommendations for follow up therapy are one component of a multi-disciplinary discharge planning process, led by the attending physician.  Recommendations may be updated based on patient status, additional functional criteria and insurance authorization.    Assistance Recommended at Discharge Frequent or constant Supervision/Assistance  Patient can return home with the following  Assistance with cooking/housework;Direct supervision/assist for medications management;Direct supervision/assist for financial management;Assist for transportation;Help with stairs or ramp for entrance;A little help with walking and/or transfers;A little help with bathing/dressing/bathroom   Equipment Recommendations  Other (comment) (TBD pending progress)    Recommendations for Other Services Rehab consult    Precautions / Restrictions Precautions Precautions: Fall;Other (comment) Precaution  Comments: L inattention Restrictions Weight Bearing Restrictions: No       Mobility Bed Mobility Overal bed mobility: Modified Independent Bed Mobility: Sit to Supine       Sit to supine: Modified independent (Device/Increase time)        Transfers Overall transfer level: Needs assistance Equipment used: Rolling walker (2 wheels) Transfers: Sit to/from Stand Sit to Stand: Min guard                 Balance Overall balance assessment: Needs assistance, History of Falls Sitting-balance support: No upper extremity supported, Feet supported Sitting balance-Leahy Scale: Good     Standing balance support: During functional activity, Bilateral upper extremity supported Standing balance-Leahy Scale: Fair                             ADL either performed or assessed with clinical judgement   ADL Overall ADL's : Needs assistance/impaired     Grooming: Minimal assistance;Standing;Applying deodorant;Wash/dry face;Wash/dry hands Grooming Details (indicate cue type and reason): perseverating some with washing hands; cueing needed to locate body wash as pt attempting to place lotion on washcloth in sink. Cues needed to locate sink then scan to L side to find deodorant - pt looking more on R side for items Upper Body Bathing: Minimal assistance;Standing Upper Body Bathing Details (indicate cue type and reason): washing underarms in standing, assist with clothing and problem solving ability to reach Lower Body Bathing: Min guard;Sit to/from stand Lower Body Bathing Details (indicate cue type and reason): able to bathe peri region in standing without assist     Lower Body Dressing: Min guard;Sit to/from stand;Sitting/lateral leans Lower Body Dressing Details (indicate cue type and reason): sock mgmt  Functional mobility during ADLs: Minimal assistance;Rolling walker (2 wheels) General ADL Comments: limited by L inattention, focus on scanning sink for  ADL items with use of sink as anchor    Extremity/Trunk Assessment Upper Extremity Assessment Upper Extremity Assessment: LUE deficits/detail LUE Deficits / Details: good grip strength >R, but gross motor 4-/5; decr coordination, attention to L side LUE Coordination: decreased fine motor;decreased gross motor   Lower Extremity Assessment Lower Extremity Assessment: Defer to PT evaluation        Vision   Vision Assessment?: Vision impaired- to be further tested in functional context Additional Comments: L inattention, with cues, able to scan to L fairly easily with increased cues/time to locate items on L side   Perception     Praxis      Cognition Arousal/Alertness: Awake/alert Behavior During Therapy: WFL for tasks assessed/performed Overall Cognitive Status: Impaired/Different from baseline Area of Impairment: Following commands, Safety/judgement, Awareness, Memory, Attention, Problem solving                   Current Attention Level: Selective Memory: Decreased short-term memory Following Commands: Follows one step commands with increased time Safety/Judgement: Decreased awareness of deficits, Decreased awareness of safety Awareness: Emergent Problem Solving: Requires verbal cues General Comments: pleasant, appropriate in conversation and follows commands consistently. increased cueing needed due to L inattention ( to scan, awareness of what certain ADL bottles were on sink). question some memory deficits as pt reported having pancakes for lunch but unsure what she had for breakfast        Exercises      Shoulder Instructions       General Comments pt pointing out R elbow swelling and apparent location of prior IV with small reddened area - notified RN who plans to assess    Pertinent Vitals/ Pain       Pain Assessment Pain Assessment: No/denies pain  Home Living                                          Prior Functioning/Environment               Frequency  Min 2X/week        Progress Toward Goals  OT Goals(current goals can now be found in the care plan section)  Progress towards OT goals: Progressing toward goals  Acute Rehab OT Goals Patient Stated Goal: increase independence, go home soon OT Goal Formulation: With patient Time For Goal Achievement: 02/11/23 Potential to Achieve Goals: Good ADL Goals Pt Will Perform Grooming: with supervision;standing Pt Will Perform Upper Body Dressing: with set-up;sitting Pt Will Perform Lower Body Dressing: with min guard assist;sit to/from stand Pt Will Transfer to Toilet: with min guard assist;ambulating;regular height toilet Additional ADL Goal #1: Pt will follow three step commands with incr time. Additional ADL Goal #2: Pt will attend to L side of environment as indicated by awareness of items on L side during functional mobility and no need for cues to locate items on L during grooming.  Plan Discharge plan remains appropriate    Co-evaluation                 AM-PAC OT "6 Clicks" Daily Activity     Outcome Measure   Help from another person eating meals?: A Little Help from another person taking care of personal grooming?: A Little Help from another person toileting,  which includes using toliet, bedpan, or urinal?: A Lot Help from another person bathing (including washing, rinsing, drying)?: A Little Help from another person to put on and taking off regular upper body clothing?: A Little Help from another person to put on and taking off regular lower body clothing?: A Lot 6 Click Score: 16    End of Session Equipment Utilized During Treatment: Rolling walker (2 wheels)  OT Visit Diagnosis: Unsteadiness on feet (R26.81);Muscle weakness (generalized) (M62.81);Other abnormalities of gait and mobility (R26.89);Hemiplegia and hemiparesis;Low vision, both eyes (H54.2) Hemiplegia - Right/Left: Left Hemiplegia - dominant/non-dominant:  Dominant Hemiplegia - caused by: Cerebral infarction   Activity Tolerance Patient tolerated treatment well   Patient Left in bed;with call bell/phone within reach;with bed alarm set   Nurse Communication          Time: 4098-1191 OT Time Calculation (min): 27 min  Charges: OT General Charges $OT Visit: 1 Visit OT Treatments $Self Care/Home Management : 23-37 mins  Bradd Canary, OTR/L Acute Rehab Services Office: [phone]   Lorre Munroe 02/01/2023, 1:17 PM

## 2023-02-02 ENCOUNTER — Encounter (HOSPITAL_COMMUNITY): Payer: Self-pay | Admitting: Physical Medicine & Rehabilitation

## 2023-02-02 ENCOUNTER — Inpatient Hospital Stay (HOSPITAL_COMMUNITY): Payer: Medicare PPO

## 2023-02-02 ENCOUNTER — Other Ambulatory Visit: Payer: Self-pay

## 2023-02-02 ENCOUNTER — Inpatient Hospital Stay (HOSPITAL_COMMUNITY)
Admission: RE | Admit: 2023-02-02 | Discharge: 2023-02-09 | DRG: 057 | Disposition: A | Discharge status: Home Health | Payer: Medicare PPO | Source: Intra-hospital | Attending: Physical Medicine & Rehabilitation | Admitting: Physical Medicine & Rehabilitation

## 2023-02-02 DIAGNOSIS — F17210 Nicotine dependence, cigarettes, uncomplicated: Secondary | ICD-10-CM | POA: Diagnosis not present

## 2023-02-02 DIAGNOSIS — I69352 Hemiplegia and hemiparesis following cerebral infarction affecting left dominant side: Secondary | ICD-10-CM | POA: Diagnosis not present

## 2023-02-02 DIAGNOSIS — I63031 Cerebral infarction due to thrombosis of right carotid artery: Principal | ICD-10-CM

## 2023-02-02 DIAGNOSIS — M25511 Pain in right shoulder: Secondary | ICD-10-CM | POA: Diagnosis present

## 2023-02-02 DIAGNOSIS — Z7901 Long term (current) use of anticoagulants: Secondary | ICD-10-CM | POA: Diagnosis not present

## 2023-02-02 DIAGNOSIS — Z8261 Family history of arthritis: Secondary | ICD-10-CM

## 2023-02-02 DIAGNOSIS — E785 Hyperlipidemia, unspecified: Secondary | ICD-10-CM | POA: Diagnosis not present

## 2023-02-02 DIAGNOSIS — I63511 Cerebral infarction due to unspecified occlusion or stenosis of right middle cerebral artery: Secondary | ICD-10-CM

## 2023-02-02 DIAGNOSIS — R7303 Prediabetes: Secondary | ICD-10-CM | POA: Diagnosis not present

## 2023-02-02 DIAGNOSIS — I69392 Facial weakness following cerebral infarction: Secondary | ICD-10-CM | POA: Diagnosis not present

## 2023-02-02 DIAGNOSIS — Z9861 Coronary angioplasty status: Secondary | ICD-10-CM

## 2023-02-02 DIAGNOSIS — Z833 Family history of diabetes mellitus: Secondary | ICD-10-CM

## 2023-02-02 DIAGNOSIS — I739 Peripheral vascular disease, unspecified: Secondary | ICD-10-CM

## 2023-02-02 DIAGNOSIS — Z79899 Other long term (current) drug therapy: Secondary | ICD-10-CM

## 2023-02-02 DIAGNOSIS — I70201 Unspecified atherosclerosis of native arteries of extremities, right leg: Secondary | ICD-10-CM | POA: Diagnosis present

## 2023-02-02 DIAGNOSIS — M810 Age-related osteoporosis without current pathological fracture: Secondary | ICD-10-CM | POA: Diagnosis present

## 2023-02-02 DIAGNOSIS — Z96642 Presence of left artificial hip joint: Secondary | ICD-10-CM | POA: Diagnosis present

## 2023-02-02 DIAGNOSIS — E782 Mixed hyperlipidemia: Secondary | ICD-10-CM

## 2023-02-02 DIAGNOSIS — Z716 Tobacco abuse counseling: Secondary | ICD-10-CM

## 2023-02-02 DIAGNOSIS — I2510 Atherosclerotic heart disease of native coronary artery without angina pectoris: Secondary | ICD-10-CM | POA: Diagnosis not present

## 2023-02-02 DIAGNOSIS — Z823 Family history of stroke: Secondary | ICD-10-CM

## 2023-02-02 DIAGNOSIS — K5900 Constipation, unspecified: Secondary | ICD-10-CM | POA: Diagnosis not present

## 2023-02-02 DIAGNOSIS — M1990 Unspecified osteoarthritis, unspecified site: Secondary | ICD-10-CM | POA: Diagnosis present

## 2023-02-02 DIAGNOSIS — Z8249 Family history of ischemic heart disease and other diseases of the circulatory system: Secondary | ICD-10-CM

## 2023-02-02 DIAGNOSIS — Z95820 Peripheral vascular angioplasty status with implants and grafts: Secondary | ICD-10-CM

## 2023-02-02 DIAGNOSIS — Z825 Family history of asthma and other chronic lower respiratory diseases: Secondary | ICD-10-CM

## 2023-02-02 DIAGNOSIS — M7989 Other specified soft tissue disorders: Secondary | ICD-10-CM | POA: Diagnosis not present

## 2023-02-02 DIAGNOSIS — I10 Essential (primary) hypertension: Secondary | ICD-10-CM | POA: Diagnosis present

## 2023-02-02 LAB — GLUCOSE, CAPILLARY
Glucose-Capillary: 133 mg/dL — ABNORMAL HIGH (ref 70–99)
Glucose-Capillary: 90 mg/dL (ref 70–99)

## 2023-02-02 LAB — PROTIME-INR
INR: 7.2 (ref 0.8–1.2)
Prothrombin Time: 62.1 seconds — ABNORMAL HIGH (ref 11.4–15.2)

## 2023-02-02 MED ORDER — CLOPIDOGREL BISULFATE 75 MG PO TABS: 75.0000 mg | ORAL_TABLET | Freq: Every day | ORAL | 1 refills | Status: DC

## 2023-02-02 MED ORDER — ASPIRIN 81 MG PO CHEW: 81.0000 mg | CHEWABLE_TABLET | Freq: Every day | ORAL | 1 refills | Status: AC

## 2023-02-02 MED ORDER — SENNOSIDES-DOCUSATE SODIUM 8.6-50 MG PO TABS: 1.0000 | ORAL_TABLET | Freq: Every evening | ORAL | Status: DC | PRN

## 2023-02-02 MED ORDER — CLOPIDOGREL BISULFATE 75 MG PO TABS
75.0000 mg | ORAL_TABLET | Freq: Every day | ORAL | Status: DC
  Administered 2023-02-03 – 2023-02-09 (×7): 75 mg via ORAL
  Filled 2023-02-02 (×7): qty 1

## 2023-02-02 MED ORDER — ATORVASTATIN CALCIUM 10 MG PO TABS
10.0000 mg | ORAL_TABLET | Freq: Every day | ORAL | Status: DC
  Administered 2023-02-02 – 2023-02-08 (×7): 10 mg via ORAL
  Filled 2023-02-02 (×7): qty 1

## 2023-02-02 MED ORDER — NICOTINE 7 MG/24HR TD PT24
7.0000 mg | MEDICATED_PATCH | Freq: Every day | TRANSDERMAL | Status: DC
  Administered 2023-02-03 – 2023-02-09 (×7): 7 mg via TRANSDERMAL
  Filled 2023-02-02 (×7): qty 1

## 2023-02-02 MED ORDER — ASPIRIN 81 MG PO CHEW
81.0000 mg | CHEWABLE_TABLET | Freq: Every day | ORAL | Status: DC
  Administered 2023-02-03 – 2023-02-09 (×7): 81 mg via ORAL
  Filled 2023-02-02 (×7): qty 1

## 2023-02-02 MED ORDER — ACETAMINOPHEN 160 MG/5ML PO SOLN: 650.0000 mg | ORAL | Status: DC | PRN

## 2023-02-02 MED ORDER — ACETAMINOPHEN 650 MG RE SUPP: 650.0000 mg | RECTAL | Status: DC | PRN

## 2023-02-02 MED ORDER — ACETAMINOPHEN 325 MG PO TABS: 650.0000 mg | ORAL_TABLET | ORAL | Status: DC | PRN

## 2023-02-02 NOTE — TOC Transition Note (Signed)
Transition of Care [facility]) - CM/SW Discharge Note   Patient Details  Name: Mary [name] MRN: [id number] Date of Birth: 1950-12-15  Transition of [facility]) CM/SW Contact:  Kermit Balo, RN Phone Number: 02/02/2023, 12:05 PM   Clinical Narrative:    Pt is discharging to CIR today. CM signing off.    Final next level of care: IP Rehab Facility Barriers to Discharge: No Barriers Identified   Patient Goals and CMS Choice CMS Medicare.gov Compare Post Acute Care list provided to:: Patient Choice offered to / list presented to : Patient  Discharge Placement                         Discharge Plan and Services Additional resources added to the After Visit Summary for                                       Social Determinants of Health (SDOH) Interventions SDOH Screenings   Food Insecurity: No Food Insecurity (01/29/2023)  Housing: Low Risk  (01/29/2023)  Transportation Needs: No Transportation Needs (01/29/2023)  Utilities: Not At Risk (01/29/2023)  Alcohol Screen: Low Risk  (07/06/2022)  Depression (PHQ2-9): Low Risk  (01/04/2023)  Financial Resource Strain: Low Risk  (02/24/2022)  Physical Activity: Sufficiently Active (02/24/2022)  Social Connections: Socially Isolated (02/24/2022)  Stress: No Stress Concern Present (02/24/2022)  Tobacco Use: High Risk (01/29/2023)     Readmission Risk Interventions     No data to display

## 2023-02-02 NOTE — Progress Notes (Signed)
Right upper extremity venous duplex has been completed. Preliminary results can be found in CV Proc through chart review.   02/02/23 1:59 PM Olen Cordial RVT

## 2023-02-02 NOTE — H&P (Signed)
Physical Medicine and Rehabilitation Admission H&P    Chief Complaint  Patient presents with   Code Stroke  : HPI: Mary [name] is a 72 year old left-handed female with past medical history of PAD and had been placed on Coumadin per vascular surgery in [facility] 2021, tobacco use, CAD, hypertension, hyperlipidemia, .  Per chart review patient lives with spouse.  1 level home 3 steps to entry.  Independent prior to admission.  Presented 01/27/2023 with acute left-sided weakness, right forced gaze and left neglect.  Noted blood pressure 171/75.  Admission chemistries unremarkable except potassium 3.0.  CTH negative but CTA demonstrated right carotid mural thrombus with high-grade stenosis.  Echocardiogram with ejection fraction of 55 to 60% no wall motion abnormalities grade 1 diastolic dysfunction.  Patient not a TNK candidate because of Coumadin with INR 2.4 on admission.  Underwent right ICA thrombectomy per interventional radiology.  MRI performed demonstrated scattered infarcts throughout the right cerebral hemisphere with a focus in the right frontal and parietal cortex, watershed territory, right caudate and lentiform nucleus, and right occipital lobe.  Neurology follow-up placed on low-dose aspirin 81 mg daily and Plavix 75 mg daily.  There is no plan to resume chronic Coumadin therapy that she had been placed on in 2021 for peripheral arterial disease and this was discussed at length with vascular surgery.  Patient initially on Cleviprex for blood pressure control and since discontinued.  She is tolerating a regular consistency diet.  Therapy evaluations completed due to patient decreased functional mobility left-sided weakness was admitted for a comprehensive rehab program.   Pt reports LBM 2 days ago- denies constipation. Usually goes daily Voiding well Denies pain.   Review of Systems  Constitutional:  Negative for chills and fever.  HENT:  Negative for hearing loss.    Eyes:  Negative for blurred vision and double vision.  Respiratory:  Negative for cough, shortness of breath and wheezing.   Cardiovascular:  Negative for chest pain, palpitations and leg swelling.  Gastrointestinal:  Positive for constipation. Negative for nausea and vomiting.  Genitourinary:  Negative for dysuria, flank pain and hematuria.  Musculoskeletal:  Positive for myalgias.  Skin:  Negative for rash.  Neurological:  Positive for weakness.  All other systems reviewed and are negative.  Past Medical History:  Diagnosis Date   Arthritis    Hyperlipidemia    Hypertension    Hyperuricemia    Osteoporosis    PAD (peripheral artery disease) (HCC) 02/2010   angiplasty, popliteal artery   Tobacco abuse    Past Surgical History:  Procedure Laterality Date   ANGIOPLASTY  July 2011   PAD- popliteal artery   COLONOSCOPY WITH PROPOFOL N/A 01/13/2018   Procedure: COLONOSCOPY WITH PROPOFOL;  Surgeon: Toney Reil, MD;  Location: [facility] ENDOSCOPY;  Service: Gastroenterology;  Laterality: N/A;   COLONOSCOPY WITH PROPOFOL N/A 01/14/2018   Procedure: COLONOSCOPY WITH PROPOFOL;  Surgeon: Pasty Spillers, MD;  Location: [facility] ENDOSCOPY;  Service: Endoscopy;  Laterality: N/A;   COLONOSCOPY WITH PROPOFOL N/A 07/06/2018   Procedure: COLONOSCOPY WITH PROPOFOL;  Surgeon: Pasty Spillers, MD;  Location: [facility] ENDOSCOPY;  Service: Endoscopy;  Laterality: N/A;   CORONARY ANGIOPLASTY     IR CT HEAD LTD  01/27/2023   IR PERCUTANEOUS ART THROMBECTOMY/INFUSION INTRACRANIAL INC DIAG ANGIO  01/27/2023   JOINT REPLACEMENT     Leg Bypass Surgery Right 08/2010   OVARIAN CYST SURGERY  1980s   POPLITEAL ARTERY STENT Right 05/2010   POPLITEAL  ARTERY STENT Right 07/2010   RADIOLOGY WITH ANESTHESIA N/A 01/27/2023   Procedure: IR WITH ANESTHESIA;  Surgeon: Radiologist, Medication, MD;  Location: [facility] OR;  Service: Radiology;  Laterality: N/A;   TOTAL HIP ARTHROPLASTY Left 10/27/2017   Procedure: TOTAL HIP  ARTHROPLASTY;  Surgeon: Deeann Saint, MD;  Location: [facility] ORS;  Service: Orthopedics;  Laterality: Left;   Family History  Problem Relation Age of Onset   Arthritis Mother    Diabetes Mother    Stroke Mother    Hypertension Mother    Dementia Mother    Cancer Father    Arthritis Sister    Seizures Sister    Diabetes Sister    Hypertension Sister    COPD Sister    Cancer Brother    Stroke Maternal Grandmother    Diabetes Paternal Grandmother    Cancer Sister    Diabetes Sister    Arthritis Sister    Diabetes Brother    Stroke Brother    Healthy Brother    Diabetes Brother    Stroke Brother    Cancer Brother    Heart disease Neg Hx    Breast cancer Neg Hx    Social History:  reports that she has been smoking cigarettes. She has a 20.00 pack-year smoking history. She has never used smokeless tobacco. She reports that she does not drink alcohol and does not use drugs. Allergies: No Known Allergies Medications Prior to Admission  Medication Sig Dispense Refill   amLODipine (NORVASC) 5 MG tablet TAKE 1 TABLET EVERY DAY 90 tablet 1   atorvastatin (LIPITOR) 10 MG tablet TAKE 1 TABLET AT BEDTIME 90 tablet 0   cholecalciferol (VITAMIN D3) 25 MCG (1000 UNIT) tablet Take 2 tablets (2,000 Units total) by mouth daily.     vitamin E 400 UNIT capsule Take 180 Units by mouth daily.      warfarin (COUMADIN) 3 MG tablet Take 1 tablet (3 mg) by mouth one day a week, other 6 days take 5 mg tab poqd 15 tablet 0   warfarin (COUMADIN) 5 MG tablet Take 1 tab (5 mg) po daily, 6 days a week, and one day a week take 3 mg tab 60 tablet 0   acetaminophen (TYLENOL) 500 MG tablet Take 500 mg by mouth 2 (two) times daily as needed for moderate pain or headache.     nicotine (NICODERM CQ - DOSED IN MG/24 HR) 7 mg/24hr patch Place 1 patch (7 mg total) onto the skin daily. (Patient not taking: Reported on 01/27/2023) 28 patch 2      Home: Home Living Family/patient expects to be discharged to::  Inpatient rehab Living Arrangements: Spouse/significant other Available Help at Discharge: Family, Available 24 hours/day Type of Home: House Home Access: Stairs to enter [employer] of Steps: 3 Entrance Stairs-Rails: Left, Right Home Layout: One level Bathroom Shower/Tub: Engineer, manufacturing systems: Standard Bathroom Accessibility: Yes Home Equipment: Agricultural consultant (2 wheels)  Lives With: Spouse   Functional History: Prior Function Prior Level of Function : Independent/Modified Independent, Driving Mobility Comments: No AD ADLs Comments: Indep in ADL and IADL  Functional Status:  Mobility: Bed Mobility Overal bed mobility: Modified Independent Bed Mobility: Sit to Supine Supine to sit: Min guard Sit to supine: Modified independent (Device/Increase time) General bed mobility comments: Increased time for BLE navigation to EOB. Transfers Overall transfer level: Needs assistance Equipment used: Rolling walker (2 wheels) Transfers: Sit to/from Stand Sit to Stand: Min guard Bed to/from chair/wheelchair/BSC transfer type:: Stand pivot  Stand pivot transfers: Mod assist General transfer comment: Slow to rise, increased time for anterior weight shift Ambulation/Gait Ambulation/Gait assistance: Min guard, Min assist Gait Distance (Feet): 120 Feet Assistive device: Rolling walker (2 wheels) Gait Pattern/deviations: Step-through pattern, Decreased stride length, Narrow base of support, Drifts right/left (L drift) General Gait Details: Pt with slow pace using RW, L drift corrected through verbal cueing. Infrequent events of steps crossing midline. Gait velocity: decreased Gait velocity interpretation: <1.8 ft/sec, indicate of risk for recurrent falls Pre-gait activities: 2x HHA dynamic weight shifting: 3 colored notes on the floor to which pt was asked to toe tap with their LLE, all notes positioned past L midline to facilitate L attention. Sidestepping EOB. Stairs:  Yes Stairs assistance: Min guard Stair Management: One rail Left, One rail Right Number of Stairs: 5 General stair comments: Cues for attending to where LUE is on railing (tendency to leave it behind her)    ADL: ADL Overall ADL's : Needs assistance/impaired Eating/Feeding: Set up, Sitting Grooming: Minimal assistance, Standing, Applying deodorant, Wash/dry face, Wash/dry hands Grooming Details (indicate cue type and reason): perseverating some with washing hands; cueing needed to locate body wash as pt attempting to place lotion on washcloth in sink. Cues needed to locate sink then scan to L side to find deodorant - pt looking more on R side for items Upper Body Bathing: Minimal assistance, Standing Upper Body Bathing Details (indicate cue type and reason): washing underarms in standing, assist with clothing and problem solving ability to reach Lower Body Bathing: Min guard, Sit to/from stand Lower Body Bathing Details (indicate cue type and reason): able to bathe peri region in standing without assist Upper Body Dressing : Minimal assistance Lower Body Dressing: Min guard, Sit to/from stand, Sitting/lateral leans Lower Body Dressing Details (indicate cue type and reason): sock mgmt Toilet Transfer: Moderate assistance, +2 for physical assistance, +2 for safety/equipment, Stand-pivot, Ambulation Toilet Transfer Details (indicate cue type and reason): 2 person HHA; LLE incoordination stepping to [facility] Toileting- Clothing Manipulation and Hygiene: Sit to/from stand, Moderate assistance Toileting - Clothing Manipulation Details (indicate cue type and reason): for STS; pt able to lean forward in standing and wipe self. Functional mobility during ADLs: Minimal assistance, Rolling walker (2 wheels) General ADL Comments: limited by L inattention, focus on scanning sink for ADL items with use of sink as anchor  Cognition: Cognition Overall Cognitive Status: Impaired/Different from  baseline Arousal/Alertness: Awake/alert Orientation Level: Oriented X4 Year: 2024 Month: May Day of Week: Correct Attention: Focused, Sustained Focused Attention: Appears intact Sustained Attention: Appears intact Memory: Impaired Memory Impairment: Decreased short term memory Awareness: Impaired Problem Solving: Impaired Problem Solving Impairment: Verbal basic Executive Function: Reasoning Reasoning: Appears intact Cognition Arousal/Alertness: Awake/alert Behavior During Therapy: WFL for tasks assessed/performed Overall Cognitive Status: Impaired/Different from baseline Area of Impairment: Following commands, Safety/judgement, Awareness, Memory, Attention, Problem solving Current Attention Level: Selective Memory: Decreased short-term memory Following Commands: Follows one step commands with increased time Safety/Judgement: Decreased awareness of deficits, Decreased awareness of safety Awareness: Emergent Problem Solving: Requires verbal cues General Comments: pleasant, appropriate in conversation and follows commands consistently. increased cueing needed due to L inattention ( to scan, awareness of what certain ADL bottles were on sink). question some memory deficits as pt reported having pancakes for lunch but unsure what she had for breakfast  Physical Exam: Blood pressure 125/72, pulse 69, temperature 98.3 F (36.8 C), temperature source Oral, resp. rate 16, height 5\' 4"  (1.626 m), weight 51.1 kg, SpO2 99 %.  Physical Exam Constitutional:      Appearance: Normal appearance. She is ill-appearing.     Comments: BMI 19; sitting up eating lunch- appropriate; alert, NAD  HENT:     Head: Normocephalic and atraumatic.     Comments: L facial droop Missing most of teeth Tongue midline    Right Ear: External ear normal.     Left Ear: External ear normal.     Nose: Nose normal. No congestion.     Mouth/Throat:     Mouth: Mucous membranes are moist.     Pharynx: Oropharynx is  clear. No oropharyngeal exudate.  Eyes:     General:        Right eye: No discharge.        Left eye: No discharge.     Extraocular Movements: Extraocular movements intact.  Cardiovascular:     Rate and Rhythm: Normal rate and regular rhythm.     Heart sounds: Normal heart sounds. No murmur heard.    No gallop.  Pulmonary:     Effort: Pulmonary effort is normal. No respiratory distress.     Breath sounds: Normal breath sounds. No wheezing, rhonchi or rales.  Abdominal:     General: Bowel sounds are normal. There is no distension.     Palpations: Abdomen is soft.     Tenderness: There is no abdominal tenderness.  Musculoskeletal:     Cervical back: Neck supple. No tenderness.     Comments: RUE/RLE- 5/5- in biceps, triceps, WE, grip and FA; as well as HF, KE, KF, DF and PF LUE 4+/5 in muscles tested and LLE 5-/5 in same muscles tested  Skin:    General: Skin is warm and dry.     Comments: IV L forearm- looks OK  Neurological:     Mental Status: She is alert.     Comments: Patient is alert.  She does have a right gaze preference and left-sided inattention with mild left facial droop.  Follows commands.  Voice is soft but she can provide her name and age. Has R gaze preference, but can look past midline with cues Intact to light touch in all 4 extremities  Psychiatric:        Mood and Affect: Mood normal.        Behavior: Behavior normal.     Results for orders placed or performed during the hospital encounter of 01/27/23 (from the past 48 hour(s))  Protime-INR     Status: Abnormal   Collection Time: 02/01/23  7:49 AM  Result Value Ref Range   Prothrombin Time 30.3 (H) 11.4 - 15.2 seconds   INR 2.9 (H) 0.8 - 1.2    Comment: (NOTE) INR goal varies based on device and disease states. Performed at [facility], [address], [place], [place] [address]    No results found.    Blood pressure 125/72, pulse 69, temperature 98.3 F (36.8 C), temperature source Oral,  resp. rate 16, height 5\' 4"  (1.626 m), weight 51.1 kg, SpO2 99 %.  Medical Problem List and Plan: 1. 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  Status post thrombectomy.  -patient may  shower  -ELOS/Goals: 11-15 days- supervision 2.  Antithrombotics: -DVT/anticoagulation:  Mechanical:  Antiembolism stockings, knee (TED hose) Bilateral lower extremities  -antiplatelet therapy: Aspirin 81 mg daily and Plavix 75 mg daily 3. Pain Management: Tylenol as needed 4. Mood/Behavior/Sleep: Provide emotional support  -antipsychotic agents: N/A  5. Neuropsych/cognition: This patient is capable of making decisions on her own behalf. 6. Skin/Wound Care: Routine skin checks 7. Fluids/Electrolytes/Nutrition: Routine in and outs with follow-up chemistries 8.  Tobacco abuse.  NicoDerm patch.  Provide counseling 9.  Hyperlipidemia.  Lipitor 10.  PAD.  Patient started on Coumadin many years ago by vascular surgery in [facility] for peripheral arterial disease.  She had not seen vascular surgery for 3 years.  Discussed at length with vascular surgery and chronic Coumadin has been discontinued no plan to resume. 11. Mild constipation- LBM 2 days ago- might need intervention 12. Prediabetes- A1c 6.2- monitor with BMP's and might need additional monitoring.    Mcarthur Rossetti Angiulli, PA-C 02/02/2023   I have personally performed a face to face diagnostic evaluation of this patient and formulated the key components of the plan.  Additionally, I have personally reviewed laboratory data, imaging studies, as well as relevant notes and concur with the physician assistant's documentation above.   The patient's status has not changed from the original H&P.  Any changes in documentation from the acute care chart have been noted above.

## 2023-02-02 NOTE — Discharge Instructions (Addendum)
Inpatient Rehab Discharge Instructions  AZALIE HARBECK Discharge date and time: No discharge date for patient encounter.   Activities/Precautions/ Functional Status: Activity: activity as tolerated Diet: regular diet Wound Care: Routine skin checks Functional status:  ___ No restrictions     ___ Walk up steps independently ___ 24/7 supervision/assistance   ___ Walk up steps with assistance ___ Intermittent supervision/assistance  ___ Bathe/dress independently ___ Walk with walker     _x__ Bathe/dress with assistance ___ Walk Independently    ___ Shower independently ___ Walk with assistance    ___ Shower with assistance ___ No alcohol     ___ Return to work/school ________  COMMUNITY REFERRALS UPON DISCHARGE:    Home Health:   PT     OT     ST                   Agency: [facility] Phone: [phone]      Special Instructions: No driving smoking or alcoholSTROKE/TIA DISCHARGE INSTRUCTIONS SMOKING Cigarette smoking nearly doubles your risk of having a stroke & is the single most alterable risk factor  If you smoke or have smoked in the last 12 months, you are advised to quit smoking for your health. Most of the excess cardiovascular risk related to smoking disappears within a year of stopping. Ask you doctor about anti-smoking medications [place] Quit Line: 1-800-QUIT NOW Free Smoking Cessation Classes [phone]  CHOLESTEROL Know your levels; limit fat & cholesterol in your diet  Lipid Panel     Component Value Date/Time   CHOL 115 01/28/2023 0250   CHOL 145 10/17/2015 1017   TRIG 57 01/28/2023 0250   HDL 51 01/28/2023 0250   HDL 43 10/17/2015 1017   CHOLHDL 2.3 01/28/2023 0250   VLDL 11 01/28/2023 0250   LDLCALC 53 01/28/2023 0250   LDLCALC 55 10/06/2022 1038     Many patients benefit from treatment even if their cholesterol is at goal. Goal: Total Cholesterol (CHOL) less than 160 Goal:  Triglycerides (TRIG) less than 150 Goal:  HDL greater than 40 Goal:  LDL (LDLCALC)  less than 100   BLOOD PRESSURE American Stroke Association blood pressure target is less that 120/80 mm/Hg  Your discharge blood pressure is:    Monitor your blood pressure Limit your salt and alcohol intake Many individuals will require more than one medication for high blood pressure  DIABETES (A1c is a blood sugar average for last 3 months) Goal HGBA1c is under 7% (HBGA1c is blood sugar average for last 3 months)  Diabetes: No known diagnosis of diabetes    Lab Results  Component Value Date   HGBA1C 6.2 (H) 01/28/2023    Your HGBA1c can be lowered with medications, healthy diet, and exercise. Check your blood sugar as directed by your physician Call your physician if you experience unexplained or low blood sugars.  PHYSICAL ACTIVITY/REHABILITATION Goal is 30 minutes at least 4 days per week  Activity: Increase activity slowly, Therapies: Physical Therapy: Home Health Return to work:  Activity decreases your risk of heart attack and stroke and makes your heart stronger.  It helps control your weight and blood pressure; helps you relax and can improve your mood. Participate in a regular exercise program. Talk with your doctor about the best form of exercise for you (dancing, walking, swimming, cycling).  DIET/WEIGHT Goal is to maintain a healthy weight  Your discharge diet is:  Diet Order  Diet regular Room service appropriate? No; Fluid consistency: Thin  Diet effective now                   liquids Your height is:    Your current weight is:   Your Body Mass Index (BMI) is:    Following the type of diet specifically designed for you will help prevent another stroke. Your goal weight range is:   Your goal Body Mass Index (BMI) is 19-24. Healthy food habits can help reduce 3 risk factors for stroke:  High cholesterol, hypertension, and excess weight.  RESOURCES Stroke/Support Group:  Call [phone]   STROKE EDUCATION PROVIDED/REVIEWED AND GIVEN TO PATIENT  Stroke warning signs and symptoms How to activate emergency medical system (call 911). Medications prescribed at discharge. Need for follow-up after discharge. Personal risk factors for stroke. Pneumonia vaccine given: No Flu vaccine given: No My questions have been answered, the writing is legible, and I understand these instructions.  I will adhere to these goals & educational materials that have been provided to me after my discharge from the hospital.      My questions have been answered and I understand these instructions. I will adhere to these goals and the provided educational materials after my discharge from the hospital.  Patient/Caregiver Signature _______________________________ Date __________  Clinician Signature _______________________________________ Date __________  Please bring this form and your medication list with you to all your follow-up doctor's appointments.

## 2023-02-02 NOTE — Progress Notes (Signed)
Patient ID: Mary [name], female   DOB: 10-Nov-1950, 72 y.o.   MRN: [id number] Met with the patient to review current situation, rehab process, team conference and plan of care. Reviewed secondary risk management including HTN on Norvasc, HLD (LDL 53/Tri 57) on Lipitor CAD on ASA+ Plavix and Smoking. Patient noted an understanding of the effects of smoking and steps for cessation. Reviewed medications and dietary modification tips for prediabetes; patient consumes a lot of sugary/starchy foods on a daily basis. Reviewed increased vegetables and protein in her diet along with vitamin D and calcium rich foods. Continue to follow along to address educational  needs to facilitate preparation for discharge. Pamelia Hoit

## 2023-02-02 NOTE — Progress Notes (Signed)
Inpatient Rehabilitation Admissions Coordinator  I have insurance approval and CIR bed to admit her to today I met with patient at bedside and she is aware and in agreement. Dr Pearlean Brownie aware. I will notify acute team and TOC and make the arrangements to admit today.  Ottie Glazier, RN, MSN Rehab Admissions Coordinator [phone] 02/02/2023 10:35 AM

## 2023-02-02 NOTE — Discharge Summary (Addendum)
Stroke Discharge Summary  Patient ID: Mary [name]   MRN: [id number]      DOB: [birth year]  Date of Admission: 01/27/2023 Date of Discharge: 02/02/2023  Attending Physician:  Stroke, Md, MD, Stroke MD Consultant(s):   None Patient's PCP:  Danelle Berry, PA-C  Discharge Diagnoses: Stroke:  right MCA and MCA/ACA  from the intramural right ICA thrombus, likely from underlying large vessel disease even on coumadin with therapeutic INR s/p successful mechanical thrombectomy with underlying 55% right ICA stenosis  Principal Problem:   Stroke [facility]) Active Problems:   Internal carotid artery occlusion, right Left hemiparesis Hypertension Hyperlipidemia Right carotid stenosis with thrombus   Medications to be continued on Rehab Allergies as of 02/02/2023   No Known Allergies      Medication List     STOP taking these medications    warfarin 3 MG tablet Commonly known as: COUMADIN   warfarin 5 MG tablet Commonly known as: COUMADIN       TAKE these medications    acetaminophen 500 MG tablet Commonly known as: TYLENOL Take 500 mg by mouth 2 (two) times daily as needed for moderate pain or headache.   amLODipine 5 MG tablet Commonly known as: NORVASC TAKE 1 TABLET EVERY DAY   aspirin 81 MG chewable tablet Chew 1 tablet (81 mg total) by mouth daily. Start taking on: February 03, 2023   atorvastatin 10 MG tablet Commonly known as: LIPITOR TAKE 1 TABLET AT BEDTIME   cholecalciferol 25 MCG (1000 UNIT) tablet Commonly known as: VITAMIN D3 Take 2 tablets (2,000 Units total) by mouth daily.   clopidogrel 75 MG tablet Commonly known as: PLAVIX Take 1 tablet (75 mg total) by mouth daily. Start taking on: February 03, 2023   nicotine 7 mg/24hr patch Commonly known as: NICODERM CQ - dosed in mg/24 hr Place 1 patch (7 mg total) onto the skin daily.   vitamin E 180 MG (400 UNITS) capsule Take 180 Units by mouth daily.        LABORATORY STUDIES CBC    Component Value  Date/Time   WBC 6.5 01/28/2023 0250   RBC 4.06 01/28/2023 0250   HGB 11.9 (L) 01/28/2023 0250   HGB 13.8 10/17/2015 1017   HCT 35.0 (L) 01/28/2023 0250   HCT 39.3 10/17/2015 1017   PLT 201 01/28/2023 0250   PLT 315 10/17/2015 1017   MCV 86.2 01/28/2023 0250   MCV 85 10/17/2015 1017   MCH 29.3 01/28/2023 0250   MCHC 34.0 01/28/2023 0250   RDW 13.3 01/28/2023 0250   RDW 14.1 10/17/2015 1017   LYMPHSABS 1.6 01/28/2023 0250   LYMPHSABS 3.7 (H) 10/17/2015 1017   MONOABS 0.4 01/28/2023 0250   EOSABS 0.0 01/28/2023 0250   EOSABS 0.2 10/17/2015 1017   BASOSABS 0.0 01/28/2023 0250   BASOSABS 0.0 10/17/2015 1017   CMP    Component Value Date/Time   NA 134 (L) 01/28/2023 0250   NA 142 10/17/2015 1017   NA 138 01/21/2012 0740   K 3.5 01/28/2023 0250   K 2.8 (L) 01/21/2012 0740   CL 101 01/28/2023 0250   CL 98 01/21/2012 0740   CO2 23 01/28/2023 0250   CO2 30 01/21/2012 0740   GLUCOSE 113 (H) 01/28/2023 0250   GLUCOSE 117 (H) 01/21/2012 0740   BUN 8 01/28/2023 0250   BUN 6 (L) 10/17/2015 1017   BUN 9 01/21/2012 0740   CREATININE 0.75 01/28/2023 0250   CREATININE 0.93 01/04/2023 1127  CALCIUM 8.4 (L) 01/28/2023 0250   CALCIUM 9.3 01/21/2012 0740   PROT 7.4 01/27/2023 1126   PROT 7.0 10/17/2015 1017   ALBUMIN 3.8 01/27/2023 1126   ALBUMIN 4.1 10/17/2015 1017   AST 24 01/27/2023 1126   ALT 16 01/27/2023 1126   ALKPHOS 70 01/27/2023 1126   BILITOT 0.4 01/27/2023 1126   BILITOT 0.3 10/17/2015 1017   GFRNONAA >60 01/28/2023 0250   GFRNONAA 77 01/09/2021 1133   GFRAA 90 01/09/2021 1133   COAGS Lab Results  Component Value Date   INR 7.2 (HH) 02/02/2023   INR 2.9 (H) 02/01/2023   INR 2.7 (H) 01/31/2023   Lipid Panel    Component Value Date/Time   CHOL 115 01/28/2023 0250   CHOL 145 10/17/2015 1017   TRIG 57 01/28/2023 0250   HDL 51 01/28/2023 0250   HDL 43 10/17/2015 1017   CHOLHDL 2.3 01/28/2023 0250   VLDL 11 01/28/2023 0250   LDLCALC 53 01/28/2023 0250    LDLCALC 55 10/06/2022 1038   HgbA1C  Lab Results  Component Value Date   HGBA1C 6.2 (H) 01/28/2023   Urinalysis    Component Value Date/Time   COLORURINE YELLOW 02/22/2018 0903   APPEARANCEUR CLOUDY (A) 02/22/2018 0903   LABSPEC 1.017 02/22/2018 0903   PHURINE 6.5 02/22/2018 0903   GLUCOSEU NEGATIVE 02/22/2018 0903   HGBUR NEGATIVE 02/22/2018 0903   BILIRUBINUR NEGATIVE 10/30/2017 1935   KETONESUR NEGATIVE 02/22/2018 0903   PROTEINUR 1+ (A) 02/22/2018 0903   NITRITE NEGATIVE 10/30/2017 1935   LEUKOCYTESUR NEGATIVE 10/30/2017 1935   Urine Drug Screen No results found for: "LABOPIA", "COCAINSCRNUR", "LABBENZ", "AMPHETMU", "THCU", "LABBARB"  Alcohol Level    Component Value Date/Time   ETH <10 01/27/2023 1126     SIGNIFICANT DIAGNOSTIC STUDIES VAS US CAROTID  Result Date: 01/29/2023 Carotid Arterial Duplex Study Patient Name:  Mary [name]  Date of Exam:   01/28/2023 Medical Rec #: [id number]      Accession #:    [id number] Date of Birth: September 22, 1950       Patient Gender: F Patient Age:   34 years Exam Location:  [facility] Procedure:      VAS US CAROTID Referring Phys: Scheryl Marten XU --------------------------------------------------------------------------------  Indications:       CVA, Weakness and right carotid mural thrombus, status post                    thrombectomy with residual 55-60% ICA stenosis. Risk Factors:      Hypertension, hyperlipidemia, current smoker, coronary artery                    disease, PAD. Other Factors:     On Coumadin for clotting disorder. Comparison Study:  No prior study Performing Technologist: Sherren Kerns RVS  Examination Guidelines: A complete evaluation includes B-mode imaging, spectral Doppler, color Doppler, and power Doppler as needed of all accessible portions of each vessel. Bilateral testing is considered an integral part of a complete examination. Limited examinations for reoccurring indications may be performed as noted.  Right Carotid  Findings: +----------+--------+--------+--------+------------------+------------------+           PSV cm/sEDV cm/sStenosisPlaque DescriptionComments           +----------+--------+--------+--------+------------------+------------------+ CCA Prox  89      17                                intimal  thickening +----------+--------+--------+--------+------------------+------------------+ CCA Mid                           focal and calcific                   +----------+--------+--------+--------+------------------+------------------+ CCA Distal82      17                                intimal thickening +----------+--------+--------+--------+------------------+------------------+ ICA Prox  77      20      1-39%   calcific          Shadowing          +----------+--------+--------+--------+------------------+------------------+ ICA Mid   108     20                                                   +----------+--------+--------+--------+------------------+------------------+ ICA Distal91      27                                                   +----------+--------+--------+--------+------------------+------------------+ ECA       117     11                                                   +----------+--------+--------+--------+------------------+------------------+ +----------+--------+-------+--------+-------------------+           PSV cm/sEDV cmsDescribeArm Pressure (mmHG) +----------+--------+-------+--------+-------------------+ Subclavian70                                         +----------+--------+-------+--------+-------------------+ +---------+--------+--+--------+--+ VertebralPSV cm/s70EDV cm/s14 +---------+--------+--+--------+--+  Left Carotid Findings: +----------+--------+--------+--------+------------------+------------------+           PSV cm/sEDV cm/sStenosisPlaque DescriptionComments            +----------+--------+--------+--------+------------------+------------------+ CCA Prox  85      21                                intimal thickening +----------+--------+--------+--------+------------------+------------------+ CCA Distal81      16                                intimal thickening +----------+--------+--------+--------+------------------+------------------+ ICA Prox  82      23      1-39%   calcific                             +----------+--------+--------+--------+------------------+------------------+ ICA Mid   77      21                                                   +----------+--------+--------+--------+------------------+------------------+  ICA Distal75      23                                                   +----------+--------+--------+--------+------------------+------------------+ ECA       77      12                                                   +----------+--------+--------+--------+------------------+------------------+ +----------+--------+--------+--------+-------------------+           PSV cm/sEDV cm/sDescribeArm Pressure (mmHG) +----------+--------+--------+--------+-------------------+ [id number]                                          +----------+--------+--------+--------+-------------------+ +---------+--------+--+--------+--+ VertebralPSV cm/s72EDV cm/s16 +---------+--------+--+--------+--+   Summary: Right Carotid: Velocities in the right ICA are consistent with a 1-39% stenosis. Left Carotid: Velocities in the left ICA are consistent with a 1-39% stenosis. Vertebrals:  Bilateral vertebral arteries demonstrate antegrade flow. Subclavians: Normal flow hemodynamics were seen in bilateral subclavian              arteries. *See table(s) above for measurements and observations.  Electronically signed by Delia Heady MD on 01/29/2023 at 9:23:08 AM.    Final    IR PERCUTANEOUS ART THROMBECTOMY/INFUSION  INTRACRANIAL INC DIAG ANGIO  Result Date: 01/29/2023 INDICATION: New onset right gaze deviation and left-sided neglect, and left hemiparesis. Near occlusive prominent thrombus in the right internal carotid artery. EXAM: 1. EMERGENT LARGE VESSEL OCCLUSION THROMBOLYSIS (anterior CIRCULATION) COMPARISON:  CT angiogram of the head and neck of Jan 27, 2023. MEDICATIONS: Ancef 2 g IV antibiotic was administered within 1 hour of the procedure. ANESTHESIA/SEDATION: General anesthesia. CONTRAST:  Omnipaque 300 approximately 80 mL. FLUOROSCOPY TIME:  Fluoroscopy Time: 17 minutes 24 seconds (947 mGy). COMPLICATIONS: None immediate. TECHNIQUE: Following a full explanation of the procedure along with the potential associated complications, an informed witnessed consent was obtained. The risks of intracranial hemorrhage of 10%, worsening neurological deficit, ventilator dependency, death and inability to revascularize were all reviewed in detail with the patient. The patient was then put under general anesthesia by the Department of Anesthesiology at [facility]. The right groin was prepped and draped in the usual sterile fashion. Thereafter using modified Seldinger technique, transfemoral access into the right common femoral artery was obtained without difficulty. Over an 0.035 inch guidewire an 8 French 25 cm Arrow sheath was inserted. Through this, and also over an 0.035 inch guidewire a combination of a Berenstein 125 cm 5.5 [place] support catheter inside of an 087 95 cm balloon guide catheter was advanced to the right common carotid artery. The guidewire, and the support catheter were removed. Arteriograms were then performed through the balloon guide catheter extra cranially and intracranially. FINDINGS: The right common carotid bifurcation demonstrates the right external carotid artery and its major branches to be widely patent. The right internal carotid artery at the bulb has a circumferential atherosclerotic  plaque associated with an approximately 3 cm irregular filling defect nearly occluding the internal carotid artery. More distally, patency is maintained of the distal cervical right ICA, the petrous, the cavernous and the supraclinoid  right ICA. A right posterior communicating artery is seen opacifying the right posterior cerebral artery distribution. The right middle cerebral artery opacifies into the capillary and venous phases. A hypoplastic right A1 segment is noted with continuation into the A2 region. PROCEDURE: Through the balloon guide catheter, an 021 150 cm microcatheter was advanced over an 014 inch soft tip micro guidewire with a moderate J configuration to just proximal to the right internal carotid artery origin. At this time, proximal flow arrest was initiated by inflating the balloon guide catheter in the distal right common carotid artery. Also a combination of the micro guidewire and microcatheter was advanced through the internal carotid artery, slight proximal aspiration was applied with a 20 mL syringe as the combination was navigated to the petrous segment of the right ICA. The aspiration was then stopped. The micro guidewire was removed. A 6.5 mm x 47 mm Embotrap retrieved retrieval device was then advanced to the distal end of the microcatheter and deployed such that the proximal portion of the device was in the distal cervical right ICA. The microcatheter was removed. With the proximal flow arrest intact, proximal aspiration was then performed at the hub of the balloon guide catheter with a Penumbra aspiration device, as the retriever was removed. An 071 Zoom aspiration catheter was then advanced over an 014 microcatheter as aspiration was performed. The 071 Zoom aspiration catheter was advanced into the right internal carotid artery proximally and distally and then distal to proximal flow arrest still intact. Thick irregular grayish white clot fragments were retained in the retriever, and  in the canister. Following reversal of flow arrest in the right common carotid artery, a control arteriogram performed through the balloon guide catheter revealed no residual filling defects in the right internal carotid artery extra cranially and intracranially. Right MCA remained widely patent without evidence of intraluminal filling defects, or of occlusions. The non dominant right A1 and A2 segments also demonstrated no changes. The balloon guide wire was removed. Manual compression was applied with quick clot for 25 minutes to obtain hemostasis at the right groin puncture site. Distal pulses remained Dopplerable in both feet unchanged at the end of procedure. A flat panel CT of the brain demonstrated no evidence of intracranial hemorrhage. The patient's general anesthesia was then reversed and the patient was extubated. Upon recovery, the patient was able to obey simple commands though had no movement in her left arm and leg. She was then transferred to the neuro ICU for post revascularization care. IMPRESSION: Status post right ICA extra cranially with 1 pass with a 6.5 mm x 47 mm Emboshield retrieval device and aspiration achieving complete revascularization. Approximately 65% stenosis of the right internal carotid artery at the bulb. PLAN: We will probably need placement of a stent at the right internal carotid artery proximally to prevent future ischemic strokes in the right MCA distribution. Electronically Signed   By: Julieanne Cotton M.D.   On: 01/29/2023 08:42   IR CT [facility]  Result Date: 01/29/2023 INDICATION: New onset right gaze deviation and left-sided neglect, and left hemiparesis. Near occlusive prominent thrombus in the right internal carotid artery. EXAM: 1. EMERGENT LARGE VESSEL OCCLUSION THROMBOLYSIS (anterior CIRCULATION) COMPARISON:  CT angiogram of the head and neck of Jan 27, 2023. MEDICATIONS: Ancef 2 g IV antibiotic was administered within 1 hour of the procedure.  ANESTHESIA/SEDATION: General anesthesia. CONTRAST:  Omnipaque 300 approximately 80 mL. FLUOROSCOPY TIME:  Fluoroscopy Time: 17 minutes 24 seconds (947 mGy). COMPLICATIONS: None immediate.  TECHNIQUE: Following a full explanation of the procedure along with the potential associated complications, an informed witnessed consent was obtained. The risks of intracranial hemorrhage of 10%, worsening neurological deficit, ventilator dependency, death and inability to revascularize were all reviewed in detail with the patient. The patient was then put under general anesthesia by the Department of Anesthesiology at [facility]. The right groin was prepped and draped in the usual sterile fashion. Thereafter using modified Seldinger technique, transfemoral access into the right common femoral artery was obtained without difficulty. Over an 0.035 inch guidewire an 8 French 25 cm Arrow sheath was inserted. Through this, and also over an 0.035 inch guidewire a combination of a Berenstein 125 cm 5.5 [place] support catheter inside of an 087 95 cm balloon guide catheter was advanced to the right common carotid artery. The guidewire, and the support catheter were removed. Arteriograms were then performed through the balloon guide catheter extra cranially and intracranially. FINDINGS: The right common carotid bifurcation demonstrates the right external carotid artery and its major branches to be widely patent. The right internal carotid artery at the bulb has a circumferential atherosclerotic plaque associated with an approximately 3 cm irregular filling defect nearly occluding the internal carotid artery. More distally, patency is maintained of the distal cervical right ICA, the petrous, the cavernous and the supraclinoid right ICA. A right posterior communicating artery is seen opacifying the right posterior cerebral artery distribution. The right middle cerebral artery opacifies into the capillary and venous phases. A  hypoplastic right A1 segment is noted with continuation into the A2 region. PROCEDURE: Through the balloon guide catheter, an 021 150 cm microcatheter was advanced over an 014 inch soft tip micro guidewire with a moderate J configuration to just proximal to the right internal carotid artery origin. At this time, proximal flow arrest was initiated by inflating the balloon guide catheter in the distal right common carotid artery. Also a combination of the micro guidewire and microcatheter was advanced through the internal carotid artery, slight proximal aspiration was applied with a 20 mL syringe as the combination was navigated to the petrous segment of the right ICA. The aspiration was then stopped. The micro guidewire was removed. A 6.5 mm x 47 mm Embotrap retrieved retrieval device was then advanced to the distal end of the microcatheter and deployed such that the proximal portion of the device was in the distal cervical right ICA. The microcatheter was removed. With the proximal flow arrest intact, proximal aspiration was then performed at the hub of the balloon guide catheter with a Penumbra aspiration device, as the retriever was removed. An 071 Zoom aspiration catheter was then advanced over an 014 microcatheter as aspiration was performed. The 071 Zoom aspiration catheter was advanced into the right internal carotid artery proximally and distally and then distal to proximal flow arrest still intact. Thick irregular grayish white clot fragments were retained in the retriever, and in the canister. Following reversal of flow arrest in the right common carotid artery, a control arteriogram performed through the balloon guide catheter revealed no residual filling defects in the right internal carotid artery extra cranially and intracranially. Right MCA remained widely patent without evidence of intraluminal filling defects, or of occlusions. The non dominant right A1 and A2 segments also demonstrated no changes.  The balloon guide wire was removed. Manual compression was applied with quick clot for 25 minutes to obtain hemostasis at the right groin puncture site. Distal pulses remained Dopplerable in both feet unchanged at the  end of procedure. A flat panel CT of the brain demonstrated no evidence of intracranial hemorrhage. The patient's general anesthesia was then reversed and the patient was extubated. Upon recovery, the patient was able to obey simple commands though had no movement in her left arm and leg. She was then transferred to the neuro ICU for post revascularization care. IMPRESSION: Status post right ICA extra cranially with 1 pass with a 6.5 mm x 47 mm Emboshield retrieval device and aspiration achieving complete revascularization. Approximately 65% stenosis of the right internal carotid artery at the bulb. PLAN: We will probably need placement of a stent at the right internal carotid artery proximally to prevent future ischemic strokes in the right MCA distribution. Electronically Signed   By: Julieanne Cotton M.D.   On: 01/29/2023 08:42   ECHOCARDIOGRAM COMPLETE  Result Date: 01/28/2023    ECHOCARDIOGRAM REPORT   Patient Name:   BRIGITT VENNE Date of Exam: 01/28/2023 Medical Rec #:  [id number]     Height:       64.0 in Accession #:    [id number]    Weight:       112.7 lb Date of Birth:  1951-07-27      BSA:          1.533 m Patient Age:    42 years      BP:           124/71 mmHg Patient Gender: F             HR:           68 bpm. Exam Location:  Inpatient Procedure: 2D Echo, Color Doppler and Cardiac Doppler Indications:    Stroke  History:        Patient has prior history of Echocardiogram examinations and                 Patient has no prior history of Echocardiogram examinations.                 Right ICA occlusion, PAD, Smoker, HTN, HLD, CAD.  Sonographer:    Milbert Coulter Referring Phys: [id number] JINDONG XU IMPRESSIONS  1. Left ventricular ejection fraction, by estimation, is 55 to 60%. The left  ventricle has normal function. The left ventricle has no regional wall motion abnormalities. Left ventricular diastolic parameters are consistent with Grade I diastolic dysfunction (impaired relaxation).  2. Right ventricular systolic function is normal. The right ventricular size is normal. Tricuspid regurgitation signal is inadequate for assessing PA pressure.  3. The mitral valve is grossly normal. Trivial mitral valve regurgitation.  4. The aortic valve is tricuspid. There is mild calcification of the aortic valve. There is mild thickening of the aortic valve. Aortic valve regurgitation is not visualized. Aortic valve sclerosis/calcification is present, without any evidence of aortic stenosis.  5. The inferior vena cava is normal in size with greater than 50% respiratory variability, suggesting right atrial pressure of 3 mmHg. Comparison(s): No prior Echocardiogram. Conclusion(s)/Recommendation(s): No intracardiac source of embolism detected on this transthoracic study. Consider a transesophageal echocardiogram to exclude cardiac source of embolism if clinically indicated. FINDINGS  Left Ventricle: Left ventricular ejection fraction, by estimation, is 55 to 60%. The left ventricle has normal function. The left ventricle has no regional wall motion abnormalities. The left ventricular internal cavity size was normal in size. There is  no left ventricular hypertrophy. Left ventricular diastolic parameters are consistent with Grade I diastolic dysfunction (impaired relaxation). Right Ventricle: The right ventricular  size is normal. No increase in right ventricular wall thickness. Right ventricular systolic function is normal. Tricuspid regurgitation signal is inadequate for assessing PA pressure. Left Atrium: Left atrial size was normal in size. Right Atrium: Right atrial size was normal in size. Pericardium: There is no evidence of pericardial effusion. Mitral Valve: The mitral valve is grossly normal. Trivial  mitral valve regurgitation. Tricuspid Valve: The tricuspid valve is normal in structure. Tricuspid valve regurgitation is trivial. Aortic Valve: The aortic valve is tricuspid. There is mild calcification of the aortic valve. There is mild thickening of the aortic valve. Aortic valve regurgitation is not visualized. Aortic valve sclerosis/calcification is present, without any evidence of aortic stenosis. Aortic valve mean gradient measures 4.0 mmHg. Aortic valve peak gradient measures 7.3 mmHg. Aortic valve area, by VTI measures 1.23 cm. Pulmonic Valve: The pulmonic valve was not well visualized. Pulmonic valve regurgitation is trivial. Aorta: The aortic root is normal in size and structure. Venous: The inferior vena cava is normal in size with greater than 50% respiratory variability, suggesting right atrial pressure of 3 mmHg. IAS/Shunts: The atrial septum is grossly normal.  LEFT VENTRICLE PLAX 2D LVIDd:         3.60 cm     Diastology LVIDs:         2.50 cm     LV e' medial:    9.68 cm/s LV PW:         1.10 cm     LV E/e' medial:  8.9 LV IVS:        1.00 cm     LV e' lateral:   12.70 cm/s LVOT diam:     1.70 cm     LV E/e' lateral: 6.7 LV SV:         41 LV SV Index:   27 LVOT Area:     2.27 cm  LV Volumes (MOD) LV vol d, MOD A2C: 38.2 ml LV vol d, MOD A4C: 42.6 ml LV vol s, MOD A2C: 10.4 ml LV vol s, MOD A4C: 16.1 ml LV SV MOD A2C:     27.8 ml LV SV MOD A4C:     42.6 ml LV SV MOD BP:      28.3 ml RIGHT VENTRICLE RV Basal diam:  2.70 cm RV Mid diam:    2.30 cm RV S prime:     11.70 cm/s TAPSE (M-mode): 2.3 cm LEFT ATRIUM             Index        RIGHT ATRIUM           Index LA diam:        2.50 cm 1.63 cm/m   RA Area:     12.10 cm LA Vol (A2C):   34.1 ml 22.25 ml/m  RA Volume:   27.70 ml  18.07 ml/m LA Vol (A4C):   27.2 ml 17.74 ml/m LA Biplane Vol: 31.1 ml 20.29 ml/m  AORTIC VALVE AV Area (Vmax):    1.59 cm AV Area (Vmean):   1.42 cm AV Area (VTI):     1.23 cm AV Vmax:           135.00 cm/s AV Vmean:           97.300 cm/s AV VTI:            0.331 m AV Peak Grad:      7.3 mmHg AV Mean Grad:      4.0 mmHg LVOT Vmax:  94.60 cm/s LVOT Vmean:        60.700 cm/s LVOT VTI:          0.179 m LVOT/AV VTI ratio: 0.54  AORTA Ao Root diam: 3.10 cm Ao Asc diam:  2.50 cm MITRAL VALVE MV Area (PHT): 4.06 cm    SHUNTS MV Decel Time: 187 msec    Systemic VTI:  0.18 m MV E velocity: 85.70 cm/s  Systemic Diam: 1.70 cm MV A velocity: 85.70 cm/s MV E/A ratio:  1.00 Laurance Flatten MD Electronically signed by Laurance Flatten MD Signature Date/Time: 01/28/2023/3:48:06 PM    Final    MR BRAIN WO CONTRAST  Result Date: 01/27/2023 CLINICAL DATA:  Stroke, follow-up EXAM: MRI HEAD WITHOUT CONTRAST TECHNIQUE: Multiplanar, multiecho pulse sequences of the brain and surrounding structures were obtained without intravenous contrast. COMPARISON:  No prior MRI available, correlation is made with CT head 01/27/2023 FINDINGS: Brain: Scattered foci of restricted diffusion with ADC correlate throughout the right cerebral hemisphere, with more focal and contiguous areas of restricted diffusion in the right frontal and parietal cortex (series 5, image 89), watershed territory (series 5, image 85), right caudate and lentiform nucleus (series 5, image 78), and right occipital lobe (series 5, image 68-70). No acute hemorrhage, mass, mass effect, or midline shift. No hydrocephalus or extra-axial collection. No hemosiderin deposition to suggest remote hemorrhage. Scattered and confluent T2 hyperintense signal in the periventricular white matter and pons, likely the sequela of moderate to severe chronic small vessel ischemic disease. Normal cerebral volume for age. Vascular: Normal arterial flow voids. Skull and upper cervical spine: Normal marrow signal. Sinuses/Orbits: Mucosal thickening in the left ethmoid air cells. No acute finding in the orbits. Other: The mastoids are well aerated. IMPRESSION: Scattered acute infarcts throughout the right  cerebral hemisphere, with more focal areas of infarction in the right frontal and parietal cortex, watershed territory, right caudate and lentiform nucleus, and right occipital lobe. These results will be called to the ordering clinician or representative by the Radiologist Assistant, and communication documented in the PACS or [employer]. Electronically Signed   By: Wiliam Ke M.D.   On: 01/27/2023 23:45   CT HEAD CODE STROKE WO CONTRAST  Result Date: 01/27/2023 CLINICAL DATA:  Code stroke.  Left-sided weakness, right-sided gaze. EXAM: CT ANGIOGRAPHY HEAD AND NECK TECHNIQUE: Multidetector CT imaging of the head and neck was performed using the standard protocol during bolus administration of intravenous contrast. Multiplanar CT image reconstructions and MIPs were obtained to evaluate the vascular anatomy. Carotid stenosis measurements (when applicable) are obtained utilizing NASCET criteria, using the distal internal carotid diameter as the denominator. RADIATION DOSE REDUCTION: This exam was performed according to the departmental dose-optimization program which includes automated exposure control, adjustment of the mA and/or kV according to patient size and/or use of iterative reconstruction technique. CONTRAST:  75 cc Omnipaque 350 COMPARISON:  CT head 09/29/2021 FINDINGS: CT HEAD FINDINGS Brain: There is no evidence of acute intracranial hemorrhage, extra-axial fluid collection, or acute territorial infarct. Parenchymal volume is normal. The ventricles are normal in size. Gray-white differentiation is preserved. There is patchy hypodensity in the supratentorial white matter likely reflecting sequela of advanced chronic small-vessel ischemic change. Pituitary and suprasellar region are normal. There is no mass lesion there is no mass effect or midline shift. Vascular: No hyperdense vessel is seen. Skull: Normal. Negative for fracture or focal lesion. Sinuses/Orbits: The paranasal sinuses are clear.  The globes and orbits are unremarkable. Other: None. ASPECTS [facility] Stroke Program Early  CT Score) - Ganglionic level infarction (caudate, lentiform nuclei, internal capsule, insula, M1-M3 cortex): 7 - Supraganglionic infarction (M4-M6 cortex): 3 Total score (0-10 with 10 being normal): 10 CTA NECK FINDINGS Aortic arch: There is mild calcified plaque in the imaged aortic arch. The origins of the major branch vessels are patent. The subclavian arteries are patent to the level imaged. Right carotid system: The right common carotid artery is patent. There is mixed plaque of the bifurcation with noncalcified plaque projecting into the lumen resulting in hemodynamically significant stenosis just after the bifurcation, estimated at 60%. Distally, there is additional mural adherent clot in the lumen of the ICA resulting in high-grade stenosis, estimated at 80%. There is no raised dissection flap. The distal internal carotid artery is patent. The external carotid artery is patent. There is no evidence of aneurysm/pseudoaneurysm. Left carotid system: The left common carotid artery is patent. There is calcified plaque at the bifurcation without hemodynamically significant stenosis or occlusion. The internal and external carotid arteries are patent. There is no evidence of dissection or aneurysm/pseudoaneurysm. Vertebral arteries: The vertebral arteries are patent, without hemodynamically significant stenosis or occlusion. There is no evidence of dissection or aneurysm/pseudoaneurysm. Skeleton: There is no acute osseous abnormality or suspicious osseous lesion. There is no visible canal hematoma. Other neck: There is extensive dental disease. The soft tissues of the neck are unremarkable. Upper chest: There is emphysema in the lung apices. Review of the MIP images confirms the above findings CTA HEAD FINDINGS Anterior circulation: There is calcified plaque in the intracranial ICAs without significant stenosis or occlusion.  The right M1 segment is patent. The branch vessels are patent, without proximal stenosis or occlusion. The left MCA is patent, without proximal stenosis or occlusion. The bilateral ACAs are patent, without proximal stenosis or occlusion. There is no aneurysm or AVM. Posterior circulation: The bilateral V4 segments are patent. The basilar artery is patent. The major cerebellar arteries appear patent. The bilateral PCAs are patent, without proximal stenosis or occlusion. There is a fetal origin of the right PCA. A left posterior communicating artery is also identified. There is no aneurysm or AVM. Venous sinuses: Patent. Anatomic variants: As above. Review of the MIP images confirms the above findings IMPRESSION: 1. No acute intracranial pathology.  No evidence of evolved infarct. 2. Mural adherent clot in the right internal carotid artery after the bifurcation resulting in high-grade stenosis, estimated at 80%. 3. More proximally in the right internal carotid artery just after the bifurcation, mixed plaque results in proximally 60% stenosis. 4. Mild plaque at the left carotid bifurcation without significant stenosis. Patent vertebral arteries in the neck. 5. Patent intracranial vasculature. 6. Emphysema. Findings of the noncontrast head CT were communicated to Dr Roda Shutters at 11:32 am. The the CTA results were discussed at 11:43 am. Electronically Signed   By: Lesia Hausen M.D.   On: 01/27/2023 11:57   CT ANGIO HEAD NECK W WO CM (CODE STROKE)  Result Date: 01/27/2023 CLINICAL DATA:  Code stroke.  Left-sided weakness, right-sided gaze. EXAM: CT ANGIOGRAPHY HEAD AND NECK TECHNIQUE: Multidetector CT imaging of the head and neck was performed using the standard protocol during bolus administration of intravenous contrast. Multiplanar CT image reconstructions and MIPs were obtained to evaluate the vascular anatomy. Carotid stenosis measurements (when applicable) are obtained utilizing NASCET criteria, using the distal  internal carotid diameter as the denominator. RADIATION DOSE REDUCTION: This exam was performed according to the departmental dose-optimization program which includes automated exposure control, adjustment of the  mA and/or kV according to patient size and/or use of iterative reconstruction technique. CONTRAST:  75 cc Omnipaque 350 COMPARISON:  CT head 09/29/2021 FINDINGS: CT HEAD FINDINGS Brain: There is no evidence of acute intracranial hemorrhage, extra-axial fluid collection, or acute territorial infarct. Parenchymal volume is normal. The ventricles are normal in size. Gray-white differentiation is preserved. There is patchy hypodensity in the supratentorial white matter likely reflecting sequela of advanced chronic small-vessel ischemic change. Pituitary and suprasellar region are normal. There is no mass lesion there is no mass effect or midline shift. Vascular: No hyperdense vessel is seen. Skull: Normal. Negative for fracture or focal lesion. Sinuses/Orbits: The paranasal sinuses are clear. The globes and orbits are unremarkable. Other: None. ASPECTS [facility] Stroke Program Early CT Score) - Ganglionic level infarction (caudate, lentiform nuclei, internal capsule, insula, M1-M3 cortex): 7 - Supraganglionic infarction (M4-M6 cortex): 3 Total score (0-10 with 10 being normal): 10 CTA NECK FINDINGS Aortic arch: There is mild calcified plaque in the imaged aortic arch. The origins of the major branch vessels are patent. The subclavian arteries are patent to the level imaged. Right carotid system: The right common carotid artery is patent. There is mixed plaque of the bifurcation with noncalcified plaque projecting into the lumen resulting in hemodynamically significant stenosis just after the bifurcation, estimated at 60%. Distally, there is additional mural adherent clot in the lumen of the ICA resulting in high-grade stenosis, estimated at 80%. There is no raised dissection flap. The distal internal carotid  artery is patent. The external carotid artery is patent. There is no evidence of aneurysm/pseudoaneurysm. Left carotid system: The left common carotid artery is patent. There is calcified plaque at the bifurcation without hemodynamically significant stenosis or occlusion. The internal and external carotid arteries are patent. There is no evidence of dissection or aneurysm/pseudoaneurysm. Vertebral arteries: The vertebral arteries are patent, without hemodynamically significant stenosis or occlusion. There is no evidence of dissection or aneurysm/pseudoaneurysm. Skeleton: There is no acute osseous abnormality or suspicious osseous lesion. There is no visible canal hematoma. Other neck: There is extensive dental disease. The soft tissues of the neck are unremarkable. Upper chest: There is emphysema in the lung apices. Review of the MIP images confirms the above findings CTA HEAD FINDINGS Anterior circulation: There is calcified plaque in the intracranial ICAs without significant stenosis or occlusion. The right M1 segment is patent. The branch vessels are patent, without proximal stenosis or occlusion. The left MCA is patent, without proximal stenosis or occlusion. The bilateral ACAs are patent, without proximal stenosis or occlusion. There is no aneurysm or AVM. Posterior circulation: The bilateral V4 segments are patent. The basilar artery is patent. The major cerebellar arteries appear patent. The bilateral PCAs are patent, without proximal stenosis or occlusion. There is a fetal origin of the right PCA. A left posterior communicating artery is also identified. There is no aneurysm or AVM. Venous sinuses: Patent. Anatomic variants: As above. Review of the MIP images confirms the above findings IMPRESSION: 1. No acute intracranial pathology.  No evidence of evolved infarct. 2. Mural adherent clot in the right internal carotid artery after the bifurcation resulting in high-grade stenosis, estimated at 80%. 3. More  proximally in the right internal carotid artery just after the bifurcation, mixed plaque results in proximally 60% stenosis. 4. Mild plaque at the left carotid bifurcation without significant stenosis. Patent vertebral arteries in the neck. 5. Patent intracranial vasculature. 6. Emphysema. Findings of the noncontrast head CT were communicated to Dr Roda Shutters at 11:32 am.  The the CTA results were discussed at 11:43 am. Electronically Signed   By: Lesia Hausen M.D.   On: 01/27/2023 11:57       HISTORY OF PRESENT ILLNESS 72 year old patient with history of PAD, CAD, smoking, hypertension, hyperlipidemia on Coumadin for PAD presented with left-sided weakness, right gaze deviation and left-sided neglect.  HOSPITAL COURSE Patient was taken to interventional radiology for mechanical thrombectomy of right ICA, which was successful with TICI 3 revascularization.  Patient was noted to have been started on Coumadin many years ago by vascular surgery for peripheral arterial disease with femoral-popliteal stenting.  Those stents were occluded, and patient had not been seen by vascular surgery for quite some time.  After discussion with vascular surgery here, Coumadin was switched to DAPT with aspirin and Plavix.  Patient is now ready for discharge to inpatient rehabilitation.  Stroke:  right MCA and MCA/ACA watershed zone given a mixed etiology of scattered LAA embolic infarct phenomena plus cortical watershed hypoperfusion from the intramural right ICA thrombus, likely from underlying large vessel disease even on coumadin with therapeutic INR s/p successful mechanical thrombectomy with underlying 55% right ICA stenosis CT no acute finding CTA showed right carotid mural thrombus with high grade stenosis 80%. More proximally in the right internal carotid artery just after the bifurcation, mixed plaque results in proximally 60% stenosis. MRI  Scattered acute infarcts throughout the right cerebral hemisphere, with more focal  areas of infarction in the right frontal and parietal cortex, watershed territory, right caudate and lentiform nucleus, and right occipital lobe. Carotid Doppler 1 to 39% bilaterally 2D Echo EF 55 to 60% LDL 53 HgbA1c 6.2 Coumadin for VTE prophylaxis warfarin daily prior to admission, now on aspirin 81 mg daily and Plavix 75 mg daily yes   Patient counseled to be compliant with her antithrombotic medications Ongoing aggressive stroke risk factor management Therapy recommendations: CIR Disposition:  pending   Anticoagulation with Coumadin for PAD On Coumadin at home PTA for peripheral arterial disease INR 2.4->2.2->2.1-> 2.7-> 2.9 Given developed carotid intramural thrombus in the setting of therapeutic INR, will discontinue warfarin and switch to dual antiplatelet therapy of aspirin and Plavix After discussion with vascular surgery here, will discontinue Coumadin and start DAPT with aspirin and Plavix   Hypertension Stable Off Cleviprex BP goal less than 180/105 Long term BP goal normotensive   Hyperlipidemia Home meds: Lipitor 10 LDL 53, goal < 70 Now on Lipitor 10 No high intensity statin given LDL within goal Continue statin at discharge   Tobacco abuse Current smoker Smoking cessation counseling provided Nicotine patch provided Pt is willing to quit   Other Stroke Risk Factors Advanced age PAD CAD  DISCHARGE EXAM Blood pressure 134/83, pulse 76, temperature 98.4 F (36.9 C), temperature source Oral, resp. rate 18, height 5\' 4"  (1.626 m), weight 51.1 kg, SpO2 99 %. General: Alert, well-nourished, well-developed elderly patient in no acute distress Respiratory: Regular, unlabored respirations on room air  NEURO:  Mental Status: AA&Ox3  Speech/Language: speech is without dysarthria or aphasia.    Cranial Nerves:  II: PERRL.  III, IV, VI: EOMI. Eyelids elevate symmetrically.  VII: Left facial droop VIII: hearing intact to voice. IX, X: Phonation is normal.   XII: tongue is midline without fasciculations. Motor: 5/5 strength to all muscle groups tested with diminished fine motor movements of left hand and right hand orbiting left Tone: is normal and bulk is normal Sensation- Intact to light touch bilaterally.  Coordination: FTN intact bilaterally Gait- deferred  Discharge Diet      Diet   Diet regular Room service appropriate? No; Fluid consistency: Thin   liquids  DISCHARGE PLAN Disposition:  Transfer to [facility] for ongoing PT, OT and ST aspirin 81 mg daily and clopidogrel 75 mg daily for secondary stroke prevention. Recommend ongoing stroke risk factor control by Primary Care Physician at time of discharge from inpatient rehabilitation. Follow-up PCP Danelle Berry, PA-C in 2 weeks following discharge from rehab. Follow-up in [facility] in 8 weeks following discharge from rehab, office to schedule an appointment.   32 minutes were spent preparing discharge.  Cortney E Ernestina Columbia , MSN, AGACNP-BC [facility] See Amion for schedule and pager information 02/02/2023 11:51 AM   I have personally obtained history,examined this patient, reviewed notes, independently viewed imaging studies, participated in medical decision making and plan of care.ROS completed by me personally and pertinent positives fully documented  I have made any additions or clarifications directly to the above note. Agree with note above.    Delia Heady, MD Medical Director [facility] Pager: [phone] 02/02/2023 3:27 PM

## 2023-02-02 NOTE — Progress Notes (Signed)
Inpatient Rehabilitation Admission Medication Review by a Pharmacist  A complete drug regimen review was completed for this patient to identify any potential clinically significant medication issues.  High Risk Drug Classes Is patient taking? Indication by Medication  Antipsychotic No   Anticoagulant No   Antibiotic No   Opioid No   Antiplatelet Yes Aspirin,  clopidogrel - CVA  Hypoglycemics/insulin No   Vasoactive Medication No   Chemotherapy No   Other Yes Atorvastatin - HLD     Type of Medication Issue Identified Description of Issue Recommendation(s)  Drug Interaction(s) (clinically significant)     Duplicate Therapy     Allergy     No Medication Administration End Date     Incorrect Dose     Additional Drug Therapy Needed     Significant med changes from prior encounter (inform family/care partners about these prior to discharge). Warfarin stopped Amlodipine 5 mg po daily Resume amlodipine if and when appropriate during rehab or post rehab stay.  Other       Clinically significant medication issues were identified that warrant physician communication and completion of prescribed/recommended actions by midnight of the next day:  No  Pharmacist comments: Warfarin stopped  Time spent performing this drug regimen review (minutes):  20 minutes  Thank you Okey Regal, PharmD

## 2023-02-02 NOTE — Progress Notes (Signed)
Ranelle Oyster, MD Physician Physical Medicine and Rehabilitation   Consult Note     Signed   Date of Service: 01/29/2023  9:11 AM  Related encounter: ED to Hosp-Admission (Discharged) from 01/27/2023 in [facility]   Signed     Expand All Collapse All  Show:Clear all [x] Written[x] Templated[] Copied  Added by: [x] Ranelle Oyster, MD  [] Hover for details          Physical Medicine and Rehabilitation Consult Reason for Consult:left-sided weakness, functional deficits   Referring Physician: Roda Shutters     HPI: Mary [name] is a 72 y.o. female with a history of PAD, CAD, on chronic coumadin who presented 5/29 with left hemiparesis and left inattention. CTH negative but CTA demonstrated right carotid mural thrombus with high grade stenosis. Pt not TNK candidate because of coumadin.(INR 2.4)  Right ICA thrombectomy performed by NIR. MRI performed demonstrated scattered infarcts throughout the right cerebral hemisphere with a focus in the right frontal and parietal cortex, watershed territory, right caudate and lentiform nucleus, and right occipital lobe. Pt was placed on full dose ASA and INR goal increased to 2.5-3.5. Smoking cessation recommended. Pt was up yesterday with therapy and was mod assist for sit-std transfers and walked 5' with 2 person hand held assist. Left visual-spatial awareness is a barrier. Pt following for dysphagia and cognition. Pt advanced to regular diet yesterday afternoon. Prior to admit pt was independent and driving. She lives in a 1 level home, 3 STE. Spouse is available to assist.       Review of Systems  Constitutional:  Negative for chills and fever.  HENT: Negative.    Eyes: Negative.   Respiratory: Negative.    Cardiovascular: Negative.   Gastrointestinal: Negative.   Genitourinary: Negative.   Skin:  Negative for rash.  Neurological:  Positive for focal weakness. Negative for headaches.  Psychiatric/Behavioral:  Negative  for suicidal ideas.         Past Medical History:  Diagnosis Date   Arthritis     Hyperlipidemia     Hypertension     Hyperuricemia     Osteoporosis     PAD (peripheral artery disease) (HCC) 02/2010    angiplasty, popliteal artery   Tobacco abuse           Past Surgical History:  Procedure Laterality Date   ANGIOPLASTY   July 2011    PAD- popliteal artery   COLONOSCOPY WITH PROPOFOL N/A 01/13/2018    Procedure: COLONOSCOPY WITH PROPOFOL;  Surgeon: Toney Reil, MD;  Location: [facility] ENDOSCOPY;  Service: Gastroenterology;  Laterality: N/A;   COLONOSCOPY WITH PROPOFOL N/A 01/14/2018    Procedure: COLONOSCOPY WITH PROPOFOL;  Surgeon: Pasty Spillers, MD;  Location: [facility] ENDOSCOPY;  Service: Endoscopy;  Laterality: N/A;   COLONOSCOPY WITH PROPOFOL N/A 07/06/2018    Procedure: COLONOSCOPY WITH PROPOFOL;  Surgeon: Pasty Spillers, MD;  Location: [facility] ENDOSCOPY;  Service: Endoscopy;  Laterality: N/A;   CORONARY ANGIOPLASTY       IR CT HEAD LTD   01/27/2023   IR PERCUTANEOUS ART THROMBECTOMY/INFUSION INTRACRANIAL INC DIAG ANGIO   01/27/2023   JOINT REPLACEMENT       Leg Bypass Surgery Right 08/2010   OVARIAN CYST SURGERY   1980s   POPLITEAL ARTERY STENT Right 05/2010   POPLITEAL ARTERY STENT Right 07/2010   RADIOLOGY WITH ANESTHESIA N/A 01/27/2023    Procedure: IR WITH ANESTHESIA;  Surgeon: Radiologist, Medication, MD;  Location: [facility] OR;  Service:  Radiology;  Laterality: N/A;   TOTAL HIP ARTHROPLASTY Left 10/27/2017    Procedure: TOTAL HIP ARTHROPLASTY;  Surgeon: Deeann Saint, MD;  Location: [facility] ORS;  Service: Orthopedics;  Laterality: Left;         Family History  Problem Relation Age of Onset   Arthritis Mother     Diabetes Mother     Stroke Mother     Hypertension Mother     Dementia Mother     Cancer Father     Arthritis Sister     Seizures Sister     Diabetes Sister     Hypertension Sister     COPD Sister     Cancer Brother     Stroke Maternal Grandmother      Diabetes Paternal Grandmother     Cancer Sister     Diabetes Sister     Arthritis Sister     Diabetes Brother     Stroke Brother     Healthy Brother     Diabetes Brother     Stroke Brother     Cancer Brother     Heart disease Neg Hx     Breast cancer Neg Hx      Social History:  reports that she has been smoking cigarettes. She has a 20.00 pack-year smoking history. She has never used smokeless tobacco. She reports that she does not drink alcohol and does not use drugs. Allergies: No Known Allergies       Medications Prior to Admission  Medication Sig Dispense Refill   amLODipine (NORVASC) 5 MG tablet TAKE 1 TABLET EVERY DAY 90 tablet 1   atorvastatin (LIPITOR) 10 MG tablet TAKE 1 TABLET AT BEDTIME 90 tablet 0   cholecalciferol (VITAMIN D3) 25 MCG (1000 UNIT) tablet Take 2 tablets (2,000 Units total) by mouth daily.       vitamin E 400 UNIT capsule Take 180 Units by mouth daily.        warfarin (COUMADIN) 3 MG tablet Take 1 tablet (3 mg) by mouth one day a week, other 6 days take 5 mg tab poqd 15 tablet 0   warfarin (COUMADIN) 5 MG tablet Take 1 tab (5 mg) po daily, 6 days a week, and one day a week take 3 mg tab 60 tablet 0   acetaminophen (TYLENOL) 500 MG tablet Take 500 mg by mouth 2 (two) times daily as needed for moderate pain or headache.       nicotine (NICODERM CQ - DOSED IN MG/24 HR) 7 mg/24hr patch Place 1 patch (7 mg total) onto the skin daily. (Patient not taking: Reported on 01/27/2023) 28 patch 2      Home: Home Living Family/patient expects to be discharged to:: Private residence Living Arrangements: Spouse/significant other Available Help at Discharge: Family Type of Home: House Home Access: Stairs to enter Secretary/administrator of Steps: 3 Entrance Stairs-Rails: Left, Right Home Layout: One level Bathroom Shower/Tub: Engineer, manufacturing systems: Standard Bathroom Accessibility: Yes Home Equipment: Agricultural consultant (2 wheels)  Lives With: Spouse   Functional History: Prior Function Prior Level of Function : Independent/Modified Independent, Driving Mobility Comments: No AD ADLs Comments: Indep in ADL and IADL Functional Status:  Mobility: Bed Mobility Overal bed mobility: Needs Assistance Bed Mobility: Supine to Sit, Sit to Supine Supine to sit: Min assist, +2 for physical assistance, HOB elevated Sit to supine: Min assist, HOB elevated General bed mobility comments: assist for trunk and LE management, pt using RUE to pull self up Transfers  Overall transfer level: Needs assistance Equipment used: 2 person hand held assist Transfers: Sit to/from Stand, Bed to chair/wheelchair/BSC Sit to Stand: Mod assist, +2 physical assistance Bed to/from chair/wheelchair/BSC transfer type:: Stand pivot Stand pivot transfers: Mod assist, +2 physical assistance General transfer comment: mod +2 for power up, rise, steady, and pivot to/from [facility]. LLE incoordination with stepping to/from [facility] Ambulation/Gait Ambulation/Gait assistance: Min assist, +2 physical assistance Gait Distance (Feet): 5 Feet Assistive device: 2 person hand held assist Gait Pattern/deviations: Step-through pattern, Decreased stride length, Trunk flexed General Gait Details: assist to steady, guide trajectory, inconsistent placement of LLE Gait velocity: decr   ADL: ADL Overall ADL's : Needs assistance/impaired Eating/Feeding: Set up, Sitting Grooming: Minimal assistance Upper Body Bathing: Minimal assistance Lower Body Bathing: Moderate assistance Upper Body Dressing : Minimal assistance Lower Body Dressing: Moderate assistance Toilet Transfer: Moderate assistance, +2 for physical assistance, +2 for safety/equipment, Stand-pivot, Ambulation Toilet Transfer Details (indicate cue type and reason): 2 person HHA; LLE incoordination stepping to [facility] Toileting- Clothing Manipulation and Hygiene: Sit to/from stand, Moderate assistance Toileting - Clothing Manipulation  Details (indicate cue type and reason): for STS; pt able to lean forward in standing and wipe self. Functional mobility during ADLs: Minimal assistance, Moderate assistance, +2 for physical assistance General ADL Comments: Limited by L inattention, decr coordiantion. Needs further testing of proprioception; suspect decr in LE and possubly UE   Cognition: Cognition Overall Cognitive Status: Impaired/Different from baseline Arousal/Alertness: Awake/alert Orientation Level: Oriented X4 Year: 2024 Month: May Day of Week: Correct Attention: Focused, Sustained Focused Attention: Appears intact Sustained Attention: Appears intact Memory: Impaired Memory Impairment: Decreased short term memory Awareness: Impaired Problem Solving: Impaired Problem Solving Impairment: Verbal basic Executive Function: Reasoning Reasoning: Appears intact Cognition Arousal/Alertness: Awake/alert Behavior During Therapy: WFL for tasks assessed/performed Overall Cognitive Status: Impaired/Different from baseline Area of Impairment: Following commands, Safety/judgement Following Commands: Follows one step commands with increased time Safety/Judgement: Decreased awareness of deficits, Decreased awareness of safety General Comments: L inattention, requires cues for attend to L. follows one-step commands with increased time and cuing   Blood pressure (!) 148/82, pulse 72, temperature 97.8 F (36.6 C), temperature source Oral, resp. rate 18, height 5\' 4"  (1.626 m), weight 51.1 kg, SpO2 97 %. Physical Exam Constitutional:      General: She is not in acute distress. HENT:     Head: Normocephalic.     Right Ear: External ear normal.     Left Ear: External ear normal.     Nose: Nose normal.  Eyes:     Conjunctiva/sclera: Conjunctivae normal.     Pupils: Pupils are equal, round, and reactive to light.  Cardiovascular:     Rate and Rhythm: Normal rate and regular rhythm.  Pulmonary:     Effort: Pulmonary effort  is normal.  Abdominal:     General: There is no distension.  Musculoskeletal:        General: No swelling or tenderness.     Cervical back: Normal range of motion.  Skin:    General: Skin is warm.  Neurological:     Mental Status: She is alert.     Comments: Pt is alert. Right gaze preference. Can track to left with verbal cueing. Mild left central 7. Speech soft but fairly clear. RUE and RLE 5/5. LUE 3/5 prox to distal and LLE 3+/5 prox to distal. Sensed LT/pain equally.  No limb ataxia or cerebellar signs. No abnormal tone appreciated. Toes down  Psychiatric:     Comments:  Pt generally flat but cooperative and generally pleasant.         Lab Results Last 24 Hours       Results for orders placed or performed during the hospital encounter of 01/27/23 (from the past 24 hour(s))  Glucose, capillary     Status: Abnormal    Collection Time: 01/28/23 12:18 PM  Result Value Ref Range    Glucose-Capillary 105 (H) 70 - 99 mg/dL  Protime-INR     Status: Abnormal    Collection Time: 01/29/23  4:18 AM  Result Value Ref Range    Prothrombin Time 23.9 (H) 11.4 - 15.2 seconds    INR 2.1 (H) 0.8 - 1.2       Imaging Results (Last 48 hours)  IR PERCUTANEOUS ART THROMBECTOMY/INFUSION INTRACRANIAL INC DIAG ANGIO   Result Date: 01/29/2023 INDICATION: New onset right gaze deviation and left-sided neglect, and left hemiparesis. Near occlusive prominent thrombus in the right internal carotid artery. EXAM: 1. EMERGENT LARGE VESSEL OCCLUSION THROMBOLYSIS (anterior CIRCULATION) COMPARISON:  CT angiogram of the head and neck of Jan 27, 2023. MEDICATIONS: Ancef 2 g IV antibiotic was administered within 1 hour of the procedure. ANESTHESIA/SEDATION: General anesthesia. CONTRAST:  Omnipaque 300 approximately 80 mL. FLUOROSCOPY TIME:  Fluoroscopy Time: 17 minutes 24 seconds (947 mGy). COMPLICATIONS: None immediate. TECHNIQUE: Following a full explanation of the procedure along with the potential associated  complications, an informed witnessed consent was obtained. The risks of intracranial hemorrhage of 10%, worsening neurological deficit, ventilator dependency, death and inability to revascularize were all reviewed in detail with the patient. The patient was then put under general anesthesia by the Department of Anesthesiology at [facility]. The right groin was prepped and draped in the usual sterile fashion. Thereafter using modified Seldinger technique, transfemoral access into the right common femoral artery was obtained without difficulty. Over an 0.035 inch guidewire an 8 French 25 cm Arrow sheath was inserted. Through this, and also over an 0.035 inch guidewire a combination of a Berenstein 125 cm 5.5 [place] support catheter inside of an 087 95 cm balloon guide catheter was advanced to the right common carotid artery. The guidewire, and the support catheter were removed. Arteriograms were then performed through the balloon guide catheter extra cranially and intracranially. FINDINGS: The right common carotid bifurcation demonstrates the right external carotid artery and its major branches to be widely patent. The right internal carotid artery at the bulb has a circumferential atherosclerotic plaque associated with an approximately 3 cm irregular filling defect nearly occluding the internal carotid artery. More distally, patency is maintained of the distal cervical right ICA, the petrous, the cavernous and the supraclinoid right ICA. A right posterior communicating artery is seen opacifying the right posterior cerebral artery distribution. The right middle cerebral artery opacifies into the capillary and venous phases. A hypoplastic right A1 segment is noted with continuation into the A2 region. PROCEDURE: Through the balloon guide catheter, an 021 150 cm microcatheter was advanced over an 014 inch soft tip micro guidewire with a moderate J configuration to just proximal to the right internal carotid  artery origin. At this time, proximal flow arrest was initiated by inflating the balloon guide catheter in the distal right common carotid artery. Also a combination of the micro guidewire and microcatheter was advanced through the internal carotid artery, slight proximal aspiration was applied with a 20 mL syringe as the combination was navigated to the petrous segment of the right ICA. The aspiration was then stopped. The micro  guidewire was removed. A 6.5 mm x 47 mm Embotrap retrieved retrieval device was then advanced to the distal end of the microcatheter and deployed such that the proximal portion of the device was in the distal cervical right ICA. The microcatheter was removed. With the proximal flow arrest intact, proximal aspiration was then performed at the hub of the balloon guide catheter with a Penumbra aspiration device, as the retriever was removed. An 071 Zoom aspiration catheter was then advanced over an 014 microcatheter as aspiration was performed. The 071 Zoom aspiration catheter was advanced into the right internal carotid artery proximally and distally and then distal to proximal flow arrest still intact. Thick irregular grayish white clot fragments were retained in the retriever, and in the canister. Following reversal of flow arrest in the right common carotid artery, a control arteriogram performed through the balloon guide catheter revealed no residual filling defects in the right internal carotid artery extra cranially and intracranially. Right MCA remained widely patent without evidence of intraluminal filling defects, or of occlusions. The non dominant right A1 and A2 segments also demonstrated no changes. The balloon guide wire was removed. Manual compression was applied with quick clot for 25 minutes to obtain hemostasis at the right groin puncture site. Distal pulses remained Dopplerable in both feet unchanged at the end of procedure. A flat panel CT of the brain demonstrated no  evidence of intracranial hemorrhage. The patient's general anesthesia was then reversed and the patient was extubated. Upon recovery, the patient was able to obey simple commands though had no movement in her left arm and leg. She was then transferred to the neuro ICU for post revascularization care. IMPRESSION: Status post right ICA extra cranially with 1 pass with a 6.5 mm x 47 mm Emboshield retrieval device and aspiration achieving complete revascularization. Approximately 65% stenosis of the right internal carotid artery at the bulb. PLAN: We will probably need placement of a stent at the right internal carotid artery proximally to prevent future ischemic strokes in the right MCA distribution. Electronically Signed   By: Julieanne Cotton M.D.   On: 01/29/2023 08:42    IR CT Head Ltd   Result Date: 01/29/2023 INDICATION: New onset right gaze deviation and left-sided neglect, and left hemiparesis. Near occlusive prominent thrombus in the right internal carotid artery. EXAM: 1. EMERGENT LARGE VESSEL OCCLUSION THROMBOLYSIS (anterior CIRCULATION) COMPARISON:  CT angiogram of the head and neck of Jan 27, 2023. MEDICATIONS: Ancef 2 g IV antibiotic was administered within 1 hour of the procedure. ANESTHESIA/SEDATION: General anesthesia. CONTRAST:  Omnipaque 300 approximately 80 mL. FLUOROSCOPY TIME:  Fluoroscopy Time: 17 minutes 24 seconds (947 mGy). COMPLICATIONS: None immediate. TECHNIQUE: Following a full explanation of the procedure along with the potential associated complications, an informed witnessed consent was obtained. The risks of intracranial hemorrhage of 10%, worsening neurological deficit, ventilator dependency, death and inability to revascularize were all reviewed in detail with the patient. The patient was then put under general anesthesia by the Department of Anesthesiology at [facility]. The right groin was prepped and draped in the usual sterile fashion. Thereafter using modified  Seldinger technique, transfemoral access into the right common femoral artery was obtained without difficulty. Over an 0.035 inch guidewire an 8 French 25 cm Arrow sheath was inserted. Through this, and also over an 0.035 inch guidewire a combination of a Berenstein 125 cm 5.5 [place] support catheter inside of an 087 95 cm balloon guide catheter was advanced to the right common carotid artery.  The guidewire, and the support catheter were removed. Arteriograms were then performed through the balloon guide catheter extra cranially and intracranially. FINDINGS: The right common carotid bifurcation demonstrates the right external carotid artery and its major branches to be widely patent. The right internal carotid artery at the bulb has a circumferential atherosclerotic plaque associated with an approximately 3 cm irregular filling defect nearly occluding the internal carotid artery. More distally, patency is maintained of the distal cervical right ICA, the petrous, the cavernous and the supraclinoid right ICA. A right posterior communicating artery is seen opacifying the right posterior cerebral artery distribution. The right middle cerebral artery opacifies into the capillary and venous phases. A hypoplastic right A1 segment is noted with continuation into the A2 region. PROCEDURE: Through the balloon guide catheter, an 021 150 cm microcatheter was advanced over an 014 inch soft tip micro guidewire with a moderate J configuration to just proximal to the right internal carotid artery origin. At this time, proximal flow arrest was initiated by inflating the balloon guide catheter in the distal right common carotid artery. Also a combination of the micro guidewire and microcatheter was advanced through the internal carotid artery, slight proximal aspiration was applied with a 20 mL syringe as the combination was navigated to the petrous segment of the right ICA. The aspiration was then stopped. The micro guidewire was  removed. A 6.5 mm x 47 mm Embotrap retrieved retrieval device was then advanced to the distal end of the microcatheter and deployed such that the proximal portion of the device was in the distal cervical right ICA. The microcatheter was removed. With the proximal flow arrest intact, proximal aspiration was then performed at the hub of the balloon guide catheter with a Penumbra aspiration device, as the retriever was removed. An 071 Zoom aspiration catheter was then advanced over an 014 microcatheter as aspiration was performed. The 071 Zoom aspiration catheter was advanced into the right internal carotid artery proximally and distally and then distal to proximal flow arrest still intact. Thick irregular grayish white clot fragments were retained in the retriever, and in the canister. Following reversal of flow arrest in the right common carotid artery, a control arteriogram performed through the balloon guide catheter revealed no residual filling defects in the right internal carotid artery extra cranially and intracranially. Right MCA remained widely patent without evidence of intraluminal filling defects, or of occlusions. The non dominant right A1 and A2 segments also demonstrated no changes. The balloon guide wire was removed. Manual compression was applied with quick clot for 25 minutes to obtain hemostasis at the right groin puncture site. Distal pulses remained Dopplerable in both feet unchanged at the end of procedure. A flat panel CT of the brain demonstrated no evidence of intracranial hemorrhage. The patient's general anesthesia was then reversed and the patient was extubated. Upon recovery, the patient was able to obey simple commands though had no movement in her left arm and leg. She was then transferred to the neuro ICU for post revascularization care. IMPRESSION: Status post right ICA extra cranially with 1 pass with a 6.5 mm x 47 mm Emboshield retrieval device and aspiration achieving complete  revascularization. Approximately 65% stenosis of the right internal carotid artery at the bulb. PLAN: We will probably need placement of a stent at the right internal carotid artery proximally to prevent future ischemic strokes in the right MCA distribution. Electronically Signed   By: Julieanne Cotton M.D.   On: 01/29/2023 08:42    ECHOCARDIOGRAM COMPLETE  Result Date: 01/28/2023    ECHOCARDIOGRAM REPORT   Patient Name:   Mary [name] Date of Exam: 01/28/2023 Medical Rec #:  [id number]     Height:       64.0 in Accession #:    [id number]    Weight:       112.7 lb Date of Birth:  07-Dec-1950      BSA:          1.533 m Patient Age:    29 years      BP:           124/71 mmHg Patient Gender: F             HR:           68 bpm. Exam Location:  Inpatient Procedure: 2D Echo, Color Doppler and Cardiac Doppler Indications:    Stroke  History:        Patient has prior history of Echocardiogram examinations and                 Patient has no prior history of Echocardiogram examinations.                 Right ICA occlusion, PAD, Smoker, HTN, HLD, CAD.  Sonographer:    Milbert Coulter Referring Phys: [id number] JINDONG XU IMPRESSIONS  1. Left ventricular ejection fraction, by estimation, is 55 to 60%. The left ventricle has normal function. The left ventricle has no regional wall motion abnormalities. Left ventricular diastolic parameters are consistent with Grade I diastolic dysfunction (impaired relaxation).  2. Right ventricular systolic function is normal. The right ventricular size is normal. Tricuspid regurgitation signal is inadequate for assessing PA pressure.  3. The mitral valve is grossly normal. Trivial mitral valve regurgitation.  4. The aortic valve is tricuspid. There is mild calcification of the aortic valve. There is mild thickening of the aortic valve. Aortic valve regurgitation is not visualized. Aortic valve sclerosis/calcification is present, without any evidence of aortic stenosis.  5. The inferior vena cava  is normal in size with greater than 50% respiratory variability, suggesting right atrial pressure of 3 mmHg. Comparison(s): No prior Echocardiogram. Conclusion(s)/Recommendation(s): No intracardiac source of embolism detected on this transthoracic study. Consider a transesophageal echocardiogram to exclude cardiac source of embolism if clinically indicated. FINDINGS  Left Ventricle: Left ventricular ejection fraction, by estimation, is 55 to 60%. The left ventricle has normal function. The left ventricle has no regional wall motion abnormalities. The left ventricular internal cavity size was normal in size. There is  no left ventricular hypertrophy. Left ventricular diastolic parameters are consistent with Grade I diastolic dysfunction (impaired relaxation). Right Ventricle: The right ventricular size is normal. No increase in right ventricular wall thickness. Right ventricular systolic function is normal. Tricuspid regurgitation signal is inadequate for assessing PA pressure. Left Atrium: Left atrial size was normal in size. Right Atrium: Right atrial size was normal in size. Pericardium: There is no evidence of pericardial effusion. Mitral Valve: The mitral valve is grossly normal. Trivial mitral valve regurgitation. Tricuspid Valve: The tricuspid valve is normal in structure. Tricuspid valve regurgitation is trivial. Aortic Valve: The aortic valve is tricuspid. There is mild calcification of the aortic valve. There is mild thickening of the aortic valve. Aortic valve regurgitation is not visualized. Aortic valve sclerosis/calcification is present, without any evidence of aortic stenosis. Aortic valve mean gradient measures 4.0 mmHg. Aortic valve peak gradient measures 7.3 mmHg. Aortic valve area, by VTI measures 1.23 cm. Pulmonic Valve: The  pulmonic valve was not well visualized. Pulmonic valve regurgitation is trivial. Aorta: The aortic root is normal in size and structure. Venous: The inferior vena cava is  normal in size with greater than 50% respiratory variability, suggesting right atrial pressure of 3 mmHg. IAS/Shunts: The atrial septum is grossly normal.  LEFT VENTRICLE PLAX 2D LVIDd:         3.60 cm     Diastology LVIDs:         2.50 cm     LV e' medial:    9.68 cm/s LV PW:         1.10 cm     LV E/e' medial:  8.9 LV IVS:        1.00 cm     LV e' lateral:   12.70 cm/s LVOT diam:     1.70 cm     LV E/e' lateral: 6.7 LV SV:         41 LV SV Index:   27 LVOT Area:     2.27 cm  LV Volumes (MOD) LV vol d, MOD A2C: 38.2 ml LV vol d, MOD A4C: 42.6 ml LV vol s, MOD A2C: 10.4 ml LV vol s, MOD A4C: 16.1 ml LV SV MOD A2C:     27.8 ml LV SV MOD A4C:     42.6 ml LV SV MOD BP:      28.3 ml RIGHT VENTRICLE RV Basal diam:  2.70 cm RV Mid diam:    2.30 cm RV S prime:     11.70 cm/s TAPSE (M-mode): 2.3 cm LEFT ATRIUM             Index        RIGHT ATRIUM           Index LA diam:        2.50 cm 1.63 cm/m   RA Area:     12.10 cm LA Vol (A2C):   34.1 ml 22.25 ml/m  RA Volume:   27.70 ml  18.07 ml/m LA Vol (A4C):   27.2 ml 17.74 ml/m LA Biplane Vol: 31.1 ml 20.29 ml/m  AORTIC VALVE AV Area (Vmax):    1.59 cm AV Area (Vmean):   1.42 cm AV Area (VTI):     1.23 cm AV Vmax:           135.00 cm/s AV Vmean:          97.300 cm/s AV VTI:            0.331 m AV Peak Grad:      7.3 mmHg AV Mean Grad:      4.0 mmHg LVOT Vmax:         94.60 cm/s LVOT Vmean:        60.700 cm/s LVOT VTI:          0.179 m LVOT/AV VTI ratio: 0.54  AORTA Ao Root diam: 3.10 cm Ao Asc diam:  2.50 cm MITRAL VALVE MV Area (PHT): 4.06 cm    SHUNTS MV Decel Time: 187 msec    Systemic VTI:  0.18 m MV E velocity: 85.70 cm/s  Systemic Diam: 1.70 cm MV A velocity: 85.70 cm/s MV E/A ratio:  1.00 Laurance Flatten MD Electronically signed by Laurance Flatten MD Signature Date/Time: 01/28/2023/3:48:06 PM    Final     VAS US CAROTID   Result Date: 01/28/2023 Carotid Arterial Duplex Study Patient Name:  Mary [name]  Date of Exam:   01/28/2023 Medical Rec #: [id number]  Accession #:    [id number] Date of Birth: October 26, 1950       Patient Gender: F Patient Age:   18 years Exam Location:  [facility] Procedure:      VAS US CAROTID Referring Phys: Scheryl Marten XU --------------------------------------------------------------------------------  Indications:       CVA, Weakness and right carotid mural thrombus, status post                    thrombectomy with residual 55-60% ICA stenosis. Risk Factors:      Hypertension, hyperlipidemia, current smoker, coronary artery                    disease, PAD. Other Factors:     On Coumadin for clotting disorder. Comparison Study:  No prior study Performing Technologist: Sherren Kerns RVS  Examination Guidelines: A complete evaluation includes B-mode imaging, spectral Doppler, color Doppler, and power Doppler as needed of all accessible portions of each vessel. Bilateral testing is considered an integral part of a complete examination. Limited examinations for reoccurring indications may be performed as noted.  Right Carotid Findings: +----------+--------+--------+--------+------------------+------------------+           PSV cm/sEDV cm/sStenosisPlaque DescriptionComments           +----------+--------+--------+--------+------------------+------------------+ CCA Prox  89      17                                intimal thickening +----------+--------+--------+--------+------------------+------------------+ CCA Mid                           focal and calcific                   +----------+--------+--------+--------+------------------+------------------+ CCA Distal82      17                                intimal thickening +----------+--------+--------+--------+------------------+------------------+ ICA Prox  77      20      1-39%   calcific          Shadowing          +----------+--------+--------+--------+------------------+------------------+ ICA Mid   108     20                                                    +----------+--------+--------+--------+------------------+------------------+ ICA Distal91      27                                                   +----------+--------+--------+--------+------------------+------------------+ ECA       117     11                                                   +----------+--------+--------+--------+------------------+------------------+ +----------+--------+-------+--------+-------------------+           PSV cm/sEDV cmsDescribeArm Pressure (mmHG) +----------+--------+-------+--------+-------------------+ Subclavian70                                         +----------+--------+-------+--------+-------------------+ +---------+--------+--+--------+--+  VertebralPSV cm/s70EDV cm/s14 +---------+--------+--+--------+--+  Left Carotid Findings: +----------+--------+--------+--------+------------------+------------------+           PSV cm/sEDV cm/sStenosisPlaque DescriptionComments           +----------+--------+--------+--------+------------------+------------------+ CCA Prox  85      21                                intimal thickening +----------+--------+--------+--------+------------------+------------------+ CCA Distal81      16                                intimal thickening +----------+--------+--------+--------+------------------+------------------+ ICA Prox  82      23      1-39%   calcific                             +----------+--------+--------+--------+------------------+------------------+ ICA Mid   77      21                                                   +----------+--------+--------+--------+------------------+------------------+ ICA Distal75      23                                                   +----------+--------+--------+--------+------------------+------------------+ ECA       77      12                                                    +----------+--------+--------+--------+------------------+------------------+ +----------+--------+--------+--------+-------------------+           PSV cm/sEDV cm/sDescribeArm Pressure (mmHG) +----------+--------+--------+--------+-------------------+ [id number]                                          +----------+--------+--------+--------+-------------------+ +---------+--------+--+--------+--+ VertebralPSV cm/s72EDV cm/s16 +---------+--------+--+--------+--+   Summary: Right Carotid: Velocities in the right ICA are consistent with a 1-39% stenosis. Left Carotid: Velocities in the left ICA are consistent with a 1-39% stenosis. Vertebrals:  Bilateral vertebral arteries demonstrate antegrade flow. Subclavians: Normal flow hemodynamics were seen in bilateral subclavian              arteries. *See table(s) above for measurements and observations.     Preliminary     MR BRAIN WO CONTRAST   Result Date: 01/27/2023 CLINICAL DATA:  Stroke, follow-up EXAM: MRI HEAD WITHOUT CONTRAST TECHNIQUE: Multiplanar, multiecho pulse sequences of the brain and surrounding structures were obtained without intravenous contrast. COMPARISON:  No prior MRI available, correlation is made with CT head 01/27/2023 FINDINGS: Brain: Scattered foci of restricted diffusion with ADC correlate throughout the right cerebral hemisphere, with more focal and contiguous areas of restricted diffusion in the right frontal and parietal cortex (series 5, image 89), watershed territory (series 5, image 85), right caudate and lentiform nucleus (series 5, image 78), and right occipital lobe (series 5,  image 68-70). No acute hemorrhage, mass, mass effect, or midline shift. No hydrocephalus or extra-axial collection. No hemosiderin deposition to suggest remote hemorrhage. Scattered and confluent T2 hyperintense signal in the periventricular white matter and pons, likely the sequela of moderate to severe chronic small vessel ischemic  disease. Normal cerebral volume for age. Vascular: Normal arterial flow voids. Skull and upper cervical spine: Normal marrow signal. Sinuses/Orbits: Mucosal thickening in the left ethmoid air cells. No acute finding in the orbits. Other: The mastoids are well aerated. IMPRESSION: Scattered acute infarcts throughout the right cerebral hemisphere, with more focal areas of infarction in the right frontal and parietal cortex, watershed territory, right caudate and lentiform nucleus, and right occipital lobe. These results will be called to the ordering clinician or representative by the Radiologist Assistant, and communication documented in the PACS or [employer]. Electronically Signed   By: Wiliam Ke M.D.   On: 01/27/2023 23:45    CT HEAD CODE STROKE WO CONTRAST   Result Date: 01/27/2023 CLINICAL DATA:  Code stroke.  Left-sided weakness, right-sided gaze. EXAM: CT ANGIOGRAPHY HEAD AND NECK TECHNIQUE: Multidetector CT imaging of the head and neck was performed using the standard protocol during bolus administration of intravenous contrast. Multiplanar CT image reconstructions and MIPs were obtained to evaluate the vascular anatomy. Carotid stenosis measurements (when applicable) are obtained utilizing NASCET criteria, using the distal internal carotid diameter as the denominator. RADIATION DOSE REDUCTION: This exam was performed according to the departmental dose-optimization program which includes automated exposure control, adjustment of the mA and/or kV according to patient size and/or use of iterative reconstruction technique. CONTRAST:  75 cc Omnipaque 350 COMPARISON:  CT head 09/29/2021 FINDINGS: CT HEAD FINDINGS Brain: There is no evidence of acute intracranial hemorrhage, extra-axial fluid collection, or acute territorial infarct. Parenchymal volume is normal. The ventricles are normal in size. Gray-white differentiation is preserved. There is patchy hypodensity in the supratentorial white matter  likely reflecting sequela of advanced chronic small-vessel ischemic change. Pituitary and suprasellar region are normal. There is no mass lesion there is no mass effect or midline shift. Vascular: No hyperdense vessel is seen. Skull: Normal. Negative for fracture or focal lesion. Sinuses/Orbits: The paranasal sinuses are clear. The globes and orbits are unremarkable. Other: None. ASPECTS [facility] Stroke Program Early CT Score) - Ganglionic level infarction (caudate, lentiform nuclei, internal capsule, insula, M1-M3 cortex): 7 - Supraganglionic infarction (M4-M6 cortex): 3 Total score (0-10 with 10 being normal): 10 CTA NECK FINDINGS Aortic arch: There is mild calcified plaque in the imaged aortic arch. The origins of the major branch vessels are patent. The subclavian arteries are patent to the level imaged. Right carotid system: The right common carotid artery is patent. There is mixed plaque of the bifurcation with noncalcified plaque projecting into the lumen resulting in hemodynamically significant stenosis just after the bifurcation, estimated at 60%. Distally, there is additional mural adherent clot in the lumen of the ICA resulting in high-grade stenosis, estimated at 80%. There is no raised dissection flap. The distal internal carotid artery is patent. The external carotid artery is patent. There is no evidence of aneurysm/pseudoaneurysm. Left carotid system: The left common carotid artery is patent. There is calcified plaque at the bifurcation without hemodynamically significant stenosis or occlusion. The internal and external carotid arteries are patent. There is no evidence of dissection or aneurysm/pseudoaneurysm. Vertebral arteries: The vertebral arteries are patent, without hemodynamically significant stenosis or occlusion. There is no evidence of dissection or aneurysm/pseudoaneurysm. Skeleton: There is no acute  osseous abnormality or suspicious osseous lesion. There is no visible canal hematoma.  Other neck: There is extensive dental disease. The soft tissues of the neck are unremarkable. Upper chest: There is emphysema in the lung apices. Review of the MIP images confirms the above findings CTA HEAD FINDINGS Anterior circulation: There is calcified plaque in the intracranial ICAs without significant stenosis or occlusion. The right M1 segment is patent. The branch vessels are patent, without proximal stenosis or occlusion. The left MCA is patent, without proximal stenosis or occlusion. The bilateral ACAs are patent, without proximal stenosis or occlusion. There is no aneurysm or AVM. Posterior circulation: The bilateral V4 segments are patent. The basilar artery is patent. The major cerebellar arteries appear patent. The bilateral PCAs are patent, without proximal stenosis or occlusion. There is a fetal origin of the right PCA. A left posterior communicating artery is also identified. There is no aneurysm or AVM. Venous sinuses: Patent. Anatomic variants: As above. Review of the MIP images confirms the above findings IMPRESSION: 1. No acute intracranial pathology.  No evidence of evolved infarct. 2. Mural adherent clot in the right internal carotid artery after the bifurcation resulting in high-grade stenosis, estimated at 80%. 3. More proximally in the right internal carotid artery just after the bifurcation, mixed plaque results in proximally 60% stenosis. 4. Mild plaque at the left carotid bifurcation without significant stenosis. Patent vertebral arteries in the neck. 5. Patent intracranial vasculature. 6. Emphysema. Findings of the noncontrast head CT were communicated to Dr Roda Shutters at 11:32 am. The the CTA results were discussed at 11:43 am. Electronically Signed   By: Lesia Hausen M.D.   On: 01/27/2023 11:57    CT ANGIO HEAD NECK W WO CM (CODE STROKE)   Result Date: 01/27/2023 CLINICAL DATA:  Code stroke.  Left-sided weakness, right-sided gaze. EXAM: CT ANGIOGRAPHY HEAD AND NECK TECHNIQUE:  Multidetector CT imaging of the head and neck was performed using the standard protocol during bolus administration of intravenous contrast. Multiplanar CT image reconstructions and MIPs were obtained to evaluate the vascular anatomy. Carotid stenosis measurements (when applicable) are obtained utilizing NASCET criteria, using the distal internal carotid diameter as the denominator. RADIATION DOSE REDUCTION: This exam was performed according to the departmental dose-optimization program which includes automated exposure control, adjustment of the mA and/or kV according to patient size and/or use of iterative reconstruction technique. CONTRAST:  75 cc Omnipaque 350 COMPARISON:  CT head 09/29/2021 FINDINGS: CT HEAD FINDINGS Brain: There is no evidence of acute intracranial hemorrhage, extra-axial fluid collection, or acute territorial infarct. Parenchymal volume is normal. The ventricles are normal in size. Gray-white differentiation is preserved. There is patchy hypodensity in the supratentorial white matter likely reflecting sequela of advanced chronic small-vessel ischemic change. Pituitary and suprasellar region are normal. There is no mass lesion there is no mass effect or midline shift. Vascular: No hyperdense vessel is seen. Skull: Normal. Negative for fracture or focal lesion. Sinuses/Orbits: The paranasal sinuses are clear. The globes and orbits are unremarkable. Other: None. ASPECTS [facility] Stroke Program Early CT Score) - Ganglionic level infarction (caudate, lentiform nuclei, internal capsule, insula, M1-M3 cortex): 7 - Supraganglionic infarction (M4-M6 cortex): 3 Total score (0-10 with 10 being normal): 10 CTA NECK FINDINGS Aortic arch: There is mild calcified plaque in the imaged aortic arch. The origins of the major branch vessels are patent. The subclavian arteries are patent to the level imaged. Right carotid system: The right common carotid artery is patent. There is mixed plaque of the  bifurcation  with noncalcified plaque projecting into the lumen resulting in hemodynamically significant stenosis just after the bifurcation, estimated at 60%. Distally, there is additional mural adherent clot in the lumen of the ICA resulting in high-grade stenosis, estimated at 80%. There is no raised dissection flap. The distal internal carotid artery is patent. The external carotid artery is patent. There is no evidence of aneurysm/pseudoaneurysm. Left carotid system: The left common carotid artery is patent. There is calcified plaque at the bifurcation without hemodynamically significant stenosis or occlusion. The internal and external carotid arteries are patent. There is no evidence of dissection or aneurysm/pseudoaneurysm. Vertebral arteries: The vertebral arteries are patent, without hemodynamically significant stenosis or occlusion. There is no evidence of dissection or aneurysm/pseudoaneurysm. Skeleton: There is no acute osseous abnormality or suspicious osseous lesion. There is no visible canal hematoma. Other neck: There is extensive dental disease. The soft tissues of the neck are unremarkable. Upper chest: There is emphysema in the lung apices. Review of the MIP images confirms the above findings CTA HEAD FINDINGS Anterior circulation: There is calcified plaque in the intracranial ICAs without significant stenosis or occlusion. The right M1 segment is patent. The branch vessels are patent, without proximal stenosis or occlusion. The left MCA is patent, without proximal stenosis or occlusion. The bilateral ACAs are patent, without proximal stenosis or occlusion. There is no aneurysm or AVM. Posterior circulation: The bilateral V4 segments are patent. The basilar artery is patent. The major cerebellar arteries appear patent. The bilateral PCAs are patent, without proximal stenosis or occlusion. There is a fetal origin of the right PCA. A left posterior communicating artery is also identified. There is no aneurysm or  AVM. Venous sinuses: Patent. Anatomic variants: As above. Review of the MIP images confirms the above findings IMPRESSION: 1. No acute intracranial pathology.  No evidence of evolved infarct. 2. Mural adherent clot in the right internal carotid artery after the bifurcation resulting in high-grade stenosis, estimated at 80%. 3. More proximally in the right internal carotid artery just after the bifurcation, mixed plaque results in proximally 60% stenosis. 4. Mild plaque at the left carotid bifurcation without significant stenosis. Patent vertebral arteries in the neck. 5. Patent intracranial vasculature. 6. Emphysema. Findings of the noncontrast head CT were communicated to Dr Roda Shutters at 11:32 am. The the CTA results were discussed at 11:43 am. Electronically Signed   By: Lesia Hausen M.D.   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 Pt with ongoing left hemiparesis, visual-spatial deficits, and cognitive deficits.    Does the need for close, 24 hr/day medical supervision in concert with the patient's rehab needs make it unreasonable for this patient to be served in a less intensive setting? Yes Co-Morbidities requiring supervision/potential complications:  -Coagulation disorder -HTN -tobacco abuse Due to bladder management, bowel management, safety, skin/wound care, disease management, medication administration, and patient education, does the patient require 24 hr/day rehab nursing? Yes Does the patient require coordinated care of a physician, rehab nurse, therapy disciplines of PT, OT , SLP to address physical and functional deficits in the context of the above medical diagnosis(es)? Yes Addressing deficits in the following areas: balance, endurance, locomotion, strength, transferring, bowel/bladder control, bathing, dressing, feeding, grooming, toileting, cognition, and psychosocial  support Can the patient actively participate in an intensive therapy program of at least 3 hrs of therapy per day at least 5 days per week?  Yes The potential for patient to make measurable gains while on inpatient rehab is excellent Anticipated functional outcomes upon discharge from inpatient rehab are supervision  with PT, supervision with OT, modified independent and supervision with SLP. Estimated rehab length of stay to reach the above functional goals is: 11-15 days Anticipated discharge destination: Home Overall Rehab/Functional Prognosis: excellent   POST ACUTE RECOMMENDATIONS: This patient's condition is appropriate for continued rehabilitative care in the following setting: CIR Patient has agreed to participate in recommended program. Yes Note that insurance prior authorization may be required for reimbursement for recommended care.   Comment: Rehab Admissions Coordinator to follow up        I have personally performed a face to face diagnostic evaluation of this patient. Additionally, I have examined the patient's medical record including any pertinent labs and radiographic images. If the physician assistant has documented in this note, I have reviewed and edited or otherwise concur with the physician assistant's documentation.   Thanks,   Ranelle Oyster, MD 01/29/2023          Routing History

## 2023-02-02 NOTE — Progress Notes (Signed)
Physical Therapy Treatment Patient Details Name: GLENDIA KEIM MRN: [id number] DOB: Mar 16, 1951 72's Date: 02/02/2023   History of Present Illness 72 yo female presents to [facility] on 5/29 with L weakness, R gaze. CTA showed right carotid mural thrombus with high grade stenosis, s/p s/p complete revascularization of R ICA on 5/29. MRI brain shows Scattered acute infarcts throughout the right cerebral hemisphere,  with more focal areas of infarction in the right frontal and parietal cortex, watershed territory, right caudate and lentiform nucleus, and right occipital lobe. PMH includes  PAD, HTN, HLD, tobacco abuse, CAD, osteoporosis.    PT Comments    Pt continues to make improvements in functional mobility and attending to their L side. Pt trial SPC in RUE for 80' today, pt with step-to gait pattern, reduced speed, and requiring to stop amb for dual tasks. Pt with increased gait speed, step-through pattern, and no need to stop amb for dual tasking when using RW. Pt continues to have some deficits in L attention but able to demo short-term memory recall and problem solving with min verbal cueing. Pt stands unsupported for 5 minutes at the sink for ADL's with no LOB. Pt will continue to benefit from skilled therapy during their acute admission to facilitate more improvements with the listed impairments.   Recommendations for follow up therapy are one component of a multi-disciplinary discharge planning process, led by the attending physician.  Recommendations may be updated based on patient status, additional functional criteria and insurance authorization.  Follow Up Recommendations       Assistance Recommended at Discharge Frequent or constant Supervision/Assistance  Patient can return home with the following A little help with walking and/or transfers;A little help with bathing/dressing/bathroom;Assistance with cooking/housework;Direct supervision/assist for medications management;Direct  supervision/assist for financial management;Assist for transportation;Help with stairs or ramp for entrance   Equipment Recommendations  None recommended by PT    Recommendations for Other Services       Precautions / Restrictions Precautions Precautions: Fall Precaution Comments: L inattention Restrictions Weight Bearing Restrictions: No     Mobility  Bed Mobility Overal bed mobility: Modified Independent Bed Mobility: Supine to Sit     Supine to sit: Modified independent (Device/Increase time)     General bed mobility comments: Increased time for BLE navigation to EOB.    Transfers Overall transfer level: Needs assistance Equipment used: Rolling walker (2 wheels), Straight cane Transfers: Sit to/from Stand Sit to Stand: Supervision           General transfer comment: slow to rise, improved anterior weight shift with BUE push off from bed with no AD    Ambulation/Gait Ambulation/Gait assistance: Min guard, Min assist Gait Distance (Feet): 200 Feet Assistive device: Rolling walker (2 wheels), Straight cane Gait Pattern/deviations: Step-to pattern, Step-through pattern, Decreased step length - left, Decreased stride length, Drifts right/left Gait velocity: decreased Gait velocity interpretation: <1.8 ft/sec, indicate of risk for recurrent falls   General Gait Details: PT trialed SPC in R hand for 80', pt with decreased gait speed, reduced L step and stride length, and step-to pattern. Finished w 120' using RW, pt demos increased gait speed and stride length, L drift intermittent as well.   Stairs             Wheelchair Mobility    Modified Rankin (Stroke Patients Only) Modified Rankin (Stroke Patients Only) Pre-Morbid Rankin Score: No symptoms Modified Rankin: Moderately severe disability     Balance Overall balance assessment: Needs assistance, History of Falls Sitting-balance  support: No upper extremity supported, Feet supported Sitting  balance-Leahy Scale: Good     Standing balance support: Single extremity supported, During functional activity, No upper extremity supported, Bilateral upper extremity supported Standing balance-Leahy Scale: Fair Standing balance comment:  (high guard with both single arm and no arm support)                            Cognition Arousal/Alertness: Awake/alert Behavior During Therapy: WFL for tasks assessed/performed Overall Cognitive Status: Impaired/Different from baseline Area of Impairment: Safety/judgement, Memory, Problem solving                     Memory: Decreased short-term memory   Safety/Judgement: Decreased awareness of safety, Decreased awareness of deficits Awareness: Emergent Problem Solving: Requires verbal cues General Comments: Showing improvements in safety/judgement - keeping RW within close proximity during functional tasks. Still requiring min verbal cues to attend to L side        Exercises      General Comments        Pertinent Vitals/Pain Pain Assessment Pain Assessment: Faces Faces Pain Scale: No hurt    Home Living                          Prior Function            PT Goals (current goals can now be found in the care plan section) Acute Rehab PT Goals Patient Stated Goal: back to walking at home PT Goal Formulation: With patient Time For Goal Achievement: 02/11/23 Potential to Achieve Goals: Good Progress towards PT goals: Progressing toward goals    Frequency    Min 4X/week      PT Plan Current plan remains appropriate    Co-evaluation              AM-PAC PT "6 Clicks" Mobility   Outcome Measure  Help needed turning from your back to your side while in a flat bed without using bedrails?: None Help needed moving from lying on your back to sitting on the side of a flat bed without using bedrails?: None Help needed moving to and from a bed to a chair (including a wheelchair)?: A Little Help  needed standing up from a chair using your arms (e.g., wheelchair or bedside chair)?: A Little Help needed to walk in hospital room?: A Little Help needed climbing 3-5 steps with a railing? : A Little 6 Click Score: 20    End of Session Equipment Utilized During Treatment: Gait belt Activity Tolerance: Patient tolerated treatment well Patient left: in bed;with bed alarm set;with call bell/phone within reach   PT Visit Diagnosis: Other abnormalities of gait and mobility (R26.89);History of falling (Z91.81)     Time: 0981-1914 PT Time Calculation (min) (ACUTE ONLY): 25 min  Charges:  $Gait Training: 8-22 mins $Therapeutic Activity: 8-22 mins                     Hendricks Milo, SPT  Acute Rehabilitation Services    Hendricks Milo 02/02/2023, 1:38 PM

## 2023-02-02 NOTE — H&P (Signed)
Physical Medicine and Rehabilitation Admission H&P        Chief Complaint  Patient presents with   Code Stroke  : HPI: Mary [name] is a 72 year old left-handed female with past medical history of PAD and had been placed on Coumadin per vascular surgery in [facility] 2021, tobacco use, CAD, hypertension, hyperlipidemia, .  Per chart review patient lives with spouse.  1 level home 3 steps to entry.  Independent prior to admission.  Presented 01/27/2023 with acute left-sided weakness, right forced gaze and left neglect.  Noted blood pressure 171/75.  Admission chemistries unremarkable except potassium 3.0.  CTH negative but CTA demonstrated right carotid mural thrombus with high-grade stenosis.  Echocardiogram with ejection fraction of 55 to 60% no wall motion abnormalities grade 1 diastolic dysfunction.  Patient not a TNK candidate because of Coumadin with INR 2.4 on admission.  Underwent right ICA thrombectomy per interventional radiology.  MRI performed demonstrated scattered infarcts throughout the right cerebral hemisphere with a focus in the right frontal and parietal cortex, watershed territory, right caudate and lentiform nucleus, and right occipital lobe.  Neurology follow-up placed on low-dose aspirin 81 mg daily and Plavix 75 mg daily.  There is no plan to resume chronic Coumadin therapy that she had been placed on in 2021 for peripheral arterial disease and this was discussed at length with vascular surgery.  Patient initially on Cleviprex for blood pressure control and since discontinued.  She is tolerating a regular consistency diet.  Therapy evaluations completed due to patient decreased functional mobility left-sided weakness was admitted for a comprehensive rehab program.     Pt reports LBM 2 days ago- denies constipation. Usually goes daily Voiding well Denies pain.    Review of Systems  Constitutional:  Negative for chills and fever.  HENT:  Negative for  hearing loss.   Eyes:  Negative for blurred vision and double vision.  Respiratory:  Negative for cough, shortness of breath and wheezing.   Cardiovascular:  Negative for chest pain, palpitations and leg swelling.  Gastrointestinal:  Positive for constipation. Negative for nausea and vomiting.  Genitourinary:  Negative for dysuria, flank pain and hematuria.  Musculoskeletal:  Positive for myalgias.  Skin:  Negative for rash.  Neurological:  Positive for weakness.  All other systems reviewed and are negative.       Past Medical History:  Diagnosis Date   Arthritis     Hyperlipidemia     Hypertension     Hyperuricemia     Osteoporosis     PAD (peripheral artery disease) (HCC) 02/2010    angiplasty, popliteal artery   Tobacco abuse           Past Surgical History:  Procedure Laterality Date   ANGIOPLASTY   July 2011    PAD- popliteal artery   COLONOSCOPY WITH PROPOFOL N/A 01/13/2018    Procedure: COLONOSCOPY WITH PROPOFOL;  Surgeon: Toney Reil, MD;  Location: [facility] ENDOSCOPY;  Service: Gastroenterology;  Laterality: N/A;   COLONOSCOPY WITH PROPOFOL N/A 01/14/2018    Procedure: COLONOSCOPY WITH PROPOFOL;  Surgeon: Pasty Spillers, MD;  Location: [facility] ENDOSCOPY;  Service: Endoscopy;  Laterality: N/A;   COLONOSCOPY WITH PROPOFOL N/A 07/06/2018    Procedure: COLONOSCOPY WITH PROPOFOL;  Surgeon: Pasty Spillers, MD;  Location: [facility] ENDOSCOPY;  Service: Endoscopy;  Laterality: N/A;   CORONARY ANGIOPLASTY       IR CT HEAD LTD   01/27/2023   IR PERCUTANEOUS ART THROMBECTOMY/INFUSION INTRACRANIAL  INC DIAG ANGIO   01/27/2023   JOINT REPLACEMENT       Leg Bypass Surgery Right 08/2010   OVARIAN CYST SURGERY   1980s   POPLITEAL ARTERY STENT Right 05/2010   POPLITEAL ARTERY STENT Right 07/2010   RADIOLOGY WITH ANESTHESIA N/A 01/27/2023    Procedure: IR WITH ANESTHESIA;  Surgeon: Radiologist, Medication, MD;  Location: [facility] OR;  Service: Radiology;  Laterality: N/A;   TOTAL HIP  ARTHROPLASTY Left 10/27/2017    Procedure: TOTAL HIP ARTHROPLASTY;  Surgeon: Deeann Saint, MD;  Location: [facility] ORS;  Service: Orthopedics;  Laterality: Left;         Family History  Problem Relation Age of Onset   Arthritis Mother     Diabetes Mother     Stroke Mother     Hypertension Mother     Dementia Mother     Cancer Father     Arthritis Sister     Seizures Sister     Diabetes Sister     Hypertension Sister     COPD Sister     Cancer Brother     Stroke Maternal Grandmother     Diabetes Paternal Grandmother     Cancer Sister     Diabetes Sister     Arthritis Sister     Diabetes Brother     Stroke Brother     Healthy Brother     Diabetes Brother     Stroke Brother     Cancer Brother     Heart disease Neg Hx     Breast cancer Neg Hx      Social History:  reports that she has been smoking cigarettes. She has a 20.00 pack-year smoking history. She has never used smokeless tobacco. She reports that she does not drink alcohol and does not use drugs. Allergies: No Known Allergies       Medications Prior to Admission  Medication Sig Dispense Refill   amLODipine (NORVASC) 5 MG tablet TAKE 1 TABLET EVERY DAY 90 tablet 1   atorvastatin (LIPITOR) 10 MG tablet TAKE 1 TABLET AT BEDTIME 90 tablet 0   cholecalciferol (VITAMIN D3) 25 MCG (1000 UNIT) tablet Take 2 tablets (2,000 Units total) by mouth daily.       vitamin E 400 UNIT capsule Take 180 Units by mouth daily.        warfarin (COUMADIN) 3 MG tablet Take 1 tablet (3 mg) by mouth one day a week, other 6 days take 5 mg tab poqd 15 tablet 0   warfarin (COUMADIN) 5 MG tablet Take 1 tab (5 mg) po daily, 6 days a week, and one day a week take 3 mg tab 60 tablet 0   acetaminophen (TYLENOL) 500 MG tablet Take 500 mg by mouth 2 (two) times daily as needed for moderate pain or headache.       nicotine (NICODERM CQ - DOSED IN MG/24 HR) 7 mg/24hr patch Place 1 patch (7 mg total) onto the skin daily. (Patient not taking: Reported on  01/27/2023) 28 patch 2          Home: Home Living Family/patient expects to be discharged to:: Inpatient rehab Living Arrangements: Spouse/significant other Available Help at Discharge: Family, Available 24 hours/day Type of Home: House Home Access: Stairs to enter [employer] of Steps: 3 Entrance Stairs-Rails: Left, Right Home Layout: One level Bathroom Shower/Tub: Engineer, manufacturing systems: Standard Bathroom Accessibility: Yes Home Equipment: Agricultural consultant (2 wheels)  Lives With: Spouse   Functional History:  Prior Function Prior Level of Function : Independent/Modified Independent, Driving Mobility Comments: No AD ADLs Comments: Indep in ADL and IADL   Functional Status:  Mobility: Bed Mobility Overal bed mobility: Modified Independent Bed Mobility: Sit to Supine Supine to sit: Min guard Sit to supine: Modified independent (Device/Increase time) General bed mobility comments: Increased time for BLE navigation to EOB. Transfers Overall transfer level: Needs assistance Equipment used: Rolling walker (2 wheels) Transfers: Sit to/from Stand Sit to Stand: Min guard Bed to/from chair/wheelchair/BSC transfer type:: Stand pivot Stand pivot transfers: Mod assist General transfer comment: Slow to rise, increased time for anterior weight shift Ambulation/Gait Ambulation/Gait assistance: Min guard, Min assist Gait Distance (Feet): 120 Feet Assistive device: Rolling walker (2 wheels) Gait Pattern/deviations: Step-through pattern, Decreased stride length, Narrow base of support, Drifts right/left (L drift) General Gait Details: Pt with slow pace using RW, L drift corrected through verbal cueing. Infrequent events of steps crossing midline. Gait velocity: decreased Gait velocity interpretation: <1.8 ft/sec, indicate of risk for recurrent falls Pre-gait activities: 2x HHA dynamic weight shifting: 3 colored notes on the floor to which pt was asked to toe tap with  their LLE, all notes positioned past L midline to facilitate L attention. Sidestepping EOB. Stairs: Yes Stairs assistance: Min guard Stair Management: One rail Left, One rail Right Number of Stairs: 5 General stair comments: Cues for attending to where LUE is on railing (tendency to leave it behind her)   ADL: ADL Overall ADL's : Needs assistance/impaired Eating/Feeding: Set up, Sitting Grooming: Minimal assistance, Standing, Applying deodorant, Wash/dry face, Wash/dry hands Grooming Details (indicate cue type and reason): perseverating some with washing hands; cueing needed to locate body wash as pt attempting to place lotion on washcloth in sink. Cues needed to locate sink then scan to L side to find deodorant - pt looking more on R side for items Upper Body Bathing: Minimal assistance, Standing Upper Body Bathing Details (indicate cue type and reason): washing underarms in standing, assist with clothing and problem solving ability to reach Lower Body Bathing: Min guard, Sit to/from stand Lower Body Bathing Details (indicate cue type and reason): able to bathe peri region in standing without assist Upper Body Dressing : Minimal assistance Lower Body Dressing: Min guard, Sit to/from stand, Sitting/lateral leans Lower Body Dressing Details (indicate cue type and reason): sock mgmt Toilet Transfer: Moderate assistance, +2 for physical assistance, +2 for safety/equipment, Stand-pivot, Ambulation Toilet Transfer Details (indicate cue type and reason): 2 person HHA; LLE incoordination stepping to [facility] Toileting- Clothing Manipulation and Hygiene: Sit to/from stand, Moderate assistance Toileting - Clothing Manipulation Details (indicate cue type and reason): for STS; pt able to lean forward in standing and wipe self. Functional mobility during ADLs: Minimal assistance, Rolling walker (2 wheels) General ADL Comments: limited by L inattention, focus on scanning sink for ADL items with use of sink as  anchor   Cognition: Cognition Overall Cognitive Status: Impaired/Different from baseline Arousal/Alertness: Awake/alert Orientation Level: Oriented X4 Year: 2024 Month: May Day of Week: Correct Attention: Focused, Sustained Focused Attention: Appears intact Sustained Attention: Appears intact Memory: Impaired Memory Impairment: Decreased short term memory Awareness: Impaired Problem Solving: Impaired Problem Solving Impairment: Verbal basic Executive Function: Reasoning Reasoning: Appears intact Cognition Arousal/Alertness: Awake/alert Behavior During Therapy: WFL for tasks assessed/performed Overall Cognitive Status: Impaired/Different from baseline Area of Impairment: Following commands, Safety/judgement, Awareness, Memory, Attention, Problem solving Current Attention Level: Selective Memory: Decreased short-term memory Following Commands: Follows one step commands with increased time Safety/Judgement: Decreased awareness  of deficits, Decreased awareness of safety Awareness: Emergent Problem Solving: Requires verbal cues General Comments: pleasant, appropriate in conversation and follows commands consistently. increased cueing needed due to L inattention ( to scan, awareness of what certain ADL bottles were on sink). question some memory deficits as pt reported having pancakes for lunch but unsure what she had for breakfast   Physical Exam: Blood pressure 125/72, pulse 69, temperature 98.3 F (36.8 C), temperature source Oral, resp. rate 16, height 5\' 4"  (1.626 m), weight 51.1 kg, SpO2 99 %. Physical Exam Constitutional:      Appearance: Normal appearance. She is ill-appearing.     Comments: BMI 19; sitting up eating lunch- appropriate; alert, NAD  HENT:     Head: Normocephalic and atraumatic.     Comments: L facial droop Missing most of teeth Tongue midline    Right Ear: External ear normal.     Left Ear: External ear normal.     Nose: Nose normal. No congestion.      Mouth/Throat:     Mouth: Mucous membranes are moist.     Pharynx: Oropharynx is clear. No oropharyngeal exudate.  Eyes:     General:        Right eye: No discharge.        Left eye: No discharge.     Extraocular Movements: Extraocular movements intact.  Cardiovascular:     Rate and Rhythm: Normal rate and regular rhythm.     Heart sounds: Normal heart sounds. No murmur heard.    No gallop.  Pulmonary:     Effort: Pulmonary effort is normal. No respiratory distress.     Breath sounds: Normal breath sounds. No wheezing, rhonchi or rales.  Abdominal:     General: Bowel sounds are normal. There is no distension.     Palpations: Abdomen is soft.     Tenderness: There is no abdominal tenderness.  Musculoskeletal:     Cervical back: Neck supple. No tenderness.     Comments: RUE/RLE- 5/5- in biceps, triceps, WE, grip and FA; as well as HF, KE, KF, DF and PF LUE 4+/5 in muscles tested and LLE 5-/5 in same muscles tested  Skin:    General: Skin is warm and dry.     Comments: IV L forearm- looks OK  Neurological:     Mental Status: She is alert.     Comments: Patient is alert.  She does have a right gaze preference and left-sided inattention with mild left facial droop.  Follows commands.  Voice is soft but she can provide her name and age. Has R gaze preference, but can look past midline with cues Intact to light touch in all 4 extremities  Psychiatric:        Mood and Affect: Mood normal.        Behavior: Behavior normal.        Lab Results Last 48 Hours        Results for orders placed or performed during the hospital encounter of 01/27/23 (from the past 48 hour(s))  Protime-INR     Status: Abnormal    Collection Time: 02/01/23  7:49 AM  Result Value Ref Range    Prothrombin Time 30.3 (H) 11.4 - 15.2 seconds    INR 2.9 (H) 0.8 - 1.2      Comment: (NOTE) INR goal varies based on device and disease states. Performed at [facility], [address] [place],  [place] [address]  Imaging Results (Last 48 hours)  No results found.         Blood pressure 125/72, pulse 69, temperature 98.3 F (36.8 C), temperature source Oral, resp. rate 16, height 5\' 4"  (1.626 m), weight 51.1 kg, SpO2 99 %.   Medical Problem List and Plan: 1. 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  Status post thrombectomy.             -patient may  shower             -ELOS/Goals: 11-15 days- supervision 2.  Antithrombotics: -DVT/anticoagulation:  Mechanical:  Antiembolism stockings, knee (TED hose) Bilateral lower extremities             -antiplatelet therapy: Aspirin 81 mg daily and Plavix 75 mg daily 3. Pain Management: Tylenol as needed 4. Mood/Behavior/Sleep: Provide emotional support             -antipsychotic agents: N/A 5. Neuropsych/cognition: This patient is capable of making decisions on her own behalf. 6. Skin/Wound Care: Routine skin checks 7. Fluids/Electrolytes/Nutrition: Routine in and outs with follow-up chemistries 8.  Tobacco abuse.  NicoDerm patch.  Provide counseling 9.  Hyperlipidemia.  Lipitor 10.  PAD.  Patient started on Coumadin many years ago by vascular surgery in [facility] for peripheral arterial disease.  She had not seen vascular surgery for 3 years.  Discussed at length with vascular surgery and chronic Coumadin has been discontinued no plan to resume. 11. Mild constipation- LBM 2 days ago- might need intervention 12. Prediabetes- A1c 6.2- monitor with BMP's and might need additional monitoring.      Mcarthur Rossetti Angiulli, PA-C 02/02/2023     I have personally performed a face to face diagnostic evaluation of this patient and formulated the key components of the plan.  Additionally, I have personally reviewed laboratory data, imaging studies, as well as relevant notes and concur with the physician assistant's documentation above.   The patient's  status has not changed from the original H&P.  Any changes in documentation from the acute care chart have been noted above.

## 2023-02-02 NOTE — Progress Notes (Signed)
   Pt is known to NIR. Planned for R ICA stent placement with Dr Corliss Skains at some point.  If pt is still IP --- we can plan for procedure for 2 week time frame.   If pt is OP--- we will need to know of DC planned so we can schedule for 4-6 week OP procedure in NIR  Please keep NIR aware of plans for this pt We will follow chart

## 2023-02-02 NOTE — Plan of Care (Signed)
  Problem: Education: Goal: Knowledge of secondary prevention will improve (MUST DOCUMENT ALL) Outcome: Progressing Goal: Knowledge of patient specific risk factors will improve Mary [name] N/A or DELETE if not current risk factor) Outcome: Progressing   Problem: Ischemic Stroke/TIA Tissue Perfusion: Goal: Complications of ischemic stroke/TIA will be minimized Outcome: Progressing   Problem: Health Behavior/Discharge Planning: Goal: Ability to manage health-related needs will improve Outcome: Progressing Goal: Goals will be collaboratively established with patient/family Outcome: Progressing   Problem: Self-Care: Goal: Ability to participate in self-care as condition permits will improve Outcome: Progressing Goal: Verbalization of feelings and concerns over difficulty with self-care will improve Outcome: Progressing Goal: Ability to communicate needs accurately will improve Outcome: Progressing   Problem: Nutrition: Goal: Dietary intake will improve Outcome: Progressing   Problem: Activity: Goal: Ability to return to baseline activity level will improve Outcome: Progressing

## 2023-02-02 NOTE — Progress Notes (Addendum)
Genice Rouge, MD  Physician Physical Medicine and Rehabilitation   PMR Pre-admission     Signed   Date of Service: 01/29/2023  2:15 PM  Related encounter: ED to Hosp-Admission (Discharged) from 01/27/2023 in [facility]   Signed      Show:Clear all [x] Written[x] Templated[x] Copied  Added by: [x] Standley Brooking, RN  [] Hover for details PMR Admission Coordinator Pre-Admission Assessment   Patient: Mary [name] is an 72 y.o., female MRN: [id number] DOB: [birth year] Height: 5\' 4"  (162.6 cm) Weight: 51.1 kg                                                                                                                                                  Insurance Information HMO:     PPO: yes     PCP:      IPA:      80/20:      OTHER:  PRIMARY: [employer]      Policy#: [id number]      Subscriber: pt CM Name: Tommi Rumps      Phone#: [phone] ext [phone]     Fax#: [phone] Pre-Cert#: [id number] approved for 7 days until 6/11. F/u with Karin Golden ext [phone] fax [phone]     Employer:  Benefits:  Phone #: [phone]     Name: 5/31 Eff. Date: 08/31/21     Deduct: $3500      Out of Pocket Max: $8300 CIR: $420 co pay per day days 1 until 5      SNF: no co pay per day days 1 until 20; $203 copay per day days 21 until 100 Outpatient: $25 per visit     Co-Pay: visits per medical neccesity Home Health: 100%      Co-Pay: visits per medical neccesity DME: 100%     Co-Pay: none Providers: in network  SECONDARY: none   Financial Counselor:       Phone#:    The Data processing manager" for patients in Inpatient Rehabilitation Facilities with attached "Privacy Act Statement-Health Care Records" was provided and verbally reviewed with: Patient and Family   Emergency Contact Information Contact Information       Name Relation Home Work Mobile    [place] D Spouse [phone]        Jim Like     [phone]          Current Medical History  Patient Admitting Diagnosis: CVA   History of Present Illness:  72 y.o. female with a history of PAD, CAD, on chronic coumadin who presented 5/29 with left hemiparesis and left inattention.    CTH negative but CTA demonstrated right carotid mural thrombus with high grade stenosis. Pt not TNK candidate because of coumadin.(INR 2.4)  Right ICA thrombectomy performed by NIR. MRI performed demonstrated scattered infarcts throughout the right cerebral hemisphere with a  focus in the right frontal and parietal cortex, watershed territory, right caudate and lentiform nucleus, and right occipital lobe. Pt was placed on full dose ASA and warfarin and INR goal increased to 2.5-3.5. ASA can be discontinued when INR at goal. Smoking cessation recommended.  Left visual-spatial awareness is a barrier. Pt following for dysphagia and cognition. Pt advanced to regular diet .   IR to follow as patient in need of Right ICA stenting. They recommend on day of discharge from rehab to discharge to acute hospital for stent placement.     Complete NIHSS TOTAL: 6   Patient's medical record from [facility] has been reviewed by the rehabilitation admission coordinator and physician.   Past Medical History      Past Medical History:  Diagnosis Date   Arthritis     Hyperlipidemia     Hypertension     Hyperuricemia     Osteoporosis     PAD (peripheral artery disease) (HCC) 02/2010    angiplasty, popliteal artery   Tobacco abuse      Has the patient had major surgery during 100 days prior to admission? Yes   Family History  family history includes Arthritis in her mother, sister, and sister; COPD in her sister; Cancer in her brother, brother, father, and sister; Dementia in her mother; Diabetes in her brother, brother, mother, paternal grandmother, sister, and sister; Healthy in her brother; Hypertension in her mother and sister; Seizures in her sister; Stroke in her brother, brother,  maternal grandmother, and mother.     Current Medications    Current Facility-Administered Medications:    acetaminophen (TYLENOL) tablet 650 mg, 650 mg, Oral, Q4H PRN, 650 mg at 01/30/23 2054 **OR** acetaminophen (TYLENOL) 160 MG/5ML solution 650 mg, 650 mg, Per Tube, Q4H PRN **OR** acetaminophen (TYLENOL) suppository 650 mg, 650 mg, Rectal, Q4H PRN, Marvel Plan, MD   aspirin chewable tablet 81 mg, 81 mg, Oral, Daily, de La Torre, Durant E, NP, 81 mg at 02/02/23 0805   atorvastatin (LIPITOR) tablet 10 mg, 10 mg, Oral, QHS, Marvel Plan, MD, 10 mg at 02/01/23 2108   clopidogrel (PLAVIX) tablet 75 mg, 75 mg, Oral, Daily, de Saintclair Halsted, Hiawatha E, NP, 75 mg at 02/02/23 0805   labetalol (NORMODYNE) injection 10-20 mg, 10-20 mg, Intravenous, Q2H PRN, Marvel Plan, MD   nicotine (NICODERM CQ - dosed in mg/24 hr) patch 7 mg, 7 mg, Transdermal, Daily, Marvel Plan, MD, 7 mg at 02/02/23 0805   Oral care mouth rinse, 15 mL, Mouth Rinse, PRN, Marvel Plan, MD   senna-docusate (Senokot-S) tablet 1 tablet, 1 tablet, Oral, QHS PRN, Marvel Plan, MD, 1 tablet at 01/31/23 2052   Patients Current Diet:  Diet Order                  Diet regular Room service appropriate? No; Fluid consistency: Thin  Diet effective now                       Precautions / Restrictions Precautions Precautions: Fall, Other (comment) Precaution Comments: L inattention Restrictions Weight Bearing Restrictions: No    Has the patient had 2 or more falls or a fall with injury in the past year?No   Prior Activity Level Community (5-7x/wk): Indepedent, retired, no AD; worked for [employer] for 32 years   Prior Functional Level Prior Function Prior Level of Function : Independent/Modified Independent, Driving Mobility Comments: No AD ADLs Comments: Indep in ADL and IADL  Self Care: Did the patient need help bathing, dressing, using the toilet or eating?  Independent   Indoor Mobility: Did the patient need assistance with  walking from room to room (with or without device)? Independent   Stairs: Did the patient need assistance with internal or external stairs (with or without device)? Independent   Functional Cognition: Did the patient need help planning regular tasks such as shopping or remembering to take medications? Independent   Patient Information Are you of Hispanic, Latino/a,or Spanish origin?: A. No, not of Hispanic, Latino/a, or Spanish origin What is your race?: B. Black or African American Do you need or want an interpreter to communicate with a doctor or health care staff?: 0. No   Patient's Response To:  Health Literacy and Transportation Is the patient able to respond to health literacy and transportation needs?: Yes Health Literacy - How often do you need to have someone help you when you read instructions, pamphlets, or other written material from your doctor or pharmacy?: Never In the past 12 months, has lack of transportation kept you from medical appointments or from getting medications?: No In the past 12 months, has lack of transportation kept you from meetings, work, or from getting things needed for daily living?: No   Home Assistive Devices / Equipment Home Assistive Devices/Equipment: Environmental consultant (specify type), Cane (specify quad or straight) Home Equipment: Rolling Walker (2 wheels)   Prior Device Use: Indicate devices/aids used by the patient prior to current illness, exacerbation or injury? None of the above   Current Functional Level Cognition   Arousal/Alertness: Awake/alert Overall Cognitive Status: Impaired/Different from baseline Current Attention Level: Selective Orientation Level: Oriented X4 Following Commands: Follows one step commands with increased time Safety/Judgement: Decreased awareness of deficits, Decreased awareness of safety General Comments: pleasant, appropriate in conversation and follows commands consistently. increased cueing needed due to L inattention (  to scan, awareness of what certain ADL bottles were on sink). question some memory deficits as pt reported having pancakes for lunch but unsure what she had for breakfast Attention: Focused, Sustained Focused Attention: Appears intact Sustained Attention: Appears intact Memory: Impaired Memory Impairment: Decreased short term memory Awareness: Impaired Problem Solving: Impaired Problem Solving Impairment: Verbal basic Executive Function: Reasoning Reasoning: Appears intact    Extremity Assessment (includes Sensation/Coordination)   Upper Extremity Assessment: LUE deficits/detail LUE Deficits / Details: good grip strength >R, but gross motor 4-/5; decr coordination, attention to L side LUE Coordination: decreased fine motor, decreased gross motor  Lower Extremity Assessment: Defer to PT evaluation LLE Deficits / Details: at least 3/5 hip flexion, knee flexion/extension, DF/PF, hip abd/add. Formal MMT limited, echo arrived to room LLE Coordination: decreased gross motor, decreased fine motor     ADLs   Overall ADL's : Needs assistance/impaired Eating/Feeding: Set up, Sitting Grooming: Minimal assistance, Standing, Applying deodorant, Wash/dry face, Wash/dry hands Grooming Details (indicate cue type and reason): perseverating some with washing hands; cueing needed to locate body wash as pt attempting to place lotion on washcloth in sink. Cues needed to locate sink then scan to L side to find deodorant - pt looking more on R side for items Upper Body Bathing: Minimal assistance, Standing Upper Body Bathing Details (indicate cue type and reason): washing underarms in standing, assist with clothing and problem solving ability to reach Lower Body Bathing: Min guard, Sit to/from stand Lower Body Bathing Details (indicate cue type and reason): able to bathe peri region in standing without assist Upper Body Dressing : Minimal assistance  Lower Body Dressing: Min guard, Sit to/from stand,  Sitting/lateral leans Lower Body Dressing Details (indicate cue type and reason): sock mgmt Toilet Transfer: Moderate assistance, +2 for physical assistance, +2 for safety/equipment, Stand-pivot, Ambulation Toilet Transfer Details (indicate cue type and reason): 2 person HHA; LLE incoordination stepping to [facility] Toileting- Clothing Manipulation and Hygiene: Sit to/from stand, Moderate assistance Toileting - Clothing Manipulation Details (indicate cue type and reason): for STS; pt able to lean forward in standing and wipe self. Functional mobility during ADLs: Minimal assistance, Rolling walker (2 wheels) General ADL Comments: limited by L inattention, focus on scanning sink for ADL items with use of sink as anchor     Mobility   Overal bed mobility: Modified Independent Bed Mobility: Sit to Supine Supine to sit: Min guard Sit to supine: Modified independent (Device/Increase time) General bed mobility comments: Increased time for BLE navigation to EOB.     Transfers   Overall transfer level: Needs assistance Equipment used: Rolling walker (2 wheels) Transfers: Sit to/from Stand Sit to Stand: Min guard Bed to/from chair/wheelchair/BSC transfer type:: Stand pivot Stand pivot transfers: Mod assist General transfer comment: Slow to rise, increased time for anterior weight shift     Ambulation / Gait / Stairs / Wheelchair Mobility   Ambulation/Gait Ambulation/Gait assistance: Min guard, Min assist Gait Distance (Feet): 120 Feet Assistive device: Rolling walker (2 wheels) Gait Pattern/deviations: Step-through pattern, Decreased stride length, Narrow base of support, Drifts right/left (L drift) General Gait Details: Pt with slow pace using RW, L drift corrected through verbal cueing. Infrequent events of steps crossing midline. Gait velocity: decreased Gait velocity interpretation: <1.8 ft/sec, indicate of risk for recurrent falls Pre-gait activities: 2x HHA dynamic weight shifting: 3 colored  notes on the floor to which pt was asked to toe tap with their LLE, all notes positioned past L midline to facilitate L attention. Sidestepping EOB. Stairs: Yes Stairs assistance: Min guard Stair Management: One rail Left, One rail Right Number of Stairs: 5 General stair comments: Cues for attending to where LUE is on railing (tendency to leave it behind her)     Posture / Balance Dynamic Sitting Balance Sitting balance - Comments: Reaches outside limits of anterior stabiliy to grasp RW. Balance Overall balance assessment: Needs assistance, History of Falls Sitting-balance support: No upper extremity supported, Feet supported Sitting balance-Leahy Scale: Good Sitting balance - Comments: Reaches outside limits of anterior stabiliy to grasp RW. Standing balance support: During functional activity, Bilateral upper extremity supported Standing balance-Leahy Scale: Fair Standing balance comment: reliant on external support     Special needs/care consideration For right ICA stent upon discharge from CIR        Previous Home Environment  Living Arrangements: Spouse/significant other  Lives With: Spouse Available Help at Discharge: Family, Available 24 hours/day Type of Home: House Home Layout: One level Home Access: Stairs to enter Entrance Stairs-Rails: Left, Right Entrance Stairs-Number of Steps: 3 Bathroom Shower/Tub: Associate Professor: Yes Home Care Services: No   Discharge Living Setting Plans for Discharge Living Setting: Patient's home, Lives with (comment) (spouse) Type of Home at Discharge: House Discharge Home Layout: One level Discharge Home Access: Stairs to enter Entrance Stairs-Rails: Right, Left Entrance Stairs-Number of Steps: 3 Discharge Bathroom Shower/Tub: Tub/shower unit Discharge Bathroom Toilet: Standard Discharge Bathroom Accessibility: Yes How Accessible: Accessible via walker Does the patient have any  problems obtaining your medications?: No   Social/Family/Support Systems Patient Roles: Spouse Contact Information: spouse, Joien Anticipated Caregiver: spouse  Anticipated Caregiver's Contact Information: see contacts Ability/Limitations of Caregiver: none Caregiver Availability: 24/7 Discharge Plan Discussed with Primary Caregiver: Yes Is Caregiver In Agreement with Plan?: Yes Does Caregiver/Family have Issues with Lodging/Transportation while Pt is in Rehab?: No   Goals Patient/Family Goal for Rehab: supervision with PT, OT and SLP Expected length of stay: ELOS 11 to 15 days Pt/Family Agrees to Admission and willing to participate: Yes Program Orientation Provided & Reviewed with Pt/Caregiver Including Roles  & Responsibilities: Yes   Decrease burden of Care through IP rehab admission: n/a   Possible need for SNF placement upon discharge:not anticipated   Patient Condition: This patient's medical and functional status has changed since the consult dated: 01/29/23 in which the Rehabilitation Physician determined and documented that the patient's condition is appropriate for intensive rehabilitative care in an inpatient rehabilitation facility. See "History of Present Illness" (above) for medical update. Functional changes are: min assist overall. Patient's medical and functional status update has been discussed with the Rehabilitation physician and patient remains appropriate for inpatient rehabilitation. Will admit to inpatient rehab today.   Preadmission Screen Completed By:  Clois Dupes, RN MSN 02/02/2023 10:40 AM ______________________________________________________________________   Discussed status with Dr. Berline Chough on 02/02/23 at 1040 and received approval for admission today.   Admission Coordinator:  Clois Dupes RN MSN time 1610 Date 02/02/23            Revision History

## 2023-02-03 DIAGNOSIS — I63511 Cerebral infarction due to unspecified occlusion or stenosis of right middle cerebral artery: Secondary | ICD-10-CM | POA: Diagnosis not present

## 2023-02-03 LAB — CBC WITH DIFFERENTIAL/PLATELET
Abs Immature Granulocytes: 0.02 10*3/uL (ref 0.00–0.07)
Basophils Absolute: 0 10*3/uL (ref 0.0–0.1)
Basophils Relative: 1 %
Eosinophils Absolute: 0.2 10*3/uL (ref 0.0–0.5)
Eosinophils Relative: 4 %
HCT: 35.7 % — ABNORMAL LOW (ref 36.0–46.0)
Hemoglobin: 12.3 g/dL (ref 12.0–15.0)
Immature Granulocytes: 0 %
Lymphocytes Relative: 50 %
Lymphs Abs: 2.3 10*3/uL (ref 0.7–4.0)
MCH: 30.4 pg (ref 26.0–34.0)
MCHC: 34.5 g/dL (ref 30.0–36.0)
MCV: 88.1 fL (ref 80.0–100.0)
Monocytes Absolute: 0.3 10*3/uL (ref 0.1–1.0)
Monocytes Relative: 7 %
Neutro Abs: 1.7 10*3/uL (ref 1.7–7.7)
Neutrophils Relative %: 38 %
Platelets: 256 10*3/uL (ref 150–400)
RBC: 4.05 MIL/uL (ref 3.87–5.11)
RDW: 13.1 % (ref 11.5–15.5)
WBC: 4.5 10*3/uL (ref 4.0–10.5)
nRBC: 0 % (ref 0.0–0.2)

## 2023-02-03 LAB — COMPREHENSIVE METABOLIC PANEL
ALT: 21 U/L (ref 0–44)
AST: 31 U/L (ref 15–41)
Albumin: 3.4 g/dL — ABNORMAL LOW (ref 3.5–5.0)
Alkaline Phosphatase: 58 U/L (ref 38–126)
Anion gap: 9 (ref 5–15)
BUN: 13 mg/dL (ref 8–23)
CO2: 26 mmol/L (ref 22–32)
Calcium: 9 mg/dL (ref 8.9–10.3)
Chloride: 103 mmol/L (ref 98–111)
Creatinine, Ser: 0.82 mg/dL (ref 0.44–1.00)
GFR, Estimated: >60 mL/min (ref >60)
Glucose, Bld: 159 mg/dL — ABNORMAL HIGH (ref 70–99)
Potassium: 3.7 mmol/L (ref 3.5–5.1)
Sodium: 138 mmol/L (ref 135–145)
Total Bilirubin: 0.7 mg/dL (ref 0.3–1.2)
Total Protein: 6.9 g/dL (ref 6.5–8.1)

## 2023-02-03 LAB — GLUCOSE, CAPILLARY
Glucose-Capillary: 87 mg/dL (ref 70–99)
Glucose-Capillary: 102 mg/dL — ABNORMAL HIGH (ref 70–99)
Glucose-Capillary: 97 mg/dL (ref 70–99)
Glucose-Capillary: 99 mg/dL (ref 70–99)

## 2023-02-03 MED ORDER — SENNOSIDES-DOCUSATE SODIUM 8.6-50 MG PO TABS
1.0000 | ORAL_TABLET | Freq: Two times a day (BID) | ORAL | Status: DC
  Administered 2023-02-03 – 2023-02-09 (×10): 1 via ORAL
  Filled 2023-02-03 (×12): qty 1

## 2023-02-03 NOTE — Progress Notes (Signed)
Inpatient Rehabilitation Care Coordinator Assessment and Plan Patient Details  Name: Mary [name] MRN: [id number] Date of Birth: [birth year]  Today's Date: 02/03/2023  Hospital Problems: Principal Problem:   Right middle cerebral artery stroke [facility])  Past Medical History:  Past Medical History:  Diagnosis Date   Arthritis    Hyperlipidemia    Hypertension    Hyperuricemia    Osteoporosis    PAD (peripheral artery disease) (HCC) 02/2010   angiplasty, popliteal artery   Tobacco abuse    Past Surgical History:  Past Surgical History:  Procedure Laterality Date   ANGIOPLASTY  July 2011   PAD- popliteal artery   COLONOSCOPY WITH PROPOFOL N/A 01/13/2018   Procedure: COLONOSCOPY WITH PROPOFOL;  Surgeon: Toney Reil, MD;  Location: [facility] ENDOSCOPY;  Service: Gastroenterology;  Laterality: N/A;   COLONOSCOPY WITH PROPOFOL N/A 01/14/2018   Procedure: COLONOSCOPY WITH PROPOFOL;  Surgeon: Pasty Spillers, MD;  Location: [facility] ENDOSCOPY;  Service: Endoscopy;  Laterality: N/A;   COLONOSCOPY WITH PROPOFOL N/A 07/06/2018   Procedure: COLONOSCOPY WITH PROPOFOL;  Surgeon: Pasty Spillers, MD;  Location: [facility] ENDOSCOPY;  Service: Endoscopy;  Laterality: N/A;   CORONARY ANGIOPLASTY     IR CT HEAD LTD  01/27/2023   IR PERCUTANEOUS ART THROMBECTOMY/INFUSION INTRACRANIAL INC DIAG ANGIO  01/27/2023   JOINT REPLACEMENT     Leg Bypass Surgery Right 08/2010   OVARIAN CYST SURGERY  1980s   POPLITEAL ARTERY STENT Right 05/2010   POPLITEAL ARTERY STENT Right 07/2010   RADIOLOGY WITH ANESTHESIA N/A 01/27/2023   Procedure: IR WITH ANESTHESIA;  Surgeon: Radiologist, Medication, MD;  Location: [facility] OR;  Service: Radiology;  Laterality: N/A;   TOTAL HIP ARTHROPLASTY Left 10/27/2017   Procedure: TOTAL HIP ARTHROPLASTY;  Surgeon: Deeann Saint, MD;  Location: [facility] ORS;  Service: Orthopedics;  Laterality: Left;   Social History:  reports that she has been smoking cigarettes. She has a 20.00 pack-year  smoking history. She has never used smokeless tobacco. She reports that she does not drink alcohol and does not use drugs.  Family / Support Systems Marital Status: Married Patient Roles: Spouse Spouse/Significant Other: Jarome Matin Children: n/a Other Supports: Sister, Madkins Anticipated Caregiver: spouse Ability/Limitations of Caregiver: none Caregiver Availability: 24/7 Family Dynamics: support from spouse  Social History Preferred language: English Religion: Baptist Cultural Background: independent retiring from Diplomatic Services operational officer, no AD Education: Some Charity fundraiser - How often do you need to have someone help you when you read instructions, pamphlets, or other written material from your doctor or pharmacy?: Never Writes: Yes Employment Status: Retired Marine scientist Issues: n/a Guardian/Conservator: n/a   Abuse/Neglect Abuse/Neglect Assessment Can Be Completed: Yes Physical Abuse: Denies Verbal Abuse: Denies Sexual Abuse: Denies Exploitation of patient/patient's resources: Denies Self-Neglect: Denies  Patient response to: Social Isolation - How often do you feel lonely or isolated from those around you?: Rarely  Emotional Status Pt's affect, behavior and adjustment status: Pleasant, patient on phone call. 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 Agencies: None Premorbid Home Care/DME Agencies: Other (Comment) (RW and SPC) Transportation available at discharge: spouse Is the patient able to respond to transportation needs?: Yes In the past 12 months, has lack  of transportation kept you from medical appointments or from getting  medications?: No In the past 12 months, has lack of transportation kept you from meetings, work, or from getting things needed for daily living?: No Resource referrals recommended: Neuropsychology  Discharge Planning Living Arrangements: Spouse/significant other Support Systems: Spouse/significant other Type of Residence: Private residence (1 level home, 3 steps) Insurance Resources: Media planner (specify) Engineer, materials) Financial Resources: Tree surgeon, SSD Financial Screen Referred: No Living Expenses: Own Money Management: Spouse, Patient Does the patient have any problems obtaining your medications?: No Home Management: Independent Patient/Family Preliminary Plans: Plans to remain independent, spouse able to assist if needed. Care Coordinator Barriers to Discharge: Insurance for SNF coverage, Lack of/limited family support, Decreased caregiver support Care Coordinator Anticipated Follow Up Needs: HH/OP Expected length of stay: 11-15 Days  Clinical Impression SW met with patient, introduced self and explained role. Patient anticipates discharging back home with spouse to assist. Patient lives in a 1 level home, 3 steps. Patient currently has a RW and SPC. No additional questions or concerns.  Andria Rhein 02/03/2023, 2:03 PM

## 2023-02-03 NOTE — Plan of Care (Signed)
  Problem: RH Grooming Goal: LTG Patient will perform grooming w/assist,cues/equip (OT) Description: LTG: Patient will perform grooming with assist, with/without cues using equipment (OT) Flowsheets (Taken 02/03/2023 1504) LTG: Pt will perform grooming with assistance level of: Set up assist    Problem: RH Bathing Goal: LTG Patient will bathe all body parts with assist levels (OT) Description: LTG: Patient will bathe all body parts with assist levels (OT) Flowsheets (Taken 02/03/2023 1504) LTG: Pt will perform bathing with assistance level/cueing: Supervision/Verbal cueing   Problem: RH Dressing Goal: LTG Patient will perform upper body dressing (OT) Description: LTG Patient will perform upper body dressing with assist, with/without cues (OT). Flowsheets (Taken 02/03/2023 1504) LTG: Pt will perform upper body dressing with assistance level of: Set up assist Goal: LTG Patient will perform lower body dressing w/assist (OT) Description: LTG: Patient will perform lower body dressing with assist, with/without cues in positioning using equipment (OT) Flowsheets (Taken 02/03/2023 1504) LTG: Pt will perform lower body dressing with assistance level of: Set up assist   Problem: RH Toileting Goal: LTG Patient will perform toileting task (3/3 steps) with assistance level (OT) Description: LTG: Patient will perform toileting task (3/3 steps) with assistance level (OT)  Flowsheets (Taken 02/03/2023 1504) LTG: Pt will perform toileting task (3/3 steps) with assistance level: Supervision/Verbal cueing   Problem: RH Functional Use of Upper Extremity Goal: LTG Patient will use RT/LT upper extremity as a (OT) Description: LTG: Patient will use right/left upper extremity as a stabilizer/gross assist/diminished/nondominant/dominant level with assist, with/without cues during functional activity (OT) Flowsheets (Taken 02/03/2023 1634) LTG: Use of upper extremity in functional activities: LUE as dominant level LTG:  Pt will use upper extremity in functional activity with assistance level of: Supervision/Verbal cueing   Problem: RH Toilet Transfers Goal: LTG Patient will perform toilet transfers w/assist (OT) Description: LTG: Patient will perform toilet transfers with assist, with/without cues using equipment (OT) Flowsheets (Taken 02/03/2023 1634) LTG: Pt will perform toilet transfers with assistance level of: Set up assist   Problem: RH Tub/Shower Transfers Goal: LTG Patient will perform tub/shower transfers w/assist (OT) Description: LTG: Patient will perform tub/shower transfers with assist, with/without cues using equipment (OT) Flowsheets (Taken 02/03/2023 1634) LTG: Pt will perform tub/shower stall transfers with assistance level of: Supervision/Verbal cueing LTG: Pt will perform tub/shower transfers from: Tub/shower combination

## 2023-02-03 NOTE — Plan of Care (Signed)
  Problem: RH Problem Solving Goal: LTG Patient will demonstrate problem solving for (SLP) Description: LTG:  Patient will demonstrate problem solving for basic/complex daily situations with cues  (SLP) Flowsheets (Taken 02/03/2023 1206) LTG Patient will demonstrate problem solving for: Supervision   Problem: RH Attention Goal: LTG Patient will demonstrate this level of attention during functional activites (SLP) Description: LTG:  Patient will will demonstrate this level of attention during functional activites (SLP) Flowsheets (Taken 02/03/2023 1206) LTG: Patient will demonstrate this level of attention during cognitive/linguistic activities with assistance of (SLP): Supervision   Problem: RH Awareness Goal: LTG: Patient will demonstrate awareness during functional activites type of (SLP) Description: LTG: Patient will demonstrate awareness during functional activites type of (SLP) Flowsheets (Taken 02/03/2023 1206) Patient will demonstrate during cognitive/linguistic activities awareness type of:  Intellectual  Emergent LTG: Patient will demonstrate awareness during cognitive/linguistic activities with assistance of (SLP): Supervision

## 2023-02-03 NOTE — Progress Notes (Signed)
PROGRESS NOTE   Subjective/Complaints:  No bladder issues, feels constipated no abd pain , LBM after laxative 2 d ago RIght shoulder pain for several months no falls or trauma   ROS- neg CP SOB N/V/D  Objective:   VAS [place] UPPER EXTREMITY VENOUS DUPLEX  Result Date: 02/02/2023 UPPER VENOUS STUDY  Patient Name:  Mary [name]  Date of Exam:   02/02/2023 Medical Rec #: [id number]      Accession #:    [id number] Date of Birth: 01/07/1951       Patient Gender: F Patient Age:   25 years Exam Location:  [facility] Procedure:      VAS [place] UPPER EXTREMITY VENOUS DUPLEX Referring Phys: Dewitt Hoes DE [place] --------------------------------------------------------------------------------  Indications: Swelling Risk Factors: Trauma. Comparison Study: No prior studies. Performing Technologist: Chanda Busing RVT  Examination Guidelines: A complete evaluation includes B-mode imaging, spectral Doppler, color Doppler, and power Doppler as needed of all accessible portions of each vessel. Bilateral testing is considered an integral part of a complete examination. Limited examinations for reoccurring indications may be performed as noted.  Right Findings: +----------+------------+---------+-----------+----------+-----------------+ RIGHT     CompressiblePhasicitySpontaneousProperties     Summary      +----------+------------+---------+-----------+----------+-----------------+ IJV           Full       Yes       Yes                                +----------+------------+---------+-----------+----------+-----------------+ Subclavian    Full       Yes       Yes                                +----------+------------+---------+-----------+----------+-----------------+ Axillary      Full       Yes       Yes                                +----------+------------+---------+-----------+----------+-----------------+ Brachial      Full                                                     +----------+------------+---------+-----------+----------+-----------------+ Radial        Full                                                    +----------+------------+---------+-----------+----------+-----------------+ Ulnar         Full                                                    +----------+------------+---------+-----------+----------+-----------------+  Cephalic      None                                  Age Indeterminate +----------+------------+---------+-----------+----------+-----------------+ Basilic       Full                                                    +----------+------------+---------+-----------+----------+-----------------+ Thrombus located in the cephalic vein is noted to be in the [facility] segment only.  Left Findings: +----------+------------+---------+-----------+----------+-------+ LEFT      CompressiblePhasicitySpontaneousPropertiesSummary +----------+------------+---------+-----------+----------+-------+ Subclavian    Full       Yes       Yes                      +----------+------------+---------+-----------+----------+-------+  Summary:  Right: No evidence of deep vein thrombosis in the upper extremity.  Left: No evidence of thrombosis in the subclavian. Findings consistent with age indeterminate superficial vein thrombosis involving the left cephalic vein.  *See table(s) above for measurements and observations.  Diagnosing physician: Waverly Ferrari MD Electronically signed by Waverly Ferrari MD on 02/02/2023 at 4:22:15 PM.    Final    No results for input(s): "WBC", "HGB", "HCT", "PLT" in the last 72 hours. No results for input(s): "NA", "K", "CL", "CO2", "GLUCOSE", "BUN", "CREATININE", "CALCIUM" in the last 72 hours.  Intake/Output Summary (Last 24 hours) at 02/03/2023 0726 Last data filed at 02/02/2023 1803 Gross per 24 hour  Intake 236 ml  Output --  Net 236 ml         Physical Exam: Vital Signs Blood pressure 135/81, pulse 78, temperature 97.7 F (36.5 C), temperature source Oral, resp. rate 15, height 5\' 4"  (1.626 m), weight 51 kg, SpO2 96 %.   General: No acute distress Mood and affect are appropriate Heart: Regular rate and rhythm no rubs murmurs or extra sounds Lungs: Clear to auscultation, breathing unlabored, no rales or wheezes Abdomen: Positive bowel sounds, soft nontender to palpation, nondistended Extremities: No clubbing, cyanosis, or edema Skin: No evidence of breakdown, no evidence of rash Neurologic: Cranial nerves II through XII intact, motor strength is 5/5 in bilateral deltoid, bicep, tricep, grip, hip flexor, knee extensors, ankle dorsiflexor and plantar flexor Sensory exam normal sensation to light touch and proprioception in bilateral upper and lower extremities Cerebellar exam normal finger to nose to finger as well as heel to shin in bilateral upper and lower extremities Musculoskeletal: Full range of motion in all 4 extremities. No joint swelling   Assessment/Plan: 1. Functional deficits which require 3+ hours per day of interdisciplinary therapy in a comprehensive inpatient rehab setting. Physiatrist is providing close team supervision and 24 hour management of active medical problems listed below. Physiatrist and rehab team continue to assess barriers to discharge/monitor patient progress toward functional and medical goals  Care Tool:  Bathing              Bathing assist       Upper Body Dressing/Undressing Upper body dressing        Upper body assist      Lower Body Dressing/Undressing Lower body dressing            Lower body assist       Toileting Toileting    Toileting  assist       Transfers Chair/bed transfer  Transfers assist           Locomotion Ambulation   Ambulation assist              Walk 10 feet activity   Assist           Walk 50 feet  activity   Assist           Walk 150 feet activity   Assist           Walk 10 feet on uneven surface  activity   Assist           Wheelchair     Assist               Wheelchair 50 feet with 2 turns activity    Assist            Wheelchair 150 feet activity     Assist          Blood pressure 135/81, pulse 78, temperature 97.7 F (36.5 C), temperature source Oral, resp. rate 15, height 5\' 4"  (1.626 m), weight 51 kg, SpO2 96 %.  Medical Problem List and Plan: 1. 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  Status post thrombectomy.             -patient may  shower             -ELOS/Goals: 7d days- supervision 2.  Antithrombotics: -DVT/anticoagulation:  Mechanical:  Antiembolism stockings, knee (TED hose) Bilateral lower extremities             -antiplatelet therapy: Aspirin 81 mg daily and Plavix 75 mg daily 3. Pain Management: Tylenol as needed 4. Mood/Behavior/Sleep: Provide emotional support             -antipsychotic agents: N/A 5. Neuropsych/cognition: This patient is capable of making decisions on her own behalf. 6. Skin/Wound Care: Routine skin checks 7. Fluids/Electrolytes/Nutrition: Routine in and outs with follow-up chemistries 8.  Tobacco abuse.  NicoDerm patch.  Provide counseling 9.  Hyperlipidemia.  Lipitor 10.  PAD.  Patient started on Coumadin many years ago by vascular surgery in [facility] for peripheral arterial disease.  She had not seen vascular surgery for 3 years.  Discussed at length with vascular surgery and chronic Coumadin has been discontinued no plan to resume. 11. Mild constipation- LBM 2 days ago- change senna S to 1 po BID 12. Prediabetes- A1c 6.2- monitor with BMP's and might need additional monitoring.     LOS: 1 days A FACE TO FACE EVALUATION WAS PERFORMED  Erick Colace 02/03/2023, 7:26 AM

## 2023-02-03 NOTE — Progress Notes (Signed)
Inpatient Rehabilitation Center Individual Statement of Services  Patient Name:  Mary [name]  Date:  02/03/2023  Welcome to the Inpatient Rehabilitation Center.  Our goal is to provide you with an individualized program based on your diagnosis and situation, designed to meet your specific needs.  With this comprehensive rehabilitation program, you will be expected to participate in at least 3 hours of rehabilitation therapies Monday-Friday, with modified therapy programming on the weekends.  Your rehabilitation program will include the following services:  Physical Therapy (PT), Occupational Therapy (OT), Speech Therapy (ST), 24 hour per day rehabilitation nursing, Therapeutic Recreaction (TR), Neuropsychology, Care Coordinator, Rehabilitation Medicine, Nutrition Services, Pharmacy Services, and Other  Weekly team conferences will be held on Wednesdays to discuss your progress.  Your Inpatient Rehabilitation Care Coordinator will talk with you frequently to get your input and to update you on team discussions.  Team conferences with you and your family in attendance may also be held.  Expected length of stay: 11-15 Days  Overall anticipated outcome: supervision   Depending on your progress and recovery, your program may change. Your Inpatient Rehabilitation Care Coordinator will coordinate services and will keep you informed of any changes. Your Inpatient Rehabilitation Care Coordinator's name and contact numbers are listed  below.  The following services may also be recommended but are not provided by the Inpatient Rehabilitation Center:   Home Health Rehabiltiation Services Outpatient Rehabilitation Services    Arrangements will be made to provide these services after discharge if needed.  Arrangements include referral to agencies that provide these services.  Your insurance has been verified to be:  [employer] Your primary doctor is:  Danelle Berry, PA-C  Pertinent information will  be shared with your doctor and your insurance company.  Inpatient Rehabilitation Care Coordinator:  Lavera Guise, [place] [phone] or [phone]  Information discussed with and copy given to patient by: Andria Rhein, 02/03/2023, 9:05 AM

## 2023-02-03 NOTE — Progress Notes (Signed)
Physical Therapy Assessment and Plan  Patient Details  Name: Mary [name] MRN: [id number] Date of Birth: Oct 19, 1950  PT Diagnosis: Abnormality of gait, Cognitive deficits, Difficulty walking, Hemiparesis dominant, Impaired cognition, and Muscle weakness Rehab Potential: Excellent ELOS: 7-10 days   Today's Date: 02/03/2023 PT Individual Time: 4098-1191 PT Individual Time Calculation (min): 74 min    Hospital Problem: Principal Problem:   Right middle cerebral artery stroke [facility])   Past Medical History:  Past Medical History:  Diagnosis Date   Arthritis    Hyperlipidemia    Hypertension    Hyperuricemia    Osteoporosis    PAD (peripheral artery disease) (HCC) 02/2010   angiplasty, popliteal artery   Tobacco abuse    Past Surgical History:  Past Surgical History:  Procedure Laterality Date   ANGIOPLASTY  July 2011   PAD- popliteal artery   COLONOSCOPY WITH PROPOFOL N/A 01/13/2018   Procedure: COLONOSCOPY WITH PROPOFOL;  Surgeon: Toney Reil, MD;  Location: [facility] ENDOSCOPY;  Service: Gastroenterology;  Laterality: N/A;   COLONOSCOPY WITH PROPOFOL N/A 01/14/2018   Procedure: COLONOSCOPY WITH PROPOFOL;  Surgeon: Pasty Spillers, MD;  Location: [facility] ENDOSCOPY;  Service: Endoscopy;  Laterality: N/A;   COLONOSCOPY WITH PROPOFOL N/A 07/06/2018   Procedure: COLONOSCOPY WITH PROPOFOL;  Surgeon: Pasty Spillers, MD;  Location: [facility] ENDOSCOPY;  Service: Endoscopy;  Laterality: N/A;   CORONARY ANGIOPLASTY     IR CT HEAD LTD  01/27/2023   IR PERCUTANEOUS ART THROMBECTOMY/INFUSION INTRACRANIAL INC DIAG ANGIO  01/27/2023   JOINT REPLACEMENT     Leg Bypass Surgery Right 08/2010   OVARIAN CYST SURGERY  1980s   POPLITEAL ARTERY STENT Right 05/2010   POPLITEAL ARTERY STENT Right 07/2010   RADIOLOGY WITH ANESTHESIA N/A 01/27/2023   Procedure: IR WITH ANESTHESIA;  Surgeon: Radiologist, Medication, MD;  Location: [facility] OR;  Service: Radiology;  Laterality: N/A;   TOTAL HIP ARTHROPLASTY  Left 10/27/2017   Procedure: TOTAL HIP ARTHROPLASTY;  Surgeon: Deeann Saint, MD;  Location: [facility] ORS;  Service: Orthopedics;  Laterality: Left;    Assessment & Plan Clinical Impression:  HPI: Mary [name] is a 72 year old left-handed female with past medical history of PAD and had been placed on Coumadin per vascular surgery in [facility] 2021, tobacco use, CAD, hypertension, hyperlipidemia, . Per chart review patient lives with spouse. 1 level home 3 steps to entry. Independent prior to admission. Presented 01/27/2023 with acute left-sided weakness, right forced gaze and left neglect. Noted blood pressure 171/75. Admission chemistries unremarkable except potassium 3.0. CTH negative but CTA demonstrated right carotid mural thrombus with high-grade stenosis. Echocardiogram with ejection fraction of 55 to 60% no wall motion abnormalities grade 1 diastolic dysfunction. Patient not a TNK candidate because of Coumadin with INR 2.4 on admission. Underwent right ICA thrombectomy per interventional radiology. MRI performed demonstrated scattered infarcts throughout the right cerebral hemisphere with a focus in the right frontal and parietal cortex, watershed territory, right caudate and lentiform nucleus, and right occipital lobe. Neurology follow-up placed on low-dose aspirin 81 mg daily and Plavix 75 mg daily. There is no plan to resume chronic Coumadin therapy that she had been placed on in 2021 for peripheral arterial disease and this was discussed at length with vascular surgery. Patient initially on Cleviprex for blood pressure control and since discontinued. She is tolerating a regular consistency diet. Therapy evaluations completed due to patient decreased functional mobility left-sided weakness was admitted for a comprehensive rehab program.   Patient currently requires min  with mobility secondary to muscle weakness, decreased coordination, Lt inattention, decreased attention to left and  left side neglect, decreased awareness, decreased problem solving, and decreased safety awareness, and decreased standing balance, decreased postural control, decreased balance strategies, and difficulty maintaining precautions.  Prior to hospitalization, patient was independent  with mobility and lived with Spouse in a House home.  Home access is 3Stairs to enter.  Patient will benefit from skilled PT intervention to maximize safe functional mobility, minimize fall risk, and decrease caregiver burden for planned discharge home with 24 hour assist.  Anticipate patient will benefit from follow up OP at discharge.  PT - End of Session Activity Tolerance: Tolerates 30+ min activity with multiple rests Endurance Deficit: Yes Endurance Deficit Description: muscular endurance PT Assessment Rehab Potential (ACUTE/IP ONLY): Excellent PT Barriers to Discharge: Home environment access/layout PT Plan PT Intensity: Minimum of 1-2 x/day ,45 to 90 minutes PT Frequency: 5 out of 7 days PT Duration Estimated Length of Stay: 7-10 days PT Treatment/Interventions: Ambulation/gait training;Balance/vestibular training;Cognitive remediation/compensation;Community reintegration;Discharge planning;Disease management/prevention;DME/adaptive equipment instruction;Functional mobility training;Functional electrical stimulation;Neuromuscular re-education;Patient/family education;Pain management;Stair training;Therapeutic Activities;Therapeutic Exercise;UE/LE Strength taining/ROM;UE/LE Coordination activities;Visual/perceptual remediation/compensation;Wheelchair propulsion/positioning;Psychosocial support PT Transfers Anticipated Outcome(s): Mod I PT Locomotion Anticipated Outcome(s): Mod I PT Recommendation Follow Up Recommendations: Outpatient PT Patient destination: Home Equipment Recommended: To be determined Equipment Details: pt currently owns none   PT  Evaluation Precautions/Restrictions Precautions Precautions: Fall Precaution Comments: L inattention Restrictions Weight Bearing Restrictions: No General   Vital SignsTherapy Vitals Temp: 98.5 F (36.9 C) Pulse Rate: 76 Resp: 16 BP: 120/73 Patient Position (if appropriate): Sitting Oxygen Therapy SpO2: 99 % O2 Device: Room Air Pain Pain Assessment Pain Scale: 0-10 Pain Score: 0-No pain Pain Interference Pain Interference Pain Effect on Sleep: 1. Rarely or not at all Pain Interference with Therapy Activities: 1. Rarely or not at all Pain Interference with Day-to-Day Activities: 1. 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 Leisure:  (word searches, can do a whole book in a day (extra large print)) Vision/Perception  Vision - History Baseline Vision: Wears glasses only for reading  Cognition Overall Cognitive Status: No family/caregiver present to determine baseline cognitive functioning Arousal/Alertness: Awake/alert Orientation Level: Oriented X4 Year: 2024 Month: June Day of Week: Correct Focused Attention: Appears intact Sustained Attention: Appears intact Alternating Attention: Appears intact Awareness: Impaired Awareness Impairment: Emergent impairment Problem Solving: Impaired Executive Function: Sequencing;Decision Making;Reasoning Reasoning: Impaired Sequencing: Impaired Decision Making:  Impaired Safety/Judgment: Impaired Sensation Sensation Light Touch: Appears Intact Hot/Cold: Not tested Proprioception: Appears Intact Stereognosis: Not tested Coordination Gross Motor Movements are Fluid and Coordinated: No Fine Motor Movements are Fluid and Coordinated: Yes Coordination and Movement Description: impaired by Lt hemi, weakness Heel Shin Test: intact Motor  Motor Motor: Hemiplegia Motor - Skilled Clinical Observations: Lt hemi, general weakness and impaired balance   Trunk/Postural Assessment  Cervical Assessment Cervical Assessment: Within Functional Limits Thoracic Assessment Thoracic Assessment: Within Functional Limits Lumbar Assessment Lumbar Assessment: Within Functional Limits Postural Control Postural Control: Deficits on evaluation Righting Reactions: delayed Protective Responses: delayed  Balance Balance Balance Assessed: Yes Standardized  Balance Assessment Standardized Balance Assessment: Timed Up and Go Test Timed Up and Go Test TUG: Normal TUG Normal TUG (seconds): 41.14 (55.67 with RW vs 41.14 with no AD) Static Sitting Balance Static Sitting - Balance Support: Feet supported;No upper extremity supported Static Sitting - Level of Assistance: 5: Stand by assistance Dynamic Sitting Balance Dynamic Sitting - Balance Support: Feet supported;No upper extremity supported Dynamic Sitting - Level of Assistance: 5: Stand by assistance Static Standing Balance Static Standing - Balance Support: During functional activity;No upper extremity supported;Bilateral upper extremity supported;Left upper extremity supported Static Standing - Level of Assistance: 5: Stand by assistance Dynamic Standing Balance Dynamic Standing - Balance Support: No upper extremity supported;Bilateral upper extremity supported;Left upper extremity supported;During functional activity Dynamic Standing - Level of Assistance: 4: Min assist Extremity Assessment      RLE  Assessment RLE Assessment: Exceptions to [facility] Active Range of Motion (AROM) Comments: WNL RLE Strength Right Hip Flexion: 4/5 Right Hip ABduction: 4+/5 Right Hip ADduction: 4+/5 Right Knee Flexion: 4+/5 Right Knee Extension: 4+/5 Right Ankle Dorsiflexion: 5/5 Right Ankle Plantar Flexion: 5/5 LLE Assessment LLE Assessment: Exceptions to [facility] Active Range of Motion (AROM) Comments: WNL LLE Strength Left Hip Flexion: 4-/5 Left Hip ABduction: 4-/5 Left Hip ADduction: 4/5 Left Knee Flexion: 4/5 Left Knee Extension: 4-/5 Left Ankle Dorsiflexion: 4/5 Left Ankle Plantar Flexion: 4/5  Care Tool Care Tool Bed Mobility Roll left and right activity   Roll left and right assist level: Supervision/Verbal cueing    Sit to lying activity   Sit to lying assist level: Supervision/Verbal cueing    Lying to sitting on side of bed activity   Lying to sitting on side of bed assist level: the ability to move from lying on the back to sitting on the side of the bed with no back support.: Supervision/Verbal cueing     Care Tool Transfers Sit to stand transfer   Sit to stand assist level: Minimal Assistance - Patient > 75%    Chair/bed transfer   Chair/bed transfer assist level: Minimal Assistance - Patient > 75%     Toilet transfer   Assist Level: Minimal Assistance - Patient > 75%    Car transfer   Car transfer assist level: Minimal Assistance - Patient > 75%      Care Tool Locomotion Ambulation   Assist level: Minimal Assistance - Patient > 75% Assistive device: Walker-rolling Max distance: 150  Walk 10 feet activity   Assist level: Minimal Assistance - Patient > 75% Assistive device: Walker-rolling   Walk 50 feet with 2 turns activity   Assist level: Minimal Assistance - Patient > 75% Assistive device: Walker-rolling  Walk 150 feet activity   Assist level: Minimal Assistance - Patient > 75% Assistive device: Walker-rolling  Walk 10 feet on uneven surfaces activity   Assist  level: Minimal Assistance - Patient > 75% Assistive device:  (hand rail and HHA)  Stairs   Assist level: Moderate Assistance - Patient - 50 - 74% Stairs assistive device: 2 hand rails    Walk up/down 1 step activity   Walk up/down 1 step (curb) assist level: Moderate Assistance - Patient - 50 - 74% Walk up/down 1 step or curb assistive device: 1 hand rail  Walk up/down 4 steps activity   Walk up/down 4 steps assist level: Minimal Assistance - Patient > 75% Walk up/down 4 steps assistive device: 2 hand rails  Walk up/down 12 steps activity   Walk up/down 12 steps assist level: Minimal Assistance -  Patient > 75% Walk up/down 12 steps assistive device: 2 hand rails  Pick up small objects from floor   Pick up small object from the floor assist level: Minimal Assistance - Patient > 75%    Wheelchair Is the patient using a wheelchair?: No Type of Wheelchair: Manual Wheelchair activity did not occur: N/A      Wheel 50 feet with 2 turns activity Wheelchair 50 feet with 2 turns activity did not occur: N/A    Wheel 150 feet activity Wheelchair 150 feet activity did not occur: N/A      Refer to Care Plan for Long Term Goals  SHORT TERM GOAL WEEK 1 PT Short Term Goal 1 (Week 1): STG's=LTG's  Recommendations for other services: None   Skilled Therapeutic Intervention Mobility Bed Mobility Bed Mobility: Rolling Right;Rolling Left;Supine to Sit;Sit to Supine Rolling Right: Supervision/verbal cueing Rolling Left: Supervision/Verbal cueing Supine to Sit: Supervision/Verbal cueing Sit to Supine: Supervision/Verbal cueing Transfers Transfers: Sit to Stand;Stand to Sit;Stand Pivot Transfers Sit to Stand: Minimal Assistance - Patient > 75% Stand to Sit: Minimal Assistance - Patient > 75% Stand Pivot Transfers: Minimal Assistance - Patient > 75% Stand Pivot Transfer Details: Manual facilitation for weight shifting;Verbal cues for gait pattern;Tactile cues for weight shifting Transfer  (Assistive device): None (and RW) Locomotion  Gait Assistive device: Rolling walker;None Gait Gait Pattern: Impaired Gait velocity: decreased Stairs / Additional Locomotion Stairs: Yes Stairs Assistance: Minimal Assistance - Patient > 75%;Moderate Assistance - Patient 50 - 74% Stair Management Technique: Two rails;Alternating pattern;Step to pattern;Forwards Height of Stairs: 6 Ramp: Minimal Assistance - Patient >75% Curb: Moderate Assistance - Patient 50 - 74% Wheelchair Mobility Wheelchair Mobility: No    PT eval completed addressing rehab process, PT purpose, POC, ELOS, and goals. Pt received in room in bathroom with NT assisting. Pt ambulated with RW and min assist from bathroom to [facility] for isolated/specific LE testing. P tambulatd to ortho gym with RW and min assist. PT completed car transfer with step in technique, min assist needed to steady balance and cues for safety with pt struggling for hand placement on car frame. Mod assist with HHA required to amb up ramp and Min assist with RW for navigation of ramp and pt steadier with bil UE support. Pt dependently transported to main gym for stair and curb negotiation. Initially pt ascending/descending steps with alternating pattern but as she fatigued pt leading Rt LE to ascend and Lt LE to descend for step to pattern. HHA provided for curb negotiation with Mod assist required to steady balance during step up. TUG completed with and without RW and pace slower and more cautious with walker, however slightly more steady. EOS pt dependently rolled to room in [facility] for time and energy management. Bed mobility completed with sueprvision, and pt agreeable to remain OOB in WC at EOS. Pt completed amb transfer with RW to [facility], alarm on, call bell in reach, and needs met. See functional navigator for further details regarding functional mobility performance.    Discharge Criteria: Patient will be discharged from PT if patient refuses treatment 3 consecutive  times without medical reason, if treatment goals not met, if there is a change in medical status, if patient makes no progress towards goals or if patient is discharged from hospital.  The above assessment, treatment plan, treatment alternatives and goals were discussed and mutually agreed upon: by patient   Wynn Maudlin, DPT Acute Rehabilitation Services Office [phone]  02/03/23 9:01 PM

## 2023-02-03 NOTE — Patient Care Conference (Signed)
Inpatient RehabilitationTeam Conference and Plan of Care Update Date: 02/03/2023   Time: 10:04 AM    Patient Name: Mary [name]      Medical Record Number: [id number]  Date of Birth: 1951-07-23 Sex: Female         Room/Bed: 4M09C/4M09C-01 Payor Info: Payor: [employer] MEDICARE / Plan: [employer] MEDICARE CHOICE PPO / Product Type: *No Product type* /    Admit Date/Time:  02/02/2023 12:31 PM  Primary Diagnosis:  Right middle cerebral artery stroke [facility])  Hospital Problems: Principal Problem:   Right middle cerebral artery stroke [facility])    Expected Discharge Date: Expected Discharge Date:  (ELOS 7 days)  Team Members Present: Physician leading conference: Dr. Claudette Laws Social Worker Present: Lavera Guise, BSW Nurse Present: Chana Bode, RN PT Present: Casimiro Needle, PT OT Present: Primitivo Gauze, OT SLP Present: Feliberto Gottron, SLP PPS Coordinator present : Edson Snowball, PT     Current Status/Progress Goal Weekly Team Focus  Bowel/Bladder   Pt is continent B/B  LBM 02/01/23   Will maintain B/B continence   Assist pt with toileting needs qshift/prn    Swallow/Nutrition/ Hydration   Eval Pending           ADL's   eval pending            Mobility   eval pending           Communication   Eval Pending           Safety/Cognition/ Behavioral Observations  Eval Pending            Pain   Denies pain at this time   Will be free from pain   Assess for pain qshift/prn    Skin   skin is intact   Will maintain skin intergrity with no breakdown  Assess pt skin qshift/prn for breakdown      Discharge Planning:    Discharge home with spouse; 1 level 3 ste bil rails  Team Discussion: Evals pending  Patient on target to meet rehab goals: Evals pending  *See Care Plan and progress notes for long and short-term goals.   Revisions to Treatment Plan:  N/a   Teaching Needs: Safety, medications, dietary modification, smoking cessation, transfers,  toileting, etc.  Current Barriers to Discharge: Decreased caregiver support and Home enviroment access/layout  Possible Resolutions to Barriers: Family education DME: has a Metallurgist Current Status: labile HTN, no evidence of left neglect  or hemiparesis  Barriers to Discharge: Uncontrolled Hypertension   Possible Resolutions to [employer] Focus: med adjustment after permissive HTN, PT/OT evals today   Continued Need for Acute Rehabilitation Level of Care: The patient requires daily medical management by a physician with specialized training in physical medicine and rehabilitation for the following reasons: Direction of a multidisciplinary physical rehabilitation program to maximize functional independence : Yes Medical management of patient stability for increased activity during participation in an intensive rehabilitation regime.: Yes Analysis of laboratory values and/or radiology reports with any subsequent need for medication adjustment and/or medical intervention. : Yes   I attest that I was present, lead the team conference, and concur with the assessment and plan of the team.   Chana Bode B 02/03/2023, 4:15 PM

## 2023-02-03 NOTE — Evaluation (Signed)
Occupational Therapy Assessment and Plan  Patient Details  Name: Mary [name] MRN: [id number] Date of Birth: 05-19-1951  OT Diagnosis: cognitive deficits and hemiplegia affecting dominant side Rehab Potential: Rehab Potential (ACUTE ONLY): Good ELOS: 7-10 days   Today's Date: 02/03/2023 OT Individual Time: 1425-1540 OT Individual Time Calculation (min): 75 min     Hospital Problem: Principal Problem:   Right middle cerebral artery stroke [facility])   Past Medical History:  Past Medical History:  Diagnosis Date   Arthritis    Hyperlipidemia    Hypertension    Hyperuricemia    Osteoporosis    PAD (peripheral artery disease) (HCC) 02/2010   angiplasty, popliteal artery   Tobacco abuse    Past Surgical History:  Past Surgical History:  Procedure Laterality Date   ANGIOPLASTY  July 2011   PAD- popliteal artery   COLONOSCOPY WITH PROPOFOL N/A 01/13/2018   Procedure: COLONOSCOPY WITH PROPOFOL;  Surgeon: Toney Reil, MD;  Location: [facility] ENDOSCOPY;  Service: Gastroenterology;  Laterality: N/A;   COLONOSCOPY WITH PROPOFOL N/A 01/14/2018   Procedure: COLONOSCOPY WITH PROPOFOL;  Surgeon: Pasty Spillers, MD;  Location: [facility] ENDOSCOPY;  Service: Endoscopy;  Laterality: N/A;   COLONOSCOPY WITH PROPOFOL N/A 07/06/2018   Procedure: COLONOSCOPY WITH PROPOFOL;  Surgeon: Pasty Spillers, MD;  Location: [facility] ENDOSCOPY;  Service: Endoscopy;  Laterality: N/A;   CORONARY ANGIOPLASTY     IR CT HEAD LTD  01/27/2023   IR PERCUTANEOUS ART THROMBECTOMY/INFUSION INTRACRANIAL INC DIAG ANGIO  01/27/2023   JOINT REPLACEMENT     Leg Bypass Surgery Right 08/2010   OVARIAN CYST SURGERY  1980s   POPLITEAL ARTERY STENT Right 05/2010   POPLITEAL ARTERY STENT Right 07/2010   RADIOLOGY WITH ANESTHESIA N/A 01/27/2023   Procedure: IR WITH ANESTHESIA;  Surgeon: Radiologist, Medication, MD;  Location: [facility] OR;  Service: Radiology;  Laterality: N/A;   TOTAL HIP ARTHROPLASTY Left 10/27/2017   Procedure: TOTAL  HIP ARTHROPLASTY;  Surgeon: Deeann Saint, MD;  Location: [facility] ORS;  Service: Orthopedics;  Laterality: Left;    Assessment & Plan Clinical Impression:Mary [name] is a 72 year old left-handed female with past medical history of PAD and had been placed on Coumadin per vascular surgery in [facility] 2021, tobacco use, CAD, hypertension, hyperlipidemia, . Per chart review patient lives with spouse. 1 level home 3 steps to entry. Independent prior to admission. Presented 01/27/2023 with acute left-sided weakness, right forced gaze and left neglect. Noted blood pressure 171/75. Admission chemistries unremarkable except potassium 3.0. CTH negative but CTA demonstrated right carotid mural thrombus with high-grade stenosis. Echocardiogram with ejection fraction of 55 to 60% no wall motion abnormalities grade 1 diastolic dysfunction. Patient not a TNK candidate because of Coumadin with INR 2.4 on admission. Underwent right ICA thrombectomy per interventional radiology. MRI performed demonstrated scattered infarcts throughout the right cerebral hemisphere with a focus in the right frontal and parietal cortex, watershed territory, right caudate and lentiform nucleus, and right occipital lobe. Neurology follow-up placed on low-dose aspirin 81 mg daily and Plavix 75 mg daily. There is no plan to resume chronic Coumadin therapy that she had been placed on in 2021 for peripheral arterial disease and this was discussed at length with vascular surgery. Patient initially on Cleviprex for blood pressure control and since discontinued. She is tolerating a regular consistency diet. Therapy evaluations completed due to patient decreased functional mobility left-sided weakness was admitted for a comprehensive rehab program.   Patient transferred to CIR on 02/02/2023 .  Patient currently requires supervision with basic self-care skills secondary to muscle weakness, decreased cardiorespiratoy endurance, decreased  motor planning, decreased attention to left, decreased awareness, decreased problem solving, and decreased safety awareness, and decreased sitting balance, decreased standing balance, hemiplegia, and decreased balance strategies.  Prior to hospitalization, patient could complete all ADLs and IADLs with independent .  Patient will benefit from skilled intervention to decrease level of assist with basic self-care skills prior to discharge home with care partner.  Anticipate patient will require intermittent supervision and follow up outpatient.  OT - End of Session Activity Tolerance: Tolerates 30+ min activity with multiple rests Endurance Deficit: Yes Endurance Deficit Description: needed seated rest breaks during BADLs OT Assessment Rehab Potential (ACUTE ONLY): Good OT Patient demonstrates impairments in the following area(s): Balance;Cognition;Endurance;Motor;Perception;Safety OT Basic ADL's Functional Problem(s): Grooming;Bathing;Dressing;Toileting OT Advanced ADL's Functional Problem(s): Simple Meal Preparation;Laundry;Light Housekeeping OT Transfers Functional Problem(s): Toilet;Tub/Shower OT Additional Impairment(s): Fuctional Use of Upper Extremity OT Plan OT Intensity: Minimum of 1-2 x/day, 45 to 90 minutes OT Frequency: 5 out of 7 days OT Duration/Estimated Length of Stay: 7-10 days OT Treatment/Interventions: Balance/vestibular training;Disease mangement/prevention;Neuromuscular re-education;Self Care/advanced ADL retraining;Therapeutic Exercise;Wheelchair propulsion/positioning;Cognitive remediation/compensation;DME/adaptive equipment instruction;Pain management;UE/LE Strength taining/ROM;Community reintegration;Functional electrical stimulation;Patient/family education;UE/LE Coordination activities;Discharge planning;Functional mobility training;Psychosocial support;Therapeutic Activities;Visual/perceptual remediation/compensation;Splinting/orthotics OT Basic Self-Care Anticipated  Outcome(s): setup-supervision OT Toileting Anticipated Outcome(s): setup OT Bathroom Transfers Anticipated Outcome(s): setup OT Recommendation Patient destination: Home Follow Up Recommendations: Home health OT;Outpatient OT Equipment Recommended: To be determined   OT Evaluation Precautions/Restrictions  Precautions Precautions: Fall Precaution Comments: L inattention Restrictions Weight Bearing Restrictions: No General Chart Reviewed: Yes Pain Pain Assessment Pain Scale: 0-10 Pain Score: 0-No pain Home Living/Prior Functioning Home Living Family/patient expects to be discharged to:: Private residence Living Arrangements: Spouse/significant other Available Help at Discharge: Family, Available 24 hours/day Type of Home: House Home Access: Stairs to enter [employer] of Steps: 3 Entrance Stairs-Rails: Left, Right Home Layout: One level Bathroom Shower/Tub: Associate Professor: Yes  Lives With: Spouse IADL History Homemaking Responsibilities: Yes Meal Prep Responsibility: Primary Laundry Responsibility: Primary Cleaning Responsibility: Primary Current License: Yes Mode of Transportation: Car Education: completed HS Prior Function Level of Independence: Independent with basic ADLs, Independent with homemaking with ambulation, Independent with gait, Independent with transfers  Able to Take Stairs?: Yes Driving: Yes Vocation: On disability 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 Leisure:  (word searches, can do a whol book in a day (extra large print)) Vision Baseline Vision/History: 1 Wears glasses (for reading) Ability to See in Adequate Light: 0 Adequate Patient Visual Report: No change from baseline Vision Assessment?: Vision impaired- to be further tested in functional context Additional Comments: mild L inattention Perception  Perception: Impaired Inattention/Neglect: Does not attend to left  visual field Praxis Praxis: Impaired Praxis Impairment Details: Motor planning Cognition Cognition Overall Cognitive Status: No family/caregiver present to determine baseline cognitive functioning Arousal/Alertness: Awake/alert Orientation Level: Person;Place;Situation Person: Oriented Place: Oriented Situation: Oriented Memory: Appears intact Memory Impairment: Decreased short term memory Decreased Short Term Memory: Verbal basic Attention: Focused;Sustained (further assessment needed of higher level attention tasks) Focused Attention: Appears intact Sustained Attention: Appears intact Awareness: Impaired Awareness Impairment: Emergent impairment Problem Solving: Impaired Problem Solving Impairment: Functional complex;Functional basic Executive Function: Sequencing;Reasoning Reasoning: Impaired Reasoning Impairment: Verbal basic Sequencing: Impaired Sequencing Impairment: Verbal basic Safety/Judgment: Impaired Brief Interview for Mental Status (BIMS) Repetition of Three Words (First Attempt): 3 Temporal Orientation: Year: Correct Temporal Orientation: Month: Accurate within 5 days Temporal Orientation: Day: Correct  Recall: "Sock": Yes, no cue required Recall: "Blue": Yes, no cue required Recall: "Bed": Yes, no cue required BIMS Summary Score: 15 Sensation Sensation Light Touch: Appears Intact Hot/Cold: Appears Intact Proprioception: Appears Intact Stereognosis: Appears Intact Coordination Gross Motor Movements are Fluid and Coordinated: No Fine Motor Movements are Fluid and Coordinated: No Coordination and Movement Description: impaired by Lt hemi, weakness Finger Nose Finger Test: slightly delayed on left with mild deviation Motor  Motor Motor: Hemiplegia Motor - Skilled Clinical Observations: Lt hemi, general weakness and impaired balance  Trunk/Postural Assessment  Cervical Assessment Cervical Assessment: Exceptions to [facility] (forward head) Thoracic  Assessment Thoracic Assessment: Exceptions to [facility] (kyphotic posture) Lumbar Assessment Lumbar Assessment:  (slight posterior pelvic tilt) Postural Control Postural Control: Deficits on evaluation Righting Reactions: delayed Protective Responses: delayed  Balance Static Sitting Balance Static Sitting - Balance Support: Feet supported;No upper extremity supported Static Sitting - Level of Assistance: 5: Stand by assistance Dynamic Sitting Balance Dynamic Sitting - Balance Support: Feet supported;No upper extremity supported Dynamic Sitting - Level of Assistance: 5: Stand by assistance Static Standing Balance Static Standing - Balance Support: During functional activity;No upper extremity supported Static Standing - Level of Assistance: 5: Stand by assistance Dynamic Standing Balance Dynamic Standing - Balance Support: No upper extremity supported;During functional activity Dynamic Standing - Level of Assistance: 4: Min assist Extremity/Trunk Assessment RUE Assessment RUE Assessment: Within Functional Limits LUE Assessment LUE Assessment: Exceptions to [facility] General Strength Comments: 3+/5 MMT LUE Body System: Neuro Brunstrum levels for arm and hand: Arm;Hand Brunstrum level for arm: Stage V Relative Independence from Synergy Brunstrum level for hand: Stage VI Isolated joint movements  Care Tool Care Tool Self Care Eating   Eating Assist Level: Set up assist    Oral Care    Oral Care Assist Level: Supervision/Verbal cueing    Bathing         Assist Level: Supervision/Verbal cueing    Upper Body Dressing(including orthotics)       Assist Level: Supervision/Verbal cueing    Lower Body Dressing (excluding footwear)     Assist for lower body dressing: Supervision/Verbal cueing    Putting on/Taking off footwear     Assist for footwear: Supervision/Verbal cueing       Care Tool Toileting Toileting activity   Assist for toileting: Supervision/Verbal cueing      Care Tool Bed Mobility Roll left and right activity        Sit to lying activity        Lying to sitting on side of bed activity         Care Tool Transfers Sit to stand transfer        Chair/bed transfer         Toilet transfer   Assist Level: Supervision/Verbal cueing     Care Tool Cognition  Expression of Ideas and Wants Expression of Ideas and Wants: 4. Without difficulty (complex and basic) - expresses complex messages without difficulty and with speech that is clear and easy to understand  Understanding Verbal and Non-Verbal Content Understanding Verbal and Non-Verbal Content: 4. Understands (complex and basic) - clear comprehension without cues or repetitions   Memory/Recall Ability Memory/Recall Ability : Current season;That he or she is in a hospital/hospital unit   Refer to Care Plan for Long Term Goals  SHORT TERM GOAL WEEK 1 OT Short Term Goal 1 (Week 1): STGs = LTGs due to ELOS  Recommendations for other services: None    Skilled Therapeutic Intervention ADL  ADL Grooming: Supervision/safety Where Assessed-Grooming: Standing at sink Upper Body Bathing: Supervision/safety Where Assessed-Upper Body Bathing: Standing at sink Lower Body Bathing: Supervision/safety Where Assessed-Lower Body Bathing: Standing at sink Upper Body Dressing: Supervision/safety Where Assessed-Upper Body Dressing: Sitting at sink Lower Body Dressing: Supervision/safety Where Assessed-Lower Body Dressing: Sitting at sink Toileting: Supervision/safety Where Assessed-Toileting: Teacher, adult education: Close supervision Toilet Transfer Method: Proofreader: Engineer, technical sales: Not assessed Film/video editor: Not assessed Mobility  Transfers Sit to Stand: Contact Guard/Touching assist;Minimal Assistance - Patient > 75% Stand to Sit: Minimal Assistance - Patient > 75%  Skilled Intervention:  Pt sitting up in w/c upon OT arrival.   Initial Evaluation Completed and collaborated with patient on OT POC.  Pt requested to use bathroom and then wash up at sink.  All self care and functional mobility completed per above levels of assist.  Pt demonstrated good alternating and sustained attention during ADLs and carried on conversation with therapist simultaneously without difficulty.  Pt needed only occasional cues to attend to left side throughout session.  Noted weakness of left dominant arm compared to right.  Pt requested to return to bed at end of session due to fatigue.  Call bell in reach, bed alarm on.    Discharge Criteria: Patient will be discharged from OT if patient refuses treatment 3 consecutive times without medical reason, if treatment goals not met, if there is a change in medical status, if patient makes no progress towards goals or if patient is discharged from hospital.  The above assessment, treatment plan, treatment alternatives and goals were discussed and mutually agreed upon: by patient  Amie Critchley 02/03/2023, 4:12 PM

## 2023-02-03 NOTE — Evaluation (Signed)
Speech Language Pathology Assessment and Plan  Patient Details  Name: Mary [name] MRN: [id number] Date of Birth: [birth year]  SLP Diagnosis: Cognitive Impairments  Rehab Potential: Good ELOS: 11-15 days    Today's Date: 02/03/2023 SLP Individual Time: 8295-6213 SLP Individual Time Calculation (min): 55 min   Hospital Problem: Principal Problem:   Right middle cerebral artery stroke [facility])  Past Medical History:  Past Medical History:  Diagnosis Date   Arthritis    Hyperlipidemia    Hypertension    Hyperuricemia    Osteoporosis    PAD (peripheral artery disease) (HCC) 02/2010   angiplasty, popliteal artery   Tobacco abuse    Past Surgical History:  Past Surgical History:  Procedure Laterality Date   ANGIOPLASTY  July 2011   PAD- popliteal artery   COLONOSCOPY WITH PROPOFOL N/A 01/13/2018   Procedure: COLONOSCOPY WITH PROPOFOL;  Surgeon: Toney Reil, MD;  Location: [facility] ENDOSCOPY;  Service: Gastroenterology;  Laterality: N/A;   COLONOSCOPY WITH PROPOFOL N/A 01/14/2018   Procedure: COLONOSCOPY WITH PROPOFOL;  Surgeon: Pasty Spillers, MD;  Location: [facility] ENDOSCOPY;  Service: Endoscopy;  Laterality: N/A;   COLONOSCOPY WITH PROPOFOL N/A 07/06/2018   Procedure: COLONOSCOPY WITH PROPOFOL;  Surgeon: Pasty Spillers, MD;  Location: [facility] ENDOSCOPY;  Service: Endoscopy;  Laterality: N/A;   CORONARY ANGIOPLASTY     IR CT HEAD LTD  01/27/2023   IR PERCUTANEOUS ART THROMBECTOMY/INFUSION INTRACRANIAL INC DIAG ANGIO  01/27/2023   JOINT REPLACEMENT     Leg Bypass Surgery Right 08/2010   OVARIAN CYST SURGERY  1980s   POPLITEAL ARTERY STENT Right 05/2010   POPLITEAL ARTERY STENT Right 07/2010   RADIOLOGY WITH ANESTHESIA N/A 01/27/2023   Procedure: IR WITH ANESTHESIA;  Surgeon: Radiologist, Medication, MD;  Location: [facility] OR;  Service: Radiology;  Laterality: N/A;   TOTAL HIP ARTHROPLASTY Left 10/27/2017   Procedure: TOTAL HIP ARTHROPLASTY;  Surgeon: Deeann Saint, MD;  Location:  [facility] ORS;  Service: Orthopedics;  Laterality: Left;    Assessment / Plan / Recommendation Clinical Impression  Mary [name] is a 72 year old left-handed female with past medical history of PAD and had been placed on Coumadin per vascular surgery in [facility] 2021, tobacco use, CAD, hypertension, hyperlipidemia, . Per chart review patient lives with spouse. 1 level home 3 steps to entry. Independent prior to admission. Presented 01/27/2023 with acute left-sided weakness, right forced gaze and left neglect. Noted blood pressure 171/75. Admission chemistries unremarkable except potassium 3.0. CTH negative but CTA demonstrated right carotid mural thrombus with high-grade stenosis. Echocardiogram with ejection fraction of 55 to 60% no wall motion abnormalities grade 1 diastolic dysfunction. Patient not a TNK candidate because of Coumadin with INR 2.4 on admission. Underwent right ICA thrombectomy per interventional radiology. MRI performed demonstrated scattered infarcts throughout the right cerebral hemisphere with a focus in the right frontal and parietal cortex, watershed territory, right caudate and lentiform nucleus, and right occipital lobe. Neurology follow-up placed on low-dose aspirin 81 mg daily and Plavix 75 mg daily. There is no plan to resume chronic Coumadin therapy that she had been placed on in 2021 for peripheral arterial disease and this was discussed at length with vascular surgery. Patient initially on Cleviprex for blood pressure control and since discontinued. She is tolerating a regular consistency diet. Therapy evaluations completed due to patient decreased functional mobility left-sided weakness was admitted for a comprehensive rehab program.    Skilled Therapeutic Interventions          Pt  seen for skilled SLP session to complete cognitive-linguistic assessment, speech/language screen, and therapeutic exercises.   Speech/Language: Conversational speech 100% fluent and  intelligible with no apparent dysarthria noted. Expressive and receptive language in conversation observed to be [facility]. Pt denied changes to speech or language at this time.   Cognition:  Pt presents with mild cognitive-linguistic deficits characterized by reduced working memory, reduced attention (?mild L neglect), reduced problem solving, reduced visuospatial skills, and reduced executive functions. Pt scored 21/30 on SLUMS. Subtest scores outlined below:  SLUMS:  Orientation: 3/3 Problem Solving: 1/3 Attention: Generative naming: 3/3 Delayed Recall: 4/5 Serial sequence reversal: 1/2 Clock Drawing: 1/4 Visuospatial: 2/2 Story Recall: 6/8  Total Score: 21/30  Therapeutic tasks: Pt completed visual safety awareness task with min-mod cues to attend to L side and identify various safety problems and discuss their solutions. Pt completed financial money counting task with <50% accuracy independently and benefited from moderate cues to improve accuracy. Noted that she often lost track of her count and had to restart or missed various coins/dollars (primarily on L side).   Pt would benefit from ongoing SLP services to address functional cognitive exercises, including financial management and medication management given pt completes these tasks independently at home.   Pt left sitting upright in w/c with chair alarm set and call bell in reach. Continue SLP Poc.    SLP Assessment  Patient will need skilled Speech Lanaguage Pathology Services during CIR admission    Recommendations  SLP Diet Recommendations: Age appropriate regular solids;Thin Liquid Administration via: Cup;Straw Medication Administration: Whole meds with liquid Supervision: Patient able to self feed Postural Changes and/or Swallow Maneuvers: Out of bed for meals Oral Care Recommendations: Oral care BID Patient destination: Home Follow up Recommendations: Home Health SLP;Outpatient SLP Equipment Recommended: None recommended  by SLP    SLP Frequency 1 to 3 out of 7 days   SLP Duration  SLP Intensity  SLP Treatment/Interventions 11-15 days  Minumum of 1-2 x/day, 30 to 90 minutes  Cognitive remediation/compensation;Cueing hierarchy;Functional tasks;Therapeutic Activities    Pain Pain Assessment Pain Scale: 0-10 Pain Score: 0-No pain  Prior Functioning Cognitive/Linguistic Baseline: Within functional limits Type of Home: House  Lives With: Spouse Available Help at Discharge: Family;Available 24 hours/day Education: completed HS Vocation: Retired  Architectural technologist Overall Cognitive Status: No family/caregiver present to determine baseline cognitive functioning Arousal/Alertness: Awake/alert Orientation Level: Oriented X4 Year: 2024 Month: June Day of Week: Correct Attention: Focused;Sustained (further assessment needed of higher level attention tasks) Focused Attention: Appears intact Sustained Attention: Appears intact Alternating Attention: Appears intact Memory: Impaired Memory Impairment: Decreased short term memory Decreased Short Term Memory: Verbal basic Awareness: Impaired Awareness Impairment: Emergent impairment Problem Solving: Impaired Problem Solving Impairment: Verbal basic Executive Function: Sequencing;Reasoning Reasoning: Impaired Reasoning Impairment: Verbal basic Sequencing: Impaired Sequencing Impairment: Verbal basic Safety/Judgment: Impaired  Comprehension Auditory Comprehension Overall Auditory Comprehension: Appears within functional limits for tasks assessed Expression Expression Primary Mode of Expression: Verbal Verbal Expression Overall Verbal Expression: Appears within functional limits for tasks assessed Oral Motor    Care Tool Care Tool Cognition Ability to hear (with hearing aid or hearing appliances if normally used Ability to hear (with hearing aid or hearing appliances if normally used): 0. Adequate - no difficulty in normal  conservation, social interaction, listening to TV   Expression of Ideas and Wants Expression of Ideas and Wants: 4. Without difficulty (complex and basic) - expresses complex messages without difficulty and with speech that is clear and easy to  understand   Understanding Verbal and Non-Verbal Content Understanding Verbal and Non-Verbal Content: 4. Understands (complex and basic) - clear comprehension without cues or repetitions  Memory/Recall Ability Memory/Recall Ability : Current season;That he or she is in a hospital/hospital unit   Bedside Swallowing Assessment General Date of Onset: 01/27/23 Previous Swallow Assessment: FEES 01/28/23- grossly WNL  Clinical swallow re-evaluation noted indicated given FEES completed 01/28/23 and pt denial of acute swallowing concerns at this time.   Short Term Goals: Week 1: SLP Short Term Goal 1 (Week 1): Pt will complete functional problem solving tasks with >75% accuracy given min cues. SLP Short Term Goal 2 (Week 1): Pt will complete med management tasks with >75% accuracy given min cues. SLP Short Term Goal 3 (Week 1): Pt will complete functional memory tasks with >75% accuracy given use of strategies as needed. SLP Short Term Goal 4 (Week 1): Pt will complete moderate level attention tasks with >75% accuracy with cues to attend to L side as needed.  Refer to Care Plan for Long Term Goals  Recommendations for other services: None   Discharge Criteria: Patient will be discharged from SLP if patient refuses treatment 3 consecutive times without medical reason, if treatment goals not met, if there is a change in medical status, if patient makes no progress towards goals or if patient is discharged from hospital.  The above assessment, treatment plan, treatment alternatives and goals were discussed and mutually agreed upon: by patient  Ellery Plunk 02/03/2023, 12:17 PM

## 2023-02-04 DIAGNOSIS — I63511 Cerebral infarction due to unspecified occlusion or stenosis of right middle cerebral artery: Secondary | ICD-10-CM | POA: Diagnosis not present

## 2023-02-04 LAB — GLUCOSE, CAPILLARY
Glucose-Capillary: 73 mg/dL (ref 70–99)
Glucose-Capillary: 125 mg/dL — ABNORMAL HIGH (ref 70–99)
Glucose-Capillary: 33 mg/dL — CL (ref 70–99)
Glucose-Capillary: 44 mg/dL — CL (ref 70–99)
Glucose-Capillary: 97 mg/dL (ref 70–99)
Glucose-Capillary: 107 mg/dL — ABNORMAL HIGH (ref 70–99)

## 2023-02-04 NOTE — Progress Notes (Signed)
Occupational Therapy Session Note  Patient Details  Name: Mary [name] MRN: [id number] Date of Birth: [birth year]  Today's Date: 02/04/2023 OT Individual Time: 4098-1191 OT Individual Time Calculation (min): 60 min    Short Term Goals: Week 1:  OT Short Term Goal 1 (Week 1): STGs = LTGs due to ELOS  Skilled Therapeutic Interventions/Progress Updates:    Pt received in bed ready for therapy. Pt declined showering or changing clothing this am.  Focus of therapy session on L side FMC and strength,  balance.   Pt used RW to ambulate to gym with light CGA initially then progress to close supervision.  Pt moving at slow guarded pace, cued to step faster to work on her efficiency.  Pt sat at table to engage in hand assessment.     R grasp 70-75 lbs, L grasp 60 lbs (L handed) R lat pinch 15, R lateral pinch 16 9 hole peg test R 36 sec, L side 70m25 sec  She then worked on grasp and pinch strength with use of med soft putty.  Pt provided with putty to use in her room.  She worked on [facility] with opening and closing pill bottles and putting small beads into bottles.  Placed very small pins into board  with L hands, she tended to want to use R hand so needed cues to focus on L hand.  Pt used Rw to ambulate to ADL apt to practice tub transfers. On the way there as she passed a doorway opening she had slight L inattention to her spacing with the Rw.  In the apt, she was able to use the grab bars to step in and out of the tub 2x with close S. She has grab bars at home, and a HH shower. Recommend a shower seat.   Pt walked back towards room with RW then practiced the last 75 feet without it with CGA. Pt was more guarded and taking smaller steps.  Pt sat on bed. Pt resting on bed with all needs met. Alarm set and call light in reach.    Therapy Documentation Precautions:  Precautions Precautions: Fall Precaution Comments: L inattention Restrictions Weight Bearing Restrictions: No   Pain: Pain  Assessment Pain Scale: 0-10 Pain Score: 0-No pain     Therapy/Group: Individual Therapy  Izayiah Tibbitts 02/04/2023, 10:52 AM

## 2023-02-04 NOTE — Progress Notes (Signed)
Occupational Therapy Session Note  Patient Details  Name: Mary [name] MRN: [id number] Date of Birth: [birth year]  Today's Date: 02/04/2023 OT Individual Time: 1423-1505 OT Individual Time Calculation (min): 42 min    Short Term Goals: Week 1:  OT Short Term Goal 1 (Week 1): STGs = LTGs due to ELOS  Skilled Therapeutic Interventions/Progress Updates:  Skilled OT intervention completed with focus on ambulatory endurance, dynamic balance and full body exercise during dance group activity. Pt received seated EOB, agreeable to session. No pain reported.  Retrieved shirt for pt, able to doff gown with min A for untying not, then donned UB shirt and gown as jacket with supervision in stance.   Pt intermittently sought external support for confidence with balance. She requested to use RW to ambulate to day room for security but agreeable to ambulate with L HHA instead for greater balance challenge. Ambulated > 300 ft to day room with L HHA and CGA.  Pt participated in series of BUE movements, dynamic standing, weight shifting in stance, toe tapping and higher level balance tasks during dance activity, with OT providing CGA though no LOB. Pt thoroughly enjoyed this activity.  Ambulated back to room with prior assist. Pt remained seated EOB, with bed alarm on/activated, and with all needs in reach at end of session.    Therapy Documentation Precautions:  Precautions Precautions: Fall Precaution Comments: L inattention Restrictions Weight Bearing Restrictions: No    Therapy/Group: Individual Therapy  Melvyn Novas, MS, OTR/L  02/04/2023, 3:25 PM

## 2023-02-04 NOTE — Progress Notes (Signed)
PROGRESS NOTE   Subjective/Complaints:  Need to go to restroom, no other c/os tolerated therapy , appetite reduced vs baseline at home per pt  ROS- neg CP SOB N/V/D  Objective:   VAS [place] UPPER EXTREMITY VENOUS DUPLEX  Result Date: 02/02/2023 UPPER VENOUS STUDY  Patient Name:  Mary [name]  Date of Exam:   02/02/2023 Medical Rec #: [id number]      Accession #:    [id number] Date of Birth: Jul 16, 1951       Patient Gender: F Patient Age:   72 years Exam Location:  [facility] Procedure:      VAS [place] UPPER EXTREMITY VENOUS DUPLEX Referring Phys: Dewitt Hoes DE LA TOR[place] --------------------------------------------------------------------------------  Indications: Swelling Risk Factors: Trauma. Comparison Study: No prior studies. Performing Technologist: Chanda Busing RVT  Examination Guidelines: A complete evaluation includes B-mode imaging, spectral Doppler, color Doppler, and power Doppler as needed of all accessible portions of each vessel. Bilateral testing is considered an integral part of a complete examination. Limited examinations for reoccurring indications may be performed as noted.  Right Findings: +----------+------------+---------+-----------+----------+-----------------+ RIGHT     CompressiblePhasicitySpontaneousProperties     Summary      +----------+------------+---------+-----------+----------+-----------------+ IJV           Full       Yes       Yes                                +----------+------------+---------+-----------+----------+-----------------+ Subclavian    Full       Yes       Yes                                +----------+------------+---------+-----------+----------+-----------------+ Axillary      Full       Yes       Yes                                +----------+------------+---------+-----------+----------+-----------------+ Brachial      Full                                                     +----------+------------+---------+-----------+----------+-----------------+ Radial        Full                                                    +----------+------------+---------+-----------+----------+-----------------+ Ulnar         Full                                                    +----------+------------+---------+-----------+----------+-----------------+  Cephalic      None                                  Age Indeterminate +----------+------------+---------+-----------+----------+-----------------+ Basilic       Full                                                    +----------+------------+---------+-----------+----------+-----------------+ Thrombus located in the cephalic vein is noted to be in the [facility] segment only.  Left Findings: +----------+------------+---------+-----------+----------+-------+ LEFT      CompressiblePhasicitySpontaneousPropertiesSummary +----------+------------+---------+-----------+----------+-------+ Subclavian    Full       Yes       Yes                      +----------+------------+---------+-----------+----------+-------+  Summary:  Right: No evidence of deep vein thrombosis in the upper extremity.  Left: No evidence of thrombosis in the subclavian. Findings consistent with age indeterminate superficial vein thrombosis involving the left cephalic vein.  *See table(s) above for measurements and observations.  Diagnosing physician: Waverly Ferrari MD Electronically signed by Waverly Ferrari MD on 02/02/2023 at 4:22:15 PM.    Final    Recent Labs    02/03/23 0743  WBC 4.5  HGB 12.3  HCT 35.7*  PLT 256   Recent Labs    02/03/23 0743  NA 138  K 3.7  CL 103  CO2 26  GLUCOSE 159*  BUN 13  CREATININE 0.82  CALCIUM 9.0    Intake/Output Summary (Last 24 hours) at 02/04/2023 1610 Last data filed at 02/03/2023 1858 Gross per 24 hour  Intake 480 ml  Output --  Net 480 ml         Physical Exam: Vital  Signs Blood pressure 90/77, pulse 69, temperature 98.5 F (36.9 C), temperature source Oral, resp. rate 16, height 5\' 4"  (1.626 m), weight 49.8 kg, SpO2 100 %.   General: No acute distress Mood and affect are appropriate Heart: Regular rate and rhythm no rubs murmurs or extra sounds Lungs: Clear to auscultation, breathing unlabored, no rales or wheezes Abdomen: Positive bowel sounds, soft nontender to palpation, nondistended Extremities: No clubbing, cyanosis, or edema Skin: No evidence of breakdown, no evidence of rash Neurologic: Cranial nerves II through XII intact, motor strength is 5/5 in bilateral deltoid, bicep, tricep, grip, hip flexor, knee extensors, ankle dorsiflexor and plantar flexor Sensory exam normal sensation to light touch and proprioception in bilateral upper and lower extremities  Musculoskeletal: Full range of motion in all 4 extremities. No joint swelling   Assessment/Plan: 1. Functional deficits which require 3+ hours per day of interdisciplinary therapy in a comprehensive inpatient rehab setting. Physiatrist is providing close team supervision and 24 hour management of active medical problems listed below. Physiatrist and rehab team continue to assess barriers to discharge/monitor patient progress toward functional and medical goals  Care Tool:  Bathing              Bathing assist Assist Level: Supervision/Verbal cueing     Upper Body Dressing/Undressing Upper body dressing        Upper body assist Assist Level: Supervision/Verbal cueing    Lower Body Dressing/Undressing Lower body dressing            Lower body assist Assist for  lower body dressing: Supervision/Verbal cueing     Toileting Toileting    Toileting assist Assist for toileting: Supervision/Verbal cueing     Transfers Chair/bed transfer  Transfers assist     Chair/bed transfer assist level: Minimal Assistance - Patient > 75%      Locomotion Ambulation   Ambulation assist      Assist level: Minimal Assistance - Patient > 75% Assistive device: Walker-rolling Max distance: 150   Walk 10 feet activity   Assist     Assist level: Minimal Assistance - Patient > 75% Assistive device: Walker-rolling   Walk 50 feet activity   Assist    Assist level: Minimal Assistance - Patient > 75% Assistive device: Walker-rolling    Walk 150 feet activity   Assist    Assist level: Minimal Assistance - Patient > 75% Assistive device: Walker-rolling    Walk 10 feet on uneven surface  activity   Assist     Assist level: Minimal Assistance - Patient > 75% Assistive device:  (hand rail and HHA)   Wheelchair     Assist Is the patient using a wheelchair?: No Type of Wheelchair: Manual Wheelchair activity did not occur: N/A         Wheelchair 50 feet with 2 turns activity    Assist    Wheelchair 50 feet with 2 turns activity did not occur: N/A       Wheelchair 150 feet activity     Assist  Wheelchair 150 feet activity did not occur: N/A       Blood pressure 90/77, pulse 69, temperature 98.5 F (36.9 C), temperature source Oral, resp. rate 16, height 5\' 4"  (1.626 m), weight 49.8 kg, SpO2 100 %.  Medical Problem List and Plan: 1. 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  Status post thrombectomy. WHile R carotid throbus was removed there is underlying carotid stenosis, with planned stent placement ~2wks              -patient may  shower             -ELOS/Goals: 7d days- supervision- notify IR Beckey Downing PA of d/c date so Right ICA stent can be scheduled as OP as pt will likely be discharged next wk 2.  Antithrombotics: -DVT/anticoagulation:  Mechanical:  Antiembolism stockings, knee (TED hose) Bilateral lower extremities UE doppler done neg for clot             -antiplatelet therapy: Aspirin 81 mg  daily and Plavix 75 mg daily 3. Pain Management: Tylenol as needed 4. Mood/Behavior/Sleep: Provide emotional support             -antipsychotic agents: N/A 5. Neuropsych/cognition: This patient is capable of making decisions on her own behalf. 6. Skin/Wound Care: Routine skin checks 7. Fluids/Electrolytes/Nutrition: Routine in and outs with follow-up chemistries 8.  Tobacco abuse.  NicoDerm patch.  Provide counseling 9.  Hyperlipidemia.  Lipitor 10.  PAD.  Patient started on Coumadin many years ago by vascular surgery in [facility] for peripheral arterial disease.  She had not seen vascular surgery for 3 years.  Discussed at length with vascular surgery and chronic Coumadin has been discontinued no plan to resume. 11. Mild constipation- LBM 2 days ago- change senna S to 1 po BID 12. Prediabetes- A1c 6.2- monitor with BMP's and might need additional monitoring.     LOS: 2 days A FACE TO FACE EVALUATION WAS PERFORMED  Victorino Sparrow Anuj Summons 02/04/2023, 7:22 AM

## 2023-02-04 NOTE — Plan of Care (Signed)
  Chart reviewed.  Expected D/C from Rehab next week.  Will schedule Cerebral intervention for 03/01/2023.  IR schedulers will contact patient with details prior to procedure - ex: NPO after MN and medication recommendations.  She is currently on 81 mg ASA and 75 mg Plavix - she will continue this prior to procedure and she should take these medication the morning of the procedure with a sip of water.  Virgilio Broadhead S Alnita Aybar PA-C 02/04/2023 10:09 AM

## 2023-02-04 NOTE — Progress Notes (Signed)
Physical Therapy Session Note  Patient Details  Name: SAIJA PLUNKET MRN: [id number] Date of Birth: 1950/10/01  Today's Date: 02/04/2023 PT Individual Time: 1331-1415 PT Individual Time Calculation (min): 44 min   Short Term Goals: Week 1:  PT Short Term Goal 1 (Week 1): STG's=LTG's  Skilled Therapeutic Interventions/Progress Updates: Pt presents supine in bed and agreeable to therapy.  Pt transfers sup to sitting on EOB w/ supervision.  Pt amb w/ RW and CGA x 130', pt holding back posts off ground!  Pt performed all further gait w/o AD.  Pt amb x 150' w/o AD and CGA, but fading to close supervision and verbal cues for relaxing shoulders , arm swing.  Pt amb on ramp and through mulch pit w/ CGA to min in pit.  Pt will reach for external support if available.  Pt performed forward and backward gait, cueing for LLE positioning to maintain balance, sidestepping.  Pt amb stepping over canes w/o LOB.  Pt amb multiple times of 120' including turns.  Pt returned to room and remained sitting EOB, visiting w/ spouse.  All needs in reach.     Therapy Documentation Precautions:  Precautions Precautions: Fall Precaution Comments: L inattention Restrictions Weight Bearing Restrictions: No General:   Vital Signs: Therapy Vitals Temp: 97.9 F (36.6 C) Temp Source: Oral Pulse Rate: 72 Resp: 16 BP: 116/63 Patient Position (if appropriate): Lying Oxygen Therapy SpO2: 100 % O2 Device: Room Air Pain:0/10       Balance: Balance Balance Assessed: Yes Standardized Balance Assessment Standardized Balance Assessment: Functional Gait Assessment Functional Gait  Assessment Gait assessed : Yes Gait Level Surface: Walks 20 ft, slow speed, abnormal gait pattern, evidence for imbalance or deviates 10-15 in outside of the 12 in walkway width. Requires more than 7 sec to ambulate 20 ft. Change in Gait Speed: Makes only minor adjustments to walking speed, or accomplishes a change in speed with significant  gait deviations, deviates 10-15 in outside the 12 in walkway width, or changes speed but loses balance but is able to recover and continue walking. Gait with Horizontal Head Turns: Performs head turns with moderate changes in gait velocity, slows down, deviates 10-15 in outside 12 in walkway width but recovers, can continue to walk. Gait with Vertical Head Turns: Performs task with moderate change in gait velocity, slows down, deviates 10-15 in outside 12 in walkway width but recovers, can continue to walk. Gait and Pivot Turn: Pivot turns safely in greater than 3 sec and stops with no loss of balance, or pivot turns safely within 3 sec and stops with mild imbalance, requires small steps to catch balance. Step Over Obstacle: Is able to step over 2 stacked shoe boxes taped together (9 in total height) without changing gait speed. No evidence of imbalance. Gait with Narrow Base of Support: Ambulates less than 4 steps heel to toe or cannot perform without assistance. Gait with Eyes Closed: Walks 20 ft, slow speed, abnormal gait pattern, evidence for imbalance, deviates 10-15 in outside 12 in walkway width. Requires more than 9 sec to ambulate 20 ft. Ambulating Backwards: Walks 20 ft, slow speed, abnormal gait pattern, evidence for imbalance, deviates 10-15 in outside 12 in walkway width. Steps: Two feet to a stair, must use rail. (reciporcal ascending 3/4 stiars with use of RHR - step to descending with use of BHR) Total Score: 12 Exercises:   Other Treatments:      Therapy/Group: Individual Therapy  Lucio Edward 02/04/2023, 3:43 PM

## 2023-02-04 NOTE — Progress Notes (Signed)
Physical Therapy Session Note  Patient Details  Name: Mary [name] MRN: [id number] Date of Birth: Oct 04, 1950  Today's Date: 02/04/2023 PT Individual Time: 0904-1004 PT Individual Time Calculation (min): 60 min   Short Term Goals: Week 1:  PT Short Term Goal 1 (Week 1): STG's=LTG's  Skilled Therapeutic Interventions/Progress Updates: Patient supine in bed on entrance to room. Patient alert and agreeable to PT session.   Patient reported no pain and endorses getting adequate rest the previous night. FGA performed this session.   Therapeutic Activity: Bed Mobility: Pt performed supine<>sit on EOB with supervision for safety. No VC required. Transfers: Pt performed sit<>stand transfers throughout session with and without RW with supervision for safety. Provided VC for anterior scoot to edge of surface.  Patient demonstrates increased fall risk as noted by score of 12/30 on  Functional Gait Assessment.   <22/30 = predictive of falls, <20/30 = fall in 6 months, <18/30 = predictive of falls in PD MCID: 5 points stroke population, 4 points geriatric population (ANPTA Core Set of Outcome Measures for Adults with Neurologic Conditions, 2018)   Gait Training:  Pt ambulated 140' using no AD with light CGA/supervision or safety. Pt presented with decreased cadence, decreased step height, decreased heel-strike with VC to increase heel-strike pattern. Patient also presented with downward gaze this trial from main gym to ortho gym (did not want to overload cuing first session).  Neuromuscular Re-ed: NMR facilitated during session with focus on proprioceptive feedback on L LE, motor coordination, and weight shifting. - B toe taps to 4" step. Patient provided with visual cues of dots on step to have a target to tap toes to. Patient required increased time and effort to adhere to cues (hence use of visual target per presentation of stepping fully on step instead of tapping. Patient also presented with  increased effort to toe tap per favoritism to apply WB to step (will do with cones next session). Patient dependently transported in [facility] back to room for time management.  NMR performed for improvements in motor control and coordination, balance, sequencing, judgement, and self confidence/ efficacy in performing all aspects of mobility at highest level of independence.   Patient supine in bed at end of session with brakes locked, bed alarm set, and all needs within reach.      Therapy Documentation Precautions:  Precautions Precautions: Fall Precaution Comments: L inattention Restrictions Weight Bearing Restrictions: No   Therapy/Group: Individual Therapy  Nalea Salce PTA 02/04/2023, 12:54 PM

## 2023-02-04 NOTE — Progress Notes (Signed)
Hypoglycemic Event  1141 CBG: 33 1143 CBG: 44   Treatment: 8 oz juice/soda  Symptoms: None  Follow-up CBG: Time:1206 CBG Result:125   Possible Reasons for Event: Unknown  Comments/MD notified:Notified Dan, PA at 1143. Pt is sitting up in bed states that she has no symptoms and feels fine.     Park Breed

## 2023-02-05 DIAGNOSIS — I63511 Cerebral infarction due to unspecified occlusion or stenosis of right middle cerebral artery: Secondary | ICD-10-CM | POA: Diagnosis not present

## 2023-02-05 LAB — PROTIME-INR
INR: 1.2 (ref 0.8–1.2)
Prothrombin Time: 15.4 seconds — ABNORMAL HIGH (ref 11.4–15.2)

## 2023-02-05 LAB — GLUCOSE, CAPILLARY: Glucose-Capillary: 111 mg/dL — ABNORMAL HIGH (ref 70–99)

## 2023-02-05 MED ORDER — ACETAMINOPHEN 325 MG PO TABS: 650.0000 mg | ORAL_TABLET | ORAL | Status: AC | PRN

## 2023-02-05 MED ORDER — ORAL CARE MOUTH RINSE: 15.0000 mL | OROMUCOSAL | Status: DC | PRN

## 2023-02-05 NOTE — Discharge Summary (Signed)
Physician Discharge Summary  Patient ID: Mary [name] MRN: [id number] DOB/AGE: Nov 08, 1950 72 y.o.  Admit date: 02/02/2023 Discharge date: 02/09/2023  Discharge Diagnoses:  Principal Problem:   Right middle cerebral artery stroke [facility]) DVT prophylaxis Tobacco use Hyperlipidemia PAD Constipation Prediabetes  Discharged Condition: Stable  Significant Diagnostic Studies: VAS [place] UPPER EXTREMITY VENOUS DUPLEX  Result Date: 02/02/2023 UPPER VENOUS STUDY  Patient Name:  Mary [name]  Date of Exam:   02/02/2023 Medical Rec #: [id number]      Accession #:    [id number] Date of Birth: Jan 03, 1951       Patient Gender: F Patient Age:   89 years Exam Location:  [facility] Procedure:      VAS [place] UPPER EXTREMITY VENOUS DUPLEX Referring Phys: Dewitt Hoes DE LA TORRE --------------------------------------------------------------------------------  Indications: Swelling Risk Factors: Trauma. Comparison Study: No prior studies. Performing Technologist: Chanda Busing RVT  Examination Guidelines: A complete evaluation includes B-mode imaging, spectral Doppler, color Doppler, and power Doppler as needed of all accessible portions of each vessel. Bilateral testing is considered an integral part of a complete examination. Limited examinations for reoccurring indications may be performed as noted.  Right Findings: +----------+------------+---------+-----------+----------+-----------------+ RIGHT     CompressiblePhasicitySpontaneousProperties     Summary      +----------+------------+---------+-----------+----------+-----------------+ IJV           Full       Yes       Yes                                +----------+------------+---------+-----------+----------+-----------------+ Subclavian    Full       Yes       Yes                                +----------+------------+---------+-----------+----------+-----------------+ Axillary      Full       Yes       Yes                                 +----------+------------+---------+-----------+----------+-----------------+ Brachial      Full                                                    +----------+------------+---------+-----------+----------+-----------------+ Radial        Full                                                    +----------+------------+---------+-----------+----------+-----------------+ Ulnar         Full                                                    +----------+------------+---------+-----------+----------+-----------------+ Cephalic      None  Age Indeterminate +----------+------------+---------+-----------+----------+-----------------+ Basilic       Full                                                    +----------+------------+---------+-----------+----------+-----------------+ Thrombus located in the cephalic vein is noted to be in the [facility] segment only.  Left Findings: +----------+------------+---------+-----------+----------+-------+ LEFT      CompressiblePhasicitySpontaneousPropertiesSummary +----------+------------+---------+-----------+----------+-------+ Subclavian    Full       Yes       Yes                      +----------+------------+---------+-----------+----------+-------+  Summary:  Right: No evidence of deep vein thrombosis in the upper extremity.  Left: No evidence of thrombosis in the subclavian. Findings consistent with age indeterminate superficial vein thrombosis involving the left cephalic vein.  *See table(s) above for measurements and observations.  Diagnosing physician: Waverly Ferrari MD Electronically signed by Waverly Ferrari MD on 02/02/2023 at 4:22:15 PM.    Final    VAS US CAROTID  Result Date: 01/29/2023 Carotid Arterial Duplex Study Patient Name:  Mary [name]  Date of Exam:   01/28/2023 Medical Rec #: [id number]      Accession #:    [id number] Date of Birth: 01/18/1951       Patient Gender: F Patient Age:    61 years Exam Location:  [facility] Procedure:      VAS US CAROTID Referring Phys: Scheryl Marten XU --------------------------------------------------------------------------------  Indications:       CVA, Weakness and right carotid mural thrombus, status post                    thrombectomy with residual 55-60% ICA stenosis. Risk Factors:      Hypertension, hyperlipidemia, current smoker, coronary artery                    disease, PAD. Other Factors:     On Coumadin for clotting disorder. Comparison Study:  No prior study Performing Technologist: Sherren Kerns RVS  Examination Guidelines: A complete evaluation includes B-mode imaging, spectral Doppler, color Doppler, and power Doppler as needed of all accessible portions of each vessel. Bilateral testing is considered an integral part of a complete examination. Limited examinations for reoccurring indications may be performed as noted.  Right Carotid Findings: +----------+--------+--------+--------+------------------+------------------+           PSV cm/sEDV cm/sStenosisPlaque DescriptionComments           +----------+--------+--------+--------+------------------+------------------+ CCA Prox  89      17                                intimal thickening +----------+--------+--------+--------+------------------+------------------+ CCA Mid                           focal and calcific                   +----------+--------+--------+--------+------------------+------------------+ CCA Distal82      17                                intimal thickening +----------+--------+--------+--------+------------------+------------------+ ICA Prox  77      20  1-39%   calcific          Shadowing          +----------+--------+--------+--------+------------------+------------------+ ICA Mid   108     20                                                   +----------+--------+--------+--------+------------------+------------------+ ICA  Distal91      27                                                   +----------+--------+--------+--------+------------------+------------------+ ECA       117     11                                                   +----------+--------+--------+--------+------------------+------------------+ +----------+--------+-------+--------+-------------------+           PSV cm/sEDV cmsDescribeArm Pressure (mmHG) +----------+--------+-------+--------+-------------------+ Subclavian70                                         +----------+--------+-------+--------+-------------------+ +---------+--------+--+--------+--+ VertebralPSV cm/s70EDV cm/s14 +---------+--------+--+--------+--+  Left Carotid Findings: +----------+--------+--------+--------+------------------+------------------+           PSV cm/sEDV cm/sStenosisPlaque DescriptionComments           +----------+--------+--------+--------+------------------+------------------+ CCA Prox  85      21                                intimal thickening +----------+--------+--------+--------+------------------+------------------+ CCA Distal81      16                                intimal thickening +----------+--------+--------+--------+------------------+------------------+ ICA Prox  82      23      1-39%   calcific                             +----------+--------+--------+--------+------------------+------------------+ ICA Mid   77      21                                                   +----------+--------+--------+--------+------------------+------------------+ ICA Distal75      23                                                   +----------+--------+--------+--------+------------------+------------------+ ECA       77      12                                                   +----------+--------+--------+--------+------------------+------------------+  +----------+--------+--------+--------+-------------------+  PSV cm/sEDV cm/sDescribeArm Pressure (mmHG) +----------+--------+--------+--------+-------------------+ [id number]                                          +----------+--------+--------+--------+-------------------+ +---------+--------+--+--------+--+ VertebralPSV cm/s72EDV cm/s16 +---------+--------+--+--------+--+   Summary: Right Carotid: Velocities in the right ICA are consistent with a 1-39% stenosis. Left Carotid: Velocities in the left ICA are consistent with a 1-39% stenosis. Vertebrals:  Bilateral vertebral arteries demonstrate antegrade flow. Subclavians: Normal flow hemodynamics were seen in bilateral subclavian              arteries. *See table(s) above for measurements and observations.  Electronically signed by Delia Heady MD on 01/29/2023 at 9:23:08 AM.    Final    IR PERCUTANEOUS ART THROMBECTOMY/INFUSION INTRACRANIAL INC DIAG ANGIO  Result Date: 01/29/2023 INDICATION: New onset right gaze deviation and left-sided neglect, and left hemiparesis. Near occlusive prominent thrombus in the right internal carotid artery. EXAM: 1. EMERGENT LARGE VESSEL OCCLUSION THROMBOLYSIS (anterior CIRCULATION) COMPARISON:  CT angiogram of the head and neck of Jan 27, 2023. MEDICATIONS: Ancef 2 g IV antibiotic was administered within 1 hour of the procedure. ANESTHESIA/SEDATION: General anesthesia. CONTRAST:  Omnipaque 300 approximately 80 mL. FLUOROSCOPY TIME:  Fluoroscopy Time: 17 minutes 24 seconds (947 mGy). COMPLICATIONS: None immediate. TECHNIQUE: Following a full explanation of the procedure along with the potential associated complications, an informed witnessed consent was obtained. The risks of intracranial hemorrhage of 10%, worsening neurological deficit, ventilator dependency, death and inability to revascularize were all reviewed in detail with the patient. The patient was then put under general anesthesia by the  Department of Anesthesiology at [facility]. The right groin was prepped and draped in the usual sterile fashion. Thereafter using modified Seldinger technique, transfemoral access into the right common femoral artery was obtained without difficulty. Over an 0.035 inch guidewire an 8 French 25 cm Arrow sheath was inserted. Through this, and also over an 0.035 inch guidewire a combination of a Berenstein 125 cm 5.5 [place] support catheter inside of an 087 95 cm balloon guide catheter was advanced to the right common carotid artery. The guidewire, and the support catheter were removed. Arteriograms were then performed through the balloon guide catheter extra cranially and intracranially. FINDINGS: The right common carotid bifurcation demonstrates the right external carotid artery and its major branches to be widely patent. The right internal carotid artery at the bulb has a circumferential atherosclerotic plaque associated with an approximately 3 cm irregular filling defect nearly occluding the internal carotid artery. More distally, patency is maintained of the distal cervical right ICA, the petrous, the cavernous and the supraclinoid right ICA. A right posterior communicating artery is seen opacifying the right posterior cerebral artery distribution. The right middle cerebral artery opacifies into the capillary and venous phases. A hypoplastic right A1 segment is noted with continuation into the A2 region. PROCEDURE: Through the balloon guide catheter, an 021 150 cm microcatheter was advanced over an 014 inch soft tip micro guidewire with a moderate J configuration to just proximal to the right internal carotid artery origin. At this time, proximal flow arrest was initiated by inflating the balloon guide catheter in the distal right common carotid artery. Also a combination of the micro guidewire and microcatheter was advanced through the internal carotid artery, slight proximal aspiration was applied with a  20 mL syringe as the combination was navigated to the petrous segment of the  right ICA. The aspiration was then stopped. The micro guidewire was removed. A 6.5 mm x 47 mm Embotrap retrieved retrieval device was then advanced to the distal end of the microcatheter and deployed such that the proximal portion of the device was in the distal cervical right ICA. The microcatheter was removed. With the proximal flow arrest intact, proximal aspiration was then performed at the hub of the balloon guide catheter with a Penumbra aspiration device, as the retriever was removed. An 071 Zoom aspiration catheter was then advanced over an 014 microcatheter as aspiration was performed. The 071 Zoom aspiration catheter was advanced into the right internal carotid artery proximally and distally and then distal to proximal flow arrest still intact. Thick irregular grayish white clot fragments were retained in the retriever, and in the canister. Following reversal of flow arrest in the right common carotid artery, a control arteriogram performed through the balloon guide catheter revealed no residual filling defects in the right internal carotid artery extra cranially and intracranially. Right MCA remained widely patent without evidence of intraluminal filling defects, or of occlusions. The non dominant right A1 and A2 segments also demonstrated no changes. The balloon guide wire was removed. Manual compression was applied with quick clot for 25 minutes to obtain hemostasis at the right groin puncture site. Distal pulses remained Dopplerable in both feet unchanged at the end of procedure. A flat panel CT of the brain demonstrated no evidence of intracranial hemorrhage. The patient's general anesthesia was then reversed and the patient was extubated. Upon recovery, the patient was able to obey simple commands though had no movement in her left arm and leg. She was then transferred to the neuro ICU for post revascularization care.  IMPRESSION: Status post right ICA extra cranially with 1 pass with a 6.5 mm x 47 mm Emboshield retrieval device and aspiration achieving complete revascularization. Approximately 65% stenosis of the right internal carotid artery at the bulb. PLAN: We will probably need placement of a stent at the right internal carotid artery proximally to prevent future ischemic strokes in the right MCA distribution. Electronically Signed   By: Julieanne Cotton M.D.   On: 01/29/2023 08:42   IR CT [facility]  Result Date: 01/29/2023 INDICATION: New onset right gaze deviation and left-sided neglect, and left hemiparesis. Near occlusive prominent thrombus in the right internal carotid artery. EXAM: 1. EMERGENT LARGE VESSEL OCCLUSION THROMBOLYSIS (anterior CIRCULATION) COMPARISON:  CT angiogram of the head and neck of Jan 27, 2023. MEDICATIONS: Ancef 2 g IV antibiotic was administered within 1 hour of the procedure. ANESTHESIA/SEDATION: General anesthesia. CONTRAST:  Omnipaque 300 approximately 80 mL. FLUOROSCOPY TIME:  Fluoroscopy Time: 17 minutes 24 seconds (947 mGy). COMPLICATIONS: None immediate. TECHNIQUE: Following a full explanation of the procedure along with the potential associated complications, an informed witnessed consent was obtained. The risks of intracranial hemorrhage of 10%, worsening neurological deficit, ventilator dependency, death and inability to revascularize were all reviewed in detail with the patient. The patient was then put under general anesthesia by the Department of Anesthesiology at [facility]. The right groin was prepped and draped in the usual sterile fashion. Thereafter using modified Seldinger technique, transfemoral access into the right common femoral artery was obtained without difficulty. Over an 0.035 inch guidewire an 8 French 25 cm Arrow sheath was inserted. Through this, and also over an 0.035 inch guidewire a combination of a Berenstein 125 cm 5.5 [place] support catheter  inside of an 087 95 cm balloon guide catheter was  advanced to the right common carotid artery. The guidewire, and the support catheter were removed. Arteriograms were then performed through the balloon guide catheter extra cranially and intracranially. FINDINGS: The right common carotid bifurcation demonstrates the right external carotid artery and its major branches to be widely patent. The right internal carotid artery at the bulb has a circumferential atherosclerotic plaque associated with an approximately 3 cm irregular filling defect nearly occluding the internal carotid artery. More distally, patency is maintained of the distal cervical right ICA, the petrous, the cavernous and the supraclinoid right ICA. A right posterior communicating artery is seen opacifying the right posterior cerebral artery distribution. The right middle cerebral artery opacifies into the capillary and venous phases. A hypoplastic right A1 segment is noted with continuation into the A2 region. PROCEDURE: Through the balloon guide catheter, an 021 150 cm microcatheter was advanced over an 014 inch soft tip micro guidewire with a moderate J configuration to just proximal to the right internal carotid artery origin. At this time, proximal flow arrest was initiated by inflating the balloon guide catheter in the distal right common carotid artery. Also a combination of the micro guidewire and microcatheter was advanced through the internal carotid artery, slight proximal aspiration was applied with a 20 mL syringe as the combination was navigated to the petrous segment of the right ICA. The aspiration was then stopped. The micro guidewire was removed. A 6.5 mm x 47 mm Embotrap retrieved retrieval device was then advanced to the distal end of the microcatheter and deployed such that the proximal portion of the device was in the distal cervical right ICA. The microcatheter was removed. With the proximal flow arrest intact, proximal aspiration  was then performed at the hub of the balloon guide catheter with a Penumbra aspiration device, as the retriever was removed. An 071 Zoom aspiration catheter was then advanced over an 014 microcatheter as aspiration was performed. The 071 Zoom aspiration catheter was advanced into the right internal carotid artery proximally and distally and then distal to proximal flow arrest still intact. Thick irregular grayish white clot fragments were retained in the retriever, and in the canister. Following reversal of flow arrest in the right common carotid artery, a control arteriogram performed through the balloon guide catheter revealed no residual filling defects in the right internal carotid artery extra cranially and intracranially. Right MCA remained widely patent without evidence of intraluminal filling defects, or of occlusions. The non dominant right A1 and A2 segments also demonstrated no changes. The balloon guide wire was removed. Manual compression was applied with quick clot for 25 minutes to obtain hemostasis at the right groin puncture site. Distal pulses remained Dopplerable in both feet unchanged at the end of procedure. A flat panel CT of the brain demonstrated no evidence of intracranial hemorrhage. The patient's general anesthesia was then reversed and the patient was extubated. Upon recovery, the patient was able to obey simple commands though had no movement in her left arm and leg. She was then transferred to the neuro ICU for post revascularization care. IMPRESSION: Status post right ICA extra cranially with 1 pass with a 6.5 mm x 47 mm Emboshield retrieval device and aspiration achieving complete revascularization. Approximately 65% stenosis of the right internal carotid artery at the bulb. PLAN: We will probably need placement of a stent at the right internal carotid artery proximally to prevent future ischemic strokes in the right MCA distribution. Electronically Signed   By: Julieanne Cotton  M.D.   On:  01/29/2023 08:42   ECHOCARDIOGRAM COMPLETE  Result Date: 01/28/2023    ECHOCARDIOGRAM REPORT   Patient Name:   Mary [name] Date of Exam: 01/28/2023 Medical Rec #:  [id number]     Height:       64.0 in Accession #:    [id number]    Weight:       112.7 lb Date of Birth:  November 27, 1950      BSA:          1.533 m Patient Age:    78 years      BP:           124/71 mmHg Patient Gender: F             HR:           68 bpm. Exam Location:  Inpatient Procedure: 2D Echo, Color Doppler and Cardiac Doppler Indications:    Stroke  History:        Patient has prior history of Echocardiogram examinations and                 Patient has no prior history of Echocardiogram examinations.                 Right ICA occlusion, PAD, Smoker, HTN, HLD, CAD.  Sonographer:    Milbert Coulter Referring Phys: [id number] JINDONG XU IMPRESSIONS  1. Left ventricular ejection fraction, by estimation, is 55 to 60%. The left ventricle has normal function. The left ventricle has no regional wall motion abnormalities. Left ventricular diastolic parameters are consistent with Grade I diastolic dysfunction (impaired relaxation).  2. Right ventricular systolic function is normal. The right ventricular size is normal. Tricuspid regurgitation signal is inadequate for assessing PA pressure.  3. The mitral valve is grossly normal. Trivial mitral valve regurgitation.  4. The aortic valve is tricuspid. There is mild calcification of the aortic valve. There is mild thickening of the aortic valve. Aortic valve regurgitation is not visualized. Aortic valve sclerosis/calcification is present, without any evidence of aortic stenosis.  5. The inferior vena cava is normal in size with greater than 50% respiratory variability, suggesting right atrial pressure of 3 mmHg. Comparison(s): No prior Echocardiogram. Conclusion(s)/Recommendation(s): No intracardiac source of embolism detected on this transthoracic study. Consider a transesophageal echocardiogram to exclude  cardiac source of embolism if clinically indicated. FINDINGS  Left Ventricle: Left ventricular ejection fraction, by estimation, is 55 to 60%. The left ventricle has normal function. The left ventricle has no regional wall motion abnormalities. The left ventricular internal cavity size was normal in size. There is  no left ventricular hypertrophy. Left ventricular diastolic parameters are consistent with Grade I diastolic dysfunction (impaired relaxation). Right Ventricle: The right ventricular size is normal. No increase in right ventricular wall thickness. Right ventricular systolic function is normal. Tricuspid regurgitation signal is inadequate for assessing PA pressure. Left Atrium: Left atrial size was normal in size. Right Atrium: Right atrial size was normal in size. Pericardium: There is no evidence of pericardial effusion. Mitral Valve: The mitral valve is grossly normal. Trivial mitral valve regurgitation. Tricuspid Valve: The tricuspid valve is normal in structure. Tricuspid valve regurgitation is trivial. Aortic Valve: The aortic valve is tricuspid. There is mild calcification of the aortic valve. There is mild thickening of the aortic valve. Aortic valve regurgitation is not visualized. Aortic valve sclerosis/calcification is present, without any evidence of aortic stenosis. Aortic valve mean gradient measures 4.0 mmHg. Aortic valve peak gradient measures 7.3 mmHg. Aortic valve area, by  VTI measures 1.23 cm. Pulmonic Valve: The pulmonic valve was not well visualized. Pulmonic valve regurgitation is trivial. Aorta: The aortic root is normal in size and structure. Venous: The inferior vena cava is normal in size with greater than 50% respiratory variability, suggesting right atrial pressure of 3 mmHg. IAS/Shunts: The atrial septum is grossly normal.  LEFT VENTRICLE PLAX 2D LVIDd:         3.60 cm     Diastology LVIDs:         2.50 cm     LV e' medial:    9.68 cm/s LV PW:         1.10 cm     LV E/e'  medial:  8.9 LV IVS:        1.00 cm     LV e' lateral:   12.70 cm/s LVOT diam:     1.70 cm     LV E/e' lateral: 6.7 LV SV:         41 LV SV Index:   27 LVOT Area:     2.27 cm  LV Volumes (MOD) LV vol d, MOD A2C: 38.2 ml LV vol d, MOD A4C: 42.6 ml LV vol s, MOD A2C: 10.4 ml LV vol s, MOD A4C: 16.1 ml LV SV MOD A2C:     27.8 ml LV SV MOD A4C:     42.6 ml LV SV MOD BP:      28.3 ml RIGHT VENTRICLE RV Basal diam:  2.70 cm RV Mid diam:    2.30 cm RV S prime:     11.70 cm/s TAPSE (M-mode): 2.3 cm LEFT ATRIUM             Index        RIGHT ATRIUM           Index LA diam:        2.50 cm 1.63 cm/m   RA Area:     12.10 cm LA Vol (A2C):   34.1 ml 22.25 ml/m  RA Volume:   27.70 ml  18.07 ml/m LA Vol (A4C):   27.2 ml 17.74 ml/m LA Biplane Vol: 31.1 ml 20.29 ml/m  AORTIC VALVE AV Area (Vmax):    1.59 cm AV Area (Vmean):   1.42 cm AV Area (VTI):     1.23 cm AV Vmax:           135.00 cm/s AV Vmean:          97.300 cm/s AV VTI:            0.331 m AV Peak Grad:      7.3 mmHg AV Mean Grad:      4.0 mmHg LVOT Vmax:         94.60 cm/s LVOT Vmean:        60.700 cm/s LVOT VTI:          0.179 m LVOT/AV VTI ratio: 0.54  AORTA Ao Root diam: 3.10 cm Ao Asc diam:  2.50 cm MITRAL VALVE MV Area (PHT): 4.06 cm    SHUNTS MV Decel Time: 187 msec    Systemic VTI:  0.18 m MV E velocity: 85.70 cm/s  Systemic Diam: 1.70 cm MV A velocity: 85.70 cm/s MV E/A ratio:  1.00 Laurance Flatten MD Electronically signed by Laurance Flatten MD Signature Date/Time: 01/28/2023/3:48:06 PM    Final    MR BRAIN WO CONTRAST  Result Date: 01/27/2023 CLINICAL DATA:  Stroke, follow-up EXAM: MRI HEAD WITHOUT CONTRAST TECHNIQUE: Multiplanar, multiecho pulse sequences  of the brain and surrounding structures were obtained without intravenous contrast. COMPARISON:  No prior MRI available, correlation is made with CT head 01/27/2023 FINDINGS: Brain: Scattered foci of restricted diffusion with ADC correlate throughout the right cerebral hemisphere, with more focal  and contiguous areas of restricted diffusion in the right frontal and parietal cortex (series 5, image 89), watershed territory (series 5, image 85), right caudate and lentiform nucleus (series 5, image 78), and right occipital lobe (series 5, image 68-70). No acute hemorrhage, mass, mass effect, or midline shift. No hydrocephalus or extra-axial collection. No hemosiderin deposition to suggest remote hemorrhage. Scattered and confluent T2 hyperintense signal in the periventricular white matter and pons, likely the sequela of moderate to severe chronic small vessel ischemic disease. Normal cerebral volume for age. Vascular: Normal arterial flow voids. Skull and upper cervical spine: Normal marrow signal. Sinuses/Orbits: Mucosal thickening in the left ethmoid air cells. No acute finding in the orbits. Other: The mastoids are well aerated. IMPRESSION: Scattered acute infarcts throughout the right cerebral hemisphere, with more focal areas of infarction in the right frontal and parietal cortex, watershed territory, right caudate and lentiform nucleus, and right occipital lobe. These results will be called to the ordering clinician or representative by the Radiologist Assistant, and communication documented in the PACS or [employer]. Electronically Signed   By: Wiliam Ke M.D.   On: 01/27/2023 23:45   CT HEAD CODE STROKE WO CONTRAST  Result Date: 01/27/2023 CLINICAL DATA:  Code stroke.  Left-sided weakness, right-sided gaze. EXAM: CT ANGIOGRAPHY HEAD AND NECK TECHNIQUE: Multidetector CT imaging of the head and neck was performed using the standard protocol during bolus administration of intravenous contrast. Multiplanar CT image reconstructions and MIPs were obtained to evaluate the vascular anatomy. Carotid stenosis measurements (when applicable) are obtained utilizing NASCET criteria, using the distal internal carotid diameter as the denominator. RADIATION DOSE REDUCTION: This exam was performed according  to the departmental dose-optimization program which includes automated exposure control, adjustment of the mA and/or kV according to patient size and/or use of iterative reconstruction technique. CONTRAST:  75 cc Omnipaque 350 COMPARISON:  CT head 09/29/2021 FINDINGS: CT HEAD FINDINGS Brain: There is no evidence of acute intracranial hemorrhage, extra-axial fluid collection, or acute territorial infarct. Parenchymal volume is normal. The ventricles are normal in size. Gray-white differentiation is preserved. There is patchy hypodensity in the supratentorial white matter likely reflecting sequela of advanced chronic small-vessel ischemic change. Pituitary and suprasellar region are normal. There is no mass lesion there is no mass effect or midline shift. Vascular: No hyperdense vessel is seen. Skull: Normal. Negative for fracture or focal lesion. Sinuses/Orbits: The paranasal sinuses are clear. The globes and orbits are unremarkable. Other: None. ASPECTS [facility] Stroke Program Early CT Score) - Ganglionic level infarction (caudate, lentiform nuclei, internal capsule, insula, M1-M3 cortex): 7 - Supraganglionic infarction (M4-M6 cortex): 3 Total score (0-10 with 10 being normal): 10 CTA NECK FINDINGS Aortic arch: There is mild calcified plaque in the imaged aortic arch. The origins of the major branch vessels are patent. The subclavian arteries are patent to the level imaged. Right carotid system: The right common carotid artery is patent. There is mixed plaque of the bifurcation with noncalcified plaque projecting into the lumen resulting in hemodynamically significant stenosis just after the bifurcation, estimated at 60%. Distally, there is additional mural adherent clot in the lumen of the ICA resulting in high-grade stenosis, estimated at 80%. There is no raised dissection flap. The distal internal carotid artery is  patent. The external carotid artery is patent. There is no evidence of aneurysm/pseudoaneurysm.  Left carotid system: The left common carotid artery is patent. There is calcified plaque at the bifurcation without hemodynamically significant stenosis or occlusion. The internal and external carotid arteries are patent. There is no evidence of dissection or aneurysm/pseudoaneurysm. Vertebral arteries: The vertebral arteries are patent, without hemodynamically significant stenosis or occlusion. There is no evidence of dissection or aneurysm/pseudoaneurysm. Skeleton: There is no acute osseous abnormality or suspicious osseous lesion. There is no visible canal hematoma. Other neck: There is extensive dental disease. The soft tissues of the neck are unremarkable. Upper chest: There is emphysema in the lung apices. Review of the MIP images confirms the above findings CTA HEAD FINDINGS Anterior circulation: There is calcified plaque in the intracranial ICAs without significant stenosis or occlusion. The right M1 segment is patent. The branch vessels are patent, without proximal stenosis or occlusion. The left MCA is patent, without proximal stenosis or occlusion. The bilateral ACAs are patent, without proximal stenosis or occlusion. There is no aneurysm or AVM. Posterior circulation: The bilateral V4 segments are patent. The basilar artery is patent. The major cerebellar arteries appear patent. The bilateral PCAs are patent, without proximal stenosis or occlusion. There is a fetal origin of the right PCA. A left posterior communicating artery is also identified. There is no aneurysm or AVM. Venous sinuses: Patent. Anatomic variants: As above. Review of the MIP images confirms the above findings IMPRESSION: 1. No acute intracranial pathology.  No evidence of evolved infarct. 2. Mural adherent clot in the right internal carotid artery after the bifurcation resulting in high-grade stenosis, estimated at 80%. 3. More proximally in the right internal carotid artery just after the bifurcation, mixed plaque results in  proximally 60% stenosis. 4. Mild plaque at the left carotid bifurcation without significant stenosis. Patent vertebral arteries in the neck. 5. Patent intracranial vasculature. 6. Emphysema. Findings of the noncontrast head CT were communicated to Dr Roda Shutters at 11:32 am. The the CTA results were discussed at 11:43 am. Electronically Signed   By: Lesia Hausen M.D.   On: 01/27/2023 11:57   CT ANGIO HEAD NECK W WO CM (CODE STROKE)  Result Date: 01/27/2023 CLINICAL DATA:  Code stroke.  Left-sided weakness, right-sided gaze. EXAM: CT ANGIOGRAPHY HEAD AND NECK TECHNIQUE: Multidetector CT imaging of the head and neck was performed using the standard protocol during bolus administration of intravenous contrast. Multiplanar CT image reconstructions and MIPs were obtained to evaluate the vascular anatomy. Carotid stenosis measurements (when applicable) are obtained utilizing NASCET criteria, using the distal internal carotid diameter as the denominator. RADIATION DOSE REDUCTION: This exam was performed according to the departmental dose-optimization program which includes automated exposure control, adjustment of the mA and/or kV according to patient size and/or use of iterative reconstruction technique. CONTRAST:  75 cc Omnipaque 350 COMPARISON:  CT head 09/29/2021 FINDINGS: CT HEAD FINDINGS Brain: There is no evidence of acute intracranial hemorrhage, extra-axial fluid collection, or acute territorial infarct. Parenchymal volume is normal. The ventricles are normal in size. Gray-white differentiation is preserved. There is patchy hypodensity in the supratentorial white matter likely reflecting sequela of advanced chronic small-vessel ischemic change. Pituitary and suprasellar region are normal. There is no mass lesion there is no mass effect or midline shift. Vascular: No hyperdense vessel is seen. Skull: Normal. Negative for fracture or focal lesion. Sinuses/Orbits: The paranasal sinuses are clear. The globes and orbits are  unremarkable. Other: None. ASPECTS [facility] Stroke Program Early CT  Score) - Ganglionic level infarction (caudate, lentiform nuclei, internal capsule, insula, M1-M3 cortex): 7 - Supraganglionic infarction (M4-M6 cortex): 3 Total score (0-10 with 10 being normal): 10 CTA NECK FINDINGS Aortic arch: There is mild calcified plaque in the imaged aortic arch. The origins of the major branch vessels are patent. The subclavian arteries are patent to the level imaged. Right carotid system: The right common carotid artery is patent. There is mixed plaque of the bifurcation with noncalcified plaque projecting into the lumen resulting in hemodynamically significant stenosis just after the bifurcation, estimated at 60%. Distally, there is additional mural adherent clot in the lumen of the ICA resulting in high-grade stenosis, estimated at 80%. There is no raised dissection flap. The distal internal carotid artery is patent. The external carotid artery is patent. There is no evidence of aneurysm/pseudoaneurysm. Left carotid system: The left common carotid artery is patent. There is calcified plaque at the bifurcation without hemodynamically significant stenosis or occlusion. The internal and external carotid arteries are patent. There is no evidence of dissection or aneurysm/pseudoaneurysm. Vertebral arteries: The vertebral arteries are patent, without hemodynamically significant stenosis or occlusion. There is no evidence of dissection or aneurysm/pseudoaneurysm. Skeleton: There is no acute osseous abnormality or suspicious osseous lesion. There is no visible canal hematoma. Other neck: There is extensive dental disease. The soft tissues of the neck are unremarkable. Upper chest: There is emphysema in the lung apices. Review of the MIP images confirms the above findings CTA HEAD FINDINGS Anterior circulation: There is calcified plaque in the intracranial ICAs without significant stenosis or occlusion. The right M1 segment is  patent. The branch vessels are patent, without proximal stenosis or occlusion. The left MCA is patent, without proximal stenosis or occlusion. The bilateral ACAs are patent, without proximal stenosis or occlusion. There is no aneurysm or AVM. Posterior circulation: The bilateral V4 segments are patent. The basilar artery is patent. The major cerebellar arteries appear patent. The bilateral PCAs are patent, without proximal stenosis or occlusion. There is a fetal origin of the right PCA. A left posterior communicating artery is also identified. There is no aneurysm or AVM. Venous sinuses: Patent. Anatomic variants: As above. Review of the MIP images confirms the above findings IMPRESSION: 1. No acute intracranial pathology.  No evidence of evolved infarct. 2. Mural adherent clot in the right internal carotid artery after the bifurcation resulting in high-grade stenosis, estimated at 80%. 3. More proximally in the right internal carotid artery just after the bifurcation, mixed plaque results in proximally 60% stenosis. 4. Mild plaque at the left carotid bifurcation without significant stenosis. Patent vertebral arteries in the neck. 5. Patent intracranial vasculature. 6. Emphysema. Findings of the noncontrast head CT were communicated to Dr Roda Shutters at 11:32 am. The the CTA results were discussed at 11:43 am. Electronically Signed   By: Lesia Hausen M.D.   On: 01/27/2023 11:57    Labs:  Basic Metabolic Panel: Recent Labs  Lab 02/03/23 0743  NA 138  K 3.7  CL 103  CO2 26  GLUCOSE 159*  BUN 13  CREATININE 0.82  CALCIUM 9.0    CBC: Recent Labs  Lab 02/03/23 0743  WBC 4.5  NEUTROABS 1.7  HGB 12.3  HCT 35.7*  MCV 88.1  PLT 256    CBG: Recent Labs  Lab 02/04/23 1143 02/04/23 1206 02/04/23 1619 02/04/23 2050 02/05/23 1313  GLUCAP 44* 125* 97 107* 111*   Family history.  Mother with diabetes CVA and hypertension.  Denies any colon cancer  esophageal cancer or rectal cancer  Brief HPI:    Mary [name] is a 72 y.o. right-handed female with history of PAD she had been on Coumadin per vascular surgery in [facility] 2021, tobacco use, hypertension hyperlipidemia.  Per chart review lives with spouse independent prior to admission.  Presented 01/27/2023 with acute onset of left-sided weakness right forced gaze and left neglect.  Admission chemistries unremarkable except potassium 3.0.  Cranial CT of the head negative but CTA demonstrated right carotid mural thrombus with high-grade stenosis.  Echocardiogram with ejection fraction of 55 to 60% no wall motion abnormalities grade 1 diastolic dysfunction.  Patient not a candidate for TNK because of Coumadin with INR 2.4 on admission.  Underwent right ICA thrombectomy per interventional radiology.  MRI performed demonstrated scattered infarcts throughout the right cerebral hemisphere with a focus in the right frontal and parietal cortex, watershed territory right caudate and lentiform as well as right occipital lobe.  Neurology follow-up placed on low-dose aspirin and Plavix for CVA prophylaxis.  There was no plan to resume chronic Coumadin therapy and discussed with vascular surgery.  Initially on Cleviprex for blood pressure control.  Therapy evaluations completed due to patient decreased functional mobility was admitted for a comprehensive rehab program.   Hospital Course: Mary [name] was admitted to rehab 02/02/2023 for inpatient therapies to consist of PT, ST and OT at least three hours five days a week. Past admission physiatrist, therapy team and rehab RN have worked together to provide customized collaborative inpatient rehab.  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  She will continue aspirin and Plavix therapy plan for ICA stent 03/01/2023 per interventional radiology.  Lipitor ongoing for hyperlipidemia.  She did have history of tobacco use maintained  on NicoDerm patch receiving counts regards to cessation of nicotine products.  Prediabetic hemoglobin A1c 6.2 blood sugars controlled.  History of PAD patient no longer to be on Coumadin at this time remain on aspirin and Plavix therapy.   Blood pressures were monitored on TID basis and controlled  Diabetes has been monitored with ac/hs CBG checks and SSI was use prn for tighter BS control.    Rehab course: During patient's stay in rehab weekly team conferences were held to monitor patient's progress, set goals and discuss barriers to discharge. At admission, patient required minimal assist 120 feet rolling walker moderate assist stand pivot transfers  Physical exam.  Blood pressure 125/72 pulse 69 temperature 98.3 respirations 16 oxygen saturation is 99% room air Constitutional.  No acute distress HEENT Head.  Normocephalic and atraumatic Eyes.  Pupils round and reactive to light no discharge without nystagmus Neck.  Supple nontender no JVD without thyromegaly Cardiac regular rate and rhythm without any extra sounds or murmur heard Abdomen.  Soft nontender positive bowel sounds without rebound Respiratory effort normal no respiratory distress without wheeze Musculoskeletal Comments.  Right upper extremity/right lower extremity 5/5 in biceps triceps wrist extension grip and FA as well as HF KE KF DF and PF Left upper extremity 4+/5 and same muscles tested Neurologic.  Alert oriented follows commands she does have a right gaze preference.  He/She  has had improvement in activity tolerance, balance, postural control as well as ability to compensate for deficits. He/She has had improvement in functional use RUE/LUE  and RLE/LLE as well as improvement in awareness.  Ambulates 130 feet rolling walker contact-guard.  Performed further gait without assistive device close supervision.  Perform forward and backward gait with cues.  Gather his belongings for activities day living and homemaking.  Full  family teaching completed plan discharge to home       Disposition: Discharge to home    Diet: Regular  Special Instructions: No driving smoking or alcohol  Follow-up interventional radiology 03/01/2023 for ICA stenting  Medications at discharge. 1.  Tylenol as needed 2.  Aspirin 81 mg p.o. daily 3.  Lipitor 10 mg p.o. nightly 4.  Plavix 75 mg p.o. daily 5.  NicoDerm patch taper as directed 6.Vitamin D 2000  units daily 7.Vitamin E 400 unit daily  30-35 minutes were spent completing discharge summary and discharge planning  Discharge Instructions     Ambulatory referral to Neurology   Complete by: As directed    An appointment is requested in approximately: 4 weeks right MCA infarction        Follow-up Information     Kirsteins, Victorino Sparrow, MD Follow up.   Specialty: Physical Medicine and Rehabilitation Why: Office to call for appointment Contact information: [address] [place] [address] [phone]         Julieanne Cotton, MD Follow up.   Specialties: Interventional Radiology, Radiology Why: appointment to be made for cerebral intervention 7/1 2024 Contact information: [address] [place] [address] [phone]                 Signed: Charlton Amor 02/05/2023, 1:46 PM

## 2023-02-05 NOTE — Progress Notes (Signed)
PROGRESS NOTE   Subjective/Complaints:  Had BM last noc, no new issues discussed date of stent placement   ROS- neg CP SOB N/V/D  Objective:   No results found. Recent Labs    02/03/23 0743  WBC 4.5  HGB 12.3  HCT 35.7*  PLT 256    Recent Labs    02/03/23 0743  NA 138  K 3.7  CL 103  CO2 26  GLUCOSE 159*  BUN 13  CREATININE 0.82  CALCIUM 9.0     Intake/Output Summary (Last 24 hours) at 02/05/2023 0747 Last data filed at 02/05/2023 0720 Gross per 24 hour  Intake 720 ml  Output --  Net 720 ml         Physical Exam: Vital Signs Blood pressure (!) 142/77, pulse 80, temperature 98 F (36.7 C), temperature source Oral, resp. rate 17, height 5\' 4"  (1.626 m), weight 49.8 kg, SpO2 100 %.   General: No acute distress Mood and affect are appropriate Heart: Regular rate and rhythm no rubs murmurs or extra sounds Lungs: Clear to auscultation, breathing unlabored, no rales or wheezes Abdomen: Positive bowel sounds, soft nontender to palpation, nondistended Extremities: No clubbing, cyanosis, or edema Skin: No evidence of breakdown, no evidence of rash Neurologic: Cranial nerves II through XII intact, motor strength is 5/5 in bilateral deltoid, bicep, tricep, grip, hip flexor, knee extensors, ankle dorsiflexor and plantar flexor Sensory exam normal sensation to light touch and proprioception in bilateral upper and lower extremities  Musculoskeletal: Full range of motion in all 4 extremities. No joint swelling   Assessment/Plan: 1. Functional deficits which require 3+ hours per day of interdisciplinary therapy in a comprehensive inpatient rehab setting. Physiatrist is providing close team supervision and 24 hour management of active medical problems listed below. Physiatrist and rehab team continue to assess barriers to discharge/monitor patient progress toward functional and medical goals  Care  Tool:  Bathing              Bathing assist Assist Level: Supervision/Verbal cueing     Upper Body Dressing/Undressing Upper body dressing        Upper body assist Assist Level: Supervision/Verbal cueing    Lower Body Dressing/Undressing Lower body dressing            Lower body assist Assist for lower body dressing: Supervision/Verbal cueing     Toileting Toileting    Toileting assist Assist for toileting: Supervision/Verbal cueing     Transfers Chair/bed transfer  Transfers assist     Chair/bed transfer assist level: Minimal Assistance - Patient > 75%     Locomotion Ambulation   Ambulation assist      Assist level: Contact Guard/Touching assist Assistive device: No Device Max distance: 150   Walk 10 feet activity   Assist     Assist level: Contact Guard/Touching assist Assistive device: Walker-rolling   Walk 50 feet activity   Assist    Assist level: Contact Guard/Touching assist Assistive device: Walker-rolling    Walk 150 feet activity   Assist    Assist level: Contact Guard/Touching assist Assistive device: No Device    Walk 10 feet on uneven surface  activity   Assist  Assist level: Minimal Assistance - Patient > 75% Assistive device: Other (comment) (no device although reaches for external support if available.)   Wheelchair     Assist Is the patient using a wheelchair?: No Type of Wheelchair: Manual Wheelchair activity did not occur: N/A         Wheelchair 50 feet with 2 turns activity    Assist    Wheelchair 50 feet with 2 turns activity did not occur: N/A       Wheelchair 150 feet activity     Assist  Wheelchair 150 feet activity did not occur: N/A       Blood pressure (!) 142/77, pulse 80, temperature 98 F (36.7 C), temperature source Oral, resp. rate 17, height 5\' 4"  (1.626 m), weight 49.8 kg, SpO2 100 %.  Medical Problem List and Plan: 1. Functional deficits secondary to  right MCA and MCA/ACA watershed zone infarct d/t emboli from right ICA intramural thrombus.  Status post thrombectomy. WHile R carotid throbus was removed there is underlying carotid stenosis, with planned stent placement ~2wks              -patient may  shower             -ELOS/Goals: 7d days- supervision- ICA stent scheduled with Dr Corliss Skains  for 7/1 stay on ASA and Plavix until then  2.  Antithrombotics: -DVT/anticoagulation:  Mechanical:  Antiembolism stockings, knee (TED hose) Bilateral lower extremities UE doppler done neg for clot             -antiplatelet therapy: Aspirin 81 mg daily and Plavix 75 mg daily 3. Pain Management: Tylenol as needed 4. Mood/Behavior/Sleep: Provide emotional support             -antipsychotic agents: N/A 5. Neuropsych/cognition: This patient is capable of making decisions on her own behalf. 6. Skin/Wound Care: Routine skin checks 7. Fluids/Electrolytes/Nutrition: Routine in and outs with follow-up chemistries 8.  Tobacco abuse.  NicoDerm patch.  Provide counseling 9.  Hyperlipidemia.  Lipitor 10.  PAD.  Patient started on Coumadin many years ago by vascular surgery in [facility] for peripheral arterial disease.  She had not seen vascular surgery for 3 years.  Discussed at length with vascular surgery and chronic Coumadin has been discontinued no plan to resume. 11. Mild constipation- LBM 2 days ago- change senna S to 1 po BID- had BM 6/6 12. Prediabetes- A1c 6.2- monitor with BMP's and might need additional monitoring.     LOS: 3 days A FACE TO FACE EVALUATION WAS PERFORMED  Erick Colace 02/05/2023, 7:47 AM

## 2023-02-05 NOTE — Progress Notes (Signed)
Physical Therapy Session Note  Patient Details  Name: Mary [name] MRN: [id number] Date of Birth: 1950-09-06  Today's Date: 02/05/2023 PT Individual Time: 0848 - 0948 PT Individual Time Calculation (min): 60 min  PT Individual Time: 0865-7846 PT Individual Time Calculation (min): 28 min   Short Term Goals: Week 1:  PT Short Term Goal 1 (Week 1): STG's=LTG's  SESSION 1 Skilled Therapeutic Interventions/Progress Updates: Patient supine in bed on entrance to room. Patient alert and agreeable to PT session.   Patient reported no pain and endorses getting adequate sleep the previous night.  Therapeutic Activity: Bed Mobility: Pt performed supine<>sit on EOB with supervision for safety with HOB elevated. Transfers: Pt performed sit<>stand transfers throughout session with supervision for safety.   Gait Training:  Pt ambulated room<>main gym (150') using no AD with CGA at first for safety, then supervision. Patient presented with decreased B step clearance/length and decreased cadence, decreased reciprocal arm swing with cues to increase arm swing.  - Patient ambulated in hallway in front of main hallway with cues to turn head in various directions, sudden stops, and pivot turns while matching PTA's cadence (in order to increase patient's gait speed and dynamic balance). Patient also performed lateral stepping to R and L.   Neuromuscular Re-ed: NMR facilitated during session with focus on challenging balance and neuromuscular control/coordination during dynamic activities. - Airex pad static stance with narrow BOS and eyes closed x 1 min - Dynamic standing while tossing blue heavy-med ball on trampoline with cues to catch and toss ball in D1/D2 flexion/extension pattern (patient required increased effort to obtain sequence). - Ambulating with 6lb dumbbell in L hand to promote reciprocal arm swing per L arm swing neglect. Patient had trouble with performing cue to swing L UE with contralateral  LE - moved to B 6lb on UE's with no cues for patient to swing - patient with reciprocal swing on R UE, but minimal on L UE.  NMR performed for improvements in motor control and coordination, balance, sequencing, judgement, and self confidence/ efficacy in performing all aspects of mobility at highest level of independence.   Patient left EOB at end of session with brakes locked, bed alarm set, and all needs within reach.  SESSION 2 Skilled Therapeutic Interventions/Progress Updates: Patient EOB on entrance to room. Patient alert and agreeable to PT session.   Patient reported no change in status since previous session.  Therapeutic Activity: Bed Mobility: Pt performed supine to sit on EOB with supervision/mod I. No VC required.  Transfers: Pt performed sit<>stand transfers throughout session with supervision/mod I for safety.   Gait Training:  Pt ambulated from room to main gym/ortho gym/ down to central hallway and back to room using no AD with supervision for safety. Pt presented with decreased reciprocal arm swing, decreased step clearance/length/cadence. Patient cued to increase reciprocal arm swing (R>L). Patient with increased effort to perform swing and with tendency to keep L UE in elbow flexion.   Neuromuscular Re-ed: - PTA use of 2 shuffle board sticks with patient holding onto them as well (PTA behind). Patient cued to relax B UE and to let PTA follow reciprocal arm swing sequence in order to carry over during ambulation. Patient required max cues to relax B UE (L>R). Patient noted to have some carry over when ambulating back to room without shuffle sticks.   Patient sitting EOB at end of session with brakes locked, bed alarm set, and all needs within reach.  Therapy Documentation Precautions:  Precautions Precautions: Fall Precaution Comments: L inattention Restrictions Weight Bearing Restrictions: No  Therapy/Group: Individual Therapy  Kloie Whiting  PTA 02/05/2023, 3:25 PM

## 2023-02-05 NOTE — IPOC Note (Signed)
Overall Plan of Care [facility]) Patient Details Name: Mary [name] MRN: [id number] DOB: 07-Nov-1950  Admitting Diagnosis: Right middle cerebral artery stroke [facility])  Hospital Problems: Principal Problem:   Right middle cerebral artery stroke [facility])     Functional Problem List: Nursing Bowel, Safety, Endurance, Medication Management  PT Balance, Endurance, Motor, Perception, Safety, Sensory  OT Balance, Cognition, Endurance, Motor, Perception, Safety  SLP Cognition, Perception, Safety  TR         Basic ADL's: OT Grooming, Bathing, Dressing, Toileting     Advanced  ADL's: OT Simple Meal Preparation, Laundry, Light Housekeeping     Transfers: PT Bed Mobility, Bed to Chair, Customer service manager, Tub/Shower     Locomotion: PT Ambulation, Psychologist, prison and probation services, Stairs     Additional Impairments: OT Fuctional Use of Upper Extremity  SLP Social Cognition   Problem Solving, Memory, Attention, Awareness  TR      Anticipated Outcomes Item Anticipated Outcome  Self Feeding    Swallowing      Basic self-care  setup-supervision  Toileting  setup   Bathroom Transfers setup  Bowel/Bladder  manage bowel w mod I assist  Transfers  Mod I  Locomotion  Mod I  Communication     Cognition  Supervision  Pain  n/a  Safety/Judgment  manage w cues   Therapy Plan: PT Intensity: Minimum of 1-2 x/day ,45 to 90 minutes PT Frequency: 5 out of 7 days PT Duration Estimated Length of Stay: 7-10 days OT Intensity: Minimum of 1-2 x/day, 45 to 90 minutes OT Frequency: 5 out of 7 days OT Duration/Estimated Length of Stay: 7-10 days SLP Intensity: Minumum of 1-2 x/day, 30 to 90 minutes SLP Frequency: 1 to 3 out of 7 days SLP Duration/Estimated Length of Stay: 11-15 days   Team Interventions: Nursing Interventions Medication Management, Discharge Planning, Bowel Management, Disease Management/Prevention, Patient/Family Education  PT interventions Ambulation/gait training, Designer, jewellery, Cognitive remediation/compensation, Community reintegration, Discharge planning, Disease management/prevention, DME/adaptive equipment instruction, Functional mobility training, Functional electrical stimulation, Neuromuscular re-education, Patient/family education, Pain management, Stair training, Therapeutic Activities, Therapeutic Exercise, UE/LE Strength taining/ROM, UE/LE Coordination activities, Visual/perceptual remediation/compensation, Wheelchair propulsion/positioning, Psychosocial support  OT Interventions Warden/ranger, Disease mangement/prevention, Neuromuscular re-education, Self Care/advanced ADL retraining, Therapeutic Exercise, Wheelchair propulsion/positioning, Cognitive remediation/compensation, DME/adaptive equipment instruction, Pain management, UE/LE Strength taining/ROM, Community reintegration, Development worker, international aid stimulation, Patient/family education, UE/LE Coordination activities, Discharge planning, Functional mobility training, Psychosocial support, Therapeutic Activities, Visual/perceptual remediation/compensation, Splinting/orthotics  SLP Interventions Cognitive remediation/compensation, Cueing hierarchy, Functional tasks, Therapeutic Activities  TR Interventions    SW/CM Interventions Discharge Planning, Psychosocial Support, Patient/Family Education, Disease Management/Prevention   Barriers to Discharge MD  Medical stability and Pending surgery  Nursing Decreased caregiver support, Home environment access/layout 1 level 3 ste bil rails w spouse  PT Home environment access/layout    OT      SLP      SW Insurance for SNF coverage, Lack of/limited family support, Decreased caregiver support     Team Discharge Planning: Destination: PT-Home ,OT- Home , SLP-Home Projected Follow-up: PT-Outpatient PT, OT-  Home health OT, Outpatient OT, SLP-Home Health SLP, Outpatient SLP Projected Equipment Needs: PT-To be determined, OT- To be determined,  SLP-None recommended by SLP Equipment Details: PT-pt currently owns none, OT-  Patient/family involved in discharge planning: PT- Patient,  OT-Patient, SLP-Patient  MD ELOS: 7-10d Medical Rehab Prognosis:  Excellent Assessment: The patient has been admitted for CIR therapies with the diagnosis of R MCA stroke. The team will be addressing functional mobility,  strength, stamina, balance, safety, adaptive techniques and equipment, self-care, bowel and bladder mgt, patient and caregiver education, BP management . Goals have been set at [facility]. Anticipated discharge destination is home , outpt stent placement 03/01/23.        See Team Conference Notes for weekly updates to the plan of care

## 2023-02-05 NOTE — Progress Notes (Signed)
Occupational Therapy Session Note  Patient Details  Name: Mary [name] MRN: [id number] Date of Birth: 1951-05-09  Today's Date: 02/05/2023 OT Individual Time: 1000-1100 OT Individual Time Calculation (min): 60 min    Short Term Goals: Week 1:  OT Short Term Goal 1 (Week 1): STGs = LTGs due to ELOS  Skilled Therapeutic Interventions/Progress Updates:    Pt received in bed ready for therapy.  Focus of therapy session on safe balance and mobility with self care, L side awareness and coordination. -independent with bed mobiity -S walking to sink and standing at sink for several minutes to brush teeth -verbal cue when standing at sink to find toothpaste on L side of sink -pt retrieved her clothing from closet and carried it to her bed -ambulated to shower and doffed clothing sitting on bench without A.   -sat to shower except to stand to wash bottom with supervision (distant S as she sat to shower) -dried off and ambulated to bed to dress.  -no A needed with dressing -excellent use of L hand as a dominant A   Discussed with pt her fast progress and that I have upgraded  her goals.  Pt and I discussed potential discharge day and then informed team.    Pt resting in room with all needs met.   call light in reach.        Therapy Documentation Precautions:  Precautions Precautions: Fall Precaution Comments: L inattention Restrictions Weight Bearing Restrictions: No Pain: Pain Assessment Pain Scale: 0-10 Pain Score: 0-No pain ADL: ADL Grooming: Supervision/safety Where Assessed-Grooming: Standing at sink Upper Body Bathing: Supervision/safety Where Assessed-Upper Body Bathing: Standing at sink Lower Body Bathing: Supervision/safety Where Assessed-Lower Body Bathing: Standing at sink Upper Body Dressing: Supervision/safety Where Assessed-Upper Body Dressing: Sitting at sink Lower Body Dressing: Supervision/safety Where Assessed-Lower Body Dressing: Sitting at sink Toileting:  Supervision/safety Where Assessed-Toileting: Teacher, adult education: Close supervision Toilet Transfer Method: Proofreader: Engineer, technical sales: Not assessed Film/video editor: Not assessed  Therapy/Group: Individual Therapy  Mary [name] 02/05/2023, 10:29 AM

## 2023-02-05 NOTE — Plan of Care (Signed)
  Problem: RH Grooming Goal: LTG Patient will perform grooming w/assist,cues/equip (OT) Description: LTG: Patient will perform grooming with assist, with/without cues using equipment (OT) Flowsheets (Taken 02/05/2023 1037) LTG: Pt will perform grooming with assistance level of: (LTG upgraded due to fast progress with L side coordination and balance.) Independent Note: LTG upgraded due to fast progress with L side coordination and balance.    Problem: RH Bathing Goal: LTG Patient will bathe all body parts with assist levels (OT) Description: LTG: Patient will bathe all body parts with assist levels (OT) Flowsheets (Taken 02/05/2023 1037) LTG: Pt will perform bathing with assistance level/cueing: (LTG upgraded due to fast progress with L side coordination and balance.) Set up assist  LTG: Position pt will perform bathing: Shower Note: LTG upgraded due to fast progress with L side coordination and balance.    Problem: RH Dressing Goal: LTG Patient will perform upper body dressing (OT) Description: LTG Patient will perform upper body dressing with assist, with/without cues (OT). Flowsheets (Taken 02/05/2023 1037) LTG: Pt will perform upper body dressing with assistance level of: (LTG upgraded due to fast progress with L side coordination and balance.) Independent Note: LTG upgraded due to fast progress with L side coordination and balance.  Goal: LTG Patient will perform lower body dressing w/assist (OT) Description: LTG: Patient will perform lower body dressing with assist, with/without cues in positioning using equipment (OT) Flowsheets (Taken 02/05/2023 1037) LTG: Pt will perform lower body dressing with assistance level of: (LTG upgraded due to fast progress with L side coordination and balance.) Independent Note: LTG upgraded due to fast progress with L side coordination and balance.    Problem: RH Toileting Goal: LTG Patient will perform toileting task (3/3 steps) with assistance level  (OT) Description: LTG: Patient will perform toileting task (3/3 steps) with assistance level (OT)  Flowsheets (Taken 02/05/2023 1037) LTG: Pt will perform toileting task (3/3 steps) with assistance level: (LTG upgraded due to fast progress with L side coordination and balance.) Independent Note: LTG upgraded due to fast progress with L side coordination and balance.    Problem: RH Functional Use of Upper Extremity Goal: LTG Patient will use RT/LT upper extremity as a (OT) Description: LTG: Patient will use right/left upper extremity as a stabilizer/gross assist/diminished/nondominant/dominant level with assist, with/without cues during functional activity (OT) Flowsheets (Taken 02/05/2023 1037) LTG: Pt will use upper extremity in functional activity with assistance level of: (LTG upgraded due to fast progress with L side coordination and balance.) Independent Note: LTG upgraded due to fast progress with L side coordination and balance.    Problem: RH Toilet Transfers Goal: LTG Patient will perform toilet transfers w/assist (OT) Description: LTG: Patient will perform toilet transfers with assist, with/without cues using equipment (OT) Flowsheets (Taken 02/05/2023 1037) LTG: Pt will perform toilet transfers with assistance level of: (LTG upgraded due to fast progress with L side coordination and balance.) Independent Note: LTG upgraded due to fast progress with L side coordination and balance.

## 2023-02-05 NOTE — Progress Notes (Signed)
Physical Therapy Session Note  Patient Details  Name: Mary [name] MRN: [id number] Date of Birth: 29-Oct-1950  Today's Date: 02/05/2023 PT Individual Time: 1330-1357 PT Individual Time Calculation (min): 27 min   Short Term Goals: Week 1:  PT Short Term Goal 1 (Week 1): STG's=LTG's  Skilled Therapeutic Interventions/Progress Updates:   Received pt semi-reclined in bed, pt agreeable to PT treatment, and denied any pain during session. Session with emphasis on functional mobility/transfers, toileting, generalized strengthening and endurance, dynamic standing balance/coordination, and gait training. Pt transferred semi-reclined<>sitting EOB with HOB elevated and mod I and performed all transfers without AD and CGA fading to supervision throughout session. Pt ambulated in/out of bathroom without AD and CGA and able to manage clothing, void, and perform hygiene management without assist. Pt able to pick up items she dropped on floor without UE support and CGA. Stood at sink to perform hand hygiene, then ambulated >390ft x 2 trials without AD and CGA fading to supervision to/from dayroom. Pt performed standing BLE strengthening on Kinetron at 20 cm/sec for 1 minute x 2 trials and 2 minutes x 1 trial with BUE support and emphasis on glute/quad strength. Returned to room and transferred sit<>semi-reclined mod I. Concluded session with pt semi-reclined in bed, needs within reach, and bed alarm on.   Therapy Documentation Precautions:  Precautions Precautions: Fall Precaution Comments: L inattention Restrictions Weight Bearing Restrictions: No  Therapy/Group: Individual Therapy Marlana Salvage Zaunegger Blima Rich PT, DPT 02/05/2023, 12:29 PM

## 2023-02-05 NOTE — Progress Notes (Signed)
Speech Language Pathology Daily Session Note  Patient Details  Name: Mary [name] MRN: [id number] Date of Birth: [birth year]  Today's Date: 02/05/2023 SLP Individual Time: 1445-1525 SLP Individual Time Calculation (min): 40 min  Short Term Goals: Week 1: SLP Short Term Goal 1 (Week 1): Pt will complete functional problem solving tasks with >75% accuracy given min cues. SLP Short Term Goal 2 (Week 1): Pt will complete med management tasks with >75% accuracy given min cues. SLP Short Term Goal 3 (Week 1): Pt will complete functional memory tasks with >75% accuracy given use of strategies as needed. SLP Short Term Goal 4 (Week 1): Pt will complete moderate level attention tasks with >75% accuracy with cues to attend to L side as needed.  Skilled Therapeutic Interventions:   Pt was seen for skilled ST services targeting functional cognition. Pt was sitting in bed, watching tennis, upon SLP arrival. Pt was agreeable to ST intervention. Pt reported she felt 85% back to baseline. She did not endorse any cognitive difficulty. SLP reviewed results of SLUMS assessment and edu on how awareness of impairment can fluctuate post CVA. SLP  facilitated today's session using medication error awareness task. Pt required modA to complete task. When finished, pt reported she felt the task was "easy" for her. SLP explained she made several errors and pt nodded in agreement after SLP assisted in correction of those errors. SLP rec continued medication tasks, as pt reports this was easy for her prior to this hospital stay.  Pt was left in bed, safety measures activated, and all immediate needs within reach. Continue with current POC.   Pain Pain Assessment Pain Score: 0-No pain  Therapy/Group: Individual Therapy  Dorena Bodo 02/05/2023, 3:28 PM

## 2023-02-05 NOTE — Progress Notes (Signed)
Inpatient Rehabilitation  Patient information reviewed and entered into eRehab system by Sadiyah Kangas Denita Lun, OTR/L, Rehab Quality Coordinator.   Information including medical coding, functional ability and quality indicators will be reviewed and updated through discharge.   

## 2023-02-05 NOTE — Progress Notes (Addendum)
Patient ID: Mary [name], female   DOB: 07/13/1951, 72 y.o.   MRN: [id number]  Sw informed patient ready for d/c on 6/11. Sw discussed with patient and patient agreeable. Pt and spouse prefer [facility] services for follow up. Recommendation for shower chair, pt prefers to purchase privately.   [facility] referral sent to [facility].

## 2023-02-06 NOTE — Progress Notes (Signed)
Physical Therapy Session Note  Patient Details  Name: Mary [name] MRN: [id number] Date of Birth: 30-Jan-1951  Today's Date: 02/06/2023 PT Individual Time: 1106-1204 PT Individual Time Calculation (min): 58 min   Short Term Goals: Week 1:  PT Short Term Goal 1 (Week 1): STG's=LTG's  Skilled Therapeutic Interventions/Progress Updates: Patient supine in bed on entrance to room. Patient alert and agreeable to PT session.   Patient reported no pain.  Therapeutic Activity: Bed Mobility: Pt performed supine<>sit on EOB with modI. No VC required.  Transfers: Pt performed sit<>stand transfers throughout session with no AD. No VC required.   Gait Training:  Pt ambulated from room to main gym with no AD and supervision for safety. Patient occasionally touches side rail in hallway and endorses "furniture surfing" at home. Patient with notable reciprocal arm swing without PTA having to provide cues. Patient reported having to think about motion and remembered the cue from previous session.  Patient ambulated back to room from main gym with no AD and cues to match PTA's cadence (cues also towards end to rotate head R/L/up/down while maintaining cadence. Patient demonstrated slight sway in direction of cued head rotation. Notable increase in speed with cadence match than previous sessions.   Neuromuscular Re-ed: NMR facilitated during session with focus on dynamic balance with ambulation and coordination. - Time to Dots - Patient instructed to ambulate to colored dots called out by PTA in main gym in order to increase patient's cadence, and dynamic balance. Patient had 8 seconds to get to the next colored dot. Patient provided with cues to increase cadence with supervision provided for safety.  - Progressed to adding orange cones to each dot on floor with cues for patient to tap with L LE (harder for patient to coordinate movement) and 12 seconds to reach each station. Patient then cued to match PTA's  cadence to each cone in order to increase gait challenge and speed. - Obstacle course starting with small wave board (lateral stepping) -> airex beam (lateral stepping) -> hurdles (reciprocal anterior steps 6"). PTA provided CGA on belt and HHA on L UE at first round for safety. Patient then progressed to only CGA on gait belt. VC required for patient to keep L foot on airex beam per presentation of having half of it off.   NMR performed for improvements in motor control and coordination, balance, sequencing, judgement, and self confidence/ efficacy in performing all aspects of mobility at highest level of independence.   Patient sitting EOB at end of session with brakes locked, bed alarm set, and all needs within reach.      Therapy Documentation Precautions:  Precautions Precautions: Fall Precaution Comments: L inattention Restrictions Weight Bearing Restrictions: No  Therapy/Group: Individual Therapy  Savaya Hakes PTA 02/06/2023, 12:47 PM

## 2023-02-06 NOTE — Progress Notes (Signed)
Physical Therapy Session Note  Patient Details  Name: Mary [name] MRN: [id number] Date of Birth: 1951/07/22  Today's Date: 02/06/2023 PT Individual Time: 0800-0840 PT Individual Time Calculation (min): 40 min   Short Term Goals: Week 1:  PT Short Term Goal 1 (Week 1): STG's=LTG's  Skilled Therapeutic Interventions/Progress Updates:    Chart reviewed and pt agreeable to therapy. Pt received se,omoi-reclined in bed with no c/o pain. Session focused on dynamic balance, stair navigation, and activiity tolerance to promote safe home access. Pt initiated session with amb of 228ft to therapy gym using CGA + no AD. Pt then completed series of balance exercises including toe taps to single, central target and toe taps to multiple targets around feet. Pt completed exercise with MinA progressing to CGA with continued practice. Pt then navigated 12 steps with unilateral rail + S. Pt then competed NuStep for 10 mins for interval training at workloads 5-10. Pt noted to have good tolerance of higher resistance levels. Pt amb back to room and bed progressing to S + no AD. At end of session, pt was left semi-reclined in bed with alarm engaged, nurse call bell and all needs in reach.     Therapy Documentation Precautions:  Precautions Precautions: Fall Precaution Comments: L inattention Restrictions Weight Bearing Restrictions: No General:      Therapy/Group: Individual Therapy  Dionne Milo, PT, DPT 02/06/2023, 12:21 PM

## 2023-02-06 NOTE — Progress Notes (Signed)
Occupational Therapy Session Note  Patient Details  Name: Mary [name] MRN: [id number] Date of Birth: 1950-09-10  Today's Date: 02/06/2023 OT Individual Time: 1300-1400 OT Individual Time Calculation (min): 60 min    Short Term Goals: Week 1:  OT Short Term Goal 1 (Week 1): STGs = LTGs due to ELOS  Skilled Therapeutic Interventions/Progress Updates:    Pt received in bed ready for therapy.  Focus of therapy session on coordination and sequencing for more efficient ambulation when she is doing IADLS in the home, L side coordination, and I ADL tasks.  Pt ambulated to gym with supervision and no AD.  She tends to take small steps and is guarded in her motor pattern with decreased L arm swing and hip rotation. To increase fluidity, pt practiced walking pushing the grocery cart.  She initially walked 141 feet pushing cart in 1 min 50 seconds, 2nd trial in 65 seconds and 3rd trial in 55 seconds.  Pt became more confident with speed.  She also practiced walking with hula hoop around her to increase L arm swing. She continues to demonstrate some mild L inattention. Rechecked her visual fields, saccades and tracking and no visual deficits.   L arm coordination with holding hula hoop and rotating hoop like a steering wheel . She was doing fairly well but kept having L inattention and letting her hand slide down on hoop or letting left side of hoop drop.  She will need continued therapy in this area.    Pt ambulated to laundry room to practice reaching in washer and dryer without difficulty and then carried a laundry basket around room and was able to place on floor and back on table with no difficulty. Ambulated back to room with Supervision.   Used toilet with mod I.    Pt resting in bed with all needs met. Alarm set and call light in reach.    Therapy Documentation Precautions:  Precautions Precautions: Fall Precaution Comments: L inattention Restrictions Weight Bearing Restrictions: No Therapy  Vitals Temp: 97.7 F (36.5 C) Temp Source: Oral Pulse Rate: 71 Resp: 18 BP: 109/76 Patient Position (if appropriate): Sitting Oxygen Therapy SpO2: 100 % O2 Device: Room Air Pain:  No c/o pain  ADL: ADL Grooming: Supervision/safety Where Assessed-Grooming: Standing at sink Upper Body Bathing: Supervision/safety Where Assessed-Upper Body Bathing: Shower Lower Body Bathing: Supervision/safety Where Assessed-Lower Body Bathing: Shower Upper Body Dressing: Supervision/safety Where Assessed-Upper Body Dressing: Edge of bed Lower Body Dressing: Supervision/safety Where Assessed-Lower Body Dressing: Edge of bed Toileting: Supervision/safety Where Assessed-Toileting: Teacher, adult education: Close supervision Toilet Transfer Method: Proofreader: Chiropractor Transfer: Close supervison Web designer Method: Ship broker: Information systems manager with back Film/video editor: Close supervision Film/video editor Method: Designer, industrial/product: Shower seat with back  Therapy/Group: Individual Therapy  Nupur Hohman 02/06/2023, 4:27 PM

## 2023-02-06 NOTE — Progress Notes (Signed)
Speech Language Pathology Daily Session Note  Patient Details  Name: Mary [name] MRN: [id number] Date of Birth: 06-May-1951  Today's Date: 02/06/2023 SLP Individual Time: 0916-0958 SLP Individual Time Calculation (min): 42 min  Short Term Goals: Week 1: SLP Short Term Goal 1 (Week 1): Pt will complete functional problem solving tasks with >75% accuracy given min cues. SLP Short Term Goal 2 (Week 1): Pt will complete med management tasks with >75% accuracy given min cues. SLP Short Term Goal 3 (Week 1): Pt will complete functional memory tasks with >75% accuracy given use of strategies as needed. SLP Short Term Goal 4 (Week 1): Pt will complete moderate level attention tasks with >75% accuracy with cues to attend to L side as needed.  Skilled Therapeutic Interventions: Skilled treatment session focused on cognitive goals. SLP facilitated session by providing a list of her current medications. Patient able to recall the functions of her medications and organized a BID pill box with Min verbal cues to self-monitor and correct errors. Patient left upright in bed with alarm on and all needs within reach. Continue with current plan of care.      Pain No/Denies Pain   Therapy/Group: Individual Therapy  Mary [name] 02/06/2023, 1:40 PM

## 2023-02-06 NOTE — Progress Notes (Signed)
PROGRESS NOTE   Subjective/Complaints: No events overnight. Vitals stable. LBM 6/6  Attempted to see patient x 2; out of room, not in gym.  ROS-unable to obtain  Objective:   No results found. No results for input(s): "WBC", "HGB", "HCT", "PLT" in the last 72 hours.  No results for input(s): "NA", "K", "CL", "CO2", "GLUCOSE", "BUN", "CREATININE", "CALCIUM" in the last 72 hours.   Intake/Output Summary (Last 24 hours) at 02/06/2023 0850 Last data filed at 02/05/2023 1816 Gross per 24 hour  Intake 600 ml  Output --  Net 600 ml         Physical Exam: Vital Signs Blood pressure 136/66, pulse 69, temperature 99.1 F (37.3 C), temperature source Oral, resp. rate 18, height 5\' 4"  (1.626 m), weight 49.8 kg, SpO2 99 %.  From prior exams General: No acute distress Mood and affect are appropriate Heart: Regular rate and rhythm no rubs murmurs or extra sounds Lungs: Clear to auscultation, breathing unlabored, no rales or wheezes Abdomen: Positive bowel sounds, soft nontender to palpation, nondistended Extremities: No clubbing, cyanosis, or edema Skin: No evidence of breakdown, no evidence of rash Neurologic: Cranial nerves II through XII intact, motor strength is 5/5 in bilateral deltoid, bicep, tricep, grip, hip flexor, knee extensors, ankle dorsiflexor and plantar flexor Sensory exam normal sensation to light touch and proprioception in bilateral upper and lower extremities  Musculoskeletal: Full range of motion in all 4 extremities. No joint swelling   Assessment/Plan: 1. Functional deficits which require 3+ hours per day of interdisciplinary therapy in a comprehensive inpatient rehab setting. Physiatrist is providing close team supervision and 24 hour management of active medical problems listed below. Physiatrist and rehab team continue to assess barriers to discharge/monitor patient progress toward functional and  medical goals  Care Tool:  Bathing    Body parts bathed by patient: Right arm, Left arm, Chest, Abdomen, Front perineal area, Buttocks, Right upper leg, Left upper leg, Face, Left lower leg, Right lower leg         Bathing assist Assist Level: Supervision/Verbal cueing     Upper Body Dressing/Undressing Upper body dressing        Upper body assist Assist Level: Supervision/Verbal cueing    Lower Body Dressing/Undressing Lower body dressing            Lower body assist Assist for lower body dressing: Supervision/Verbal cueing     Toileting Toileting    Toileting assist Assist for toileting: Supervision/Verbal cueing     Transfers Chair/bed transfer  Transfers assist     Chair/bed transfer assist level: Supervision/Verbal cueing     Locomotion Ambulation   Ambulation assist      Assist level: Contact Guard/Touching assist Assistive device: No Device Max distance: 150   Walk 10 feet activity   Assist     Assist level: Contact Guard/Touching assist Assistive device: Walker-rolling   Walk 50 feet activity   Assist    Assist level: Contact Guard/Touching assist Assistive device: Walker-rolling    Walk 150 feet activity   Assist    Assist level: Contact Guard/Touching assist Assistive device: No Device    Walk 10 feet on uneven surface  activity  Assist     Assist level: Minimal Assistance - Patient > 75% Assistive device: Other (comment) (no device although reaches for external support if available.)   Wheelchair     Assist Is the patient using a wheelchair?: Yes Type of Wheelchair: Manual Wheelchair activity did not occur: N/A         Wheelchair 50 feet with 2 turns activity    Assist    Wheelchair 50 feet with 2 turns activity did not occur: N/A       Wheelchair 150 feet activity     Assist  Wheelchair 150 feet activity did not occur: N/A       Blood pressure 136/66, pulse 69, temperature  99.1 F (37.3 C), temperature source Oral, resp. rate 18, height 5\' 4"  (1.626 m), weight 49.8 kg, SpO2 99 %.  Medical Problem List and Plan: 1. 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  Status post thrombectomy. WHile R carotid throbus was removed there is underlying carotid stenosis, with planned stent placement ~2wks              -patient may  shower             -ELOS/Goals: 7d days- supervision- ICA stent scheduled with Dr Corliss Skains  for 7/1 stay on ASA and Plavix until then  2.  Antithrombotics: -DVT/anticoagulation:  Mechanical:  Antiembolism stockings, knee (TED hose) Bilateral lower extremities UE doppler done neg for clot             -antiplatelet therapy: Aspirin 81 mg daily and Plavix 75 mg daily 3. Pain Management: Tylenol as needed 4. Mood/Behavior/Sleep: Provide emotional support             -antipsychotic agents: N/A 5. Neuropsych/cognition: This patient is capable of making decisions on her own behalf. 6. Skin/Wound Care: Routine skin checks 7. Fluids/Electrolytes/Nutrition: Routine in and outs with follow-up chemistries 8.  Tobacco abuse.  NicoDerm patch.  Provide counseling 9.  Hyperlipidemia.  Lipitor 10.  PAD.  Patient started on Coumadin many years ago by vascular surgery in [facility] for peripheral arterial disease.  She had not seen vascular surgery for 3 years.  Discussed at length with vascular surgery and chronic Coumadin has been discontinued no plan to resume.  11. Mild constipation- LBM 2 days ago- change senna S to 1 po BID- had BM 6/6 12. Prediabetes- A1c 6.2- monitor with BMP's and might need additional monitoring.     LOS: 4 days A FACE TO FACE EVALUATION WAS PERFORMED  Angelina Sheriff 02/06/2023, 8:50 AM

## 2023-02-07 NOTE — Progress Notes (Signed)
PROGRESS NOTE   Subjective/Complaints: No events overnight. No acute complaints.  Patient doing very well today, only concern is regarding single instance of low blood sugar during her admission and questioning if she needs to take her blood sugar as an outpatient.  A1c 6.2, discussed typically no need to monitor at home at this level but will defer to PCP.  Vitals stable. LBM 6/6  ROS: Denies fevers, chills, N/V, abdominal pain, constipation, diarrhea, SOB, cough, chest pain, new weakness or paraesthesias.    Objective:   No results found. No results for input(s): "WBC", "HGB", "HCT", "PLT" in the last 72 hours.  No results for input(s): "NA", "K", "CL", "CO2", "GLUCOSE", "BUN", "CREATININE", "CALCIUM" in the last 72 hours.   Intake/Output Summary (Last 24 hours) at 02/07/2023 1013 Last data filed at 02/07/2023 0729 Gross per 24 hour  Intake 354 ml  Output --  Net 354 ml         Physical Exam: Vital Signs Blood pressure 121/68, pulse 77, temperature 99.1 F (37.3 C), temperature source Oral, resp. rate 18, height 5\' 4"  (1.626 m), weight 49.8 kg, SpO2 97 %.   General: No acute distress.  Ambulating from bathroom to sink with rolling walker, independent. Mood and affect are appropriate Heart: Regular rate and rhythm no rubs murmurs or extra sounds Lungs: Clear to auscultation, breathing unlabored, no rales or wheezes Abdomen: Positive bowel sounds, soft nontender to palpation, nondistended Extremities: No clubbing, cyanosis, or edema Skin: No evidence of breakdown, no evidence of rash Neurologic: Cranial nerves II through XII intact, motor strength is 5/5 in bilateral deltoid, bicep, tricep, grip, hip flexor, knee extensors, ankle dorsiflexor and plantar flexor Sensory exam normal sensation to light touch and proprioception in bilateral upper and lower extremities  Musculoskeletal: Full range of motion in all 4  extremities. No joint swelling.  Ambulating independently with writing walker, stable gait pattern, excellent coordination.   Assessment/Plan: 1. Functional deficits which require 3+ hours per day of interdisciplinary therapy in a comprehensive inpatient rehab setting. Physiatrist is providing close team supervision and 24 hour management of active medical problems listed below. Physiatrist and rehab team continue to assess barriers to discharge/monitor patient progress toward functional and medical goals  Care Tool:  Bathing    Body parts bathed by patient: Right arm, Left arm, Chest, Abdomen, Front perineal area, Buttocks, Right upper leg, Left upper leg, Face, Left lower leg, Right lower leg         Bathing assist Assist Level: Supervision/Verbal cueing     Upper Body Dressing/Undressing Upper body dressing        Upper body assist Assist Level: Supervision/Verbal cueing    Lower Body Dressing/Undressing Lower body dressing            Lower body assist Assist for lower body dressing: Supervision/Verbal cueing     Toileting Toileting    Toileting assist Assist for toileting: Supervision/Verbal cueing     Transfers Chair/bed transfer  Transfers assist     Chair/bed transfer assist level: Supervision/Verbal cueing     Locomotion Ambulation   Ambulation assist      Assist level: Contact Guard/Touching assist Assistive device: No Device  Max distance: 150   Walk 10 feet activity   Assist     Assist level: Contact Guard/Touching assist Assistive device: Walker-rolling   Walk 50 feet activity   Assist    Assist level: Contact Guard/Touching assist Assistive device: Walker-rolling    Walk 150 feet activity   Assist    Assist level: Contact Guard/Touching assist Assistive device: No Device    Walk 10 feet on uneven surface  activity   Assist     Assist level: Minimal Assistance - Patient > 75% Assistive device: Other (comment)  (no device although reaches for external support if available.)   Wheelchair     Assist Is the patient using a wheelchair?: Yes Type of Wheelchair: Manual Wheelchair activity did not occur: N/A         Wheelchair 50 feet with 2 turns activity    Assist    Wheelchair 50 feet with 2 turns activity did not occur: N/A       Wheelchair 150 feet activity     Assist  Wheelchair 150 feet activity did not occur: N/A       Blood pressure 121/68, pulse 77, temperature 99.1 F (37.3 C), temperature source Oral, resp. rate 18, height 5\' 4"  (1.626 m), weight 49.8 kg, SpO2 97 %.  Medical Problem List and Plan: 1. 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  Status post thrombectomy. WHile R carotid throbus was removed there is underlying carotid stenosis, with planned stent placement ~2wks              -patient may  shower             -ELOS/Goals: 7d days- supervision- ICA stent scheduled with Dr Corliss Skains  for 7/1 stay on ASA and Plavix until then  2.  Antithrombotics: -DVT/anticoagulation:  Mechanical:  Antiembolism stockings, knee (TED hose) Bilateral lower extremities UE doppler done neg for clot             -antiplatelet therapy: Aspirin 81 mg daily and Plavix 75 mg daily 3. Pain Management: Tylenol as needed 4. Mood/Behavior/Sleep: Provide emotional support             -antipsychotic agents: N/A 5. Neuropsych/cognition: This patient is capable of making decisions on her own behalf. 6. Skin/Wound Care: Routine skin checks 7. Fluids/Electrolytes/Nutrition: Routine in and outs with follow-up chemistries 8.  Tobacco abuse.  NicoDerm patch.  Provide counseling 9.  Hyperlipidemia.  Lipitor 10.  PAD.  Patient started on Coumadin many years ago by vascular surgery in [facility] for peripheral arterial disease.  She had not seen vascular surgery for 3 years.  Discussed at length with  vascular surgery and chronic Coumadin has been discontinued no plan to resume.  11. Mild constipation- LBM 2 days ago- change senna S to 1 po BID- had BM 6/6 12. Prediabetes- A1c 6.2- monitor with BMP's and might need additional monitoring.     LOS: 5 days A FACE TO FACE EVALUATION WAS PERFORMED  Angelina Sheriff 02/07/2023, 10:13 AM

## 2023-02-08 ENCOUNTER — Other Ambulatory Visit (HOSPITAL_COMMUNITY): Payer: Self-pay

## 2023-02-08 MED ORDER — CLOPIDOGREL BISULFATE 75 MG PO TABS
75.0000 mg | ORAL_TABLET | Freq: Every day | ORAL | 1 refills | Status: DC
  Filled 2023-02-08: qty 30, 30d supply, fill #0

## 2023-02-08 MED ORDER — VITAMIN D 25 MCG (1000 UNIT) PO TABS
2000.0000 [IU] | ORAL_TABLET | Freq: Every day | ORAL | 0 refills | Status: DC
  Filled 2023-02-08: qty 30, 15d supply, fill #0

## 2023-02-08 MED ORDER — AMLODIPINE BESYLATE 5 MG PO TABS
5.0000 mg | ORAL_TABLET | Freq: Every day | ORAL | 0 refills | Status: AC
Start: 2023-02-08 — End: ?
  Filled 2023-02-08: qty 30, 30d supply, fill #0

## 2023-02-08 MED ORDER — ATORVASTATIN CALCIUM 10 MG PO TABS
10.0000 mg | ORAL_TABLET | Freq: Every day | ORAL | 0 refills | Status: DC
Start: 2023-02-08 — End: 2023-02-15
  Filled 2023-02-08: qty 30, 30d supply, fill #0

## 2023-02-08 MED ORDER — NICOTINE 7 MG/24HR TD PT24
MEDICATED_PATCH | TRANSDERMAL | 0 refills | Status: DC
  Filled 2023-02-08: qty 7, 7d supply, fill #0

## 2023-02-08 NOTE — Progress Notes (Signed)
Inpatient Rehabilitation Discharge Medication Review by a Pharmacist  A complete drug regimen review was completed for this patient to identify any potential clinically significant medication issues.  High Risk Drug Classes Is patient taking? Indication by Medication  Antipsychotic No   Anticoagulant No   Antibiotic No   Opioid No   Antiplatelet Yes Clopidogrel, aspirin - CVA  Hypoglycemics/insulin No   Vasoactive Medication Yes Amlodipine - HTN  Chemotherapy No   Other Yes Atorvastatin - HLD Nicotine patch for 1 more week - smoking cessation Vitamin D, Vitamin E - supplements  PRN acetaminophen - mild pain or temp > 99.69F     Type of Medication Issue Identified Description of Issue Recommendation(s)  Drug Interaction(s) (clinically significant)     Duplicate Therapy     Allergy     No Medication Administration End Date     Incorrect Dose     Additional Drug Therapy Needed     Significant med changes from prior encounter (inform family/care partners about these prior to discharge). Warfarin was on prior med list but has not been taking for several years.  Had been prescribed to peripheral arterial disease per [facility].  CIR MD notes discussed with [facility] and no need for chronic warfarin.  Other       Clinically significant medication issues were identified that warrant physician communication and completion of prescribed/recommended actions by midnight of the next day:  No  Pharmacist comments: None  Time spent performing this drug regimen review (minutes):  20 minutes  Okey Regal, PharmD ----------- Addendum 02/08/22 Dennie Fetters, RPh 02/09/2023 10:58 AM

## 2023-02-08 NOTE — Progress Notes (Signed)
Occupational Therapy Session Note  Patient Details  Name: Mary [name] MRN: [id number] Date of Birth: [birth year]  Today's Date: 02/08/2023 OT Individual Time: 1453-1535 OT Individual Time Calculation (min): 42 min    Short Term Goals: Week 1:  OT Short Term Goal 1 (Week 1): STGs = LTGs due to ELOS  Skilled Therapeutic Interventions/Progress Updates:  Skilled OT intervention completed with focus on ambulatory transfers and IADL management. Pt received supine in bed, agreeable to session. No pain reported.  Completed all sit > stands and ambulatory transfers > 300 ft independently without AD during session.  Ambulated to gym in prep for IADL tasks. While in stance without LOB, pt participated in money task in prep for grocery shopping etc. Pt needed min A to recognize mistakes and for selecting correct amount of money. Needed further min A for higher level math calculation (I.e. $50.82-$30.25) to determine how much remaining cashier should return to her.  Seated, pt was then able to read, follow instructions and organize pills without difficulty into pill box. Pt indicates she normally manages her own medication and no safety concerns noted, however did discuss completing pill box in bulk and having husband check behind her for extra safety if meds require multiple times a day or have complex times beyond just AM/PM like in session.   Ambulated back to room, then remained in standing at sink as pt was made independent in room by therapy earlier. All needs met at end of session.   Therapy Documentation Precautions:  Precautions Precautions: Fall Precaution Comments: L inattention Restrictions Weight Bearing Restrictions: No    Therapy/Group: Individual Therapy  Melvyn Novas, MS, OTR/L  02/08/2023, 3:42 PM

## 2023-02-08 NOTE — Progress Notes (Signed)
Physical Therapy Discharge Summary  Patient Details  Name: Mary [name] MRN: [id number] Date of Birth: 02-04-1951  Date of Discharge from PT service:February 08, 2023  Today's Date: 02/08/2023 PT Individual Time: 800 830-     Pt presents supine in bed and agreeable to therapy.  Pt performs all transfers w/ independence or mod I.  Pt amb at least 150' w/o AD but no LOB.  Pt transferred in/out of car w/ mod I and negotiated 4 steps w/ 2 rails and mod I, reciprocal gait step-to gait pattern.  Pt returned to room and transferred sit to supine w/ independence.  Bed alarm on and all needs in reach.    Patient has met 8 of 8 long term goals due to improved activity tolerance, improved balance, increased strength, and improved coordination.  Patient to discharge at an ambulatory level Modified Independent.   Patient's care partner is independent to provide the necessary physical assistance at discharge.  Reasons goals not met: Pt has met or exceeded all LTG.  Pt returning to home w/o AD, mod I.  Recommendation:  Patient will benefit from ongoing skilled PT services in home health setting to continue to advance safe functional mobility, address ongoing impairments in balance, and minimize fall risk.  Equipment: No equipment provided  Reasons for discharge: treatment goals met and discharge from hospital  Patient/family agrees with progress made and goals achieved: Yes  PT Discharge Precautions/Restrictions Precautions Precautions: Fall Restrictions Weight Bearing Restrictions: No Vital Signs Therapy Vitals Temp: (!) 97.5 F (36.4 C) Temp Source: Oral Pulse Rate: 66 Resp: 16 BP: 105/73 Patient Position (if appropriate): Sitting Oxygen Therapy SpO2: 95 % O2 Device: Room Air Pain Pain Assessment Pain Scale: 0-10 Pain Score: 0-No pain Pain Interference Pain Interference Pain Effect on Sleep: 1. Rarely or not at all Pain Interference with Therapy Activities: 1. Rarely or not at  all Pain Interference with Day-to-Day Activities: 1. Rarely or not at all Vision/Perception  Vision - History Ability to See in Adequate Light: 0 Adequate  Cognition Overall Cognitive Status: Within Functional Limits for tasks assessed Arousal/Alertness: Awake/alert Safety/Judgment: Appears intact Sensation Sensation Light Touch: Appears Intact Coordination Gross Motor Movements are Fluid and Coordinated: Yes Fine Motor Movements are Fluid and Coordinated: Yes Heel Shin Test: intact Motor  Motor Motor: Hemiplegia Motor - Skilled Clinical Observations: general weakness  Mobility Bed Mobility Bed Mobility: Rolling Right;Rolling Left;Supine to Sit;Sit to Supine Rolling Right: Independent Rolling Left: Independent Supine to Sit: Independent Sit to Supine: Independent Transfers Transfers: Sit to Stand;Stand to Sit;Stand Pivot Transfers Sit to Stand: Independent with assistive device Stand to Sit: Independent with assistive device Stand Pivot Transfers: Independent with assistive device Transfer (Assistive device): None Locomotion  Gait Ambulation: Yes Gait Assistance: Independent with assistive device Gait Distance (Feet): 150 Feet Assistive device: None Gait Gait: Yes Gait Pattern: Step-through pattern Gait velocity: decreased Stairs / Additional Locomotion Stairs: Yes Stairs Assistance: Independent with assistive device Stair Management Technique: Two rails;Alternating pattern;Step to pattern;Forwards Number of Stairs: 12 Height of Stairs: 6 Curb: Independent with assistive device Wheelchair Mobility Wheelchair Mobility: No  Trunk/Postural Assessment  Cervical Assessment Cervical Assessment: Within Functional Limits Thoracic Assessment Thoracic Assessment: Within Functional Limits Lumbar Assessment Lumbar Assessment: Within Functional Limits  Balance Balance Balance Assessed: Yes Standardized Balance Assessment Standardized Balance Assessment: Timed Up and  Go Test Timed Up and Go Test TUG: Normal TUG Normal TUG (seconds): 21 Dynamic Sitting Balance Dynamic Sitting - Level of Assistance: 6: Modified independent (Device/Increase time)  Static Standing Balance Static Standing - Level of Assistance: 6: Modified independent (Device/Increase time) Dynamic Standing Balance Dynamic Standing - Level of Assistance: 6: Modified independent (Device/Increase time) Extremity Assessment      RLE Assessment RLE Assessment: Within Functional Limits General Strength Comments: grossly 4+/5 LLE Assessment LLE Assessment: Within Functional Limits General Strength Comments: grossly 4+/5   Lucio Edward 02/08/2023, 4:03 PM

## 2023-02-08 NOTE — Progress Notes (Signed)
PROGRESS NOTE   Subjective/Complaints:  No issues overnite, discussed isolated low CBG, asymptomatic   ROS: Denies fevers, chills, N/V, abdominal pain, constipation, diarrhea, SOB, cough, chest pain, new weakness or paraesthesias.    Objective:   No results found. No results for input(s): "WBC", "HGB", "HCT", "PLT" in the last 72 hours.  No results for input(s): "NA", "K", "CL", "CO2", "GLUCOSE", "BUN", "CREATININE", "CALCIUM" in the last 72 hours.   Intake/Output Summary (Last 24 hours) at 02/08/2023 0813 Last data filed at 02/08/2023 0730 Gross per 24 hour  Intake 590 ml  Output 300 ml  Net 290 ml         Physical Exam: Vital Signs Blood pressure (!) 106/94, pulse 82, temperature 98.7 F (37.1 C), temperature source Oral, resp. rate 20, height 5\' 4"  (1.626 m), weight 49.8 kg, SpO2 100 %.   General: No acute distress.  Ambulating from bathroom to sink with rolling walker, independent. Mood and affect are appropriate Heart: Regular rate and rhythm no rubs murmurs or extra sounds Lungs: Clear to auscultation, breathing unlabored, no rales or wheezes Abdomen: Positive bowel sounds, soft nontender to palpation, nondistended Extremities: No clubbing, cyanosis, or edema Skin: No evidence of breakdown, no evidence of rash Neurologic: motor strength is 5/5 in bilateral deltoid, bicep, tricep, grip, hip flexor, knee extensors, ankle dorsiflexor and plantar flexor   Musculoskeletal: Full range of motion in all 4 extremities. No joint swelling.  Ambulating independently with writing walker, stable gait pattern, excellent coordination.   Assessment/Plan: 1. Functional deficits which require 3+ hours per day of interdisciplinary therapy in a comprehensive inpatient rehab setting. Physiatrist is providing close team supervision and 24 hour management of active medical problems listed below. Physiatrist and rehab team continue  to assess barriers to discharge/monitor patient progress toward functional and medical goals  Care Tool:  Bathing    Body parts bathed by patient: Right arm, Left arm, Chest, Abdomen, Front perineal area, Buttocks, Right upper leg, Left upper leg, Face, Left lower leg, Right lower leg         Bathing assist Assist Level: Supervision/Verbal cueing     Upper Body Dressing/Undressing Upper body dressing        Upper body assist Assist Level: Supervision/Verbal cueing    Lower Body Dressing/Undressing Lower body dressing            Lower body assist Assist for lower body dressing: Supervision/Verbal cueing     Toileting Toileting    Toileting assist Assist for toileting: Supervision/Verbal cueing     Transfers Chair/bed transfer  Transfers assist     Chair/bed transfer assist level: Supervision/Verbal cueing     Locomotion Ambulation   Ambulation assist      Assist level: Contact Guard/Touching assist Assistive device: No Device Max distance: 150   Walk 10 feet activity   Assist     Assist level: Contact Guard/Touching assist Assistive device: Walker-rolling   Walk 50 feet activity   Assist    Assist level: Contact Guard/Touching assist Assistive device: Walker-rolling    Walk 150 feet activity   Assist    Assist level: Contact Guard/Touching assist Assistive device: No Device    Walk  10 feet on uneven surface  activity   Assist     Assist level: Minimal Assistance - Patient > 75% Assistive device: Other (comment) (no device although reaches for external support if available.)   Wheelchair     Assist Is the patient using a wheelchair?: Yes Type of Wheelchair: Manual Wheelchair activity did not occur: N/A         Wheelchair 50 feet with 2 turns activity    Assist    Wheelchair 50 feet with 2 turns activity did not occur: N/A       Wheelchair 150 feet activity     Assist  Wheelchair 150 feet activity  did not occur: N/A       Blood pressure (!) 106/94, pulse 82, temperature 98.7 F (37.1 C), temperature source Oral, resp. rate 20, height 5\' 4"  (1.626 m), weight 49.8 kg, SpO2 100 %.  Medical Problem List and Plan: 1. 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  Status post thrombectomy. WHile R carotid throbus was removed there is underlying carotid stenosis, with planned stent placement ~2wks              -patient may  shower             -ELOS/Goals: plan d/c in am - ICA stent scheduled with Dr Corliss Skains  for 7/1 stay on ASA and Plavix until then  2.  Antithrombotics: -DVT/anticoagulation:  Mechanical:  Antiembolism stockings, knee (TED hose) Bilateral lower extremities UE doppler done neg for clot             -antiplatelet therapy: Aspirin 81 mg daily and Plavix 75 mg daily 3. Pain Management: Tylenol as needed 4. Mood/Behavior/Sleep: Provide emotional support             -antipsychotic agents: N/A 5. Neuropsych/cognition: This patient is capable of making decisions on her own behalf. 6. Skin/Wound Care: Routine skin checks 7. Fluids/Electrolytes/Nutrition: Routine in and outs with follow-up chemistries 8.  Tobacco abuse.  NicoDerm patch.  Provide counseling 9.  Hyperlipidemia.  Lipitor 10.  PAD.  Patient started on Coumadin many years ago by vascular surgery in [facility] for peripheral arterial disease.  She had not seen vascular surgery for 3 years.  Discussed at length with vascular surgery and chronic Coumadin has been discontinued no plan to resume.  11. Mild constipation- LBM 2 days ago- change senna S to 1 po BID- had BM 6/6 12. Prediabetes- A1c 6.2- no need for additional monitoring, suspect the low CBG was technical error, if this recurs will need stat Serum glucose     LOS: 6 days A FACE TO FACE EVALUATION WAS PERFORMED  Erick Colace 02/08/2023, 8:13 AM

## 2023-02-08 NOTE — Plan of Care (Signed)
  Problem: RH Grooming Goal: LTG Patient will perform grooming w/assist,cues/equip (OT) Description: LTG: Patient will perform grooming with assist, with/without cues using equipment (OT) Outcome: Completed/Met   Problem: RH Bathing Goal: LTG Patient will bathe all body parts with assist levels (OT) Description: LTG: Patient will bathe all body parts with assist levels (OT) Outcome: Completed/Met   Problem: RH Dressing Goal: LTG Patient will perform upper body dressing (OT) Description: LTG Patient will perform upper body dressing with assist, with/without cues (OT). Outcome: Completed/Met Goal: LTG Patient will perform lower body dressing w/assist (OT) Description: LTG: Patient will perform lower body dressing with assist, with/without cues in positioning using equipment (OT) Outcome: Completed/Met   Problem: RH Toileting Goal: LTG Patient will perform toileting task (3/3 steps) with assistance level (OT) Description: LTG: Patient will perform toileting task (3/3 steps) with assistance level (OT)  Outcome: Completed/Met   Problem: RH Functional Use of Upper Extremity Goal: LTG Patient will use RT/LT upper extremity as a (OT) Description: LTG: Patient will use right/left upper extremity as a stabilizer/gross assist/diminished/nondominant/dominant level with assist, with/without cues during functional activity (OT) Outcome: Completed/Met   Problem: RH Toilet Transfers Goal: LTG Patient will perform toilet transfers w/assist (OT) Description: LTG: Patient will perform toilet transfers with assist, with/without cues using equipment (OT) Outcome: Completed/Met   Problem: RH Tub/Shower Transfers Goal: LTG Patient will perform tub/shower transfers w/assist (OT) Description: LTG: Patient will perform tub/shower transfers with assist, with/without cues using equipment (OT) Outcome: Completed/Met   

## 2023-02-08 NOTE — Progress Notes (Addendum)
Occupational Therapy Discharge Summary  Patient Details  Name: Mary [name] MRN: [id number] Date of Birth: 25-Jan-1951  Date of Discharge from OT service:February 08, 2023  Patient has met 8 of 8 long term goals due to improved activity tolerance, improved balance, postural control, ability to compensate for deficits, functional use of  LEFT upper and LEFT lower extremity, improved attention, improved awareness, and improved coordination.  Patient to discharge at overall Modified Independent level with basic self care and mobility but recommending supervision to set up to getting in and out of tub and with cooking, cleaning, IADLs.  Patient's care partner is independent to provide the necessary physical and cognitive assistance at discharge.  Formal family education with spouse did not occur as pt is quite high level. Pt given an education sheet to give her spouse on providing supervision for the above mentioned tasks.   Reasons goals not met: n/a  Recommendation:  Patient will benefit from ongoing skilled OT services in home health setting to continue to advance functional skills in the area of iADL.  Equipment: No equipment provided  Reasons for discharge: treatment goals met  Patient/family agrees with progress made and goals achieved: Yes  OT Discharge Precautions/Restrictions  Precautions Precautions: Fall Precaution Comments: very mild and intermittent L inattention Restrictions Weight Bearing Restrictions: No  ADL ADL Eating: Independent Grooming: Independent Where Assessed-Grooming: Standing at sink Upper Body Bathing: Setup Where Assessed-Upper Body Bathing: Shower Lower Body Bathing: Setup Where Assessed-Lower Body Bathing: Shower Upper Body Dressing: Independent Where Assessed-Upper Body Dressing: Edge of bed Lower Body Dressing: Independent Where Assessed-Lower Body Dressing: Edge of bed Toileting: Independent Where Assessed-Toileting: Teacher, adult education:  Community education officer Method: Proofreader: Chiropractor Transfer: Distant supervision Tub/Shower Transfer Method: Ship broker: Information systems manager with back Film/video editor: Distant supervision Film/video editor Method: Designer, industrial/product: Information systems manager with back Vision Baseline Vision/History: 1 Wears glasses Patient Visual Report: No change from baseline Vision Assessment?: No apparent visual deficits Perception  Perception: Impaired Inattention/Neglect: Other (comment) (mild deficits with L attention) Praxis Praxis: Intact Cognition Cognition Overall Cognitive Status: Impaired/Different from baseline Arousal/Alertness: Awake/alert Person: Oriented Place: Oriented Situation: Oriented Memory: Impaired Memory Impairment: Decreased short term memory Decreased Short Term Memory: Verbal basic;Functional basic Attention: Focused;Sustained Focused Attention: Appears intact Sustained Attention: Appears intact Awareness: Impaired Awareness Impairment: Emergent impairment Problem Solving: Impaired Problem Solving Impairment: Functional complex;Functional basic Executive Function: Sequencing;Decision Making;Reasoning Reasoning: Impaired Reasoning Impairment: Verbal basic Sequencing: Impaired Sequencing Impairment: Verbal basic Decision Making: Impaired Safety/Judgment: Appears intact Brief Interview for Mental Status (BIMS) Repetition of Three Words (First Attempt): 3 Temporal Orientation: Year: Correct Temporal Orientation: Month: Accurate within 5 days Temporal Orientation: Day: Correct Recall: "Sock": Yes, no cue required Recall: "Blue": Yes, no cue required Recall: "Bed": Yes, no cue required BIMS Summary Score: 15 Sensation Sensation Light Touch: Appears Intact Hot/Cold: Appears Intact Proprioception: Appears Intact Stereognosis: Appears Intact Coordination Gross Motor Movements are  Fluid and Coordinated: Yes Fine Motor Movements are Fluid and Coordinated: Yes Heel Shin Test: intact 9 Hole Peg Test: L hand 1 min 16 seconds vs 1 min 25 sec a few days ago ( R hand 55 seconds) Motor  Motor Motor: Hemiplegia Motor - Skilled Clinical Observations: general weakness Mobility  Bed Mobility Bed Mobility: Rolling Right;Rolling Left;Supine to Sit;Sit to Supine Rolling Right: Independent Rolling Left: Independent Supine to Sit: Independent Sit to Supine: Independent Transfers Sit to Stand: Independent with assistive device Stand to Sit: Independent  with assistive device  Trunk/Postural Assessment  Cervical Assessment Cervical Assessment: Within Functional Limits Thoracic Assessment Thoracic Assessment: Within Functional Limits Lumbar Assessment Lumbar Assessment: Within Functional Limits  Balance Balance Balance Assessed: Yes Standardized Balance Assessment Standardized Balance Assessment: Timed Up and Go Test Timed Up and Go Test TUG: Normal TUG Normal TUG (seconds): 21 Static Sitting Balance Static Sitting - Level of Assistance: 7: Independent Dynamic Sitting Balance Dynamic Sitting - Level of Assistance: 7: Independent Static Standing Balance Static Standing - Level of Assistance: 7: Independent Dynamic Standing Balance Dynamic Standing - Level of Assistance: 6: Modified independent (Device/Increase time) Dynamic Standing - Balance Activities: Reaching for objects;Lateral lean/weight shifting;Forward lean/weight shifting Extremity/Trunk Assessment RUE Assessment RUE Assessment: Within Functional Limits LUE Assessment General Strength Comments: shoulder 4/5, L grasp 65 lbs (vs 70 on R side) improved 5 lbs from a few days ago. Brunstrum level for arm: Stage V Relative Independence from Synergy Brunstrum level for hand: Stage VI Isolated joint movements   Karyssa Amaral 02/08/2023, 4:36 PM

## 2023-02-08 NOTE — Plan of Care (Signed)
  Problem: RH Problem Solving Goal: LTG Patient will demonstrate problem solving for (SLP) Description: LTG:  Patient will demonstrate problem solving for basic/complex daily situations with cues  (SLP) Outcome: Not Met (add Reason) Flowsheets (Taken 02/08/2023 1619) LTG: Patient will demonstrate problem solving for (SLP): Basic daily situations LTG Patient will demonstrate problem solving for:  Supervision  Minimal Assistance - Patient > 75% Note: Slower than anticipated progress toward goal   Problem: RH Attention Goal: LTG Patient will demonstrate this level of attention during functional activites (SLP) Description: LTG:  Patient will will demonstrate this level of attention during functional activites (SLP) Outcome: Completed/Met Flowsheets (Taken 02/08/2023 1619) Patient will demonstrate during cognitive/linguistic activities the attention type of:  Focused  Sustained  Selective Patient will demonstrate this level of attention during cognitive/linguistic activities in: Home LTG: Patient will demonstrate this level of attention during cognitive/linguistic activities with assistance of (SLP): Supervision   Problem: RH Awareness Goal: LTG: Patient will demonstrate awareness during functional activites type of (SLP) Description: LTG: Patient will demonstrate awareness during functional activites type of (SLP) Outcome: Not Met (add Reason) Flowsheets (Taken 02/08/2023 1619) Patient will demonstrate during cognitive/linguistic activities awareness type of:  Intellectual  Emergent LTG: Patient will demonstrate awareness during cognitive/linguistic activities with assistance of (SLP): Supervision Note: Slower than anticipated progress toward goal   Problem: RH Memory Goal: LTG Patient will demonstrate ability for day to day (SLP) Description: LTG:   Patient will demonstrate ability for day to day recall/carryover during cognitive/linguistic activities with assist  (SLP) Outcome:  Completed/Met Flowsheets Taken 02/08/2023 1619 by Renaee Munda, CCC-SLP LTG: Patient will demonstrate ability for day to day recall/carryover during cognitive/linguistic activities with assist (SLP): Supervision Taken 02/03/2023 1220 by Ellery Plunk, CCC-SLP LTG: Patient will demonstrate ability for day to day recall:  New information  Daily complex information Goal: LTG Patient will use memory compensatory aids to (SLP) Description: LTG:  Patient will use memory compensatory aids to recall biographical/new, daily complex information with cues (SLP) Outcome: Completed/Met Flowsheets (Taken 02/03/2023 1220 by Ellery Plunk, CCC-SLP) LTG: Patient will use memory compensatory aids to (SLP): Supervision   Problem: RH Memory Goal: LTG Patient will use memory compensatory aids to (SLP) Description: LTG:  Patient will use memory compensatory aids to recall biographical/new, daily complex information with cues (SLP) Outcome: Completed/Met Flowsheets (Taken 02/03/2023 1220 by Ellery Plunk, CCC-SLP) LTG: Patient will use memory compensatory aids to (SLP): Supervision

## 2023-02-08 NOTE — Progress Notes (Signed)
Speech Language Pathology Discharge Summary  Patient Details  Name: Mary [name] MRN: [id number] Date of Birth: [birth year]  Date of Discharge from SLP service:February 08, 2023  Today's Date: 02/08/2023 SLP Individual Time: 0900-1000 SLP Individual Time Calculation (min): 60 min   Skilled Therapeutic Interventions:  Pt was seen in am to address cognitive re- training. Pt was alert and seen at bedside. SLP challenged pt in attention and problem solving task. Given a structured task, pt challenged to utilize selective attention while attending to L side. Pt completed task in mildly distracting environment with Sup A to attend to left side and to attend to task. She completed task with distractions during a 16 minute time window. SLP also challenged pt in problem solving daily activities, medication management, and financial task. Given scenarios presented verbally, pt identified most appropriate solutions with 75% acc with min A. In missed opportunities SLP reviewed correct response and rationale. Pt was left at bedside with call button within reach and chair alarm active. SLP to continue POC.    Patient has met 4 of 6 long term goals.  Patient to discharge at overall Supervision;Min level.  Reasons goals not met: Slower than anticipated progress toward awareness and problem solving goals   Clinical Impression/Discharge Summary: Patient has made steady gains and has met 4 out of 6 LTG's this admission due to improved attention and memory. Pt is currently a Min A to sup A for cognitive tasks and requires assistance with higher level executive function tasks. Pt/ family education complete and pt will discharge home with supervision from family, friends, etc. in order to maximize her functional independence and reduce caregiver burden.  Care Partner:  Caregiver Able to Provide Assistance: Yes  Type of Caregiver Assistance: Cognitive  Recommendation:  Outpatient SLP  Rationale for SLP Follow Up:  Maximize cognitive function and independence    Reasons for discharge: Treatment goals met;Discharged from hospital   Patient/Family Agrees with Progress Made and Goals Achieved: Yes    Renaee Munda 02/08/2023, 4:26 PM

## 2023-02-08 NOTE — Progress Notes (Signed)
Occupational Therapy Session Note  Patient Details  Name: Mary [name] MRN: [id number] Date of Birth: May 11, 1951  Today's Date: 02/08/2023 OT Individual Time: 1300-1415 OT Individual Time Calculation (min): 75 min    Short Term Goals: Week 1:  OT Short Term Goal 1 (Week 1): STGs = LTGs due to ELOS  Skilled Therapeutic Interventions/Progress Updates:    Pt seen this session for ADL training to ensure pt could complete all tasks at an independent level (except for shower transfers ). Pt received in bed and was able to get out of bed, move around room, gather clothing from closet,  toilet, get into shower, bathe with no Assist, no cues needed.    She used a shower seat for part of the shower.   She completed dressing independently.  Pt then worked on dynamic reach to floor level several times to pick up towels to put in the laundry hamper.   Independently ambulated to gym with therapist to engage in [facility] test and reassessment of strength.  Ambulated to ADL apt to practice stepping in and out of bathtub 2x using grab bars.  Provided pt with a supervision recommendation sheet for shower transfers and IADLs.  Pt returned to her room with all needs met.   Pt is now independent in the room.    Therapy Documentation Precautions:  Precautions Precautions: Fall Precaution Comments: L inattention Restrictions Weight Bearing Restrictions: No  Pain: Pain Assessment Pain Scale: 0-10 Pain Score: 0-No pain ADL: ADL Eating: Independent Grooming: Independent Where Assessed-Grooming: Standing at sink Upper Body Bathing: Setup Where Assessed-Upper Body Bathing: Shower Lower Body Bathing: Setup Where Assessed-Lower Body Bathing: Shower Upper Body Dressing: Independent Where Assessed-Upper Body Dressing: Edge of bed Lower Body Dressing: Independent Where Assessed-Lower Body Dressing: Edge of bed Toileting: Independent Where Assessed-Toileting: Teacher, adult education: Sport and exercise psychologist Method: Proofreader: Chiropractor Transfer: Distant supervision Web designer Method: Ship broker: Information systems manager with back Film/video editor: Distant supervision Film/video editor Method: Designer, industrial/product: Information systems manager with back   Therapy/Group: Individual Therapy  Rupa Lagan 02/08/2023, 4:24 PM

## 2023-02-09 ENCOUNTER — Other Ambulatory Visit (HOSPITAL_COMMUNITY): Payer: Self-pay

## 2023-02-09 ENCOUNTER — Other Ambulatory Visit (HOSPITAL_COMMUNITY): Payer: Self-pay | Admitting: Interventional Radiology

## 2023-02-09 DIAGNOSIS — I6521 Occlusion and stenosis of right carotid artery: Secondary | ICD-10-CM

## 2023-02-09 NOTE — Progress Notes (Signed)
Inpatient Rehabilitation Care Coordinator Discharge Note   Patient Details  Name: Mary [name] MRN: [id number] Date of Birth: February 21, 1951   Discharge location: Home  Length of Stay: 8 Days  Discharge activity level: Supervision  Home/community participation: Spouse  Patient response [id number] Literacy - How often do you need to have someone help you when you read instructions, pamphlets, or other written material from your doctor or pharmacy?: Never  Patient response [id number] Isolation - How often do you feel lonely or isolated from those around you?: Never  Services provided included: MD, PT, OT, SLP, RD, RN, CM, TR, Pharmacy, SW  Financial Services:  Field seismologist Utilized: Private Insurance [employer]  Choices offered to/list presented to: Patient  Follow-up services arranged:  Home Health Home Health Agency: [facility] OT SLP         Patient response to transportation need: Is the patient able to respond to transportation needs?: Yes In the past 12 months, has lack of transportation kept you from medical appointments or from getting medications?: No In the past 12 months, has lack of transportation kept you from meetings, work, or from getting things needed for daily living?: No   Patient/Family verbalized understanding of follow-up arrangements:  Yes  Individual responsible for coordination of the follow-up plan: self or spouse  Confirmed correct DME delivered: Andria Rhein 02/09/2023    Comments (or additional information):    Andria Rhein

## 2023-02-09 NOTE — Progress Notes (Signed)
Discharge instructions provided by Deatra Ina, PA. Medications received from TOC. Staff assisted patient off the unit; patient left via private care with Husband.    Tilden Dome, LPN

## 2023-02-09 NOTE — Progress Notes (Signed)
PROGRESS NOTE   Subjective/Complaints:  No issues overnite   ROS: Denies fevers, chills, N/V, abdominal pain, constipation, diarrhea, SOB, cough, chest pain, new weakness or paraesthesias.    Objective:   No results found. No results for input(s): "WBC", "HGB", "HCT", "PLT" in the last 72 hours.  No results for input(s): "NA", "K", "CL", "CO2", "GLUCOSE", "BUN", "CREATININE", "CALCIUM" in the last 72 hours.   Intake/Output Summary (Last 24 hours) at 02/09/2023 0758 Last data filed at 02/08/2023 1757 Gross per 24 hour  Intake 420 ml  Output --  Net 420 ml         Physical Exam: Vital Signs Blood pressure 112/73, pulse 76, temperature 97.9 F (36.6 C), temperature source Oral, resp. rate 17, height 5\' 4"  (1.626 m), weight 49.8 kg, SpO2 100 %.   General: No acute distress.  Ambulating from bathroom to sink with rolling walker, independent. Mood and affect are appropriate Heart: Regular rate and rhythm no rubs murmurs or extra sounds Lungs: Clear to auscultation, breathing unlabored, no rales or wheezes Abdomen: Positive bowel sounds, soft nontender to palpation, nondistended Extremities: No clubbing, cyanosis, or edema Skin: No evidence of breakdown, no evidence of rash Neurologic: motor strength is 5/5 in bilateral deltoid, bicep, tricep, grip, hip flexor, knee extensors, ankle dorsiflexor and plantar flexor   Musculoskeletal: Full range of motion in all 4 extremities. No joint swelling.  Ambulating independently with writing walker, stable gait pattern, excellent coordination.   Assessment/Plan: 1. Functional deficits due to R MCA infarct Stable for D/C today F/u PCP in 3-4 weeks F/u IR 03/01/23 for R ICA stent , Dr Corliss Skains  No driving for 2 wk then follow these instructions Graduated return to driving instructions were provided. It is recommended that the patient first drives with another licensed driver in an  empty parking lot. If the patient does well with this, and they can drive on a quiet street with the licensed driver. If the patient does well with this they can drive on a busy street with a licensed driver. If the patient does well with this, the next time out they can go by himself. For the first month after resuming driving, I recommend no nighttime or Interstate driving.   See D/C summary See D/C instructions   Care Tool:  Bathing    Body parts bathed by patient: Right arm, Left arm, Chest, Abdomen, Front perineal area, Buttocks, Right upper leg, Left upper leg, Face, Left lower leg, Right lower leg         Bathing assist Assist Level: Set up assist     Upper Body Dressing/Undressing Upper body dressing   What is the patient wearing?: Pull over shirt, Bra    Upper body assist Assist Level: Independent    Lower Body Dressing/Undressing Lower body dressing      What is the patient wearing?: Underwear/pull up, Pants     Lower body assist Assist for lower body dressing: Independent     Toileting Toileting    Toileting assist Assist for toileting: Independent     Transfers Chair/bed transfer  Transfers assist     Chair/bed transfer assist level: Independent with assistive device  Locomotion Ambulation   Ambulation assist      Assist level: Independent with assistive device Assistive device: No Device Max distance: 200   Walk 10 feet activity   Assist     Assist level: Independent with assistive device Assistive device: No Device   Walk 50 feet activity   Assist    Assist level: Independent with assistive device Assistive device: No Device    Walk 150 feet activity   Assist    Assist level: Independent with assistive device Assistive device: No Device    Walk 10 feet on uneven surface  activity   Assist     Assist level: Supervision/Verbal cueing Assistive device: Other (comment) (no AD)   Wheelchair     Assist Is  the patient using a wheelchair?: No Type of Wheelchair: Manual Wheelchair activity did not occur: N/A         Wheelchair 50 feet with 2 turns activity    Assist    Wheelchair 50 feet with 2 turns activity did not occur: N/A       Wheelchair 150 feet activity     Assist  Wheelchair 150 feet activity did not occur: N/A       Blood pressure 112/73, pulse 76, temperature 97.9 F (36.6 C), temperature source Oral, resp. rate 17, height 5\' 4"  (1.626 m), weight 49.8 kg, SpO2 100 %.  Medical Problem List and Plan: 1. 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  Status post thrombectomy. WHile R carotid throbus was removed there is underlying carotid stenosis, with planned stent placement ~2wks              -patient may  shower             -ELOS/Goals: plan d/c today  - ICA stent scheduled with Dr Corliss Skains  for 7/1 stay on ASA and Plavix until then  2.  Antithrombotics: -DVT/anticoagulation:  Mechanical:  Antiembolism stockings, knee (TED hose) Bilateral lower extremities UE doppler done neg for clot             -antiplatelet therapy: Aspirin 81 mg daily and Plavix 75 mg daily 3. Pain Management: Tylenol as needed 4. Mood/Behavior/Sleep: Provide emotional support             -antipsychotic agents: N/A 5. Neuropsych/cognition: This patient is capable of making decisions on her own behalf. 6. Skin/Wound Care: Routine skin checks 7. Fluids/Electrolytes/Nutrition: Routine in and outs with follow-up chemistries 8.  Tobacco abuse.  NicoDerm patch.  Provide counseling 9.  Hyperlipidemia.  Lipitor 10.  PAD.  Patient started on Coumadin many years ago by vascular surgery in [facility] for peripheral arterial disease.  She had not seen vascular surgery for 3 years.  Discussed at length with vascular surgery and chronic Coumadin has been discontinued no plan to resume.  11. Mild  constipation- LBM 2 days ago- change senna S to 1 po BID- had BM 6/6 12. Prediabetes- A1c 6.2- no need for additional monitoring, suspect the low CBG was technical error, if this recurs will need stat Serum glucose     LOS: 7 days A FACE TO FACE EVALUATION WAS PERFORMED  Erick Colace 02/09/2023, 7:58 AM

## 2023-02-15 ENCOUNTER — Ambulatory Visit (INDEPENDENT_AMBULATORY_CARE_PROVIDER_SITE_OTHER): Payer: Medicare PPO | Admitting: Family Medicine

## 2023-02-15 ENCOUNTER — Encounter: Payer: Self-pay | Admitting: Family Medicine

## 2023-02-15 VITALS — BP 144/72 | HR 88 | Temp 98.2°F | Resp 16 | Ht 64.0 in | Wt 114.3 lb

## 2023-02-15 DIAGNOSIS — R7303 Prediabetes: Secondary | ICD-10-CM | POA: Diagnosis not present

## 2023-02-15 DIAGNOSIS — E782 Mixed hyperlipidemia: Secondary | ICD-10-CM

## 2023-02-15 DIAGNOSIS — Z7902 Long term (current) use of antithrombotics/antiplatelets: Secondary | ICD-10-CM

## 2023-02-15 DIAGNOSIS — Z09 Encounter for follow-up examination after completed treatment for conditions other than malignant neoplasm: Secondary | ICD-10-CM | POA: Diagnosis not present

## 2023-02-15 DIAGNOSIS — I6521 Occlusion and stenosis of right carotid artery: Secondary | ICD-10-CM | POA: Diagnosis not present

## 2023-02-15 DIAGNOSIS — I10 Essential (primary) hypertension: Secondary | ICD-10-CM

## 2023-02-15 DIAGNOSIS — D689 Coagulation defect, unspecified: Secondary | ICD-10-CM

## 2023-02-15 DIAGNOSIS — Z7901 Long term (current) use of anticoagulants: Secondary | ICD-10-CM

## 2023-02-15 DIAGNOSIS — E162 Hypoglycemia, unspecified: Secondary | ICD-10-CM

## 2023-02-15 DIAGNOSIS — Z5181 Encounter for therapeutic drug level monitoring: Secondary | ICD-10-CM

## 2023-02-15 DIAGNOSIS — I69354 Hemiplegia and hemiparesis following cerebral infarction affecting left non-dominant side: Secondary | ICD-10-CM

## 2023-02-15 MED ORDER — LANCETS MISC. MISC
5 refills | Status: DC
Start: 2023-02-15 — End: 2023-11-08

## 2023-02-15 MED ORDER — ATORVASTATIN CALCIUM 10 MG PO TABS
10.0000 mg | ORAL_TABLET | Freq: Every day | ORAL | 3 refills | Status: DC
Start: 2023-02-15 — End: 2023-11-10

## 2023-02-15 MED ORDER — BLOOD GLUCOSE MONITORING SUPPL DEVI
1.0000 | Freq: Every day | 0 refills | Status: AC | PRN
Start: 2023-02-15 — End: ?

## 2023-02-15 MED ORDER — CLOPIDOGREL BISULFATE 75 MG PO TABS
75.0000 mg | ORAL_TABLET | Freq: Every day | ORAL | 1 refills | Status: DC
Start: 2023-02-15 — End: 2023-09-02

## 2023-02-15 MED ORDER — BLOOD GLUCOSE TEST VI STRP
ORAL_STRIP | 5 refills | Status: DC
Start: 2023-02-15 — End: 2023-11-08

## 2023-02-15 NOTE — Patient Instructions (Signed)
Hypoglycemia Hypoglycemia is when the sugar (glucose) level in your blood is too low. Low blood sugar can happen to people who have diabetes and people who do not have diabetes. Low blood sugar can happen quickly, and it can be an emergency. What are the causes? This condition happens most often in people who have diabetes. It may be caused by: Diabetes medicine. Not eating enough, or not eating often enough. Doing more physical activity. Drinking alcohol on an empty stomach. If you do not have diabetes, this condition may be caused by: A tumor in the pancreas. Not eating enough, or not eating for long periods at a time (fasting). A very bad infection or illness. Problems after having weight loss (bariatric) surgery. Kidney failure or liver failure. Certain medicines. What increases the risk? This condition is more likely to develop in people who: Have diabetes and take medicines to lower their blood sugar. Abuse alcohol. Have a very bad illness. What are the signs or symptoms? Mild Hunger. Sweating and feeling clammy. Feeling dizzy or light-headed. Being sleepy or having trouble sleeping. Feeling like you may vomit (nauseous). A fast heartbeat. A headache. Blurry vision. Mood changes, such as: Being grouchy. Feeling worried or nervous (anxious). Tingling or loss of feeling (numbness) around your mouth, lips, or tongue. Moderate Confusion and poor judgment. Behavior changes. Weakness. Uneven heartbeat. Trouble with moving (coordination). Very low Very low blood sugar (severe hypoglycemia) is a medical emergency. It can cause: Fainting. Seizures. Loss of consciousness (coma). Death. How is this treated? Treating low blood sugar Low blood sugar is often treated by eating or drinking something that has sugar in it right away. The food or drink should contain 15 grams of a fast-acting carb (carbohydrate). Options include: 4 oz (120 mL) of fruit juice. 4 oz (120 mL) of  regular soda (not diet soda). A few pieces of hard candy. Check food labels to see how many pieces to eat for 15 grams. 1 Tbsp (15 mL) of sugar or honey. 4 glucose tablets. 1 tube of glucose gel. Treating low blood sugar if you have diabetes If you can think clearly and swallow safely, follow the 15:15 rule: Take 15 grams of a fast-acting carb. Talk with your doctor about how much you should take. Always keep a source of fast-acting carb with you, such as: Glucose tablets (take 4 tablets). A few pieces of hard candy. Check food labels to see how many pieces to eat for 15 grams. 4 oz (120 mL) of fruit juice. 4 oz (120 mL) of regular soda (not diet soda). 1 Tbsp (15 mL) of honey or sugar. 1 tube of glucose gel. Check your blood sugar 15 minutes after you take the carb. If your blood sugar is still at or below 70 mg/dL (3.9 mmol/L), take 15 grams of a carb again. If your blood sugar does not go above 70 mg/dL (3.9 mmol/L) after 3 tries, get help right away. After your blood sugar goes back to normal, eat a meal or a snack within 1 hour.  Treating very low blood sugar If your blood sugar is below 54 mg/dL (3 mmol/L), you have very low blood sugar, or severe hypoglycemia. This is an emergency. Get medical help right away. If you have very low blood sugar and you cannot eat or drink, you will need to be given a hormone called glucagon. A family member or friend should learn how to check your blood sugar and how to give you glucagon. Ask your doctor if  you need to have an emergency glucagon kit at home. Very low blood sugar may also need to be treated in a hospital. Follow these instructions at home: General instructions Take over-the-counter and prescription medicines only as told by your doctor. Stay aware of your blood sugar as told by your doctor. If you drink alcohol: Limit how much you have to: 0-1 drink a day for women who are not pregnant. 0-2 drinks a day for men. Know how much  alcohol is in your drink. In the [place] one drink equals one 12 oz bottle of beer (355 mL), one 5 oz glass of wine (148 mL), or one 1 oz glass of hard liquor (44 mL). Be sure to eat food when you drink alcohol. Know that your body absorbs alcohol quickly. This may lead to low blood sugar later. Be sure to keep checking your blood sugar. Keep all follow-up visits. If you have diabetes:  Always have a fast-acting carb (15 grams) with you to treat low blood sugar. Follow your diabetes care plan as told by your doctor. Make sure you: Know the symptoms of low blood sugar. Check your blood sugar as often as told. Always check it before and after exercise. Always check your blood sugar before you drive. Take your medicines as told. Follow your meal plan. Eat on time. Do not skip meals. Share your diabetes care plan with: Your work or school. People you live with. Carry a card or wear jewelry that says you have diabetes. Where to find more information American Diabetes Association: www.diabetes.org Contact a doctor if: You have trouble keeping your blood sugar in your target range. You have low blood sugar often. Get help right away if: You still have symptoms after you eat or drink something that contains 15 grams of fast-acting carb, and you cannot get your blood sugar above 70 mg/dL by following the 16:10 rule. Your blood sugar is below 54 mg/dL (3 mmol/L). You have a seizure. You faint. These symptoms may be an emergency. Get help right away. Call your local emergency services (911 in the [place] Do not wait to see if the symptoms will go away. Do not drive yourself to the hospital. Summary Hypoglycemia happens when the level of sugar (glucose) in your blood is too low. Low blood sugar can happen to people who have diabetes and people who do not have diabetes. Low blood sugar can happen quickly, and it can be an emergency. Make sure you know the symptoms of low blood sugar and know how  to treat it. Always keep a source of sugar (fast-acting carb) with you to treat low blood sugar. This information is not intended to replace advice given to you by your health care provider. Make sure you discuss any questions you have with your health care provider. Document Revised: 07/18/2020 Document Reviewed: 07/18/2020 Elsevier Patient Education  2024 [employer].

## 2023-02-15 NOTE — Progress Notes (Unsigned)
Name: Mary [name]   MRN: [id number]    DOB: 1951/08/30   Date:02/15/2023       Progress Note  Chief Complaint  Patient presents with   Follow-up   Anticoagulation     Subjective:   Mary [name] is a 72 y.o. female, presents to clinic for HFU appt and PT INR check   HFU and stroke admitted 01/27/2023 to 02/09/2023  Lab Results  Component Value Date   CHOL 115 01/28/2023   HDL 51 01/28/2023   LDLCALC 53 01/28/2023   TRIG 57 01/28/2023   CHOLHDL 2.3 01/28/2023    Stroke:  right MCA and MCA/ACA watershed zone given a mixed etiology of scattered LAA embolic infarct phenomena plus cortical watershed hypoperfusion from the intramural right ICA thrombus, likely from underlying large vessel disease even on coumadin with therapeutic INR s/p successful mechanical thrombectomy with underlying 55% right ICA stenosis CT no acute finding CTA showed right carotid mural thrombus with high grade stenosis 80%. More proximally in the right internal carotid artery just after the bifurcation, mixed plaque results in proximally 60% stenosis. MRI  Scattered acute infarcts throughout the right cerebral hemisphere, with more focal areas of infarction in the right frontal and parietal cortex, watershed territory, right caudate and lentiform nucleus, and right occipital lobe. Carotid Doppler 1 to 39% bilaterally 2D Echo EF 55 to 60% LDL 53 HgbA1c 6.2 Coumadin for VTE prophylaxis warfarin daily prior to admission, now on aspirin 81 mg daily and Plavix 75 mg daily yes   Patient counseled to be compliant with her antithrombotic medications Ongoing aggressive stroke risk factor management Therapy recommendations: CIR Disposition:  pending   Anticoagulation with Coumadin for PAD On Coumadin at home PTA for peripheral arterial disease INR 2.4->2.2->2.1-> 2.7-> 2.9 Given developed carotid intramural thrombus in the setting of therapeutic INR, will discontinue warfarin and switch to dual antiplatelet  therapy of aspirin and Plavix After discussion with vascular surgery here, will discontinue Coumadin and start DAPT with aspirin and Plavix   Hypertension Stable Off Cleviprex BP goal less than 180/105 Long term BP goal normotensive   Hyperlipidemia Home meds: Lipitor 10 LDL 53, goal < 70 Now on Lipitor 10 No high intensity statin given LDL within goal Continue statin at discharge  ***   Current Outpatient Medications:    acetaminophen (TYLENOL) 325 MG tablet, Take 2 tablets (650 mg total) by mouth every 4 (four) hours as needed for mild pain (or temp > 37.5 C (99.5 F))., Disp: , Rfl:    amLODipine (NORVASC) 5 MG tablet, Take 1 tablet (5 mg total) by mouth daily., Disp: 30 tablet, Rfl: 0   aspirin 81 MG chewable tablet, Chew 1 tablet (81 mg total) by mouth daily., Disp: 30 tablet, Rfl: 1   atorvastatin (LIPITOR) 10 MG tablet, Take 1 tablet (10 mg total) by mouth at bedtime., Disp: 30 tablet, Rfl: 0   cholecalciferol (VITAMIN D3) 25 MCG (1000 UNIT) tablet, Take 2 tablets (2,000 Units total) by mouth daily., Disp: 30 tablet, Rfl: 0   clopidogrel (PLAVIX) 75 MG tablet, Take 1 tablet (75 mg total) by mouth daily., Disp: 30 tablet, Rfl: 1   nicotine (NICODERM CQ - DOSED IN MG/24 HR) 7 mg/24hr patch, 7 mg patch daily x 1 week and stop, Disp: 7 patch, Rfl: 0   vitamin E 400 UNIT capsule, Take 180 Units by mouth daily. , Disp: , Rfl:   Patient Active Problem List   Diagnosis Date Noted   Right  middle cerebral artery stroke (HCC) 02/02/2023   Stroke (HCC) 01/27/2023   Internal carotid artery occlusion, right 01/27/2023   Coagulation disorder (HCC) 01/01/2022   Anticoagulated on Coumadin 02/05/2020   History of gout 06/23/2019   Coronary artery disease 02/16/2019   Benign neoplasm of cecum    Benign neoplasm of transverse colon    Polyp of sigmoid colon    External hemorrhoids    Hip arthritis 10/27/2017   Encounter for monitoring Coumadin therapy 02/27/2015   PAD (peripheral artery  disease) (HCC)    Essential hypertension    Mixed hyperlipidemia    Tobacco abuse    Hyperuricemia     Past Surgical History:  Procedure Laterality Date   ANGIOPLASTY  July 2011   PAD- popliteal artery   COLONOSCOPY WITH PROPOFOL N/A 01/13/2018   Procedure: COLONOSCOPY WITH PROPOFOL;  Surgeon: Toney Reil, MD;  Location: [facility] ENDOSCOPY;  Service: Gastroenterology;  Laterality: N/A;   COLONOSCOPY WITH PROPOFOL N/A 01/14/2018   Procedure: COLONOSCOPY WITH PROPOFOL;  Surgeon: Pasty Spillers, MD;  Location: [facility] ENDOSCOPY;  Service: Endoscopy;  Laterality: N/A;   COLONOSCOPY WITH PROPOFOL N/A 07/06/2018   Procedure: COLONOSCOPY WITH PROPOFOL;  Surgeon: Pasty Spillers, MD;  Location: [facility] ENDOSCOPY;  Service: Endoscopy;  Laterality: N/A;   CORONARY ANGIOPLASTY     IR CT HEAD LTD  01/27/2023   IR PERCUTANEOUS ART THROMBECTOMY/INFUSION INTRACRANIAL INC DIAG ANGIO  01/27/2023   JOINT REPLACEMENT     Leg Bypass Surgery Right 08/2010   OVARIAN CYST SURGERY  1980s   POPLITEAL ARTERY STENT Right 05/2010   POPLITEAL ARTERY STENT Right 07/2010   RADIOLOGY WITH ANESTHESIA N/A 01/27/2023   Procedure: IR WITH ANESTHESIA;  Surgeon: Radiologist, Medication, MD;  Location: [facility] OR;  Service: Radiology;  Laterality: N/A;   TOTAL HIP ARTHROPLASTY Left 10/27/2017   Procedure: TOTAL HIP ARTHROPLASTY;  Surgeon: Deeann Saint, MD;  Location: [facility] ORS;  Service: Orthopedics;  Laterality: Left;    Family History  Problem Relation Age of Onset   Arthritis Mother    Diabetes Mother    Stroke Mother    Hypertension Mother    Dementia Mother    Cancer Father    Arthritis Sister    Seizures Sister    Diabetes Sister    Hypertension Sister    COPD Sister    Cancer Brother    Stroke Maternal Grandmother    Diabetes Paternal Grandmother    Cancer Sister    Diabetes Sister    Arthritis Sister    Diabetes Brother    Stroke Brother    Healthy Brother    Diabetes Brother    Stroke Brother     Cancer Brother    Heart disease Neg Hx    Breast cancer Neg Hx     Social History   Tobacco Use   Smoking status: Every Day    Packs/day: 0.50    Years: 40.00    Additional pack years: 0.00    Total pack years: 20.00    Types: Cigarettes   Smokeless tobacco: Never  Vaping Use   Vaping Use: Never used  Substance Use Topics   Alcohol use: No   Drug use: No     No Known Allergies  Health Maintenance  Topic Date Due   Medicare Annual Wellness (AWV)  02/25/2023   DEXA SCAN  02/25/2023 (Originally [birth year])   COVID-19 Vaccine (4 - 2023-24 season) 03/03/2023 (Originally 05/01/2022)   Zoster Vaccines- Shingrix (1 of 2) 05/18/2023 (  Originally 04/08/2003)   Lung Cancer Screening  10/07/2023 (Originally 04/08/2003)   INFLUENZA VACCINE  04/01/2023   Colonoscopy  07/07/2023   MAMMOGRAM  09/02/2023   DTaP/Tdap/Td (2 - Td or Tdap) 09/28/2027   Pneumonia Vaccine 38+ Years old  Completed   Hepatitis C Screening  Completed   HPV VACCINES  Aged Out    Chart Review Today: ***  Review of Systems   Objective:   Vitals:   02/15/23 1049  BP: (!) 146/78  Pulse: 88  Resp: 16  Temp: 98.2 F (36.8 C)  TempSrc: Oral  SpO2: 95%  Weight: 114 lb 4.8 oz (51.8 kg)  Height: 5\' 4"  (1.626 m)    Body mass index is 19.62 kg/m.  Physical Exam      Assessment & Plan:   Problem List Items Addressed This Visit       Other   Anticoagulated on Coumadin - Primary     No follow-ups on file.   Danelle Berry, PA-C 02/15/23 11:14 AM

## 2023-02-16 LAB — CBC WITH DIFFERENTIAL/PLATELET
Absolute Monocytes: 512 cells/uL (ref 200–950)
Basophils Absolute: 19 cells/uL (ref 0–200)
Basophils Relative: 0.3 %
Eosinophils Absolute: 173 cells/uL (ref 15–500)
Eosinophils Relative: 2.7 %
HCT: 35.2 % (ref 35.0–45.0)
Hemoglobin: 11.6 g/dL — ABNORMAL LOW (ref 11.7–15.5)
Lymphs Abs: 2950 cells/uL (ref 850–3900)
MCH: 29.7 pg (ref 27.0–33.0)
MCHC: 33 g/dL (ref 32.0–36.0)
MCV: 90 fL (ref 80.0–100.0)
MPV: 9.1 fL (ref 7.5–12.5)
Monocytes Relative: 8 %
Neutro Abs: 2746 cells/uL (ref 1500–7800)
Neutrophils Relative %: 42.9 %
Platelets: 289 10*3/uL (ref 140–400)
RBC: 3.91 10*6/uL (ref 3.80–5.10)
RDW: 12.7 % (ref 11.0–15.0)
Total Lymphocyte: 46.1 %
WBC: 6.4 10*3/uL (ref 3.8–10.8)

## 2023-02-16 LAB — COMPLETE METABOLIC PANEL WITH GFR
AG Ratio: 1.4 (calc) (ref 1.0–2.5)
ALT: 16 U/L (ref 6–29)
AST: 24 U/L (ref 10–35)
Albumin: 4 g/dL (ref 3.6–5.1)
Alkaline phosphatase (APISO): 69 U/L (ref 37–153)
BUN/Creatinine Ratio: 5 (calc) — ABNORMAL LOW (ref 6–22)
BUN: 4 mg/dL — ABNORMAL LOW (ref 7–25)
CO2: 30 mmol/L (ref 20–32)
Calcium: 9.7 mg/dL (ref 8.6–10.4)
Chloride: 104 mmol/L (ref 98–110)
Creat: 0.78 mg/dL (ref 0.50–1.05)
Globulin: 2.9 g/dL (calc) (ref 1.9–3.7)
Glucose, Bld: 92 mg/dL (ref 65–99)
Potassium: 4.3 mmol/L (ref 3.5–5.3)
Sodium: 141 mmol/L (ref 135–146)
Total Bilirubin: 0.4 mg/dL (ref 0.2–1.2)
Total Protein: 6.9 g/dL (ref 6.1–8.1)
eGFR: 82 mL/min/{1.73_m2} (ref >=60)

## 2023-02-17 ENCOUNTER — Telehealth: Payer: Self-pay | Admitting: Family Medicine

## 2023-02-17 NOTE — Telephone Encounter (Signed)
This was an error as the patient found out that a monitoring system had in fact been called into her pharmacy at [facility]. She states they are impossible to get a hold of and that is why she called her provider.

## 2023-02-18 ENCOUNTER — Other Ambulatory Visit (HOSPITAL_COMMUNITY): Payer: Self-pay

## 2023-02-18 ENCOUNTER — Encounter: Payer: Self-pay | Admitting: Family Medicine

## 2023-02-18 ENCOUNTER — Telehealth: Payer: Self-pay | Admitting: Family Medicine

## 2023-02-18 NOTE — Telephone Encounter (Signed)
[facility] called and spoke to [place], Pensions consultant about the refill(s) clopidogrel requested. Advised it was sent on 02/15/23 #90/1 refill(s). She says it's on hold because the insurance is saying it was refilled by a mail order pharmacy. In the chart it says it was sent to [facility] on 02/08/23. I called and spoke to [facility] Pensions consultant and she says it was cancelled before they could refill and they are a discharge pharmacy inside the hospital. [facility] called and spoke to [place], Pensions consultant and advised of the above. She says 03/05/23 is the earliest that the insurance will pay and if the patient wanted to purchase it would be $35 for a 90 day and $15 for a 30 day supply. I asked if she would call the patient to let her know so that she can decide what to do, since she has 10 pills remaining. She says she will call the patient.

## 2023-02-18 NOTE — Telephone Encounter (Signed)
Patient has called back & still not able to get prescription below, per pharmacy is telling patient they need clarification regarding the monitor.   Patients callback #:  [phone]  Blood Glucose Monitoring Suppl DEVI   Glucose Blood (BLOOD GLUCOSE TEST STRIPS) STRP      [facility] [address] [place], [place] - [address] Phone: [phone]  Fax: [phone]

## 2023-02-18 NOTE — Telephone Encounter (Signed)
Medication Refill - Medication: clopidogrel (PLAVIX) 75 MG tablet (patient stated she only has 10 pills left)  Has the patient contacted their pharmacy? No.  Preferred Pharmacy (with phone number or street name):  [facility] [address] [place], [place] - [address] Phone: [phone]  Fax: [phone]     Has the patient been seen for an appointment in the last year OR does the patient have an upcoming appointment? Yes.     Agent: Please be advised that RX refills may take up to 3 business days. We ask that you follow-up with your pharmacy.

## 2023-02-18 NOTE — Telephone Encounter (Signed)
Patient called and advised clopidogrel was sent on 02/15/23 to the pharmacy. She says the only thing that they said was received was the glucose monitoring and they said it was something wrong on the Rx for that. I advised I would call the pharmacy. [facility] called, long hold time when transferred to the pharmacy with no answer. Will attempt at a later time.

## 2023-02-18 NOTE — Telephone Encounter (Signed)
Called pharmacy to ask what was going on: Per pharmacy staff they only need her current insurance in file to run through her BS supplies and meter. I called pt advised to call or go to pharmacy. Verbalized understanding

## 2023-02-24 ENCOUNTER — Other Ambulatory Visit: Payer: Self-pay | Admitting: Radiology

## 2023-02-24 DIAGNOSIS — I771 Stricture of artery: Secondary | ICD-10-CM

## 2023-02-25 ENCOUNTER — Other Ambulatory Visit: Payer: Self-pay

## 2023-02-25 ENCOUNTER — Encounter (HOSPITAL_COMMUNITY): Payer: Self-pay | Admitting: Interventional Radiology

## 2023-02-25 NOTE — Progress Notes (Signed)
Spoke with pt for pre-op call. Pt denies cardiac history. Pt is treated for HTN and has had a stroke. Hx of PAD. Pt states she pre-diabetic. She states she does check her blood sugar at home and she states they "run low", 70 or less. Instructed pt to check her blood sugar Monday AM. If blood sugar is 70 or below, treat with 1/2 cup of clear juice (apple or cranberry) and recheck blood sugar 15 minutes after drinking juice. If blood sugar continues to be 70 or below, notify your nurse on arrival to Short Stay.  Shower instructions given to pt.

## 2023-02-25 NOTE — Progress Notes (Deleted)
Subjective:   Mary [name] is a 72 y.o. female who presents for Medicare Annual (Subsequent) preventive examination.  Review of Systems:  ***       Objective:     Vitals: There were no vitals taken for this visit.  There is no height or weight on file to calculate BMI.     02/02/2023   12:40 PM 01/29/2023    9:27 PM 09/29/2021    2:22 PM 02/20/2021   10:05 AM 02/20/2020   10:24 AM 02/16/2019    9:54 AM 07/06/2018    7:33 AM  Advanced Directives  Does Patient Have a Medical Advance Directive? No No No No No No No  Would patient like information on creating a medical advance directive? No - Patient declined No - Patient declined  No - Patient declined Yes (MAU/Ambulatory/Procedural Areas - Information given) No - Patient declined No - Patient declined    Tobacco Social History   Tobacco Use  Smoking Status Every Day   Packs/day: 0.50   Years: 40.00   Additional pack years: 0.00   Total pack years: 20.00   Types: Cigarettes  Smokeless Tobacco Never     Ready to quit: Not Answered Counseling given: Not Answered   Clinical Intake:                       Past Medical History:  Diagnosis Date   Arthritis    Hyperlipidemia    Hypertension    Hyperuricemia    Osteoporosis    PAD (peripheral artery disease) (HCC) 02/2010   angiplasty, popliteal artery   Tobacco abuse    Past Surgical History:  Procedure Laterality Date   ANGIOPLASTY  July 2011   PAD- popliteal artery   COLONOSCOPY WITH PROPOFOL N/A 01/13/2018   Procedure: COLONOSCOPY WITH PROPOFOL;  Surgeon: Toney Reil, MD;  Location: [facility];  Service: Gastroenterology;  Laterality: N/A;   COLONOSCOPY WITH PROPOFOL N/A 01/14/2018   Procedure: COLONOSCOPY WITH PROPOFOL;  Surgeon: Pasty Spillers, MD;  Location: [facility];  Service: Endoscopy;  Laterality: N/A;   COLONOSCOPY WITH PROPOFOL N/A 07/06/2018   Procedure: COLONOSCOPY WITH PROPOFOL;  Surgeon: Pasty Spillers, MD;   Location: [facility];  Service: Endoscopy;  Laterality: N/A;   CORONARY ANGIOPLASTY     IR CT HEAD LTD  01/27/2023   IR PERCUTANEOUS ART THROMBECTOMY/INFUSION INTRACRANIAL INC DIAG ANGIO  01/27/2023   JOINT REPLACEMENT     Leg Bypass Surgery Right 08/2010   OVARIAN CYST SURGERY  1980s   POPLITEAL ARTERY STENT Right 05/2010   POPLITEAL ARTERY STENT Right 07/2010   RADIOLOGY WITH ANESTHESIA N/A 01/27/2023   Procedure: IR WITH ANESTHESIA;  Surgeon: Radiologist, Medication, MD;  Location: [facility];  Service: Radiology;  Laterality: N/A;   TOTAL HIP ARTHROPLASTY Left 10/27/2017   Procedure: TOTAL HIP ARTHROPLASTY;  Surgeon: Deeann Saint, MD;  Location: [facility];  Service: Orthopedics;  Laterality: Left;   Family History  Problem Relation Age of Onset   Arthritis Mother    Diabetes Mother    Stroke Mother    Hypertension Mother    Dementia Mother    Cancer Father    Arthritis Sister    Seizures Sister    Diabetes Sister    Hypertension Sister    COPD Sister    Cancer Brother    Stroke Maternal Grandmother    Diabetes Paternal Grandmother    Cancer Sister    Diabetes Sister  Arthritis Sister    Diabetes Brother    Stroke Brother    Healthy Brother    Diabetes Brother    Stroke Brother    Cancer Brother    Heart disease Neg Hx    Breast cancer Neg Hx    Social History   Socioeconomic History   Marital status: Married    Spouse name: Joien   Number of children: 0   Years of education: Not on file   Highest education level: 12th grade  Occupational History   Occupation: Disabled  Tobacco Use   Smoking status: Every Day    Packs/day: 0.50    Years: 40.00    Additional pack years: 0.00    Total pack years: 20.00    Types: Cigarettes   Smokeless tobacco: Never  Vaping Use   Vaping Use: Never used  Substance and Sexual Activity   Alcohol use: No   Drug use: No   Sexual activity: Yes  Other Topics Concern   Not on file  Social History Narrative   Not on file    Social Determinants of Health   Financial Resource Strain: Low Risk  (02/24/2022)   Overall Financial Resource Strain (CARDIA)    Difficulty of Paying Living Expenses: Not hard at all  Food Insecurity: No Food Insecurity (01/29/2023)   Hunger Vital Sign    Worried About Running Out of Food in the Last Year: Never true    Ran Out of Food in the Last Year: Never true  Transportation Needs: No Transportation Needs (01/29/2023)   PRAPARE - Administrator, Civil Service (Medical): No    Lack of Transportation (Non-Medical): No  Physical Activity: Sufficiently Active (02/24/2022)   Exercise Vital Sign    Days of Exercise per Week: 5 days    Minutes of Exercise per Session: 30 min  Stress: No Stress Concern Present (02/24/2022)   [employer] of Occupational Health - Occupational Stress Questionnaire    Feeling of Stress : Only a little  Social Connections: Socially Isolated (02/24/2022)   Social Connection and Isolation Panel [NHANES]    Frequency of Communication with Friends and Family: Once a week    Frequency of Social Gatherings with Friends and Family: Once a week    Attends Religious Services: Never    Database administrator or Organizations: No    Attends Banker Meetings: Never    Marital Status: Married    Outpatient Encounter Medications as of 02/26/2023  Medication Sig   acetaminophen (TYLENOL) 325 MG tablet Take 2 tablets (650 mg total) by mouth every 4 (four) hours as needed for mild pain (or temp > 37.5 C (99.5 F)).   amLODipine (NORVASC) 5 MG tablet Take 1 tablet (5 mg total) by mouth daily.   aspirin 81 MG chewable tablet Chew 1 tablet (81 mg total) by mouth daily.   atorvastatin (LIPITOR) 10 MG tablet Take 1 tablet (10 mg total) by mouth at bedtime.   Blood Glucose Monitoring Suppl DEVI 1 each by Does not apply route daily as needed. May substitute to any manufacturer covered by patient's insurance.   cholecalciferol (VITAMIN D3) 25 MCG (1000  UNIT) tablet Take 2 tablets (2,000 Units total) by mouth daily.   clopidogrel (PLAVIX) 75 MG tablet Take 1 tablet (75 mg total) by mouth daily.   Glucose Blood (BLOOD GLUCOSE TEST STRIPS) STRP Use strips with glucometer daily as needed for blood sugar monitoring.  May substitute to any manufacturer covered  by patient's insurance.   Lancets Misc. MISC Use lancets with glucometer daily prn for blood sugar monitoring.  May substitute to any manufacturer covered by patient's insurance.   nicotine (NICODERM CQ - DOSED IN MG/24 HR) 7 mg/24hr patch 7 mg patch daily x 1 week and stop (Patient not taking: Reported on 02/24/2023)   No facility-administered encounter medications on file as of 02/26/2023.    Activities of Daily Living    02/15/2023   10:49 AM 02/02/2023   12:43 PM  In your present state of health, do you have any difficulty performing the following activities:  Hearing? 0   Vision? 0   Difficulty concentrating or making decisions? 0   Walking or climbing stairs? 0   Dressing or bathing? 0   Doing errands, shopping? 0 1    Patient Care Team: Danelle Berry, PA-C as PCP - General (Family Medicine) Wyn Quaker, Marlow Baars, MD as Referring Physician (Vascular Surgery) Gaspar Cola, [facility] (Inactive) (Pharmacist)    Assessment:   This is a routine wellness examination for Brecken.  Exercise Activities and Dietary recommendations     Goals Addressed   None     Fall Risk:    02/15/2023   10:48 AM 01/04/2023   11:01 AM 10/06/2022    9:46 AM 07/06/2022   10:31 AM 06/08/2022   11:09 AM  Fall Risk   Falls in the past year? 1 0 0 0 0  Number falls in past yr: 1 0 0 0 0  Injury with Fall? 1 0 0 0 0  Risk for fall due to : Impaired balance/gait No Fall Risks No Fall Risks    Follow up Falls prevention discussed;Education provided;Falls evaluation completed Falls prevention discussed;Education provided;Falls evaluation completed Falls prevention discussed;Education provided;Falls evaluation  completed Falls evaluation completed Falls evaluation completed    FALL RISK PREVENTION PERTAINING TO THE HOME:  Any stairs in or around the home? {YES/NO:21197} If so, are there any without handrails? {YES/NO:21197}  Home free of loose throw rugs in walkways, pet beds, electrical cords, etc? {YES/NO:21197} Adequate lighting in your home to reduce risk of falls? {YES/NO:21197}  ASSISTIVE DEVICES UTILIZED TO PREVENT FALLS:  Life alert? {YES/NO:21197} Use of a cane, walker or w/c? {YES/NO:21197} Grab bars in the bathroom? {YES/NO:21197} Shower chair or bench in shower? {YES/NO:21197} Elevated toilet seat or a handicapped toilet? {YES/NO:21197}  DME ORDERS:  DME order needed?  {YES/NO:21197}  TIMED UP AND GO:  Was the test performed? {YES/NO:21197}.  Length of time to ambulate 10 feet: *** sec.   GAIT:  Appearance of gait: Gait steady and fast*** with/without the use of an assistive device. OR Gait slow and steady *** with/without the use of an assistive device.  Education: Fall risk prevention has been discussed.  Intervention(s) required? {YES/NO:21197}  DME/home health order needed?  {YES/NO:21197}   Depression Screen    02/15/2023   10:49 AM 01/04/2023   11:01 AM 10/06/2022    9:46 AM 07/06/2022   10:31 AM  PHQ 2/9 Scores  PHQ - 2 Score 0 0 0 0  PHQ- 9 Score 0 0 0      Cognitive Function        02/20/2020   10:26 AM 02/16/2019    9:58 AM 02/10/2018   10:28 AM  6CIT Screen  What Year? 0 points 0 points 0 points  What month? 0 points 0 points 0 points  What time? 0 points 0 points 0 points  Count back from 20 0 points  0 points 0 points  Months in reverse 0 points 0 points 2 points  Repeat phrase 0 points 0 points 2 points  Total Score 0 points 0 points 4 points    Immunization History  Administered Date(s) Administered   Moderna Sars-Covid-2 Vaccination 10/28/2019, 11/25/2019   PFIZER(Purple Top)SARS-COV-2 Vaccination 08/11/2021   PNEUMOCOCCAL CONJUGATE-20  10/06/2022   Tdap 09/27/2017    Qualifies for Shingles Vaccine? {YES/NO:21197} Zostavax completed ***. Due for Shingrix. Education has been provided regarding the importance of this vaccine. Pt has been advised to call insurance company to determine out of pocket expense. Advised may also receive vaccine at local pharmacy or Health Dept. Verbalized acceptance and understanding.  Tdap: Although this vaccine is not a covered service during a Wellness Exam, does the patient still wish to receive this vaccine today?  {YES/NO:21197}.  Education has been provided regarding the importance of this vaccine. Advised may receive this vaccine at local pharmacy or Health Dept. Aware to provide a copy of the vaccination record if obtained from local pharmacy or Health Dept. Verbalized acceptance and understanding.  Flu Vaccine: Due for Flu vaccine. Does the patient want to receive this vaccine today?  {YES/NO:21197}. Education has been provided regarding the importance of this vaccine but still declined. Advised may receive this vaccine at local pharmacy or Health Dept. Aware to provide a copy of the vaccination record if obtained from local pharmacy or Health Dept. Verbalized acceptance and understanding.  Pneumococcal Vaccine: Due for Pneumococcal vaccine. Does the patient want to receive this vaccine today?  {YES/NO:21197}. Education has been provided regarding the importance of this vaccine but still declined. Advised may receive this vaccine at local pharmacy or Health Dept. Aware to provide a copy of the vaccination record if obtained from local pharmacy or Health Dept. Verbalized acceptance and understanding.   Covid-19 Vaccine: *** Information provided/ Completed vaccines  Screening Tests Health Maintenance  Topic Date Due   Medicare Annual Wellness (AWV)  02/25/2023   DEXA SCAN  02/25/2023 (Originally 1950/11/08)   COVID-19 Vaccine (4 - 2023-24 season) 03/03/2023 (Originally 05/01/2022)   Zoster  Vaccines- Shingrix (1 of 2) 05/18/2023 (Originally 04/08/2003)   Lung Cancer Screening  10/07/2023 (Originally 04/08/2003)   INFLUENZA VACCINE  04/01/2023   Colonoscopy  07/07/2023   MAMMOGRAM  09/02/2023   DTaP/Tdap/Td (2 - Td or Tdap) 09/28/2027   Pneumonia Vaccine 65+ Years old  Completed   Hepatitis C Screening  Completed   HPV VACCINES  Aged Out    Cancer Screenings:  Colorectal Screening: Completed ***. Repeat every *** years; No longer required. Referral to GI placed ***. Pt aware the office will call re: appt.  Mammogram: Completed ***. Repeat every year; No longer required. Ordered ***. Pt provided with contact info and advised to call to schedule appt. Pt aware the office will call re: appt.  Bone Density: Completed ***. Results reflect NORMAL, OSTEOPENIA, OSTEOPOROSIS. Repeat every 2 years. Ordered ***. Pt provided with contact info and advised to call to schedule appt. Pt aware the office will call re: appt.  Lung Cancer Screening: (Low Dose CT Chest recommended if Age 22-80 years, 30 pack-year currently smoking OR have quit w/in 15years.) {DOES NOT does:27190::"does not"} qualify.   Lung Cancer Screening Referral: An Epic message has been sent to Glenna Fellows, RN (Oncology Nurse Navigator) regarding the possible need for this exam. Ines Bloomer will review the patient's chart to determine if the patient truly qualifies for the exam. If the patient qualifies, Ines Bloomer will order  the Low Dose CT of the chest to facilitate the scheduling of this exam.  Additional Screening:  Hepatitis C Screening: {DOES NOT does:27190::"does not"} qualify; Completed ***  Vision Screening: Recommended annual ophthalmology exams for early detection of glaucoma and other disorders of the eye. Is the patient up to date with their annual eye exam?  {YES/NO:21197} Who is the provider or what is the name of the office in which the pt attends annual eye exams? *** If pt is not established with a provider, would  they like to be referred to a provider to establish care? {YES/NO:21197}. Ophthalmology referral has been placed. Pt aware the office will call re: appt.  Dental Screening: Recommended annual dental exams for proper oral hygiene  Community Resource Referral:  CRR required this visit?  {YES/NO:21197}      Plan:  I have personally reviewed and addressed the Medicare Annual Wellness questionnaire and have noted the following in the patient's chart:  A. Medical and social history B. Use of alcohol, tobacco or illicit drugs  C. Current medications and supplements D. Functional ability and status E.  Nutritional status F.  Physical activity G. Advance directives H. List of other physicians I.  Hospitalizations, surgeries, and ER visits in previous 12 months J.  Vitals K. Screenings such as hearing and vision if needed, cognitive and depression L. Referrals and appointments   In addition, I have reviewed and discussed with patient certain preventive protocols, quality metrics, and best practice recommendations. A written personalized care plan for preventive services as well as general preventive health recommendations were provided to patient.  Signed,    Berniece Salines, FNP  02/25/2023 Nurse Health Advisor   Nurse Notes:

## 2023-02-26 ENCOUNTER — Other Ambulatory Visit: Payer: Self-pay | Admitting: Internal Medicine

## 2023-02-26 ENCOUNTER — Ambulatory Visit: Payer: Medicare PPO | Admitting: Nurse Practitioner

## 2023-02-26 NOTE — Anesthesia Preprocedure Evaluation (Addendum)
Anesthesia Evaluation  Patient identified by MRN, date of birth, ID band Patient awake    Reviewed: Allergy & Precautions, NPO status , Patient's Chart, lab work & pertinent test results, reviewed documented beta blocker date and time   History of Anesthesia Complications Negative for: history of anesthetic complications  Airway Mallampati: II  TM Distance: >3 FB Neck ROM: Full    Dental  (+) Missing, Poor Dentition, Dental Advisory Given   Pulmonary Current Smoker and Patient abstained from smoking.   Pulmonary exam normal breath sounds clear to auscultation       Cardiovascular hypertension, Pt. on medications (-) angina + CAD and + Peripheral Vascular Disease  Normal cardiovascular exam Rhythm:Regular Rate:Normal  Right ICA stenosis S/P mechanical thrombectomy 01/27/23  EKG 01/27/23 NSR, PAC's  Echo 01/28/23 1. Left ventricular ejection fraction, by estimation, is 55 to 60%. The  left ventricle has normal function. The left ventricle has no regional  wall motion abnormalities. Left ventricular diastolic parameters are  consistent with Grade I diastolic  dysfunction (impaired relaxation).   2. Right ventricular systolic function is normal. The right ventricular  size is normal. Tricuspid regurgitation signal is inadequate for assessing  PA pressure.   3. The mitral valve is grossly normal. Trivial mitral valve  regurgitation.   4. The aortic valve is tricuspid. There is mild calcification of the  aortic valve. There is mild thickening of the aortic valve. Aortic valve  regurgitation is not visualized. Aortic valve sclerosis/calcification is  present, without any evidence of  aortic stenosis.   5. The inferior vena cava is normal in size with greater than 50%  respiratory variability, suggesting right atrial pressure of 3 mmHg.     Neuro/Psych CVA, Residual Symptoms  negative psych ROS   GI/Hepatic negative GI ROS, Neg  liver ROS,,,  Endo/Other  negative endocrine ROS  HLD Pre diabetes  Renal/GU negative Renal ROS  negative genitourinary   Musculoskeletal  (+) Arthritis , Osteoarthritis,    Abdominal   Peds  Hematology  (+) Blood dyscrasia, anemia Plavix therapy   Anesthesia Other Findings   Reproductive/Obstetrics                              Anesthesia Physical Anesthesia Plan  ASA: 3  Anesthesia Plan: General   Post-op Pain Management: Minimal or no pain anticipated   Induction: Intravenous  PONV Risk Score and Plan: 2 and Treatment may vary due to age or medical condition and Ondansetron  Airway Management Planned: Oral ETT  Additional Equipment: Arterial line  Intra-op Plan:   Post-operative Plan: Extubation in OR  Informed Consent: I have reviewed the patients History and Physical, chart, labs and discussed the procedure including the risks, benefits and alternatives for the proposed anesthesia with the patient or authorized representative who has indicated his/her understanding and acceptance.     Dental advisory given and Only emergency history available  Plan Discussed with: CRNA and Surgeon  Anesthesia Plan Comments: (PAT note written 02/26/2023 by Shonna Chock, PA-C.  )         Anesthesia Quick Evaluation

## 2023-02-26 NOTE — Progress Notes (Signed)
Anesthesia Chart Review: Mary [name]  Case: [id number] Date/Time: 03/01/23 0730   Procedure: Right carotid stent (Right)   Anesthesia type: General   Pre-op diagnosis: internal carotid artery right occlusion   [facility]   Surgeons: Julieanne Cotton, MD       DISCUSSION: Patient is a 71 year old female scheduled for the above procedure. S/p acute CVA 01/27/23. CTA showed mural adherent clot in the right ICA after the bifurcation resulting in high grade stenosis and mild plaque in the left carotid bifurcation. Not a TNK candidate due warfarin with INR 2.4. to S/p emergent mechanical thrombectomy of large vessel occlusion with finding of ~ 65% stenosis of the right ICA at the bulb with recommendation of right ICA stent to prevent future ischemic strokes in the right MCA distribution. She was discharged to [facility] for rehab 02/02/23-02/09/23.  History includes smoking, HTN, HLD, prediabetes, CVA (01/27/23; right MCA/ACA from RICA stenosis/thrombus, s/p mechanical thrombectomy), PAD (right SFA & popliteal stents ~ 2012; PTA right CFA 10/26/11; thrombolysis, mechanical thrombectomy & PTA right CFA & profunda femoris 01/21/12; right SFA and popliteal artery stents occluded by 11/22/19 [place]), anemia, osteoarthritis (left THA 10/27/17).  She was evaluated by [facility] cardiologist Dr. Dorothyann Peng in 2016 for chest pain evaluation with known smoking and PAD. Notes does not mention any history of coronary PCI (just PAD interventions). She had a normal myocardial perfusion scan, EF 61% on 09/26/14 and normal echo at that time. Last visit 10/03/14. More recent, she had an echo during her CVA admission. 01/28/23 echo showed LVEF 55-60%, no regional wall motion abnormalities, grade 1 diastolic dysfunction, normal RV systolic function, trivial MR.  She was warfarin at the time of her CVA for PAD. Given carotid intramural thrombus in setting of therapeutic INR, warfarin discontinued and transitioned  to Plavix and ASA. She was instructed to continue for procedure.  Anesthesia team to evaluate on the day of surgery.   VS:  BP Readings from Last 3 Encounters:  02/15/23 (!) 144/72  02/09/23 112/73  02/02/23 134/83   Pulse Readings from Last 3 Encounters:  02/15/23 88  02/09/23 76  02/02/23 76    PROVIDERS: Danelle Berry, PA-C is PCP  Levora Dredge, MD is vascular surgeon Delia Heady, MD is neurologist   LABS: Most recent lab results in [facility] include: Lab Results  Component Value Date   WBC 6.4 02/15/2023   HGB 11.6 (L) 02/15/2023   HCT 35.2 02/15/2023   PLT 289 02/15/2023   GLUCOSE 92 02/15/2023   CHOL 115 01/28/2023   TRIG 57 01/28/2023   HDL 51 01/28/2023   LDLCALC 53 01/28/2023   ALT 16 02/15/2023   AST 24 02/15/2023   NA 141 02/15/2023   K 4.3 02/15/2023   CL 104 02/15/2023   CREATININE 0.78 02/15/2023   BUN 4 (L) 02/15/2023   CO2 30 02/15/2023   INR 1.2 02/05/2023   HGBA1C 6.2 (H) 01/28/2023    IMAGES: MRI Brain 01/27/23: IMPRESSION: Scattered acute infarcts throughout the right cerebral hemisphere, with more focal areas of infarction in the right frontal and parietal cortex, watershed territory, right caudate and lentiform nucleus, and right occipital lobe.  IR CT Head/Emergent Large Vessel Occlusion Mechanical Thrombectomy 01/27/23: IMPRESSION: - Status post right ICA extra cranially with 1 pass with a 6.5 mm x 47 mm Emboshield retrieval device and aspiration achieving complete revascularization. - Approximately 65% stenosis of the right internal carotid artery at the bulb. PLAN: We will probably  need placement of a stent at the right internal carotid artery proximally to prevent future ischemic strokes in the right MCA distribution.  CT Head & CTA Head/Neck 01/27/23: IMPRESSION: 1. No acute intracranial pathology.  No evidence of evolved infarct. 2. Mural adherent clot in the right internal carotid artery after the bifurcation resulting  in high-grade stenosis, estimated at 80%. 3. More proximally in the right internal carotid artery just after the bifurcation, mixed plaque results in proximally 60% stenosis. 4. Mild plaque at the left carotid bifurcation without significant stenosis. Patent vertebral arteries in the neck. 5. Patent intracranial vasculature. 6. Emphysema.   EKG: 01/27/23: Sinus rhythm Atrial premature complexes Probable left atrial enlargement Confirmed by Gloris Manchester [phone]) on 01/27/2023 6:16:09 PM   CV: UE Venous US 02/02/23: Summary:  - Right:  No evidence of deep vein thrombosis in the upper extremity.  - Left:  No evidence of thrombosis in the subclavian. Findings consistent with age  indeterminate superficial vein thrombosis involving the left cephalic  vein.      Echo 01/28/23: IMPRESSIONS   1. Left ventricular ejection fraction, by estimation, is 55 to 60%. The  left ventricle has normal function. The left ventricle has no regional  wall motion abnormalities. Left ventricular diastolic parameters are  consistent with Grade I diastolic  dysfunction (impaired relaxation).   2. Right ventricular systolic function is normal. The right ventricular  size is normal. Tricuspid regurgitation signal is inadequate for assessing  PA pressure.   3. The mitral valve is grossly normal. Trivial mitral valve  regurgitation.   4. The aortic valve is tricuspid. There is mild calcification of the  aortic valve. There is mild thickening of the aortic valve. Aortic valve  regurgitation is not visualized. Aortic valve sclerosis/calcification is  present, without any evidence of  aortic stenosis.   5. The inferior vena cava is normal in size with greater than 50%  respiratory variability, suggesting right atrial pressure of 3 mmHg.  - Comparison(s): No prior Echocardiogram.  - Conclusion(s)/Recommendation(s): No intracardiac source of embolism  detected on this transthoracic study. Consider a  transesophageal  echocardiogram to exclude cardiac source of embolism if clinically  indicated.    US Carotid 01/28/23: Summary:  - Right Carotid: Velocities in the right ICA are consistent with a 1-39%  stenosis.  - Left Carotid: Velocities in the left ICA are consistent with a 1-39%  stenosis.  - Vertebrals: Bilateral vertebral arteries demonstrate antegrade flow.  - Subclavians: Normal flow hemodynamics were seen in bilateral subclavian               arteries.    Nuclear stress test 09/26/14 ([facility]): Normal myocardial perfusion scan no evidence of stress-induced  myocardial ischemia ejection fraction of 61%.    Past Medical History:  Diagnosis Date   Anemia    Arthritis    Hyperlipidemia    Hypertension    Hyperuricemia    Osteoporosis    PAD (peripheral artery disease) (HCC) 02/2010   angiplasty, popliteal artery   Pre-diabetes    Stroke [facility])    Tobacco abuse     Past Surgical History:  Procedure Laterality Date   ANGIOPLASTY  July 2011   PAD- popliteal artery   COLONOSCOPY WITH PROPOFOL N/A 01/13/2018   Procedure: COLONOSCOPY WITH PROPOFOL;  Surgeon: Toney Reil, MD;  Location: [facility] ENDOSCOPY;  Service: Gastroenterology;  Laterality: N/A;   COLONOSCOPY WITH PROPOFOL N/A 01/14/2018   Procedure: COLONOSCOPY WITH PROPOFOL;  Surgeon: Pasty Spillers,  MD;  Location: [facility] ENDOSCOPY;  Service: Endoscopy;  Laterality: N/A;   COLONOSCOPY WITH PROPOFOL N/A 07/06/2018   Procedure: COLONOSCOPY WITH PROPOFOL;  Surgeon: Pasty Spillers, MD;  Location: [facility] ENDOSCOPY;  Service: Endoscopy;  Laterality: N/A;   CORONARY ANGIOPLASTY     IR CT HEAD LTD  01/27/2023   IR PERCUTANEOUS ART THROMBECTOMY/INFUSION INTRACRANIAL INC DIAG ANGIO  01/27/2023   JOINT REPLACEMENT     Leg Bypass Surgery Right 08/2010   OVARIAN CYST SURGERY  1980s   POPLITEAL ARTERY STENT Right 05/2010   POPLITEAL ARTERY STENT Right 07/2010   RADIOLOGY WITH ANESTHESIA N/A 01/27/2023   Procedure: IR  WITH ANESTHESIA;  Surgeon: Radiologist, Medication, MD;  Location: [facility] OR;  Service: Radiology;  Laterality: N/A;   TOTAL HIP ARTHROPLASTY Left 10/27/2017   Procedure: TOTAL HIP ARTHROPLASTY;  Surgeon: Deeann Saint, MD;  Location: [facility] ORS;  Service: Orthopedics;  Laterality: Left;    MEDICATIONS: No current facility-administered medications for this encounter.    acetaminophen (TYLENOL) 325 MG tablet   amLODipine (NORVASC) 5 MG tablet   aspirin 81 MG chewable tablet   atorvastatin (LIPITOR) 10 MG tablet   cholecalciferol (VITAMIN D3) 25 MCG (1000 UNIT) tablet   clopidogrel (PLAVIX) 75 MG tablet   Blood Glucose Monitoring Suppl DEVI   Glucose Blood (BLOOD GLUCOSE TEST STRIPS) STRP   Lancets Misc. MISC   nicotine (NICODERM CQ - DOSED IN MG/24 HR) 7 mg/24hr patch    Shonna Chock, PA-C Surgical Short Stay/Anesthesiology [facility] Phone [phone] [facility] Phone [phone] 02/26/2023 1:54 PM

## 2023-02-28 ENCOUNTER — Other Ambulatory Visit: Payer: Self-pay | Admitting: Student

## 2023-02-28 NOTE — H&P (Signed)
Chief Complaint: Patient was seen in consultation today for carotid stent, internal carotid artery occlusion  Supervising Physician: Julieanne Cotton  Patient Status: [facility] - Out-pt  History of Present Illness: Mary [name] is a 72 y.o. female with past medical history of PAD, CAD, tobacco use, HTN who presented to [facility] ED 01/27/23 with left-sided weakness, right gaze preference, left-sided neglect.  She was found to have ischemic stroke with right ICA stenosis. She underwent right ICA thrombectomy 01/29/23 by Dr. Corliss Skains with recanalization, however ongoing 65% arterial stenosis. She was discharged to inpatient rehab for recovery and returns for ongoing evaluation and management of her intracranial stenosis with possible stent placement.   Mary [name] presents today in her usual state of health.  She does*** have ongoing deficits related to her prior stroke.  She is accompanied by her *** today.  She understands the risks, benefits, and goals of her treatment plan today and is agreeable to proceed.    She has been taking aspirin and Plavix since discharge.   Her BP today is ***  She has no known allergies.    Past Medical History:  Diagnosis Date   Anemia    Arthritis    Hyperlipidemia    Hypertension    Hyperuricemia    Osteoporosis    PAD (peripheral artery disease) (HCC) 02/2010   angiplasty, popliteal artery   Pre-diabetes    Stroke [facility])    Tobacco abuse     Past Surgical History:  Procedure Laterality Date   ANGIOPLASTY  July 2011   PAD- popliteal artery   COLONOSCOPY WITH PROPOFOL N/A 01/13/2018   Procedure: COLONOSCOPY WITH PROPOFOL;  Surgeon: Toney Reil, MD;  Location: [facility] ENDOSCOPY;  Service: Gastroenterology;  Laterality: N/A;   COLONOSCOPY WITH PROPOFOL N/A 01/14/2018   Procedure: COLONOSCOPY WITH PROPOFOL;  Surgeon: Pasty Spillers, MD;  Location: [facility] ENDOSCOPY;  Service: Endoscopy;  Laterality: N/A;   COLONOSCOPY WITH PROPOFOL N/A 07/06/2018    Procedure: COLONOSCOPY WITH PROPOFOL;  Surgeon: Pasty Spillers, MD;  Location: [facility] ENDOSCOPY;  Service: Endoscopy;  Laterality: N/A;   CORONARY ANGIOPLASTY     IR CT HEAD LTD  01/27/2023   IR PERCUTANEOUS ART THROMBECTOMY/INFUSION INTRACRANIAL INC DIAG ANGIO  01/27/2023   JOINT REPLACEMENT     Leg Bypass Surgery Right 08/2010   OVARIAN CYST SURGERY  1980s   POPLITEAL ARTERY STENT Right 05/2010   POPLITEAL ARTERY STENT Right 07/2010   RADIOLOGY WITH ANESTHESIA N/A 01/27/2023   Procedure: IR WITH ANESTHESIA;  Surgeon: Radiologist, Medication, MD;  Location[facility] OR;  Service: Radiology;  Laterality: N/A;   TOTAL HIP ARTHROPLASTY Left 10/27/2017   Procedure: TOTAL HIP ARTHROPLASTY;  Surgeon: Deeann Saint, MD;  Location: [facility] ORS;  Service: Orthopedics;  Laterality: Left;    Allergies: Patient has no known allergies.  Medications: Prior to Admission medications   Medication Sig Start Date End Date Taking? Authorizing Provider  acetaminophen (TYLENOL) 325 MG tablet Take 2 tablets (650 mg total) by mouth every 4 (four) hours as needed for mild pain (or temp > 37.5 C (99.5 F)). 02/05/23   Angiulli, Mcarthur Rossetti, PA-C  amLODipine (NORVASC) 5 MG tablet Take 1 tablet (5 mg total) by mouth daily. 02/08/23   Angiulli, Mcarthur Rossetti, PA-C  aspirin 81 MG chewable tablet Chew 1 tablet (81 mg total) by mouth daily. 02/03/23   de Saintclair Halsted, Cortney E, NP  atorvastatin (LIPITOR) 10 MG tablet Take 1 tablet (10 mg total) by mouth at bedtime. 02/15/23  Danelle Berry, PA-C  Blood Glucose Monitoring Suppl DEVI 1 each by Does not apply route daily as needed. May substitute to any manufacturer covered by patient's insurance. 02/15/23   Danelle Berry, PA-C  cholecalciferol (VITAMIN D3) 25 MCG (1000 UNIT) tablet Take 2 tablets (2,000 Units total) by mouth daily. 02/08/23   Angiulli, Mcarthur Rossetti, PA-C  clopidogrel (PLAVIX) 75 MG tablet Take 1 tablet (75 mg total) by mouth daily. 02/15/23   Danelle Berry, PA-C  Glucose Blood (BLOOD  GLUCOSE TEST STRIPS) STRP Use strips with glucometer daily as needed for blood sugar monitoring.  May substitute to any manufacturer covered by patient's insurance. 02/15/23   Danelle Berry, PA-C  Lancets Misc. MISC Use lancets with glucometer daily prn for blood sugar monitoring.  May substitute to any manufacturer covered by patient's insurance. 02/15/23   Danelle Berry, PA-C  nicotine (NICODERM CQ - DOSED IN MG/24 HR) 7 mg/24hr patch 7 mg patch daily x 1 week and stop Patient not taking: Reported on 02/24/2023 02/08/23   Angiulli, Mcarthur Rossetti, PA-C     Family History  Problem Relation Age of Onset   Arthritis Mother    Diabetes Mother    Stroke Mother    Hypertension Mother    Dementia Mother    Cancer Father    Arthritis Sister    Seizures Sister    Diabetes Sister    Hypertension Sister    COPD Sister    Cancer Brother    Stroke Maternal Grandmother    Diabetes Paternal Grandmother    Cancer Sister    Diabetes Sister    Arthritis Sister    Diabetes Brother    Stroke Brother    Healthy Brother    Diabetes Brother    Stroke Brother    Cancer Brother    Heart disease Neg Hx    Breast cancer Neg Hx     Social History   Socioeconomic History   Marital status: Married    Spouse name: Joien   Number of children: 0   Years of education: Not on file   Highest education level: 12th grade  Occupational History   Occupation: Disabled  Tobacco Use   Smoking status: Every Day    Packs/day: 0.50    Years: 40.00    Additional pack years: 0.00    Total pack years: 20.00    Types: Cigarettes   Smokeless tobacco: Never   Tobacco comments:    Down to less than 1/2 pack per day as of 02/25/23  Vaping Use   Vaping Use: Never used  Substance and Sexual Activity   Alcohol use: No   Drug use: No   Sexual activity: Yes  Other Topics Concern   Not on file  Social History Narrative   Not on file   Social Determinants of Health   Financial Resource Strain: Low Risk  (02/24/2022)    Overall Financial Resource Strain (CARDIA)    Difficulty of Paying Living Expenses: Not hard at all  Food Insecurity: No Food Insecurity (01/29/2023)   Hunger Vital Sign    Worried About Running Out of Food in the Last Year: Never true    Ran Out of Food in the Last Year: Never true  Transportation Needs: No Transportation Needs (01/29/2023)   PRAPARE - Administrator, Civil Service (Medical): No    Lack of Transportation (Non-Medical): No  Physical Activity: Sufficiently Active (02/24/2022)   Exercise Vital Sign    Days of Exercise per  Week: 5 days    Minutes of Exercise per Session: 30 min  Stress: No Stress Concern Present (02/24/2022)   [employer] of Occupational Health - Occupational Stress Questionnaire    Feeling of Stress : Only a little  Social Connections: Socially Isolated (02/24/2022)   Social Connection and Isolation Panel [NHANES]    Frequency of Communication with Friends and Family: Once a week    Frequency of Social Gatherings with Friends and Family: Once a week    Attends Religious Services: Never    Database administrator or Organizations: No    Attends Engineer, structural: Never    Marital Status: Married     Review of Systems: A 12 point ROS discussed and pertinent positives are indicated in the HPI above.  All other systems are negative.  Review of Systems  Vital Signs: There were no vitals taken for this visit.  Physical Exam       Imaging: VAS [place] UPPER EXTREMITY VENOUS DUPLEX  Result Date: 02/02/2023 UPPER VENOUS STUDY  Patient Name:  JANILAH CZAR  Date of Exam:   02/02/2023 Medical Rec #: [id number]      Accession #:    [id number] Date of Birth: October 27, 1950       Patient Gender: F Patient Age:   35 years Exam Location:  [facility] Procedure:      VAS [place] UPPER EXTREMITY VENOUS DUPLEX Referring Phys: Dewitt Hoes DE LA TORRE --------------------------------------------------------------------------------  Indications: Swelling  Risk Factors: Trauma. Comparison Study: No prior studies. Performing Technologist: Chanda Busing RVT  Examination Guidelines: A complete evaluation includes B-mode imaging, spectral Doppler, color Doppler, and power Doppler as needed of all accessible portions of each vessel. Bilateral testing is considered an integral part of a complete examination. Limited examinations for reoccurring indications may be performed as noted.  Right Findings: +----------+------------+---------+-----------+----------+-----------------+ RIGHT     CompressiblePhasicitySpontaneousProperties     Summary      +----------+------------+---------+-----------+----------+-----------------+ IJV           Full       Yes       Yes                                +----------+------------+---------+-----------+----------+-----------------+ Subclavian    Full       Yes       Yes                                +----------+------------+---------+-----------+----------+-----------------+ Axillary      Full       Yes       Yes                                +----------+------------+---------+-----------+----------+-----------------+ Brachial      Full                                                    +----------+------------+---------+-----------+----------+-----------------+ Radial        Full                                                    +----------+------------+---------+-----------+----------+-----------------+  Ulnar         Full                                                    +----------+------------+---------+-----------+----------+-----------------+ Cephalic      None                                  Age Indeterminate +----------+------------+---------+-----------+----------+-----------------+ Basilic       Full                                                    +----------+------------+---------+-----------+----------+-----------------+ Thrombus located in the cephalic vein  is noted to be in the [facility] segment only.  Left Findings: +----------+------------+---------+-----------+----------+-------+ LEFT      CompressiblePhasicitySpontaneousPropertiesSummary +----------+------------+---------+-----------+----------+-------+ Subclavian    Full       Yes       Yes                      +----------+------------+---------+-----------+----------+-------+  Summary:  Right: No evidence of deep vein thrombosis in the upper extremity.  Left: No evidence of thrombosis in the subclavian. Findings consistent with age indeterminate superficial vein thrombosis involving the left cephalic vein.  *See table(s) above for measurements and observations.  Diagnosing physician: Waverly Ferrari MD Electronically signed by Waverly Ferrari MD on 02/02/2023 at 4:22:15 PM.    Final     Labs:  CBC: Recent Labs    01/27/23 1126 01/27/23 1128 01/28/23 0250 02/03/23 0743 02/15/23 1139  WBC 9.5  --  6.5 4.5 6.4  HGB 13.1 13.9 11.9* 12.3 11.6*  HCT 39.3 41.0 35.0* 35.7* 35.2  PLT 243  --  201 256 289    COAGS: Recent Labs    01/27/23 1126 01/28/23 0250 01/31/23 0353 02/01/23 0749 02/02/23 0636 02/05/23 0708  INR 2.4*   < > 2.7* 2.9* 7.2* 1.2  APTT 30  --   --   --   --   --    < > = values in this interval not displayed.    BMP: Recent Labs    01/27/23 1126 01/27/23 1128 01/28/23 0250 02/03/23 0743 02/15/23 1139  NA 136 139 134* 138 141  K 3.0* 2.9* 3.5 3.7 4.3  CL 101 99 101 103 104  CO2 25  --  23 26 30   GLUCOSE 127* 121* 113* 159* 92  BUN 9 10 8 13  4*  CALCIUM 9.0  --  8.4* 9.0 9.7  CREATININE 0.89 0.80 0.75 0.82 0.78  GFRNONAA >60  --  >60 >60  --     LIVER FUNCTION TESTS: Recent Labs    01/04/23 1127 01/27/23 1126 02/03/23 0743 02/15/23 1139  BILITOT 0.4 0.4 0.7 0.4  AST 20 24 31 24   ALT 10 16 21 16   ALKPHOS  --  70 58  --   PROT 7.1 7.4 6.9 6.9  ALBUMIN  --  3.8 3.4*  --     TUMOR MARKERS: No results for input(s): "AFPTM", "CEA",  "CA199", "CHROMGRNA" in the last 8760 hours.  Assessment and Plan: Patient with past medical history of R MCA stroke related  to right ICA stenosis presents for intervention of her stenosis.   Case reviewed by Dr. Diagnostic angiogram, possible stent who approves patient for procedure.  Patient presents today in their usual state of health.  She has been NPO and is not currently on blood thinners.   She has taken aspirin 81mg  and Plavix 75mg  daily as prescribed.  ***  She is aware of plans for admission post-procedure with anticipated discharge after overnight observation pending clinical course.   Risks and benefits of cerebral angiogram with intervention were discussed with the patient including, but not limited to bleeding, infection, vascular injury, contrast induced renal failure, stroke or even death.  This interventional procedure involves the use of X-rays and because of the nature of the planned procedure, it is possible that we will have prolonged use of X-ray fluoroscopy.  Potential radiation risks to you include (but are not limited to) the following: - A slightly elevated risk for cancer  several years later in life. This risk is typically less than 0.5% percent. This risk is low in comparison to the normal incidence of human cancer, which is 33% for women and 50% for men according to the American Cancer Society. - Radiation induced injury can include skin redness, resembling a rash, tissue breakdown / ulcers and hair loss (which can be temporary or permanent).   The likelihood of either of these occurring depends on the difficulty of the procedure and whether you are sensitive to radiation due to previous procedures, disease, or genetic conditions.   IF your procedure requires a prolonged use of radiation, you will be notified and given written instructions for further action.  It is your responsibility to monitor the irradiated area for the 2 weeks following the procedure and  to notify your physician if you are concerned that you have suffered a radiation induced injury.    All of the patient's questions were answered, patient is agreeable to proceed.  Consent signed and in chart.  Thank you for this interesting consult.  I greatly enjoyed meeting LANDI NEUVILLE and look forward to participating in their care.  A copy of this report was sent to the requesting provider on this date.  Electronically Signed: Hoyt Koch, PA 02/28/2023, 5:31 PM   I spent a total of  30 Minutes   in face to face in clinical consultation, greater than 50% of which was counseling/coordinating care for right ICA stenosis.

## 2023-03-01 ENCOUNTER — Encounter (HOSPITAL_COMMUNITY): Payer: Self-pay | Admitting: Interventional Radiology

## 2023-03-01 ENCOUNTER — Inpatient Hospital Stay (HOSPITAL_COMMUNITY)
Admission: RE | Admit: 2023-03-01 | Discharge: 2023-03-02 | DRG: 036 | Disposition: A | Discharge status: Home / Self Care | Payer: Medicare PPO | Attending: Interventional Radiology | Admitting: Interventional Radiology

## 2023-03-01 ENCOUNTER — Encounter (HOSPITAL_COMMUNITY)
Admission: RE | Disposition: A | Discharge status: Home / Self Care | Payer: Self-pay | Source: Home / Self Care | Attending: Interventional Radiology

## 2023-03-01 ENCOUNTER — Inpatient Hospital Stay (HOSPITAL_COMMUNITY): Payer: Medicare PPO | Admitting: Certified Registered Nurse Anesthetist

## 2023-03-01 ENCOUNTER — Inpatient Hospital Stay (HOSPITAL_COMMUNITY)
Admission: RE | Admit: 2023-03-01 | Discharge: 2023-03-01 | Disposition: A | Discharge status: Home / Self Care | Payer: Medicare PPO | Source: Ambulatory Visit | Attending: Interventional Radiology | Admitting: Interventional Radiology

## 2023-03-01 ENCOUNTER — Other Ambulatory Visit: Payer: Self-pay

## 2023-03-01 DIAGNOSIS — E785 Hyperlipidemia, unspecified: Secondary | ICD-10-CM | POA: Diagnosis present

## 2023-03-01 DIAGNOSIS — I6521 Occlusion and stenosis of right carotid artery: Principal | ICD-10-CM | POA: Diagnosis present

## 2023-03-01 DIAGNOSIS — Z7982 Long term (current) use of aspirin: Secondary | ICD-10-CM

## 2023-03-01 DIAGNOSIS — I739 Peripheral vascular disease, unspecified: Secondary | ICD-10-CM | POA: Diagnosis present

## 2023-03-01 DIAGNOSIS — Z7902 Long term (current) use of antithrombotics/antiplatelets: Secondary | ICD-10-CM

## 2023-03-01 DIAGNOSIS — Z96642 Presence of left artificial hip joint: Secondary | ICD-10-CM | POA: Diagnosis present

## 2023-03-01 DIAGNOSIS — Z833 Family history of diabetes mellitus: Secondary | ICD-10-CM

## 2023-03-01 DIAGNOSIS — Z79899 Other long term (current) drug therapy: Secondary | ICD-10-CM | POA: Diagnosis not present

## 2023-03-01 DIAGNOSIS — Z8249 Family history of ischemic heart disease and other diseases of the circulatory system: Secondary | ICD-10-CM

## 2023-03-01 DIAGNOSIS — F17210 Nicotine dependence, cigarettes, uncomplicated: Secondary | ICD-10-CM

## 2023-03-01 DIAGNOSIS — I2510 Atherosclerotic heart disease of native coronary artery without angina pectoris: Secondary | ICD-10-CM | POA: Diagnosis not present

## 2023-03-01 DIAGNOSIS — Z823 Family history of stroke: Secondary | ICD-10-CM | POA: Diagnosis not present

## 2023-03-01 DIAGNOSIS — M1990 Unspecified osteoarthritis, unspecified site: Secondary | ICD-10-CM | POA: Diagnosis present

## 2023-03-01 DIAGNOSIS — I358 Other nonrheumatic aortic valve disorders: Secondary | ICD-10-CM | POA: Diagnosis present

## 2023-03-01 DIAGNOSIS — Z87891 Personal history of nicotine dependence: Secondary | ICD-10-CM | POA: Diagnosis not present

## 2023-03-01 DIAGNOSIS — Z006 Encounter for examination for normal comparison and control in clinical research program: Secondary | ICD-10-CM

## 2023-03-01 DIAGNOSIS — R7303 Prediabetes: Secondary | ICD-10-CM | POA: Diagnosis present

## 2023-03-01 DIAGNOSIS — I10 Essential (primary) hypertension: Secondary | ICD-10-CM | POA: Diagnosis present

## 2023-03-01 DIAGNOSIS — Z8261 Family history of arthritis: Secondary | ICD-10-CM | POA: Diagnosis not present

## 2023-03-01 DIAGNOSIS — M810 Age-related osteoporosis without current pathological fracture: Secondary | ICD-10-CM | POA: Diagnosis present

## 2023-03-01 DIAGNOSIS — I771 Stricture of artery: Secondary | ICD-10-CM

## 2023-03-01 DIAGNOSIS — Z8673 Personal history of transient ischemic attack (TIA), and cerebral infarction without residual deficits: Secondary | ICD-10-CM | POA: Diagnosis not present

## 2023-03-01 DIAGNOSIS — I679 Cerebrovascular disease, unspecified: Secondary | ICD-10-CM | POA: Diagnosis not present

## 2023-03-01 DIAGNOSIS — Z95828 Presence of other vascular implants and grafts: Secondary | ICD-10-CM | POA: Diagnosis not present

## 2023-03-01 HISTORY — PX: IR INTRAVSC STENT CERV CAROTID W/EMB-PROT MOD SED INCL ANGIO: IMG2303

## 2023-03-01 HISTORY — DX: Prediabetes: R73.03

## 2023-03-01 HISTORY — PX: IR CT HEAD LTD: IMG2386

## 2023-03-01 HISTORY — DX: Anemia, unspecified: D64.9

## 2023-03-01 HISTORY — PX: IR ANGIO INTRA EXTRACRAN SEL COM CAROTID INNOMINATE UNI L MOD SED: IMG5358

## 2023-03-01 HISTORY — DX: Cerebral infarction, unspecified: I63.9

## 2023-03-01 HISTORY — PX: RADIOLOGY WITH ANESTHESIA: SHX6223

## 2023-03-01 HISTORY — PX: IR ANGIO INTRA EXTRACRAN SEL INTERNAL CAROTID UNI R MOD SED: IMG5362

## 2023-03-01 HISTORY — DX: Atherosclerotic heart disease of native coronary artery without angina pectoris: I25.10

## 2023-03-01 LAB — GLUCOSE, CAPILLARY
Glucose-Capillary: 103 mg/dL — ABNORMAL HIGH (ref 70–99)
Glucose-Capillary: 97 mg/dL (ref 70–99)
Glucose-Capillary: 140 mg/dL — ABNORMAL HIGH (ref 70–99)

## 2023-03-01 LAB — CBC WITH DIFFERENTIAL/PLATELET
Abs Immature Granulocytes: 0.02 10*3/uL (ref 0.00–0.07)
Basophils Absolute: 0 10*3/uL (ref 0.0–0.1)
Basophils Relative: 0 %
Eosinophils Absolute: 0.2 10*3/uL (ref 0.0–0.5)
Eosinophils Relative: 3 %
HCT: 33.6 % — ABNORMAL LOW (ref 36.0–46.0)
Hemoglobin: 11.3 g/dL — ABNORMAL LOW (ref 12.0–15.0)
Immature Granulocytes: 0 %
Lymphocytes Relative: 30 %
Lymphs Abs: 1.8 10*3/uL (ref 0.7–4.0)
MCH: 30.4 pg (ref 26.0–34.0)
MCHC: 33.6 g/dL (ref 30.0–36.0)
MCV: 90.3 fL (ref 80.0–100.0)
Monocytes Absolute: 0.5 10*3/uL (ref 0.1–1.0)
Monocytes Relative: 8 %
Neutro Abs: 3.4 10*3/uL (ref 1.7–7.7)
Neutrophils Relative %: 59 %
Platelets: 318 10*3/uL (ref 150–400)
RBC: 3.72 MIL/uL — ABNORMAL LOW (ref 3.87–5.11)
RDW: 13.4 % (ref 11.5–15.5)
WBC: 5.8 10*3/uL (ref 4.0–10.5)
nRBC: 0 % (ref 0.0–0.2)

## 2023-03-01 LAB — HEPARIN LEVEL (UNFRACTIONATED): Heparin Unfractionated: 0.17 IU/mL — ABNORMAL LOW (ref 0.30–0.70)

## 2023-03-01 LAB — PROTIME-INR
INR: 1 (ref 0.8–1.2)
Prothrombin Time: 13.9 seconds (ref 11.4–15.2)

## 2023-03-01 LAB — SURGICAL PCR SCREEN
MRSA, PCR: NEGATIVE
Staphylococcus aureus: NEGATIVE

## 2023-03-01 LAB — POCT ACTIVATED CLOTTING TIME
Activated Clotting Time: 214 seconds
Activated Clotting Time: 226 seconds

## 2023-03-01 LAB — BASIC METABOLIC PANEL
Anion gap: 10 (ref 5–15)
BUN: <5 mg/dL — ABNORMAL LOW (ref 8–23)
CO2: 25 mmol/L (ref 22–32)
Calcium: 8.9 mg/dL (ref 8.9–10.3)
Chloride: 104 mmol/L (ref 98–111)
Creatinine, Ser: 0.73 mg/dL (ref 0.44–1.00)
GFR, Estimated: >60 mL/min (ref >60)
Glucose, Bld: 98 mg/dL (ref 70–99)
Potassium: 3.3 mmol/L — ABNORMAL LOW (ref 3.5–5.1)
Sodium: 139 mmol/L (ref 135–145)

## 2023-03-01 SURGERY — IR WITH ANESTHESIA
Anesthesia: General | Laterality: Right

## 2023-03-01 MED ORDER — ONDANSETRON HCL 4 MG/2ML IJ SOLN: 4.0000 mg | Freq: Once | INTRAMUSCULAR | Status: DC | PRN

## 2023-03-01 MED ORDER — IOHEXOL 300 MG/ML  SOLN
150.0000 mL | Freq: Once | INTRAMUSCULAR | Status: AC | PRN
  Administered 2023-03-01: 100 mL via INTRA_ARTERIAL

## 2023-03-01 MED ORDER — HEPARIN (PORCINE) 25000 UT/250ML-% IV SOLN
500.0000 [IU]/h | INTRAVENOUS | Status: DC
  Administered 2023-03-01 (×2): 500 [IU]/h via INTRAVENOUS

## 2023-03-01 MED ORDER — CHLORHEXIDINE GLUCONATE 0.12 % MT SOLN: 15.0000 mL | Freq: Once | OROMUCOSAL | Status: AC

## 2023-03-01 MED ORDER — CLOPIDOGREL BISULFATE 75 MG PO TABS: 75.0000 mg | ORAL_TABLET | ORAL | Status: DC

## 2023-03-01 MED ORDER — FENTANYL CITRATE (PF) 100 MCG/2ML IJ SOLN: 25.0000 ug | INTRAMUSCULAR | Status: DC | PRN

## 2023-03-01 MED ORDER — ACETAMINOPHEN 325 MG PO TABS: 650.0000 mg | ORAL_TABLET | ORAL | Status: DC | PRN

## 2023-03-01 MED ORDER — LACTATED RINGERS IV SOLN: INTRAVENOUS | Status: DC

## 2023-03-01 MED ORDER — HEPARIN (PORCINE) 25000 UT/250ML-% IV SOLN
INTRAVENOUS | Status: AC
  Filled 2023-03-01: qty 250

## 2023-03-01 MED ORDER — ONDANSETRON HCL 4 MG/2ML IJ SOLN
INTRAMUSCULAR | Status: DC | PRN
  Administered 2023-03-01: 4 mg via INTRAVENOUS

## 2023-03-01 MED ORDER — CLEVIDIPINE BUTYRATE 0.5 MG/ML IV EMUL
0.0000 mg/h | INTRAVENOUS | Status: DC
  Administered 2023-03-01: 2 mg/h via INTRAVENOUS
  Filled 2023-03-01: qty 50

## 2023-03-01 MED ORDER — CLEVIDIPINE BUTYRATE 0.5 MG/ML IV EMUL
INTRAVENOUS | Status: AC
  Filled 2023-03-01: qty 100

## 2023-03-01 MED ORDER — CEFAZOLIN SODIUM-DEXTROSE 2-4 GM/100ML-% IV SOLN
2.0000 g | INTRAVENOUS | Status: AC
  Administered 2023-03-01: 2 g via INTRAVENOUS
  Filled 2023-03-01: qty 100

## 2023-03-01 MED ORDER — FENTANYL CITRATE (PF) 100 MCG/2ML IJ SOLN
INTRAMUSCULAR | Status: AC
  Filled 2023-03-01: qty 2

## 2023-03-01 MED ORDER — DEXAMETHASONE SODIUM PHOSPHATE 10 MG/ML IJ SOLN
INTRAMUSCULAR | Status: DC | PRN
  Administered 2023-03-01: 5 mg via INTRAVENOUS

## 2023-03-01 MED ORDER — FENTANYL CITRATE (PF) 250 MCG/5ML IJ SOLN
INTRAMUSCULAR | Status: DC | PRN
  Administered 2023-03-01 (×2): 50 ug via INTRAVENOUS

## 2023-03-01 MED ORDER — IOHEXOL 300 MG/ML  SOLN: 50.0000 mL | Freq: Once | INTRAMUSCULAR | Status: DC | PRN

## 2023-03-01 MED ORDER — PROTAMINE SULFATE 10 MG/ML IV SOLN
INTRAVENOUS | Status: DC | PRN
  Administered 2023-03-01: 5 mg via INTRAVENOUS

## 2023-03-01 MED ORDER — CLOPIDOGREL BISULFATE 75 MG PO TABS
75.0000 mg | ORAL_TABLET | Freq: Every day | ORAL | Status: DC
  Administered 2023-03-02: 75 mg via ORAL
  Filled 2023-03-01: qty 1

## 2023-03-01 MED ORDER — NIMODIPINE 30 MG PO CAPS: 0.0000 mg | ORAL_CAPSULE | ORAL | Status: DC

## 2023-03-01 MED ORDER — SODIUM CHLORIDE 0.9 % IV SOLN: INTRAVENOUS | Status: DC

## 2023-03-01 MED ORDER — LIDOCAINE 2% (20 MG/ML) 5 ML SYRINGE
INTRAMUSCULAR | Status: DC | PRN
  Administered 2023-03-01: 60 mg via INTRAVENOUS

## 2023-03-01 MED ORDER — PHENYLEPHRINE 80 MCG/ML (10ML) SYRINGE FOR IV PUSH (FOR BLOOD PRESSURE SUPPORT)
PREFILLED_SYRINGE | INTRAVENOUS | Status: DC | PRN
  Administered 2023-03-01 (×2): 160 ug via INTRAVENOUS

## 2023-03-01 MED ORDER — HEPARIN (PORCINE) 25000 UT/250ML-% IV SOLN
500.0000 [IU]/h | INTRAVENOUS | Status: AC
  Administered 2023-03-01: 500 [IU]/h via INTRAVENOUS

## 2023-03-01 MED ORDER — EPTIFIBATIDE 20 MG/10ML IV SOLN
INTRAVENOUS | Status: AC | PRN
  Administered 2023-03-01 (×2): 1.5 mg via INTRA_ARTERIAL

## 2023-03-01 MED ORDER — PHENYLEPHRINE HCL-NACL 20-0.9 MG/250ML-% IV SOLN
INTRAVENOUS | Status: DC | PRN
  Administered 2023-03-01: 10 ug/min via INTRAVENOUS

## 2023-03-01 MED ORDER — ORAL CARE MOUTH RINSE: 15.0000 mL | Freq: Once | OROMUCOSAL | Status: AC

## 2023-03-01 MED ORDER — SUGAMMADEX SODIUM 200 MG/2ML IV SOLN
INTRAVENOUS | Status: DC | PRN
  Administered 2023-03-01: 150 mg via INTRAVENOUS

## 2023-03-01 MED ORDER — ACETAMINOPHEN 160 MG/5ML PO SOLN: 650.0000 mg | ORAL | Status: DC | PRN

## 2023-03-01 MED ORDER — ASPIRIN 81 MG PO CHEW: 81.0000 mg | CHEWABLE_TABLET | Freq: Every day | ORAL | Status: DC

## 2023-03-01 MED ORDER — ROCURONIUM BROMIDE 10 MG/ML (PF) SYRINGE
PREFILLED_SYRINGE | INTRAVENOUS | Status: DC | PRN
  Administered 2023-03-01: 10 mg via INTRAVENOUS
  Administered 2023-03-01: 70 mg via INTRAVENOUS

## 2023-03-01 MED ORDER — NITROGLYCERIN 1 MG/10 ML FOR IR/CATH LAB
INTRA_ARTERIAL | Status: AC
  Filled 2023-03-01: qty 10

## 2023-03-01 MED ORDER — ASPIRIN 81 MG PO CHEW
81.0000 mg | CHEWABLE_TABLET | Freq: Every day | ORAL | Status: DC
  Administered 2023-03-02: 81 mg via ORAL
  Filled 2023-03-01: qty 1

## 2023-03-01 MED ORDER — LACTATED RINGERS IV SOLN: INTRAVENOUS | Status: DC | PRN

## 2023-03-01 MED ORDER — CHLORHEXIDINE GLUCONATE 0.12 % MT SOLN
OROMUCOSAL | Status: AC
  Administered 2023-03-01: 15 mL via OROMUCOSAL
  Filled 2023-03-01: qty 15

## 2023-03-01 MED ORDER — LIDOCAINE HCL 1 % IJ SOLN
INTRAMUSCULAR | Status: AC
  Filled 2023-03-01: qty 20

## 2023-03-01 MED ORDER — ACETAMINOPHEN 650 MG RE SUPP: 650.0000 mg | RECTAL | Status: DC | PRN

## 2023-03-01 MED ORDER — CHLORHEXIDINE GLUCONATE CLOTH 2 % EX PADS
6.0000 | MEDICATED_PAD | Freq: Every day | CUTANEOUS | Status: DC
  Administered 2023-03-01: 6 via TOPICAL

## 2023-03-01 MED ORDER — CLOPIDOGREL BISULFATE 75 MG PO TABS: 75.0000 mg | ORAL_TABLET | Freq: Every day | ORAL | Status: DC

## 2023-03-01 MED ORDER — HEPARIN SODIUM (PORCINE) 1000 UNIT/ML IJ SOLN
INTRAMUSCULAR | Status: DC | PRN
  Administered 2023-03-01: 3000 [IU] via INTRAVENOUS

## 2023-03-01 MED ORDER — PROPOFOL 10 MG/ML IV BOLUS
INTRAVENOUS | Status: DC | PRN
  Administered 2023-03-01: 140 mg via INTRAVENOUS

## 2023-03-01 MED ORDER — EPTIFIBATIDE 20 MG/10ML IV SOLN
INTRAVENOUS | Status: AC
  Filled 2023-03-01: qty 10

## 2023-03-01 NOTE — Anesthesia Postprocedure Evaluation (Signed)
Anesthesia Post Note  Patient: Mary [name]  Procedure(s) Performed: Right carotid stent (Right)     Patient location during evaluation: PACU Anesthesia Type: General Level of consciousness: awake and alert and oriented Pain management: pain level controlled Vital Signs Assessment: post-procedure vital signs reviewed and stable Respiratory status: spontaneous breathing, nonlabored ventilation and respiratory function stable Cardiovascular status: blood pressure returned to baseline and stable Postop Assessment: no apparent nausea or vomiting Anesthetic complications: no   No notable events documented.  Last Vitals:  Vitals:   03/01/23 1145 03/01/23 1200  BP: (!) 148/78 (!) 145/74  Pulse: 73   Resp: 14 14  Temp:    SpO2: 100%     Last Pain:  Vitals:   03/01/23 1130  TempSrc:   PainSc: 0-No pain                 Regenia Erck A.

## 2023-03-01 NOTE — Procedures (Signed)
INR.  Status post bilateral carotid arteriograms.  Right CFA approach.  Findings.  1.  Proximal right ICA stenosis of approximately 65% associated with an ulcerated plaque .  Status post endovascular revascularization of stenotic symptomatic right internal carotid artery proximally with stent assisted angioplasty and distal protection.  Manual compression heel at the right groin puncture site with quick clot for 25 minutes. Distal pulses all dopplerable unchanged compared to prior to procedure.  Post CT brain.No ICH  Patient extubated.  Denies any H/As,N/V. Pupils 2 mm Rt = Lt Sluggishly reactive. Mild Lt facial asymmetry unchanged. Tongue midline. Moves all 4s spontaneously and to command.  S.Crissa Sowder MD

## 2023-03-01 NOTE — Progress Notes (Signed)
ANTICOAGULATION CONSULT NOTE  Pharmacy Consult for Heparin Indication:  Post-neuro IR  No Known Allergies  Patient Measurements: Height: 5\' 4"  (162.6 cm) Weight: 51.7 kg (114 lb) IBW/kg (Calculated) : 54.7 Heparin Dosing Weight: 52 kg  Vital Signs: Temp: 99 F (37.2 C) (07/01 1600) Temp Source: Oral (07/01 1600) BP: 111/59 (07/01 2000) Pulse Rate: 66 (07/01 2000)  Labs: Recent Labs    03/01/23 0635 03/01/23 2006  HGB 11.3*  --   HCT 33.6*  --   PLT 318  --   LABPROT 13.9  --   INR 1.0  --   HEPARINUNFRC  --  0.17*  CREATININE 0.73  --      Estimated Creatinine Clearance: 54.2 mL/min (by C-G formula based on SCr of 0.73 mg/dL).   Medical History: Past Medical History:  Diagnosis Date   Anemia    Arthritis    Coronary artery disease    Hyperlipidemia    Hypertension    Hyperuricemia    Osteoporosis    PAD (peripheral artery disease) (HCC) 02/2010   angiplasty, popliteal artery   Pre-diabetes    Stroke (HCC)    Tobacco abuse     Assessment: 60 YOF with R ICA stenosis s/p endovascular revascularization being start on heparin post-neuro IR. Heparin to be discontinued on 7/2 at 0800 per protocol.  Heparin level 0.17 this PM  Goal of Therapy:  Heparin level 0.1-0.25 units/ml aPTT 47-61 seconds Monitor platelets by anticoagulation protocol: Yes   Plan:  Continue heparin 500 units/hr Stop heparin at 0800 on 7/2  Thank you Okey Regal, PharmD 03/01/2023,8:47 PM

## 2023-03-01 NOTE — Sedation Documentation (Signed)
Right knee immobilizer applied by this RN as directed by MD post IR arterial access procedure.

## 2023-03-01 NOTE — Progress Notes (Signed)
Orthopedic Tech Progress Note Patient Details:  Mary [name] Feb 16, 1951 [id number]  Ortho Devices Type of Ortho Device: Knee Immobilizer Ortho Device/Splint Interventions: Ordered, Other (comment)   Post Interventions Patient Tolerated: Other (comment) Instructions Provided: Other (comment)  [name] 03/01/2023, 11:24 AM

## 2023-03-01 NOTE — Progress Notes (Signed)
Patient s/p right ICA angioplasty/stent placement earlier today with Dr. Corliss Skains seen this afternoon for follow up.  Seen on 4N, sleeping but easily arousable to verbal cues. Denies headache, n/v, vision changes, numbness, weakness, tingling, weakness. Asking for something to drink. No issues per RN, groin stable.  Alert, awake, and oriented x 4 Speech and comprehension in tact PERRL bilaterally EOMs without nystagmus or subjective diplopia. Visual fields grossly in tact No facial asymmetry. Tongue midline Motor power 5/5 all 4 extremities Negative pronator drift. Fine motor and coordination in tact Right CFA puncture site clean, dry, dressed appropriately without bleeding. Soft, mildly tender to palpation.  Plan: - May sit up at 4 pm - May start ice chips/clears then advance to soft foods tonight if able to tolerate - Continue heparin gtt - Plavix 75 mg + ASA 81 mg tomorrow AM - Call Dr. Corliss Skains with any overnight concerns - Plan for d/c tomorrow AM if stable  Lynnette Caffey, PA-C

## 2023-03-01 NOTE — Sedation Documentation (Addendum)
ACT = 226

## 2023-03-01 NOTE — Progress Notes (Signed)
ANTICOAGULATION CONSULT NOTE  Pharmacy Consult for Heparin Indication:  Post-neuro IR  No Known Allergies  Patient Measurements: Height: 5\' 4"  (162.6 cm) Weight: 51.7 kg (114 lb) IBW/kg (Calculated) : 54.7 Heparin Dosing Weight: 52 kg  Vital Signs: Temp: 98 F (36.7 C) (07/01 1200) Temp Source: Oral (07/01 0558) BP: 145/74 (07/01 1200) Pulse Rate: 74 (07/01 1200)  Labs: Recent Labs    03/01/23 0635  HGB 11.3*  HCT 33.6*  PLT 318  LABPROT 13.9  INR 1.0  CREATININE 0.73    Estimated Creatinine Clearance: 54.2 mL/min (by C-G formula based on SCr of 0.73 mg/dL).   Medical History: Past Medical History:  Diagnosis Date   Anemia    Arthritis    Coronary artery disease    Hyperlipidemia    Hypertension    Hyperuricemia    Osteoporosis    PAD (peripheral artery disease) (HCC) 02/2010   angiplasty, popliteal artery   Pre-diabetes    Stroke (HCC)    Tobacco abuse     Assessment: 58 YOF with R ICA stenosis s/p endovascular revascularization being start on heparin post-neuro IR. Heparin to be discontinued on 7/2 at 0800 per protocol.  Goal of Therapy:  Heparin level 0.1-0.25 units/ml aPTT 47-61 seconds Monitor platelets by anticoagulation protocol: Yes   Plan:  Continue heparin 500 units/hr Check heparin level in 8 hours Stop heparin at 0800 on 7/2  Ellis Savage, PharmD Clinical Pharmacist 03/01/2023,12:46 PM

## 2023-03-01 NOTE — Anesthesia Procedure Notes (Signed)
Procedure Name: Intubation Date/Time: 03/01/2023 8:44 AM  Performed by: Alease Medina, CRNAPre-anesthesia Checklist: Patient identified, Emergency Drugs available, Suction available and Patient being monitored Patient Re-evaluated:Patient Re-evaluated prior to induction Oxygen Delivery Method: Circle system utilized Preoxygenation: Pre-oxygenation with 100% oxygen Induction Type: IV induction Ventilation: Mask ventilation without difficulty Laryngoscope Size: Mac and 3 Grade View: Grade I Tube type: Oral Tube size: 7.0 mm Number of attempts: 1 Airway Equipment and Method: Stylet and Oral airway Placement Confirmation: ETT inserted through vocal cords under direct vision, positive ETCO2 and breath sounds checked- equal and bilateral Secured at: 21 cm Tube secured with: Tape Dental Injury: Teeth and Oropharynx as per pre-operative assessment

## 2023-03-01 NOTE — Sedation Documentation (Signed)
ACT 214

## 2023-03-01 NOTE — Transfer of Care (Signed)
Immediate Anesthesia Transfer of Care Note  Patient: Mary [name]  Procedure(s) Performed: Right carotid stent (Right)  Patient Location: PACU  Anesthesia Type:General  Level of Consciousness: drowsy and patient cooperative  Airway & Oxygen Therapy: Patient Spontanous Breathing and Patient connected to nasal cannula oxygen  Post-op Assessment: Report given to RN, Post -op Vital signs reviewed and stable, and Patient moving all extremities X 4  Post vital signs: Reviewed and stable  Last Vitals:  Vitals Value Taken Time  BP 131/73 03/01/23 1104  Temp    Pulse 77 03/01/23 1107  Resp 17 03/01/23 1112  SpO2 100 % 03/01/23 1107  Vitals shown include unvalidated device data.  Last Pain:  Vitals:   03/01/23 0621  TempSrc:   PainSc: 0-No pain         Complications: No notable events documented.

## 2023-03-02 ENCOUNTER — Encounter (HOSPITAL_COMMUNITY): Payer: Self-pay | Admitting: Interventional Radiology

## 2023-03-02 LAB — BASIC METABOLIC PANEL
Anion gap: 8 (ref 5–15)
BUN: 7 mg/dL — ABNORMAL LOW (ref 8–23)
CO2: 24 mmol/L (ref 22–32)
Calcium: 8.5 mg/dL — ABNORMAL LOW (ref 8.9–10.3)
Chloride: 106 mmol/L (ref 98–111)
Creatinine, Ser: 0.76 mg/dL (ref 0.44–1.00)
GFR, Estimated: >60 mL/min (ref >60)
Glucose, Bld: 110 mg/dL — ABNORMAL HIGH (ref 70–99)
Potassium: 3.5 mmol/L (ref 3.5–5.1)
Sodium: 138 mmol/L (ref 135–145)

## 2023-03-02 LAB — CBC WITH DIFFERENTIAL/PLATELET
Abs Immature Granulocytes: 0.03 10*3/uL (ref 0.00–0.07)
Basophils Absolute: 0 10*3/uL (ref 0.0–0.1)
Basophils Relative: 0 %
Eosinophils Absolute: 0 10*3/uL (ref 0.0–0.5)
Eosinophils Relative: 0 %
HCT: 30.9 % — ABNORMAL LOW (ref 36.0–46.0)
Hemoglobin: 10 g/dL — ABNORMAL LOW (ref 12.0–15.0)
Immature Granulocytes: 0 %
Lymphocytes Relative: 32 %
Lymphs Abs: 2.4 10*3/uL (ref 0.7–4.0)
MCH: 29 pg (ref 26.0–34.0)
MCHC: 32.4 g/dL (ref 30.0–36.0)
MCV: 89.6 fL (ref 80.0–100.0)
Monocytes Absolute: 0.6 10*3/uL (ref 0.1–1.0)
Monocytes Relative: 7 %
Neutro Abs: 4.5 10*3/uL (ref 1.7–7.7)
Neutrophils Relative %: 61 %
Platelets: 301 10*3/uL (ref 150–400)
RBC: 3.45 MIL/uL — ABNORMAL LOW (ref 3.87–5.11)
RDW: 13.7 % (ref 11.5–15.5)
WBC: 7.5 10*3/uL (ref 4.0–10.5)
nRBC: 0 % (ref 0.0–0.2)

## 2023-03-02 NOTE — Discharge Instructions (Signed)
Femoral Site Care This sheet gives you information about how to care for yourself after your procedure. Your health care provider may also give you more specific instructions. If you have problems or questions, contact your health care provider. What can I expect after the procedure? After the procedure, it is common to have: Bruising that usually fades within 1-2 weeks. Tenderness at the site. Follow these instructions at home: Wound care Follow instructions from your health care provider about how to take care of your insertion site. Make sure you: Wash your hands with soap and water before you change your bandage (dressing). If soap and water are not available, use hand sanitizer. Change your dressing as directed- pressure dressing removed 24 hours post-procedure (and switch for bandaid), bandaid removed 72 hours post-procedure Do not take baths, swim, or use a hot tub for 7 days post-procedure. You may shower 48 hours after the procedure or as told by your health care provider. Gently wash the site with plain soap and water. Pat the area dry with a clean towel. Do not rub the site. This may cause bleeding. Check your site every day for signs of infection. Check for: Redness, swelling, or pain. Fluid or blood. Warmth. Pus or a bad smell. Activity Do not stoop, bend, or lift anything that is heavier than 10 lb (4.5 kg) for 2 weeks post-procedure. Do not drive self for 2 weeks post-procedure. Hold firm pressure on your groin puncture site when coughing, sneezing, going up stairs, etc. Contact a health care provider if you have: A fever or chills. You have redness, swelling, or pain around your insertion site. Get help right away if: The catheter insertion area swells very fast. You pass out. You suddenly start to sweat or your skin gets clammy. The catheter insertion area is bleeding, and the bleeding does not stop when you hold steady pressure on the area. The area near or just  beyond the catheter insertion site becomes pale, cool, tingly, or numb. These symptoms may represent a serious problem that is an emergency. Do not wait to see if the symptoms will go away. Get medical help right away. Call your local emergency services (911 in the [place] Do not drive yourself to the hospital.  This information is not intended to replace advice given to you by your health care provider. Make sure you discuss any questions you have with your health care provider. Document Revised: 08/30/2017 Document Reviewed: 08/30/2017 Elsevier Patient Education  2020 [employer].

## 2023-03-02 NOTE — Discharge Summary (Signed)
Patient ID: Mary [name] MRN: [id number] DOB/AGE: 04-Dec-1950 72 y.o.  Admit date: 03/01/2023 Discharge date: 03/02/2023  Supervising Physician: Julieanne Cotton  Patient Status: [facility] - In-pt  Admission Diagnoses: Right ICA stenosis  Discharge Diagnoses:  Principal Problem:   Internal carotid artery stenosis, right   Discharged Condition: good  Hospital Course: 72 y/o F with history of PAD, CAD, HTN, tobacco use and right ICA occlusion s/p thrombectomy 01/29/23 with Dr. Corliss Skains who presented to [facility] 03/01/23 for planned outpatient cerebral angiogram with possible intervention underwent general anesthesia. Mary [name] underwent successful right ICA stent assisted angioplasty on 7/1 and was admitted for overnight observation. She had an uneventful overnight course and is seen today for follow up.  Patient sitting up in bed eating breakfast. She denies complaints - specifically no HA, n/v, dizziness, vertigo, vision changes, weakness, numbness or tingling. She was able to walk back and forth to the bathroom last night without issue. She feels ready to go home. Reviewed femoral site care, follow up plans and ED return precautions which she states understanding to. She is aware that she needs to take her ASA and Plavix every day to help avoid stent clotting.  Consults: None  Significant Diagnostic Studies: VAS [place] UPPER EXTREMITY VENOUS DUPLEX  Result Date: 02/02/2023 UPPER VENOUS STUDY  Patient Name:  Mary [name]  Date of Exam:   02/02/2023 Medical Rec #: [id number]      Accession #:    [id number] Date of Birth: [birth year]       Patient Gender: F Patient Age:   65 years Exam Location:  [facility] Procedure:      VAS [place] UPPER EXTREMITY VENOUS DUPLEX Referring Phys: Dewitt Hoes DE LA TORRE --------------------------------------------------------------------------------  Indications: Swelling Risk Factors: Trauma. Comparison Study: No prior studies. Performing Technologist: Chanda Busing RVT   Examination Guidelines: A complete evaluation includes B-mode imaging, spectral Doppler, color Doppler, and power Doppler as needed of all accessible portions of each vessel. Bilateral testing is considered an integral part of a complete examination. Limited examinations for reoccurring indications may be performed as noted.  Right Findings: +----------+------------+---------+-----------+----------+-----------------+ RIGHT     CompressiblePhasicitySpontaneousProperties     Summary      +----------+------------+---------+-----------+----------+-----------------+ IJV           Full       Yes       Yes                                +----------+------------+---------+-----------+----------+-----------------+ Subclavian    Full       Yes       Yes                                +----------+------------+---------+-----------+----------+-----------------+ Axillary      Full       Yes       Yes                                +----------+------------+---------+-----------+----------+-----------------+ Brachial      Full                                                    +----------+------------+---------+-----------+----------+-----------------+  Radial        Full                                                    +----------+------------+---------+-----------+----------+-----------------+ Ulnar         Full                                                    +----------+------------+---------+-----------+----------+-----------------+ Cephalic      None                                  Age Indeterminate +----------+------------+---------+-----------+----------+-----------------+ Basilic       Full                                                    +----------+------------+---------+-----------+----------+-----------------+ Thrombus located in the cephalic vein is noted to be in the [facility] segment only.  Left Findings:  +----------+------------+---------+-----------+----------+-------+ LEFT      CompressiblePhasicitySpontaneousPropertiesSummary +----------+------------+---------+-----------+----------+-------+ Subclavian    Full       Yes       Yes                      +----------+------------+---------+-----------+----------+-------+  Summary:  Right: No evidence of deep vein thrombosis in the upper extremity.  Left: No evidence of thrombosis in the subclavian. Findings consistent with age indeterminate superficial vein thrombosis involving the left cephalic vein.  *See table(s) above for measurements and observations.  Diagnosing physician: Waverly Ferrari MD Electronically signed by Waverly Ferrari MD on 02/02/2023 at 4:22:15 PM.    Final     Treatments: None.  Discharge Exam: Blood pressure 122/62, pulse 74, temperature 98.1 F (36.7 C), temperature source Oral, resp. rate 17, height 5\' 4"  (1.626 m), weight 114 lb (51.7 kg), SpO2 100 %. Physical Exam Vitals and nursing note reviewed.  Constitutional:      General: She is not in acute distress. HENT:     Head: Normocephalic.  Cardiovascular:     Rate and Rhythm: Normal rate and regular rhythm.  Pulmonary:     Effort: Pulmonary effort is normal.  Abdominal:     General: There is no distension.     Palpations: Abdomen is soft.  Skin:    General: Skin is warm and dry.  Neurological:     Mental Status: She is alert and oriented to person, place, and time.   Alert, awake, and oriented x 4 Speech and comprehension in tact PERRL bilaterally EOMs without nystagmus or subjective diplopia. Visual fields grossly in tact No facial asymmetry. Tongue midline  Motor power - 5/5 all 4 extremities Negative pronator drift. Fine motor and coordination in tact   Disposition: Discharge disposition: 01-Home or Self Care        Allergies as of 03/02/2023   No Known Allergies      Medication List     TAKE these medications     acetaminophen 325 MG tablet Commonly known as: TYLENOL Take 2  tablets (650 mg total) by mouth every 4 (four) hours as needed for mild pain (or temp > 37.5 C (99.5 F)).   amLODipine 5 MG tablet Commonly known as: NORVASC Take 1 tablet (5 mg total) by mouth daily.   aspirin 81 MG chewable tablet Chew 1 tablet (81 mg total) by mouth daily.   atorvastatin 10 MG tablet Commonly known as: LIPITOR Take 1 tablet (10 mg total) by mouth at bedtime.   Blood Glucose Monitoring Suppl Devi 1 each by Does not apply route daily as needed. May substitute to any manufacturer covered by patient's insurance.   BLOOD GLUCOSE TEST STRIPS Strp Use strips with glucometer daily as needed for blood sugar monitoring.  May substitute to any manufacturer covered by patient's insurance.   clopidogrel 75 MG tablet Commonly known as: PLAVIX Take 1 tablet (75 mg total) by mouth daily.   Lancets Misc. Misc Use lancets with glucometer daily prn for blood sugar monitoring.  May substitute to any manufacturer covered by patient's insurance.   nicotine 7 mg/24hr patch Commonly known as: NICODERM CQ - dosed in mg/24 hr 7 mg patch daily x 1 week and stop   vitamin D3 25 MCG tablet Commonly known as: CHOLECALCIFEROL Take 2 tablets (2,000 Units total) by mouth daily.        Follow-up Information     Julieanne Cotton, MD Follow up.   Specialties: Interventional Radiology, Radiology Why: Scheduler will call you to setup a 2 week follow up appointment with Dr. Corliss Skains. Please call [phone] or [phone] with any questions or concerns prior to your appointment. Contact information: [address] [place] [address] [phone]                  Electronically Signed: Villa Herb, PA-C 03/02/2023, 9:05 AM   I have spent Less Than 30 Minutes discharging [name].

## 2023-03-02 NOTE — Plan of Care (Signed)

## 2023-03-08 ENCOUNTER — Other Ambulatory Visit: Payer: Self-pay | Admitting: Family Medicine

## 2023-03-08 ENCOUNTER — Other Ambulatory Visit (INDEPENDENT_AMBULATORY_CARE_PROVIDER_SITE_OTHER): Payer: Self-pay | Admitting: Nurse Practitioner

## 2023-03-08 DIAGNOSIS — Z9889 Other specified postprocedural states: Secondary | ICD-10-CM

## 2023-03-08 NOTE — Telephone Encounter (Signed)
Medication Refill - Medication: cholecalciferol (VITAMIN D3) 25 MCG (1000 UNIT) tablet   Has the patient contacted their pharmacy? No. Pt prescribed this when she was in hospital.  She has not seen Leisa for this med.  Preferred Pharmacy (with phone number or street name): [facility] - [place], [place] - [address]  Has the patient been seen for an appointment in the last year OR does the patient have an upcoming appointment? Yes.    Agent: Please be advised that RX refills may take up to 3 business days. We ask that you follow-up with your pharmacy.

## 2023-03-08 NOTE — Telephone Encounter (Signed)
Requested medication (s) are due for refill today: routing for review  Requested medication (s) are on the active medication list: yes  Last refill:  01/08/23  Future visit scheduled: yes  Notes to clinic:   Manual Review: Route requests for 50,000 IU strength to the provider.     Requested Prescriptions  Pending Prescriptions Disp Refills   cholecalciferol (VITAMIN D3) 25 MCG (1000 UNIT) tablet 30 tablet 0    Sig: Take 2 tablets (2,000 Units total) by mouth daily.     Endocrinology:  Vitamins - Vitamin D Supplementation 2 Failed - 03/08/2023  9:23 AM      Failed - Manual Review: Route requests for 50,000 IU strength to the provider      Failed - Ca in normal range and within 360 days    Calcium  Date Value Ref Range Status  03/02/2023 8.5 (L) 8.9 - 10.3 mg/dL Final   Calcium, Total  Date Value Ref Range Status  01/21/2012 9.3 8.5 - 10.1 mg/dL Final   Calcium, Ion  Date Value Ref Range Status  01/27/2023 1.13 (L) 1.15 - 1.40 mmol/L Final         Failed - Vitamin D in normal range and within 360 days    Vit D, 25-Hydroxy  Date Value Ref Range Status  01/09/2021 25 (L) 30 - 100 ng/mL Final    Comment:    Vitamin D Status         25-OH Vitamin D: . Deficiency:                    <20 ng/mL Insufficiency:             20 - 29 ng/mL Optimal:                 > or = 30 ng/mL . For 25-OH Vitamin D testing on patients on  D2-supplementation and patients for whom quantitation  of D2 and D3 fractions is required, the Quest[employer]ureD(TM) 25-OH VIT D, (D2,D3), LC/MS/MS is recommended: order  code 14782 (patients >30yrs). See Note 1 . Note 1 . For additional information, please refer to  http://education.QuestDiagnostics.com/faq/FAQ199  (This link is being provided for informational/ educational purposes only.)          Passed - Valid encounter within last 12 months    Recent Outpatient Visits           3 weeks ago Encounter for examination following treatment at hospital    [facility] Danelle Berry, PA-C   2 months ago Anticoagulated on Coumadin   [facility] Danelle Berry, PA-C   5 months ago Mixed hyperlipidemia   [facility] Danelle Berry, PA-C   8 months ago PAD (peripheral artery disease) [facility]   [facility] Berniece Salines, FNP   9 months ago PAD (peripheral artery disease) [facility])   [facility] Berniece Salines, FNP       Future Appointments             In 4 days Kirsteins, Victorino Sparrow, MD [facility], CPR   In 3 weeks  [facility], P[facility]   In 2 months Danelle Berry, PA-C [facility], [facility]

## 2023-03-12 ENCOUNTER — Encounter: Payer: Medicare PPO | Admitting: Physical Medicine & Rehabilitation

## 2023-03-15 ENCOUNTER — Encounter: Payer: Self-pay | Admitting: Family Medicine

## 2023-03-15 ENCOUNTER — Ambulatory Visit (INDEPENDENT_AMBULATORY_CARE_PROVIDER_SITE_OTHER): Payer: Medicare PPO | Admitting: Family Medicine

## 2023-03-15 VITALS — BP 134/76 | HR 98 | Temp 98.3°F | Resp 16 | Ht 64.0 in | Wt 113.4 lb

## 2023-03-15 DIAGNOSIS — Z20822 Contact with and (suspected) exposure to covid-19: Secondary | ICD-10-CM

## 2023-03-15 DIAGNOSIS — R0981 Nasal congestion: Secondary | ICD-10-CM

## 2023-03-15 DIAGNOSIS — U071 COVID-19: Secondary | ICD-10-CM

## 2023-03-15 DIAGNOSIS — R197 Diarrhea, unspecified: Secondary | ICD-10-CM

## 2023-03-15 DIAGNOSIS — J069 Acute upper respiratory infection, unspecified: Secondary | ICD-10-CM | POA: Diagnosis not present

## 2023-03-15 MED ORDER — VITAMIN D 25 MCG (1000 UNIT) PO TABS: 2000.0000 [IU] | ORAL_TABLET | Freq: Every day | ORAL | 1 refills | Status: AC

## 2023-03-15 MED ORDER — NIRMATRELVIR/RITONAVIR (PAXLOVID)TABLET
3.0000 | ORAL_TABLET | Freq: Two times a day (BID) | ORAL | 0 refills | Status: AC
Start: 2023-03-15 — End: 2023-03-20

## 2023-03-15 NOTE — Patient Instructions (Signed)
Push clear fluids with electrolytes Please follow up right away or go get checked out at the ER right away if you are having any trouble hydrating, feel like you're going to pass out or if you have any difficulty breathing.  As soon as you have a positive covid test result you can start the paxlovid medication. I recommend getting a home covid test so that you will have results faster than waiting for the send out [facility] test (which may be 2 days).

## 2023-03-15 NOTE — Progress Notes (Signed)
Patient ID: Mary [name], female    DOB: 02-06-1951, 72 y.o.   MRN: [id number]  PCP: Danelle Berry, PA-C  Chief Complaint  Patient presents with   Covid Exposure    Husband currently in hospital   Nasal Congestion    Runny nose started yesterday, no other sx    Subjective:   Mary [name] is a 72 y.o. female, presents to clinic with CC of the following:  HPI  Pt exposed to covid with husbands illness - he's currently in the hospital She had sx start yesterday - runny nose only, slight cough non productive Decreased appetite, a little diarrhea this am, no vomiting She is drinking a lot of fluids and urinating frequently She feels fatigued and has been trying to rest at home She denies chest pain shortness of breath near syncope, abdominal pain  She needs a refill on her vitamin D prescription and she is not sure if she will get a home COVID test or if she just wants to wait for the send out test obtained today  Patient Active Problem List   Diagnosis Date Noted   Internal carotid artery stenosis, right 03/01/2023   Hemiparesis affecting left side as late effect of stroke (HCC) 02/15/2023   Right middle cerebral artery stroke (HCC) 02/02/2023   Stroke (HCC) 01/27/2023   Internal carotid artery occlusion, right 01/27/2023   Coagulation disorder (HCC) 01/01/2022   Anticoagulated on Coumadin 02/05/2020   History of gout 06/23/2019   Coronary artery disease 02/16/2019   Benign neoplasm of cecum    Benign neoplasm of transverse colon    Polyp of sigmoid colon    External hemorrhoids    Hip arthritis 10/27/2017   Encounter for monitoring Coumadin therapy 02/27/2015   PAD (peripheral artery disease) (HCC)    Essential hypertension    Mixed hyperlipidemia    Tobacco abuse    Hyperuricemia       Current Outpatient Medications:    acetaminophen (TYLENOL) 325 MG tablet, Take 2 tablets (650 mg total) by mouth every 4 (four) hours as needed for mild pain (or temp > 37.5 C  (99.5 F))., Disp: , Rfl:    amLODipine (NORVASC) 5 MG tablet, Take 1 tablet (5 mg total) by mouth daily., Disp: 30 tablet, Rfl: 0   aspirin 81 MG chewable tablet, Chew 1 tablet (81 mg total) by mouth daily., Disp: 30 tablet, Rfl: 1   atorvastatin (LIPITOR) 10 MG tablet, Take 1 tablet (10 mg total) by mouth at bedtime., Disp: 90 tablet, Rfl: 3   Blood Glucose Monitoring Suppl DEVI, 1 each by Does not apply route daily as needed. May substitute to any manufacturer covered by patient's insurance., Disp: 1 each, Rfl: 0   cholecalciferol (VITAMIN D3) 25 MCG (1000 UNIT) tablet, Take 2 tablets (2,000 Units total) by mouth daily., Disp: 30 tablet, Rfl: 0   clopidogrel (PLAVIX) 75 MG tablet, Take 1 tablet (75 mg total) by mouth daily., Disp: 90 tablet, Rfl: 1   Glucose Blood (BLOOD GLUCOSE TEST STRIPS) STRP, Use strips with glucometer daily as needed for blood sugar monitoring.  May substitute to any manufacturer covered by patient's insurance., Disp: 25 strip, Rfl: 5   Lancets Misc. MISC, Use lancets with glucometer daily prn for blood sugar monitoring.  May substitute to any manufacturer covered by patient's insurance., Disp: 30 each, Rfl: 5   nicotine (NICODERM CQ - DOSED IN MG/24 HR) 7 mg/24hr patch, 7 mg patch daily x 1 week and  stop (Patient not taking: Reported on 03/15/2023), Disp: 7 patch, Rfl: 0   No Known Allergies   Social History   Tobacco Use   Smoking status: Every Day    Current packs/day: 0.50    Average packs/day: 0.5 packs/day for 40.0 years (20.0 ttl pk-yrs)    Types: Cigarettes   Smokeless tobacco: Never   Tobacco comments:    Down to less than 1/2 pack per day as of 02/25/23  Vaping Use   Vaping status: Never Used  Substance Use Topics   Alcohol use: No   Drug use: No      Chart Review Today: I personally reviewed active problem list, medication list, allergies, family history, social history, health maintenance, notes from last encounter, lab results, imaging with the  patient/caregiver today.   Review of Systems  Constitutional: Negative.   HENT: Negative.    Eyes: Negative.   Respiratory: Negative.    Cardiovascular: Negative.   Gastrointestinal: Negative.   Endocrine: Negative.   Genitourinary: Negative.   Musculoskeletal: Negative.   Skin: Negative.   Allergic/Immunologic: Negative.   Neurological: Negative.   Hematological: Negative.   Psychiatric/Behavioral: Negative.    All other systems reviewed and are negative.      Objective:   Vitals:   03/15/23 1034  BP: 134/76  Pulse: 98  Resp: 16  Temp: 98.3 F (36.8 C)  TempSrc: Oral  SpO2: 97%  Weight: 113 lb 6.4 oz (51.4 kg)  Height: 5\' 4"  (1.626 m)    Body mass index is 19.47 kg/m.  Physical Exam Vitals and nursing note reviewed.  Constitutional:      General: She is not in acute distress.    Appearance: Normal appearance. She is well-developed and well-groomed. She is not ill-appearing, toxic-appearing or diaphoretic.     Interventions: Face mask in place.  HENT:     Head: Normocephalic and atraumatic.     Right Ear: External ear normal.     Left Ear: External ear normal.     Nose: Nose normal.     Mouth/Throat:     Mouth: Mucous membranes are dry.     Pharynx: Oropharynx is clear. No oropharyngeal exudate or posterior oropharyngeal erythema.  Eyes:     General: No scleral icterus.       Right eye: No discharge.        Left eye: No discharge.     Conjunctiva/sclera: Conjunctivae normal.  Neck:     Trachea: No tracheal deviation.  Cardiovascular:     Rate and Rhythm: Normal rate and regular rhythm.     Pulses: Normal pulses.     Heart sounds: Normal heart sounds.  Pulmonary:     Effort: Pulmonary effort is normal. No respiratory distress.     Breath sounds: Normal breath sounds. No stridor. No wheezing, rhonchi or rales.  Abdominal:     General: Bowel sounds are normal. There is no distension.     Palpations: Abdomen is soft.  Skin:    General: Skin is warm  and dry.     Findings: No rash.  Neurological:     Mental Status: She is alert.     Motor: No abnormal muscle tone.     Coordination: Coordination normal.  Psychiatric:        Mood and Affect: Mood normal.        Behavior: Behavior normal. Behavior is cooperative.      Results for orders placed or performed during the hospital encounter of 03/01/23  Surgical pcr screen   Specimen: Nasal Mucosa; Nasal Swab  Result Value Ref Range   MRSA, PCR NEGATIVE NEGATIVE   Staphylococcus aureus NEGATIVE NEGATIVE  CBC with Differential/Platelet  Result Value Ref Range   WBC 5.8 4.0 - 10.5 K/uL   RBC 3.72 (L) 3.87 - 5.11 MIL/uL   Hemoglobin 11.3 (L) 12.0 - 15.0 g/dL   HCT 40.9 (L) 81.1 - 91.4 %   MCV 90.3 80.0 - 100.0 fL   MCH 30.4 26.0 - 34.0 pg   MCHC 33.6 30.0 - 36.0 g/dL   RDW 78.2 95.6 - 21.3 %   Platelets 318 150 - 400 K/uL   nRBC 0.0 0.0 - 0.2 %   Neutrophils Relative % 59 %   Neutro Abs 3.4 1.7 - 7.7 K/uL   Lymphocytes Relative 30 %   Lymphs Abs 1.8 0.7 - 4.0 K/uL   Monocytes Relative 8 %   Monocytes Absolute 0.5 0.1 - 1.0 K/uL   Eosinophils Relative 3 %   Eosinophils Absolute 0.2 0.0 - 0.5 K/uL   Basophils Relative 0 %   Basophils Absolute 0.0 0.0 - 0.1 K/uL   Immature Granulocytes 0 %   Abs Immature Granulocytes 0.02 0.00 - 0.07 K/uL  Protime-INR  Result Value Ref Range   Prothrombin Time 13.9 11.4 - 15.2 seconds   INR 1.0 0.8 - 1.2  Basic metabolic panel  Result Value Ref Range   Sodium 139 135 - 145 mmol/L   Potassium 3.3 (L) 3.5 - 5.1 mmol/L   Chloride 104 98 - 111 mmol/L   CO2 25 22 - 32 mmol/L   Glucose, Bld 98 70 - 99 mg/dL   BUN <5 (L) 8 - 23 mg/dL   Creatinine, Ser 0.86 0.44 - 1.00 mg/dL   Calcium 8.9 8.9 - 57.8 mg/dL   GFR, Estimated >46 >96 mL/min   Anion gap 10 5 - 15  Glucose, capillary  Result Value Ref Range   Glucose-Capillary 97 70 - 99 mg/dL   Comment 1 Notify RN    Comment 2 Document in Chart   Glucose, capillary  Result Value Ref Range    Glucose-Capillary 140 (H) 70 - 99 mg/dL   Comment 1 Notify RN    Comment 2 Document in Chart   CBC with Differential/Platelet  Result Value Ref Range   WBC 7.5 4.0 - 10.5 K/uL   RBC 3.45 (L) 3.87 - 5.11 MIL/uL   Hemoglobin 10.0 (L) 12.0 - 15.0 g/dL   HCT 29.5 (L) 28.4 - 13.2 %   MCV 89.6 80.0 - 100.0 fL   MCH 29.0 26.0 - 34.0 pg   MCHC 32.4 30.0 - 36.0 g/dL   RDW 44.0 10.2 - 72.5 %   Platelets 301 150 - 400 K/uL   nRBC 0.0 0.0 - 0.2 %   Neutrophils Relative % 61 %   Neutro Abs 4.5 1.7 - 7.7 K/uL   Lymphocytes Relative 32 %   Lymphs Abs 2.4 0.7 - 4.0 K/uL   Monocytes Relative 7 %   Monocytes Absolute 0.6 0.1 - 1.0 K/uL   Eosinophils Relative 0 %   Eosinophils Absolute 0.0 0.0 - 0.5 K/uL   Basophils Relative 0 %   Basophils Absolute 0.0 0.0 - 0.1 K/uL   Immature Granulocytes 0 %   Abs Immature Granulocytes 0.03 0.00 - 0.07 K/uL  Basic metabolic panel  Result Value Ref Range   Sodium 138 135 - 145 mmol/L   Potassium 3.5 3.5 - 5.1 mmol/L  Chloride 106 98 - 111 mmol/L   CO2 24 22 - 32 mmol/L   Glucose, Bld 110 (H) 70 - 99 mg/dL   BUN 7 (L) 8 - 23 mg/dL   Creatinine, Ser 6.44 0.44 - 1.00 mg/dL   Calcium 8.5 (L) 8.9 - 10.3 mg/dL   GFR, Estimated >03 >47 mL/min   Anion gap 8 5 - 15  Heparin level (unfractionated)  Result Value Ref Range   Heparin Unfractionated 0.17 (L) 0.30 - 0.70 IU/mL       Assessment & Plan:   1. Close exposure to COVID-19 virus Recommended she get and do a home covid test today in order to expedite tx if she desires paxlovid - Novel Coronavirus, NAA ([facility])  2. Nasal congestion Mild dx, some postnasal drip, dry cough, lungs CTA, no CP/SOB/wheeze - Novel Coronavirus, NAA ([facility])  3. Upper respiratory tract infection due to COVID-19 virus If send out or home covid test is positive recommended she start paxlovid  Also recommended pushing fluids, resting, and other supportive and sx measures and close f/up if she has any worsening of sx -  nirmatrelvir/ritonavir (PAXLOVID) 20 x 150 MG & 10 x 100MG  TABS; Take 3 tablets by mouth 2 (two) times daily for 5 days. Take nirmatrelvir (150 mg) two tablets twice daily for 5 days and ritonavir (100 mg) one tablet twice daily for 5 days.Patient GFR is greater than 60  Dispense: 30 tablet; Refill: 0  4. Diarrhea, unspecified type Mild, one episode of normal stool this am and one slightly loose, she is eating and drinking, no abd pain Encouraged pushing fluids and f/up if any sx worsen.  Return if symptoms worsen or fail to improve.       Danelle Berry, PA-C 03/15/23 10:50 AM

## 2023-03-17 LAB — NOVEL CORONAVIRUS, NAA: SARS-CoV-2, NAA: DETECTED — AB

## 2023-03-18 ENCOUNTER — Encounter (INDEPENDENT_AMBULATORY_CARE_PROVIDER_SITE_OTHER): Payer: Medicare HMO | Admitting: Vascular Surgery

## 2023-03-18 ENCOUNTER — Encounter (INDEPENDENT_AMBULATORY_CARE_PROVIDER_SITE_OTHER): Payer: Medicare HMO

## 2023-03-18 ENCOUNTER — Telehealth (INDEPENDENT_AMBULATORY_CARE_PROVIDER_SITE_OTHER): Payer: Self-pay

## 2023-03-18 NOTE — Telephone Encounter (Signed)
Patient called to reschedule her 1:45 appt. She has Covid.

## 2023-04-06 ENCOUNTER — Ambulatory Visit (HOSPITAL_COMMUNITY)
Admission: RE | Admit: 2023-04-06 | Discharge: 2023-04-06 | Disposition: A | Discharge status: Home / Self Care | Payer: Medicare PPO | Source: Ambulatory Visit | Attending: Physician Assistant | Admitting: Physician Assistant

## 2023-04-06 DIAGNOSIS — I6521 Occlusion and stenosis of right carotid artery: Secondary | ICD-10-CM

## 2023-04-07 HISTORY — PX: IR RADIOLOGIST EVAL & MGMT: IMG5224

## 2023-04-16 ENCOUNTER — Encounter: Payer: Medicare PPO | Attending: Physical Medicine & Rehabilitation | Admitting: Physical Medicine & Rehabilitation

## 2023-04-22 ENCOUNTER — Encounter: Payer: Self-pay | Admitting: Family Medicine

## 2023-04-22 ENCOUNTER — Telehealth (INDEPENDENT_AMBULATORY_CARE_PROVIDER_SITE_OTHER): Payer: Medicare PPO | Admitting: Family Medicine

## 2023-04-22 ENCOUNTER — Telehealth: Payer: Self-pay | Admitting: Family Medicine

## 2023-04-22 DIAGNOSIS — F17210 Nicotine dependence, cigarettes, uncomplicated: Secondary | ICD-10-CM | POA: Diagnosis not present

## 2023-04-22 DIAGNOSIS — Z716 Tobacco abuse counseling: Secondary | ICD-10-CM | POA: Diagnosis not present

## 2023-04-22 DIAGNOSIS — F17200 Nicotine dependence, unspecified, uncomplicated: Secondary | ICD-10-CM

## 2023-04-22 DIAGNOSIS — Z720 Tobacco use: Secondary | ICD-10-CM

## 2023-04-22 MED ORDER — VARENICLINE TARTRATE 1 MG PO TABS: 1.0000 mg | ORAL_TABLET | Freq: Two times a day (BID) | ORAL | 4 refills | Status: DC

## 2023-04-22 MED ORDER — NICOTINE 7 MG/24HR TD PT24
7.0000 mg | MEDICATED_PATCH | Freq: Every day | TRANSDERMAL | 2 refills | Status: DC
Start: 2023-05-23 — End: 2024-06-21

## 2023-04-22 MED ORDER — VARENICLINE TARTRATE (STARTER) 0.5 MG X 11 & 1 MG X 42 PO TBPK: ORAL_TABLET | ORAL | 0 refills | Status: DC

## 2023-04-22 MED ORDER — NICOTINE 14 MG/24HR TD PT24
14.0000 mg | MEDICATED_PATCH | Freq: Every day | TRANSDERMAL | 1 refills | Status: DC
Start: 2023-04-22 — End: 2024-06-21

## 2023-04-22 NOTE — Telephone Encounter (Signed)
Virtual appt sch'd with Sheliah Mends for today

## 2023-04-22 NOTE — Telephone Encounter (Signed)
Pt needs an appt can do virtual with Sheliah Mends

## 2023-04-22 NOTE — Telephone Encounter (Signed)
Copied from CRM [id number]. Topic: General - Other >> Apr 22, 2023  8:33 AM Franchot Heidelberg wrote: Reason for CRM: Pt called and wants to discuss options for smoking cessation.   Best contact: [phone]

## 2023-04-22 NOTE — Progress Notes (Signed)
Name: Mary [name]   MRN: [id number]    DOB: 1951/02/12   Date:04/22/2023       Progress Note  Subjective:    Chief Complaint  Chief Complaint  Patient presents with   Smoking cessation    I connected with  [name] on 04/22/23 at  9:40 AM EDT by telephone and verified that I am speaking with the correct person using two identifiers.   I discussed the limitations, risks, security and privacy concerns of performing an evaluation and management service by telephone and the availability of in person appointments. Staff also discussed with the patient that there may be a patient responsible charge related to this service.  Patient verbalized understanding and agreed to proceed with encounter. Patient Location: home Provider Location: out of clinic private office Additional Individuals present: none  HPI Wishes to stop smoking and would like help/meds/patches She was inpt and off smoking, inpt she was given patches and they seemed to help, but she did not get the patches when she went home (outpt)  Nicorette gum not working and is not tolerable She is currently smoking 1/2 ppd No hx of seizures or mood disorder, moods good      Patient Active Problem List   Diagnosis Date Noted   Internal carotid artery stenosis, right 03/01/2023   Hemiparesis affecting left side as late effect of stroke (HCC) 02/15/2023   Right middle cerebral artery stroke (HCC) 02/02/2023   Stroke (HCC) 01/27/2023   Internal carotid artery occlusion, right 01/27/2023   Coagulation disorder (HCC) 01/01/2022   Anticoagulated on Coumadin 02/05/2020   History of gout 06/23/2019   Coronary artery disease 02/16/2019   Benign neoplasm of cecum    Benign neoplasm of transverse colon    Polyp of sigmoid colon    External hemorrhoids    Hip arthritis 10/27/2017   Encounter for monitoring Coumadin therapy 02/27/2015   PAD (peripheral artery disease) (HCC)    Essential hypertension    Mixed hyperlipidemia     Tobacco abuse    Hyperuricemia     Social History   Tobacco Use   Smoking status: Every Day    Current packs/day: 0.50    Average packs/day: 0.5 packs/day for 40.0 years (20.0 ttl pk-yrs)    Types: Cigarettes   Smokeless tobacco: Never   Tobacco comments:    Down to less than 1/2 pack per day as of 02/25/23  Substance Use Topics   Alcohol use: No     Current Outpatient Medications:    acetaminophen (TYLENOL) 325 MG tablet, Take 2 tablets (650 mg total) by mouth every 4 (four) hours as needed for mild pain (or temp > 37.5 C (99.5 F))., Disp: , Rfl:    amLODipine (NORVASC) 5 MG tablet, Take 1 tablet (5 mg total) by mouth daily., Disp: 30 tablet, Rfl: 0   aspirin 81 MG chewable tablet, Chew 1 tablet (81 mg total) by mouth daily., Disp: 30 tablet, Rfl: 1   atorvastatin (LIPITOR) 10 MG tablet, Take 1 tablet (10 mg total) by mouth at bedtime., Disp: 90 tablet, Rfl: 3   Blood Glucose Monitoring Suppl DEVI, 1 each by Does not apply route daily as needed. May substitute to any manufacturer covered by patient's insurance., Disp: 1 each, Rfl: 0   cholecalciferol (VITAMIN D3) 25 MCG (1000 UNIT) tablet, Take 2 tablets (2,000 Units total) by mouth daily., Disp: 90 tablet, Rfl: 1   clopidogrel (PLAVIX) 75 MG tablet, Take 1 tablet (75 mg  total) by mouth daily., Disp: 90 tablet, Rfl: 1   Glucose Blood (BLOOD GLUCOSE TEST STRIPS) STRP, Use strips with glucometer daily as needed for blood sugar monitoring.  May substitute to any manufacturer covered by patient's insurance., Disp: 25 strip, Rfl: 5   Lancets Misc. MISC, Use lancets with glucometer daily prn for blood sugar monitoring.  May substitute to any manufacturer covered by patient's insurance., Disp: 30 each, Rfl: 5   nicotine (NICODERM CQ - DOSED IN MG/24 HR) 7 mg/24hr patch, 7 mg patch daily x 1 week and stop (Patient not taking: Reported on 04/22/2023), Disp: 7 patch, Rfl: 0  No Known Allergies  Chart Review: I personally reviewed active  problem list, medication list, allergies, family history, social history, health maintenance, notes from last encounter, lab results, imaging with the patient/caregiver today.   Review of Systems  Constitutional: Negative.   HENT: Negative.    Eyes: Negative.   Respiratory: Negative.    Cardiovascular: Negative.   Gastrointestinal: Negative.   Endocrine: Negative.   Genitourinary: Negative.   Musculoskeletal: Negative.   Skin: Negative.   Allergic/Immunologic: Negative.   Neurological: Negative.   Hematological: Negative.   Psychiatric/Behavioral: Negative.    All other systems reviewed and are negative.    Objective:    Virtual encounter, vitals limited, only able to obtain the following There were no vitals filed for this visit. There is no height or weight on file to calculate BMI. Nursing Note and Vital Signs reviewed.  Physical Exam Vitals and nursing note reviewed.  Neurological:     Mental Status: She is alert.  Psychiatric:        Mood and Affect: Mood normal.     PE limited by telephone encounter  No results found for this or any previous visit (from the past 72 hour(s)).  Assessment and Plan:     ICD-10-CM   1. Encounter for smoking cessation counseling  Z71.6 Varenicline Tartrate, Starter, (CHANTIX STARTING MONTH PAK) 0.5 MG X 11 & 1 MG X 42 TBPK    varenicline (CHANTIX CONTINUING MONTH PAK) 1 MG tablet    nicotine (NICODERM CQ - DOSED IN MG/24 HOURS) 14 mg/24hr patch    nicotine (NICODERM CQ - DOSED IN MG/24 HR) 7 mg/24hr patch    2. Tobacco abuse  Z72.0 Varenicline Tartrate, Starter, (CHANTIX STARTING MONTH PAK) 0.5 MG X 11 & 1 MG X 42 TBPK    varenicline (CHANTIX CONTINUING MONTH PAK) 1 MG tablet    nicotine (NICODERM CQ - DOSED IN MG/24 HOURS) 14 mg/24hr patch    nicotine (NICODERM CQ - DOSED IN MG/24 HR) 7 mg/24hr patch    3. Current every day smoker  F17.200 Varenicline Tartrate, Starter, (CHANTIX STARTING MONTH PAK) 0.5 MG X 11 & 1 MG X 42  TBPK    varenicline (CHANTIX CONTINUING MONTH PAK) 1 MG tablet    nicotine (NICODERM CQ - DOSED IN MG/24 HOURS) 14 mg/24hr patch    nicotine (NICODERM CQ - DOSED IN MG/24 HR) 7 mg/24hr patch     Smoking cessation instruction/counseling given:  counseled patient on the dangers of tobacco use, advised patient to stop smoking, and reviewed strategies to maximize success More than 15 min today spent on smoking cessation counseling We discussed mutliple med options, patches/gum, wellbutrin, chantix and how to use and approach stopping smoking, other resources reviewed and offered Chantix med SE reviewed at [facility], how to take and how to approach stopping smoking (start meds 1 week prior to stop smoking  date - best to stop or sig reduce amount of cigarettes between day 8 and    - I discussed the assessment and treatment plan with the patient. The patient was provided an opportunity to ask questions and all were answered. The patient agreed with the plan and demonstrated an understanding of the instructions.  - The patient was advised to call back or seek an in-person evaluation if the symptoms worsen or if the condition fails to improve as anticipated.  I provided 25+ minutes of non-face-to-face time during this encounter.  Danelle Berry, PA-C 04/22/23 10:02 AM

## 2023-04-22 NOTE — Patient Instructions (Signed)
Steps to Quit Smoking Smoking tobacco is the leading cause of preventable death. It can affect almost every organ in the body. Smoking puts you and people around you at risk for many serious, long-lasting (chronic) diseases. Quitting smoking can be hard, but it is one of the best things that you can do for your health. It is never too late to quit. Do not give up if you cannot quit the first time. Some people need to try many times to quit. Do your best to stick to your quit plan, and talk with your doctor if you have any questions or concerns. How do I get ready to quit? Pick a date to quit. Set a date within the next 2 weeks to give you time to prepare. Write down the reasons why you are quitting. Keep this list in places where you will see it often. Tell your family, friends, and co-workers that you are quitting. Their support is important. Talk with your doctor about the choices that may help you quit. Find out if your health insurance will pay for these treatments. Know the people, places, things, and activities that make you want to smoke (triggers). Avoid them. What first steps can I take to quit smoking? Throw away all cigarettes at home, at work, and in your car. Throw away the things that you use when you smoke, such as ashtrays and lighters. Clean your car. Empty the ashtray. Clean your home, including curtains and carpets. What can I do to help me quit smoking? Talk with your doctor about taking medicines and seeing a counselor. You are more likely to succeed when you do both. If you are pregnant or breastfeeding: Talk with your doctor about counseling or other ways to quit smoking. Do not take medicine to help you quit smoking unless your doctor tells you to. Quit right away Quit smoking completely, instead of slowly cutting back on how much you smoke over a period of time. Stopping smoking right away may be more successful than slowly quitting. Go to counseling. In-person is best  if this is an option. You are more likely to quit if you go to counseling sessions regularly. Take medicine You may take medicines to help you quit. Some medicines need a prescription, and some you can buy over-the-counter. Some medicines may contain a drug called nicotine to replace the nicotine in cigarettes. Medicines may: Help you stop having the desire to smoke (cravings). Help to stop the problems that come when you stop smoking (withdrawal symptoms). Your doctor may ask you to use: Nicotine patches, gum, or lozenges. Nicotine inhalers or sprays. Non-nicotine medicine that you take by mouth. Find resources Find resources and other ways to help you quit smoking and remain smoke-free after you quit. They include: Online chats with a Veterinary surgeon. Phone quitlines. Printed Materials engineer. Support groups or group counseling. Text messaging programs. Mobile phone apps. Use apps on your mobile phone or tablet that can help you stick to your quit plan. Examples of free services include Quit Guide from the CDC and smokefree.gov  What can I do to make it easier to quit?  Talk to your family and friends. Ask them to support and encourage you. Call a phone quitline, such as 1-800-QUIT-NOW, reach out to support groups, or work with a Veterinary surgeon. Ask people who smoke to not smoke around you. Avoid places that make you want to smoke, such as: Bars. Parties. Smoke-break areas at work. Spend time with people who do not smoke. Lower  the stress in your life. Stress can make you want to smoke. Try these things to lower stress: Getting regular exercise. Doing deep-breathing exercises. Doing yoga. Meditating. What benefits will I see if I quit smoking? Over time, you may have: A better sense of smell and taste. Less coughing and sore throat. A slower heart rate. Lower blood pressure. Clearer skin. Better breathing. Fewer sick days. Summary Quitting smoking can be hard, but it is one of  the best things that you can do for your health. Do not give up if you cannot quit the first time. Some people need to try many times to quit. When you decide to quit smoking, make a plan to help you succeed. Quit smoking right away, not slowly over a period of time. When you start quitting, get help and support to keep you smoke-free. This information is not intended to replace advice given to you by your health care provider. Make sure you discuss any questions you have with your health care provider. Document Revised: 08/08/2021 Document Reviewed: 08/08/2021 Elsevier Patient Education  2024 [employer].  Drug information about Chantix from [employer]:  Smoking cessation therapies are more likely to succeed for patients who are motivated to stop smoking and who are provided additional advice and support. Provide patients with appropriate educational materials and counseling to support the quit attempt.  The patient should set a date to stop smoking. Begin CHANTIX dosing one week before this date. Alternatively, the patient can begin CHANTIX dosing and then quit smoking between days 8 and 35 of treatment.  CHANTIX should be taken orally after eating and with a full glass of water. The recommended dose of CHANTIX is 1 mg twice daily following a 1-week titration as follows:  Days 1 - 3: 0.5 mg once daily  Days 4 - 7: 0.5 mg twice daily  Day 8 - end of treatment: 1 mg twice daily   Patients should be treated with CHANTIX for 12 weeks. For patients who have successfully stopped smoking at the end of 12 weeks, an additional course of 12 weeks treatment with CHANTIX is recommended to further increase the likelihood of long-term abstinence. For patients who are sure that they are not able or willing to quit abruptly, consider a gradual approach to quitting smoking with CHANTIX. Patients should begin CHANTIX dosing and reduce smoking by 50% from baseline within the first four weeks, by an additional  50% in the next four weeks, and continue reducing with the goal of reaching complete abstinence by 12 weeks. Continue CHANTIX treatment for an additional 12 weeks, for a total of 24 weeks of treatment. Encourage patients to attempt quitting sooner if they feel ready [see Clinical Studies (14.5)]. Patients who are motivated to quit, and who did not succeed in stopping smoking during prior CHANTIX therapy for reasons other than intolerability due to adverse events or who relapsed after treatment, should be encouraged to make another attempt with CHANTIX once factors contributing to the failed attempt have been identified and addressed. Consider a temporary or permanent dose reduction in patients who cannot tolerate the adverse effects of CHANTIX.

## 2023-04-23 ENCOUNTER — Ambulatory Visit: Payer: Medicare PPO

## 2023-04-23 VITALS — Ht 64.0 in | Wt 113.0 lb

## 2023-04-23 DIAGNOSIS — Z0000 Encounter for general adult medical examination without abnormal findings: Secondary | ICD-10-CM

## 2023-04-23 DIAGNOSIS — Z1211 Encounter for screening for malignant neoplasm of colon: Secondary | ICD-10-CM

## 2023-04-23 NOTE — Patient Instructions (Signed)
Ms. Poteat , Thank you for taking time to come for your Medicare Wellness Visit. I appreciate your ongoing commitment to your health goals. Please review the following plan we discussed and let me know if I can assist you in the future.   Referrals/Orders/Follow-Ups/Clinician Recommendations: none  This is a list of the screening recommended for you and due dates:  Health Maintenance  Topic Date Due   DEXA scan (bone density measurement)  Never done   Flu Shot  04/01/2023   Colon Cancer Screening  07/07/2023   COVID-19 Vaccine (4 - 2023-24 season) 05/08/2023*   Zoster (Shingles) Vaccine (1 of 2) 05/18/2023*   Screening for Lung Cancer  10/07/2023*   Mammogram  09/02/2023   Medicare Annual Wellness Visit  04/22/2024   DTaP/Tdap/Td vaccine (2 - Td or Tdap) 09/28/2027   Pneumonia Vaccine  Completed   Hepatitis C Screening  Completed   HPV Vaccine  Aged Out  *Topic was postponed. The date shown is not the original due date.    Advanced directives: (Declined) Advance directive discussed with you today. Even though you declined this today, please call our office should you change your mind, and we can give you the proper paperwork for you to fill out.  Next Medicare Annual Wellness Visit scheduled for next year: Yes 04/27/24 @ 11:45am telephone

## 2023-04-23 NOTE — Addendum Note (Signed)
Addended by: Sue Lush on: 04/23/2023 12:15 PM   Modules accepted: Orders

## 2023-04-23 NOTE — Progress Notes (Cosign Needed Addendum)
Subjective:   Mary [name] is a 72 y.o. female who presents for Medicare Annual (Subsequent) preventive examination.  Visit Complete: Virtual  I connected with  [name] on 04/23/23 by a audio enabled telemedicine application and verified that I am speaking with the correct person using two identifiers.  Patient Location: Home  Provider Location: Office/Clinic  I discussed the limitations of evaluation and management by telemedicine. The patient expressed understanding and agreed to proceed.  Vital Signs: Unable to obtain new vitals due to this being a telehealth visit.  Patient Medicare AWV questionnaire was completed by the patient on (not done); I have confirmed that all information answered by patient is correct and no changes since this date.  Review of Systems    Cardiac Risk Factors include: advanced age (>43men, >77 women);dyslipidemia;hypertension;sedentary lifestyle;smoking/ tobacco exposure    Objective:    Today's Vitals   04/23/23 1132  Weight: 113 lb (51.3 kg)  Height: 5\' 4"  (1.626 m)   Body mass index is 19.4 kg/m.     04/23/2023   12:02 PM 03/01/2023    6:00 PM 02/02/2023   12:40 PM 01/29/2023    9:27 PM 09/29/2021    2:22 PM 02/20/2021   10:05 AM 02/20/2020   10:24 AM  Advanced Directives  Does Patient Have a Medical Advance Directive? No No No No No No No  Would patient like information on creating a medical advance directive?  No - Patient declined No - Patient declined No - Patient declined  No - Patient declined Yes (MAU/Ambulatory/Procedural Areas - Information given)    Current Medications (verified) Outpatient Encounter Medications as of 04/23/2023  Medication Sig   acetaminophen (TYLENOL) 325 MG tablet Take 2 tablets (650 mg total) by mouth every 4 (four) hours as needed for mild pain (or temp > 37.5 C (99.5 F)).   amLODipine (NORVASC) 5 MG tablet Take 1 tablet (5 mg total) by mouth daily.   aspirin 81 MG chewable tablet Chew 1 tablet (81 mg  total) by mouth daily.   atorvastatin (LIPITOR) 10 MG tablet Take 1 tablet (10 mg total) by mouth at bedtime.   Blood Glucose Monitoring Suppl DEVI 1 each by Does not apply route daily as needed. May substitute to any manufacturer covered by patient's insurance.   cholecalciferol (VITAMIN D3) 25 MCG (1000 UNIT) tablet Take 2 tablets (2,000 Units total) by mouth daily.   clopidogrel (PLAVIX) 75 MG tablet Take 1 tablet (75 mg total) by mouth daily.   Glucose Blood (BLOOD GLUCOSE TEST STRIPS) STRP Use strips with glucometer daily as needed for blood sugar monitoring.  May substitute to any manufacturer covered by patient's insurance.   Lancets Misc. MISC Use lancets with glucometer daily prn for blood sugar monitoring.  May substitute to any manufacturer covered by patient's insurance.   nicotine (NICODERM CQ - DOSED IN MG/24 HOURS) 14 mg/24hr patch Place 1 patch (14 mg total) onto the skin daily.   [START ON 05/23/2023] nicotine (NICODERM CQ - DOSED IN MG/24 HR) 7 mg/24hr patch Place 1 patch (7 mg total) onto the skin daily.   [START ON 05/31/2023] varenicline (CHANTIX CONTINUING MONTH PAK) 1 MG tablet Take 1 tablet (1 mg total) by mouth 2 (two) times daily.   [START ON 05/03/2023] Varenicline Tartrate, Starter, (CHANTIX STARTING MONTH PAK) 0.5 MG X 11 & 1 MG X 42 TBPK Take 0.5 mg tablet by mouth 1x daily for 3 days, then increase to 0.5 mg tablet 2x daily for  4 days, then increase to 1 mg tablet 2x daily.   No facility-administered encounter medications on file as of 04/23/2023.    Allergies (verified) Patient has no known allergies.   History: Past Medical History:  Diagnosis Date   Anemia    Arthritis    Coronary artery disease    Hyperlipidemia    Hypertension    Hyperuricemia    Osteoporosis    PAD (peripheral artery disease) (HCC) 02/2010   angiplasty, popliteal artery   Pre-diabetes    Stroke [facility])    Tobacco abuse    Past Surgical History:  Procedure Laterality Date   ANGIOPLASTY   July 2011   PAD- popliteal artery   COLONOSCOPY WITH PROPOFOL N/A 01/13/2018   Procedure: COLONOSCOPY WITH PROPOFOL;  Surgeon: Toney Reil, MD;  Location: [facility] ENDOSCOPY;  Service: Gastroenterology;  Laterality: N/A;   COLONOSCOPY WITH PROPOFOL N/A 01/14/2018   Procedure: COLONOSCOPY WITH PROPOFOL;  Surgeon: Pasty Spillers, MD;  Location: [facility] ENDOSCOPY;  Service: Endoscopy;  Laterality: N/A;   COLONOSCOPY WITH PROPOFOL N/A 07/06/2018   Procedure: COLONOSCOPY WITH PROPOFOL;  Surgeon: Pasty Spillers, MD;  Location: [facility] ENDOSCOPY;  Service: Endoscopy;  Laterality: N/A;   CORONARY ANGIOPLASTY     IR ANGIO INTRA EXTRACRAN SEL COM CAROTID INNOMINATE UNI L MOD SED  03/01/2023   IR ANGIO INTRA EXTRACRAN SEL INTERNAL CAROTID UNI R MOD SED  03/01/2023   IR CT HEAD LTD  01/27/2023   IR CT HEAD LTD  03/01/2023   IR INTRAVSC STENT CERV CAROTID W/EMB-PROT MOD SED INCL ANGIO  03/01/2023   IR PERCUTANEOUS ART THROMBECTOMY/INFUSION INTRACRANIAL INC DIAG ANGIO  01/27/2023   IR RADIOLOGIST EVAL & MGMT  04/07/2023   JOINT REPLACEMENT     Leg Bypass Surgery Right 08/2010   OVARIAN CYST SURGERY  1980s   POPLITEAL ARTERY STENT Right 05/2010   POPLITEAL ARTERY STENT Right 07/2010   RADIOLOGY WITH ANESTHESIA N/A 01/27/2023   Procedure: IR WITH ANESTHESIA;  Surgeon: Radiologist, Medication, MD;  Location: [facility] OR;  Service: Radiology;  Laterality: N/A;   RADIOLOGY WITH ANESTHESIA Right 03/01/2023   Procedure: Right carotid stent;  Surgeon: Julieanne Cotton, MD;  Location[facility] OR;  Service: Radiology;  Laterality: Right;   TOTAL HIP ARTHROPLASTY Left 10/27/2017   Procedure: TOTAL HIP ARTHROPLASTY;  Surgeon: Deeann Saint, MD;  Location: [facility] ORS;  Service: Orthopedics;  Laterality: Left;   Family History  Problem Relation Age of Onset   Arthritis Mother    Diabetes Mother    Stroke Mother    Hypertension Mother    Dementia Mother    Cancer Father    Arthritis Sister    Seizures Sister    Diabetes Sister     Hypertension Sister    COPD Sister    Cancer Brother    Stroke Maternal Grandmother    Diabetes Paternal Grandmother    Cancer Sister    Diabetes Sister    Arthritis Sister    Diabetes Brother    Stroke Brother    Healthy Brother    Diabetes Brother    Stroke Brother    Cancer Brother    Heart disease Neg Hx    Breast cancer Neg Hx    Social History   Socioeconomic History   Marital status: Married    Spouse name: Joien   Number of children: 0   Years of education: Not on file   Highest education level: 12th grade  Occupational History   Occupation: Disabled  Tobacco Use   Smoking status: Every Day    Current packs/day: 0.50    Average packs/day: 0.5 packs/day for 40.0 years (20.0 ttl pk-yrs)    Types: Cigarettes   Smokeless tobacco: Never   Tobacco comments:    Down to less than 1/2 pack per day as of 02/25/23  Vaping Use   Vaping status: Never Used  Substance and Sexual Activity   Alcohol use: No   Drug use: No   Sexual activity: Yes  Other Topics Concern   Not on file  Social History Narrative   Not on file   Social Determinants of Health   Financial Resource Strain: Low Risk  (04/23/2023)   Overall Financial Resource Strain (CARDIA)    Difficulty of Paying Living Expenses: Not hard at all  Food Insecurity: No Food Insecurity (04/23/2023)   Hunger Vital Sign    Worried About Running Out of Food in the Last Year: Never true    Ran Out of Food in the Last Year: Never true  Transportation Needs: No Transportation Needs (04/23/2023)   PRAPARE - Administrator, Civil Service (Medical): No    Lack of Transportation (Non-Medical): No  Physical Activity: Sufficiently Active (04/23/2023)   Exercise Vital Sign    Days of Exercise per Week: 5 days    Minutes of Exercise per Session: 30 min  Stress: No Stress Concern Present (04/23/2023)   [employer] of Occupational Health - Occupational Stress Questionnaire    Feeling of Stress : Only a little   Social Connections: Socially Isolated (04/23/2023)   Social Connection and Isolation Panel [NHANES]    Frequency of Communication with Friends and Family: Once a week    Frequency of Social Gatherings with Friends and Family: Once a week    Attends Religious Services: Never    Database administrator or Organizations: No    Attends Engineer, structural: Never    Marital Status: Married    Tobacco Counseling Ready to quit: Not Answered Counseling given: Not Answered Tobacco comments: Down to less than 1/2 pack per day as of 02/25/23   Clinical Intake:  Pre-visit preparation completed: Yes  Pain : No/denies pain   BMI - recorded: 19.4 Nutritional Status: BMI of 19-24  Normal Nutritional Risks: None Diabetes: No  How often do you need to have someone help you when you read instructions, pamphlets, or other written materials from your doctor or pharmacy?: 1 - Never  Interpreter Needed?: No  Comments: lives with husband Information entered by :: B.Etrulia Zarr,LPN   Activities of Daily Living    04/23/2023   12:03 PM 04/22/2023    8:54 AM  In your present state of health, do you have any difficulty performing the following activities:  Hearing? 0 0  Vision? 0 0  Difficulty concentrating or making decisions? 0 0  Walking or climbing stairs? 0 0  Dressing or bathing? 0 0  Doing errands, shopping? 0 0  Preparing Food and eating ? N   Using the Toilet? N   In the past six months, have you accidently leaked urine? N   Do you have problems with loss of bowel control? N   Managing your Medications? N   Managing your Finances? N   Housekeeping or managing your Housekeeping? N     Patient Care Team: Danelle Berry, PA-C as PCP - General (Family Medicine) Wyn Quaker, Marlow Baars, MD as Referring Physician (Vascular Surgery) Gaspar Cola, [facility] (Inactive) (Pharmacist) Pa, [facility] (Optometry)  Indicate any recent Medical Services you may have received from other than  [facility] providers in the past year (date may be approximate).     Assessment:   This is a routine wellness examination for Sole.  Hearing/Vision screen Hearing Screening - Comments:: Adequate hearing Vision Screening - Comments:: Adequate vision;readers only [facility]  Dietary issues and exercise activities discussed:     Goals Addressed             This Visit's Progress    DIET - INCREASE WATER INTAKE   On track    Recommend to drink at least 6-8 8oz glasses of water per day.     Quit Smoking   Not on track    If you wish to quit smoking, help is available. For free tobacco cessation program offerings call the [facility] at 3641655877 or Live Well Line at (782)173-9795. You may also visit [email or web address] or email [email or web address] for more information on other programs.         Depression Screen    04/23/2023   11:47 AM 04/22/2023    8:54 AM 03/15/2023   10:33 AM 02/15/2023   10:49 AM 01/04/2023   11:01 AM 10/06/2022    9:46 AM 07/06/2022   10:31 AM  PHQ 2/9 Scores  PHQ - 2 Score 0 0 0 0 0 0 0  PHQ- 9 Score  0 0 0 0 0     Fall Risk    04/23/2023   11:38 AM 04/22/2023    8:54 AM 03/15/2023   10:33 AM 02/15/2023   10:48 AM 01/04/2023   11:01 AM  Fall Risk   Falls in the past year? 0 1 1 1  0  Number falls in past yr: 0 1 1 1  0  Injury with Fall? 0 1 1 1  0  Risk for fall due to : No Fall Risks Impaired balance/gait Impaired balance/gait Impaired balance/gait No Fall Risks  Follow up Education provided;Falls prevention discussed Falls prevention discussed;Education provided;Falls evaluation completed Falls prevention discussed;Education provided;Falls evaluation completed Falls prevention discussed;Education provided;Falls evaluation completed Falls prevention discussed;Education provided;Falls evaluation completed    MEDICARE RISK AT HOME: Medicare Risk at Home Any stairs in or around the home?: Yes If so, are there any without  handrails?: Yes Home free of loose throw rugs in walkways, pet beds, electrical cords, etc?: Yes Adequate lighting in your home to reduce risk of falls?: Yes Life alert?: No Use of a cane, walker or w/c?: No Grab bars in the bathroom?: Yes Shower chair or bench in shower?: No Elevated toilet seat or a handicapped toilet?: No  TIMED UP AND GO:  Was the test performed?  No    Cognitive Function:        04/23/2023   12:07 PM 02/20/2020   10:26 AM 02/16/2019    9:58 AM 02/10/2018   10:28 AM  6CIT Screen  What Year? 0 points 0 points 0 points 0 points  What month? 0 points 0 points 0 points 0 points  What time? 0 points 0 points 0 points 0 points  Count back from 20 0 points 0 points 0 points 0 points  Months in reverse 0 points 0 points 0 points 2 points  Repeat phrase 0 points 0 points 0 points 2 points  Total Score 0 points 0 points 0 points 4 points    Immunizations Immunization History  Administered Date(s) Administered   [employer] Vaccination 10/28/2019, 11/25/2019  PFIZER(Purple Top)SARS-COV-2 Vaccination 08/11/2021   PNEUMOCOCCAL CONJUGATE-20 10/06/2022   Tdap 09/27/2017    TDAP status: Up to date  Flu Vaccine status: Declined, Education has been provided regarding the importance of this vaccine but patient still declined. Advised may receive this vaccine at local pharmacy or Health Dept. Aware to provide a copy of the vaccination record if obtained from local pharmacy or Health Dept. Verbalized acceptance and understanding.  Pneumococcal vaccine status: Up to date  Covid-19 vaccine status: Completed vaccines  Qualifies for Shingles Vaccine? Yes   Zostavax completed Yes   Shingrix Completed?: Yes  Screening Tests Health Maintenance  Topic Date Due   DEXA SCAN  Never done   INFLUENZA VACCINE  04/01/2023   Colonoscopy  07/07/2023   COVID-19 Vaccine (4 - 2023-24 season) 05/08/2023 (Originally 05/01/2022)   Zoster Vaccines- Shingrix (1 of 2) 05/18/2023  (Originally 04/08/2003)   Lung Cancer Screening  10/07/2023 (Originally 04/08/2003)   MAMMOGRAM  09/02/2023   Medicare Annual Wellness (AWV)  04/22/2024   DTaP/Tdap/Td (2 - Td or Tdap) 09/28/2027   Pneumonia Vaccine 79+ Years old  Completed   Hepatitis C Screening  Completed   HPV VACCINES  Aged Out    Health Maintenance  Health Maintenance Due  Topic Date Due   DEXA SCAN  Never done   INFLUENZA VACCINE  04/01/2023   Colonoscopy  07/07/2023    Colorectal cancer screening: Referral to GI placed yes. Pt aware the office will call re: appt.  Mammogram status: Completed yes. Repeat every year  Bone Density: order already placed:TBS  Lung Cancer Screening: (Low Dose CT Chest recommended if Age 9-80 years, 20 pack-year currently smoking OR have quit w/in 15years.) does not qualify.   Lung Cancer Screening Referral: no  Additional Screening:  Hepatitis C Screening: does not qualify; Completed yes  Vision Screening: Recommended annual ophthalmology exams for early detection of glaucoma and other disorders of the eye. Is the patient up to date with their annual eye exam?  Yes  Who is the provider or what is the name of the office in which the patient attends annual eye exams? [facility] If pt is not established with a provider, would they like to be referred to a provider to establish care? No .   Dental Screening: Recommended annual dental exams for proper oral hygiene  Diabetic Foot Exam: n/a  Community Resource Referral / Chronic Care Management: CRR required this visit?  No   CCM required this visit?  No    Plan:     I have personally reviewed and noted the following in the patient's chart:   Medical and social history Use of alcohol, tobacco or illicit drugs  Current medications and supplements including opioid prescriptions. Patient is not currently taking opioid prescriptions. Functional ability and status Nutritional status Physical activity Advanced  directives List of other physicians Hospitalizations, surgeries, and ER visits in previous 12 months Vitals Screenings to include cognitive, depression, and falls Referrals and appointments  In addition, I have reviewed and discussed with patient certain preventive protocols, quality metrics, and best practice recommendations. A written personalized care plan for preventive services as well as general preventive health recommendations were provided to patient.     Sue Lush, LPN   4/33/2951   After Visit Summary: (Declined) Due to this being a telephonic visit, with patients personalized plan was offered to patient but patient Declined AVS at this time   Nurse Notes: The patient states she is doing well and  has no concerns or questions at this time.  *Referral for colonoscopy

## 2023-04-30 DIAGNOSIS — L02212 Cutaneous abscess of back [any part, except buttock]: Secondary | ICD-10-CM | POA: Diagnosis not present

## 2023-05-05 ENCOUNTER — Encounter: Payer: Self-pay | Admitting: *Deleted

## 2023-05-13 ENCOUNTER — Other Ambulatory Visit: Payer: Self-pay

## 2023-05-13 NOTE — Patient Outreach (Signed)
First telephone outreach attempt to obtain mRS. No answer. Left message for returned call.  Philmore Pali [facility] Management Assistant [phone]

## 2023-05-17 ENCOUNTER — Other Ambulatory Visit: Payer: Self-pay

## 2023-05-17 NOTE — Patient Outreach (Signed)
Second telephone outreach attempt to obtain mRS. No answer. Left message for returned call.  Vanice Sarah [facility] Management Assistant [phone]

## 2023-05-18 ENCOUNTER — Ambulatory Visit: Payer: Medicare HMO | Admitting: Family Medicine

## 2023-05-18 ENCOUNTER — Other Ambulatory Visit: Payer: Self-pay

## 2023-05-18 DIAGNOSIS — R7303 Prediabetes: Secondary | ICD-10-CM

## 2023-05-18 DIAGNOSIS — E782 Mixed hyperlipidemia: Secondary | ICD-10-CM

## 2023-05-18 DIAGNOSIS — D689 Coagulation defect, unspecified: Secondary | ICD-10-CM

## 2023-05-18 DIAGNOSIS — I10 Essential (primary) hypertension: Secondary | ICD-10-CM

## 2023-05-18 NOTE — Patient Outreach (Signed)
Telephone outreach to patient to obtain mRS was successfully completed. MRS= 0  Venola Castello [facility] Care Management Assistant [phone]  

## 2023-06-03 ENCOUNTER — Other Ambulatory Visit: Payer: Self-pay | Admitting: Family Medicine

## 2023-06-03 DIAGNOSIS — I10 Essential (primary) hypertension: Secondary | ICD-10-CM

## 2023-06-03 NOTE — Telephone Encounter (Signed)
Need f/u appt. 

## 2023-06-14 ENCOUNTER — Encounter (INDEPENDENT_AMBULATORY_CARE_PROVIDER_SITE_OTHER): Payer: Medicare HMO | Admitting: Vascular Surgery

## 2023-06-14 ENCOUNTER — Encounter (INDEPENDENT_AMBULATORY_CARE_PROVIDER_SITE_OTHER): Payer: Medicare HMO

## 2023-06-14 NOTE — Progress Notes (Deleted)
MRN : [id number]  Mary [name] is a 72 y.o. (10/03/1950) female who presents with chief complaint of check circulation.  History of Present Illness:    The patient is seen for evaluation of painful lower extremities and diminished pulses. Patient notes the pain is always associated with activity and is very consistent day today. Typically, the pain occurs at less than one block, progress is as activity continues to the point that the patient must stop walking. Resting including standing still for several minutes allows the patient to walk a similar distance before being forced to stop again. Uneven terrain and inclines shorten the distance. The pain has been progressive over the past several years. The patient denies any abrupt changes in claudication symptoms.  The patient states the inability to walk is causing problems with daily activities.  The patient denies rest pain or dangling of an extremity off the side of the bed during the night for relief. No open wounds or sores at this time. No prior interventions or surgeries.  No history of back problems or DJD of the lumbar sacral spine.   The patient also has a history of atherosclerotic occlusive disease of the carotid arteries.  Procedure 01/27/2023:  Thrombectomy right proximal ICA (Deveshwar)  Procedure 03/01/2023:     The patient's blood pressure has been stable and relatively well controlled.  The patient denies history of DVT, PE or superficial thrombophlebitis. The patient denies recent episodes of angina or shortness of breath.   No outpatient medications have been marked as taking for the 06/14/23 encounter (Appointment) with Gilda Crease, Latina Craver, MD.    Past Medical History:  Diagnosis Date   Anemia    Arthritis    Coronary artery disease    Hyperlipidemia    Hypertension    Hyperuricemia    Osteoporosis    PAD (peripheral artery disease) (HCC)  02/2010   angiplasty, popliteal artery   Pre-diabetes    Stroke [facility])    Tobacco abuse     Past Surgical History:  Procedure Laterality Date   ANGIOPLASTY  July 2011   PAD- popliteal artery   COLONOSCOPY WITH PROPOFOL N/A 01/13/2018   Procedure: COLONOSCOPY WITH PROPOFOL;  Surgeon: Toney Reil, MD;  Location: [facility] ENDOSCOPY;  Service: Gastroenterology;  Laterality: N/A;   COLONOSCOPY WITH PROPOFOL N/A 01/14/2018   Procedure: COLONOSCOPY WITH PROPOFOL;  Surgeon: Pasty Spillers, MD;  Location: [facility] ENDOSCOPY;  Service: Endoscopy;  Laterality: N/A;   COLONOSCOPY WITH PROPOFOL N/A 07/06/2018   Procedure: COLONOSCOPY WITH PROPOFOL;  Surgeon: Pasty Spillers, MD;  Location: [facility] ENDOSCOPY;  Service: Endoscopy;  Laterality: N/A;   CORONARY ANGIOPLASTY     IR ANGIO INTRA EXTRACRAN SEL COM CAROTID INNOMINATE UNI L MOD SED  03/01/2023   IR ANGIO INTRA EXTRACRAN SEL INTERNAL CAROTID UNI R MOD SED  03/01/2023   IR CT HEAD LTD  01/27/2023   IR CT HEAD LTD  03/01/2023   IR INTRAVSC STENT CERV CAROTID W/EMB-PROT MOD SED INCL ANGIO  03/01/2023   IR PERCUTANEOUS ART THROMBECTOMY/INFUSION INTRACRANIAL INC DIAG ANGIO  01/27/2023   IR  RADIOLOGIST EVAL & MGMT  04/07/2023   JOINT REPLACEMENT     Leg Bypass Surgery Right 08/2010   OVARIAN CYST SURGERY  1980s   POPLITEAL ARTERY STENT Right 05/2010   POPLITEAL ARTERY STENT Right 07/2010   RADIOLOGY WITH ANESTHESIA N/A 01/27/2023   Procedure: IR WITH ANESTHESIA;  Surgeon: Radiologist, Medication, MD;  Location: [facility] OR;  Service: Radiology;  Laterality: N/A;   RADIOLOGY WITH ANESTHESIA Right 03/01/2023   Procedure: Right carotid stent;  Surgeon: Julieanne Cotton, MD;  Location: [facility] OR;  Service: Radiology;  Laterality: Right;   TOTAL HIP ARTHROPLASTY Left 10/27/2017   Procedure: TOTAL HIP ARTHROPLASTY;  Surgeon: Deeann Saint, MD;  Location: [facility] ORS;  Service: Orthopedics;  Laterality: Left;    Social History Social History   Tobacco Use   Smoking  status: Every Day    Current packs/day: 0.50    Average packs/day: 0.5 packs/day for 40.0 years (20.0 ttl pk-yrs)    Types: Cigarettes   Smokeless tobacco: Never   Tobacco comments:    Down to less than 1/2 pack per day as of 02/25/23  Vaping Use   Vaping status: Never Used  Substance Use Topics   Alcohol use: No   Drug use: No    Family History Family History  Problem Relation Age of Onset   Arthritis Mother    Diabetes Mother    Stroke Mother    Hypertension Mother    Dementia Mother    Cancer Father    Arthritis Sister    Seizures Sister    Diabetes Sister    Hypertension Sister    COPD Sister    Cancer Brother    Stroke Maternal Grandmother    Diabetes Paternal Grandmother    Cancer Sister    Diabetes Sister    Arthritis Sister    Diabetes Brother    Stroke Brother    Healthy Brother    Diabetes Brother    Stroke Brother    Cancer Brother    Heart disease Neg Hx    Breast cancer Neg Hx     No Known Allergies   REVIEW OF SYSTEMS (Negative unless checked)  Constitutional: [] Weight loss  [] Fever  [] Chills Cardiac: [] Chest pain   [] Chest pressure   [] Palpitations   [] Shortness of breath when laying flat   [] Shortness of breath with exertion. Vascular:  [x] Pain in legs with walking   [] Pain in legs at rest  [] History of DVT   [] Phlebitis   [] Swelling in legs   [] Varicose veins   [] Non-healing ulcers Pulmonary:   [] Uses home oxygen   [] Productive cough   [] Hemoptysis   [] Wheeze  [] COPD   [] Asthma Neurologic:  [] Dizziness   [] Seizures   [] History of stroke   [] History of TIA  [] Aphasia   [] Vissual changes   [] Weakness or numbness in arm   [] Weakness or numbness in leg Musculoskeletal:   [] Joint swelling   [] Joint pain   [] Low back pain Hematologic:  [] Easy bruising  [] Easy bleeding   [] Hypercoagulable state   [] Anemic Gastrointestinal:  [] Diarrhea   [] Vomiting  [] Gastroesophageal reflux/heartburn   [] Difficulty swallowing. Genitourinary:  [] Chronic kidney disease    [] Difficult urination  [] Frequent urination   [] Blood in urine Skin:  [] Rashes   [] Ulcers  Psychological:  [] History of anxiety   []  History of major depression.  Physical Examination  There were no vitals filed for this visit. There is no height or weight on file to calculate BMI. Gen: WD/WN, NAD Head: [place]/AT,  No temporalis wasting.  Ear/Nose/Throat: Hearing grossly intact, nares w/o erythema or drainage Eyes: PER, EOMI, sclera nonicteric.  Neck: Supple, no masses.  No bruit or JVD.  Pulmonary:  Good air movement, no audible wheezing, no use of accessory muscles.  Cardiac: RRR, normal S1, S2, no Murmurs. Vascular:  mild trophic changes, no open wounds Vessel Right Left  Radial Palpable Palpable  PT Not Palpable Not Palpable  DP Not Palpable Not Palpable  Gastrointestinal: soft, non-distended. No guarding/no peritoneal signs.  Musculoskeletal: M/S 5/5 throughout.  No visible deformity.  Neurologic: CN 2-12 intact. Pain and light touch intact in extremities.  Symmetrical.  Speech is fluent. Motor exam as listed above. Psychiatric: Judgment intact, Mood & affect appropriate for pt's clinical situation. Dermatologic: No rashes or ulcers noted.  No changes consistent with cellulitis.   CBC Lab Results  Component Value Date   WBC 7.5 03/02/2023   HGB 10.0 (L) 03/02/2023   HCT 30.9 (L) 03/02/2023   MCV 89.6 03/02/2023   PLT 301 03/02/2023    BMET    Component Value Date/Time   NA 138 03/02/2023 0500   NA 142 10/17/2015 1017   NA 138 01/21/2012 0740   K 3.5 03/02/2023 0500   K 2.8 (L) 01/21/2012 0740   CL 106 03/02/2023 0500   CL 98 01/21/2012 0740   CO2 24 03/02/2023 0500   CO2 30 01/21/2012 0740   GLUCOSE 110 (H) 03/02/2023 0500   GLUCOSE 117 (H) 01/21/2012 0740   BUN 7 (L) 03/02/2023 0500   BUN 6 (L) 10/17/2015 1017   BUN 9 01/21/2012 0740   CREATININE 0.76 03/02/2023 0500   CREATININE 0.78 02/15/2023 1139   CALCIUM 8.5 (L) 03/02/2023 0500   CALCIUM 9.3 01/21/2012  0740   GFRNONAA >60 03/02/2023 0500   GFRNONAA 77 01/09/2021 1133   GFRAA 90 01/09/2021 1133   CrCl cannot be calculated (Patient's most recent lab result is older than the maximum 21 days allowed.).  COAG Lab Results  Component Value Date   INR 1.0 03/01/2023   INR 1.2 02/05/2023   INR 7.2 (HH) 02/02/2023    Radiology No results found.   Assessment/Plan There are no diagnoses linked to this encounter.   Levora Dredge, MD  06/14/2023 1:05 PM

## 2023-06-17 ENCOUNTER — Other Ambulatory Visit: Payer: Self-pay

## 2023-06-17 ENCOUNTER — Ambulatory Visit: Payer: Medicare HMO | Admitting: Nurse Practitioner

## 2023-06-17 ENCOUNTER — Encounter: Payer: Self-pay | Admitting: Nurse Practitioner

## 2023-06-17 VITALS — BP 118/82 | HR 96 | Temp 97.9°F | Resp 16 | Ht 64.0 in | Wt 112.8 lb

## 2023-06-17 DIAGNOSIS — I739 Peripheral vascular disease, unspecified: Secondary | ICD-10-CM | POA: Diagnosis not present

## 2023-06-17 DIAGNOSIS — I2510 Atherosclerotic heart disease of native coronary artery without angina pectoris: Secondary | ICD-10-CM

## 2023-06-17 DIAGNOSIS — Z23 Encounter for immunization: Secondary | ICD-10-CM

## 2023-06-17 DIAGNOSIS — E782 Mixed hyperlipidemia: Secondary | ICD-10-CM

## 2023-06-17 DIAGNOSIS — D689 Coagulation defect, unspecified: Secondary | ICD-10-CM | POA: Diagnosis not present

## 2023-06-17 DIAGNOSIS — I10 Essential (primary) hypertension: Secondary | ICD-10-CM

## 2023-06-17 DIAGNOSIS — I6521 Occlusion and stenosis of right carotid artery: Secondary | ICD-10-CM

## 2023-06-17 DIAGNOSIS — Z8673 Personal history of transient ischemic attack (TIA), and cerebral infarction without residual deficits: Secondary | ICD-10-CM | POA: Insufficient documentation

## 2023-06-17 LAB — POCT INR
INR: 1.1 — AB (ref 2.0–3.0)
POC INR: 13.1

## 2023-06-17 NOTE — Assessment & Plan Note (Signed)
Continue aspirin and plavix, atorvastatin 10 mg daily

## 2023-06-17 NOTE — Progress Notes (Signed)
BP 118/82   Pulse 96   Temp 97.9 F (36.6 C) (Oral)   Resp 16   Ht 5\' 4"  (1.626 m)   Wt 112 lb 12.8 oz (51.2 kg)   SpO2 99%   BMI 19.36 kg/m    Subjective:    Patient ID: Mary [name], female    DOB: 1951/04/12, 72 y.o.   MRN: [id number]  HPI: Mary [name] is a 72 y.o. female  Chief Complaint  Patient presents with   Anticoagulation   Hypertension:  -Medications: amlodipine 5 mg daily -Patient is compliant with above medications and reports no side effects. -Checking BP at home (average): 118 s -Denies any SOB, CP, vision changes, LE edema or symptoms of hypotension -Diet: recommend DASH diet  -Exercise: recommend 150 min of physical activity weekly       06/17/2023   10:13 AM 04/23/2023   11:32 AM 03/15/2023   10:34 AM  Vitals with BMI  Height 5\' 4"  5\' 4"  5\' 4"   Weight 112 lbs 13 oz 113 lbs 113 lbs 6 oz  BMI 19.35 19.39 19.46  Systolic 118 -- 134  Diastolic 82 -- 76  Pulse 96  98    HLD/CAD/atherosclerosis:  -Medications: atorvastatin 10 mg daily -Patient is compliant with above medications and reports no side effects.  -Last lipid panel:  Lipid Panel     Component Value Date/Time   CHOL 115 01/28/2023 0250   CHOL 145 10/17/2015 1017   TRIG 57 01/28/2023 0250   HDL 51 01/28/2023 0250   HDL 43 10/17/2015 1017   CHOLHDL 2.3 01/28/2023 0250   VLDL 11 01/28/2023 0250   LDLCALC 53 01/28/2023 0250   LDLCALC 55 10/06/2022 1038   LABVLDL 36 10/17/2015 1017     PAD/Coagulation therapy/ coagulation disorder/history of CVA: Her last INR was 1.0 on 03/01/2023.  INR today is 1.1. patient was taken off warfarin and is now taking plavix and asa.   She has had multiple right lower extremity revascularizations by Dr. Kenard Gower vascular surgery.  She has been on long-term warfarin therapy.  She denies any changes in her mentation, headaches or abnormal bleeding.  She had a stroke in June of 2024.  Looks like patient has not been seen by neurology.  According to referral they  have been trying to get a hold of patient without success. Will place new referral.     Relevant past medical, surgical, family and social history reviewed and updated as indicated. Interim medical history since our last visit reviewed. Allergies and medications reviewed and updated.  Review of Systems  Constitutional: Negative for fever or weight change.  Respiratory: Negative for cough and shortness of breath.   Cardiovascular: Negative for chest pain or palpitations.  Gastrointestinal: Negative for abdominal pain, no bowel changes.  Musculoskeletal: Negative for gait problem or joint swelling.  Skin: Negative for rash.  Neurological: Negative for dizziness or headache.  No other specific complaints in a complete review of systems (except as listed in HPI above).      Objective:    BP 118/82   Pulse 96   Temp 97.9 F (36.6 C) (Oral)   Resp 16   Ht 5\' 4"  (1.626 m)   Wt 112 lb 12.8 oz (51.2 kg)   SpO2 99%   BMI 19.36 kg/m   Wt Readings from Last 3 Encounters:  06/17/23 112 lb 12.8 oz (51.2 kg)  04/23/23 113 lb (51.3 kg)  03/15/23 113 lb 6.4 oz (51.4 kg)  Physical Exam  Constitutional: Patient appears well-developed and well-nourished.  No distress.  HEENT: head atraumatic, normocephalic, pupils equal and reactive to light,  neck supple Cardiovascular: Normal rate, regular rhythm and normal heart sounds.  No murmur heard. No BLE edema. Pulmonary/Chest: Effort normal and breath sounds normal. No respiratory distress. Abdominal: Soft.  There is no tenderness. Psychiatric: Patient has a normal mood and affect. behavior is normal. Judgment and thought content normal.      Assessment & Plan:   Problem List Items Addressed This Visit       Cardiovascular and Mediastinum   PAD (peripheral artery disease) (HCC) (Chronic)    Continue aspirin and plavix      Relevant Orders   POCT INR (Completed)   Essential hypertension - Primary    Continue amlodipine 5 mg daily       Coronary artery disease    Continue atorvastatin 10 mg daily, Continue aspirin and plavix      Atherosclerosis of right carotid artery    Continue aspirin and plavix, atorvastatin 10 mg daily        Hematopoietic and Hemostatic   Coagulation disorder (HCC)    Taken off coumadin, currently taking asa and plavix      Relevant Orders   POCT INR (Completed)     Other   Mixed hyperlipidemia    Continue atorvastatin 10 mg daily      History of stroke    Patient has not been seen by neurology since discharge. New referral placed.       Relevant Orders   Ambulatory referral to Neurology   Other Visit Diagnoses     Need for influenza vaccination       Relevant Orders   Flu Vaccine Trivalent High Dose (Fluad) (Completed)       Will get labs at next visit.   Follow up plan: Return in about 6 months (around 12/16/2023) for follow up.

## 2023-06-17 NOTE — Assessment & Plan Note (Signed)
Continue atorvastatin 10 mg daily.

## 2023-06-17 NOTE — Assessment & Plan Note (Signed)
-  Continue amlodipine 5 mg daily

## 2023-06-17 NOTE — Assessment & Plan Note (Signed)
Continue atorvastatin 10 mg daily, Continue aspirin and plavix

## 2023-06-17 NOTE — Assessment & Plan Note (Signed)
Patient has not been seen by neurology since discharge. New referral placed.

## 2023-06-17 NOTE — Assessment & Plan Note (Signed)
Taken off coumadin, currently taking asa and plavix

## 2023-06-17 NOTE — Assessment & Plan Note (Signed)
Continue aspirin and plavix.

## 2023-07-19 ENCOUNTER — Ambulatory Visit (INDEPENDENT_AMBULATORY_CARE_PROVIDER_SITE_OTHER): Payer: Medicare HMO | Admitting: Vascular Surgery

## 2023-07-19 ENCOUNTER — Ambulatory Visit (INDEPENDENT_AMBULATORY_CARE_PROVIDER_SITE_OTHER): Payer: Medicare HMO

## 2023-07-19 ENCOUNTER — Encounter (INDEPENDENT_AMBULATORY_CARE_PROVIDER_SITE_OTHER): Payer: Self-pay | Admitting: Vascular Surgery

## 2023-07-19 ENCOUNTER — Other Ambulatory Visit (INDEPENDENT_AMBULATORY_CARE_PROVIDER_SITE_OTHER): Payer: Self-pay | Admitting: Vascular Surgery

## 2023-07-19 VITALS — BP 130/86 | HR 92 | Resp 18 | Ht 66.0 in | Wt 113.2 lb

## 2023-07-19 DIAGNOSIS — I6529 Occlusion and stenosis of unspecified carotid artery: Secondary | ICD-10-CM | POA: Insufficient documentation

## 2023-07-19 DIAGNOSIS — I10 Essential (primary) hypertension: Secondary | ICD-10-CM | POA: Diagnosis not present

## 2023-07-19 DIAGNOSIS — E782 Mixed hyperlipidemia: Secondary | ICD-10-CM | POA: Diagnosis not present

## 2023-07-19 DIAGNOSIS — Z9889 Other specified postprocedural states: Secondary | ICD-10-CM | POA: Diagnosis not present

## 2023-07-19 DIAGNOSIS — I6523 Occlusion and stenosis of bilateral carotid arteries: Secondary | ICD-10-CM

## 2023-07-19 DIAGNOSIS — I70219 Atherosclerosis of native arteries of extremities with intermittent claudication, unspecified extremity: Secondary | ICD-10-CM | POA: Insufficient documentation

## 2023-07-19 DIAGNOSIS — I70213 Atherosclerosis of native arteries of extremities with intermittent claudication, bilateral legs: Secondary | ICD-10-CM

## 2023-07-19 DIAGNOSIS — I2510 Atherosclerotic heart disease of native coronary artery without angina pectoris: Secondary | ICD-10-CM

## 2023-07-19 DIAGNOSIS — I739 Peripheral vascular disease, unspecified: Secondary | ICD-10-CM | POA: Diagnosis not present

## 2023-07-19 LAB — VAS US LOWER EXT ART SEG MULTI (SEGMENTALS & LE RAYNAUDS)
Left ABI: 0.55
Right ABI: 0.63

## 2023-07-19 NOTE — Progress Notes (Signed)
MRN : [id number]  Mary [name] is a 72 y.o. (05-21-1951) female who presents with chief complaint of check circulation.  History of Present Illness:   The patient returns to the office for followup regarding atherosclerotic changes of the lower extremities and review of the noninvasive studies.   There have been no interval changes in lower extremity symptoms. No interval shortening of the patient's claudication distance or development of rest pain symptoms. No new ulcers or wounds have occurred since the last visit.  There have been no significant changes to the patient's overall health care.  The patient denies amaurosis fugax or recent TIA symptoms. There are no documented recent neurological changes noted. There is no history of DVT, PE or superficial thrombophlebitis. The patient denies recent episodes of angina or shortness of breath.   ABI Rt=0.63 and Lt=0.55  (previous ABI's Rt=0.59 and Lt=0.51)    Current Meds  Medication Sig   acetaminophen (TYLENOL) 325 MG tablet Take 2 tablets (650 mg total) by mouth every 4 (four) hours as needed for mild pain (or temp > 37.5 C (99.5 F)).   amLODipine (NORVASC) 5 MG tablet TAKE 1 TABLET EVERY DAY   aspirin 81 MG chewable tablet Chew 1 tablet (81 mg total) by mouth daily.   atorvastatin (LIPITOR) 10 MG tablet Take 1 tablet (10 mg total) by mouth at bedtime.   Blood Glucose Monitoring Suppl DEVI 1 each by Does not apply route daily as needed. May substitute to any manufacturer covered by patient's insurance.   cholecalciferol (VITAMIN D3) 25 MCG (1000 UNIT) tablet Take 2 tablets (2,000 Units total) by mouth daily.   clopidogrel (PLAVIX) 75 MG tablet Take 1 tablet (75 mg total) by mouth daily.   nicotine (NICODERM CQ - DOSED IN MG/24 HOURS) 14 mg/24hr patch Place 1 patch (14 mg total) onto the skin daily.   nicotine (NICODERM CQ - DOSED IN MG/24 HR) 7 mg/24hr patch Place  1 patch (7 mg total) onto the skin daily.   varenicline (CHANTIX CONTINUING MONTH PAK) 1 MG tablet Take 1 tablet (1 mg total) by mouth 2 (two) times daily.   Varenicline Tartrate, Starter, (CHANTIX STARTING MONTH PAK) 0.5 MG X 11 & 1 MG X 42 TBPK Take 0.5 mg tablet by mouth 1x daily for 3 days, then increase to 0.5 mg tablet 2x daily for 4 days, then increase to 1 mg tablet 2x daily.    Past Medical History:  Diagnosis Date   Anemia    Arthritis    Coronary artery disease    Hyperlipidemia    Hypertension    Hyperuricemia    Osteoporosis    PAD (peripheral artery disease) (HCC) 02/2010   angiplasty, popliteal artery   Pre-diabetes    Stroke [facility])    Tobacco abuse     Past Surgical History:  Procedure Laterality Date   ANGIOPLASTY  July 2011   PAD- popliteal artery   COLONOSCOPY WITH PROPOFOL N/A 01/13/2018   Procedure: COLONOSCOPY WITH PROPOFOL;  Surgeon: Toney Reil, MD;  Location: [facility] ENDOSCOPY;  Service: Gastroenterology;  Laterality: N/A;  COLONOSCOPY WITH PROPOFOL N/A 01/14/2018   Procedure: COLONOSCOPY WITH PROPOFOL;  Surgeon: Pasty Spillers, MD;  Location: [facility] ENDOSCOPY;  Service: Endoscopy;  Laterality: N/A;   COLONOSCOPY WITH PROPOFOL N/A 07/06/2018   Procedure: COLONOSCOPY WITH PROPOFOL;  Surgeon: Pasty Spillers, MD;  Location: [facility] ENDOSCOPY;  Service: Endoscopy;  Laterality: N/A;   CORONARY ANGIOPLASTY     IR ANGIO INTRA EXTRACRAN SEL COM CAROTID INNOMINATE UNI L MOD SED  03/01/2023   IR ANGIO INTRA EXTRACRAN SEL INTERNAL CAROTID UNI R MOD SED  03/01/2023   IR CT HEAD LTD  01/27/2023   IR CT HEAD LTD  03/01/2023   IR INTRAVSC STENT CERV CAROTID W/EMB-PROT MOD SED INCL ANGIO  03/01/2023   IR PERCUTANEOUS ART THROMBECTOMY/INFUSION INTRACRANIAL INC DIAG ANGIO  01/27/2023   IR RADIOLOGIST EVAL & MGMT  04/07/2023   JOINT REPLACEMENT     Leg Bypass Surgery Right 08/2010   OVARIAN CYST SURGERY  1980s   POPLITEAL ARTERY STENT Right 05/2010   POPLITEAL ARTERY STENT  Right 07/2010   RADIOLOGY WITH ANESTHESIA N/A 01/27/2023   Procedure: IR WITH ANESTHESIA;  Surgeon: Radiologist, Medication, MD;  Location: [facility] OR;  Service: Radiology;  Laterality: N/A;   RADIOLOGY WITH ANESTHESIA Right 03/01/2023   Procedure: Right carotid stent;  Surgeon: Julieanne Cotton, MD;  Location: [facility] OR;  Service: Radiology;  Laterality: Right;   TOTAL HIP ARTHROPLASTY Left 10/27/2017   Procedure: TOTAL HIP ARTHROPLASTY;  Surgeon: Deeann Saint, MD;  Location: [facility] ORS;  Service: Orthopedics;  Laterality: Left;    Social History Social History   Tobacco Use   Smoking status: Every Day    Current packs/day: 0.50    Average packs/day: 0.5 packs/day for 40.0 years (20.0 ttl pk-yrs)    Types: Cigarettes   Smokeless tobacco: Never   Tobacco comments:    Down to less than 1/2 pack per day as of 02/25/23  Vaping Use   Vaping status: Never Used  Substance Use Topics   Alcohol use: No   Drug use: No    Family History Family History  Problem Relation Age of Onset   Arthritis Mother    Diabetes Mother    Stroke Mother    Hypertension Mother    Dementia Mother    Cancer Father    Arthritis Sister    Seizures Sister    Diabetes Sister    Hypertension Sister    COPD Sister    Cancer Brother    Stroke Maternal Grandmother    Diabetes Paternal Grandmother    Cancer Sister    Diabetes Sister    Arthritis Sister    Diabetes Brother    Stroke Brother    Healthy Brother    Diabetes Brother    Stroke Brother    Cancer Brother    Heart disease Neg Hx    Breast cancer Neg Hx     No Known Allergies   REVIEW OF SYSTEMS (Negative unless checked)  Constitutional: [] Weight loss  [] Fever  [] Chills Cardiac: [] Chest pain   [] Chest pressure   [] Palpitations   [] Shortness of breath when laying flat   [] Shortness of breath with exertion. Vascular:  [x] Pain in legs with walking   [] Pain in legs at rest  [] History of DVT   [] Phlebitis   [] Swelling in legs   [] Varicose veins    [] Non-healing ulcers Pulmonary:   [] Uses home oxygen   [] Productive cough   [] Hemoptysis   [] Wheeze  [] COPD   [] Asthma Neurologic:  [] Dizziness   []   Seizures   [] History of stroke   [] History of TIA  [] Aphasia   [] Vissual changes   [] Weakness or numbness in arm   [] Weakness or numbness in leg Musculoskeletal:   [] Joint swelling   [] Joint pain   [] Low back pain Hematologic:  [] Easy bruising  [] Easy bleeding   [] Hypercoagulable state   [] Anemic Gastrointestinal:  [] Diarrhea   [] Vomiting  [] Gastroesophageal reflux/heartburn   [] Difficulty swallowing. Genitourinary:  [] Chronic kidney disease   [] Difficult urination  [] Frequent urination   [] Blood in urine Skin:  [] Rashes   [] Ulcers  Psychological:  [] History of anxiety   []  History of major depression.  Physical Examination  Vitals:   07/19/23 1544  BP: 130/86  Pulse: 92  Resp: 18  Weight: 113 lb 3.2 oz (51.3 kg)  Height: 5\' 6"  (1.676 m)   Body mass index is 18.27 kg/m. Gen: WD/WN, NAD Head: [place]/AT, No temporalis wasting.  Ear/Nose/Throat: Hearing grossly intact, nares w/o erythema or drainage Eyes: PER, EOMI, sclera nonicteric.  Neck: Supple, no masses.  No bruit or JVD.  Pulmonary:  Good air movement, no audible wheezing, no use of accessory muscles.  Cardiac: RRR, normal S1, S2, no Murmurs. Vascular:  mild trophic changes, no open wounds Vessel Right Left  Radial Palpable Palpable  PT Not Palpable Not Palpable  DP Not Palpable Not Palpable  Gastrointestinal: soft, non-distended. No guarding/no peritoneal signs.  Musculoskeletal: M/S 5/5 throughout.  No visible deformity.  Neurologic: CN 2-12 intact. Pain and light touch intact in extremities.  Symmetrical.  Speech is fluent. Motor exam as listed above. Psychiatric: Judgment intact, Mood & affect appropriate for pt's clinical situation. Dermatologic: No rashes or ulcers noted.  No changes consistent with cellulitis.   CBC Lab Results  Component Value Date   WBC 7.5 03/02/2023    HGB 10.0 (L) 03/02/2023   HCT 30.9 (L) 03/02/2023   MCV 89.6 03/02/2023   PLT 301 03/02/2023    BMET    Component Value Date/Time   NA 138 03/02/2023 0500   NA 142 10/17/2015 1017   NA 138 01/21/2012 0740   K 3.5 03/02/2023 0500   K 2.8 (L) 01/21/2012 0740   CL 106 03/02/2023 0500   CL 98 01/21/2012 0740   CO2 24 03/02/2023 0500   CO2 30 01/21/2012 0740   GLUCOSE 110 (H) 03/02/2023 0500   GLUCOSE 117 (H) 01/21/2012 0740   BUN 7 (L) 03/02/2023 0500   BUN 6 (L) 10/17/2015 1017   BUN 9 01/21/2012 0740   CREATININE 0.76 03/02/2023 0500   CREATININE 0.78 02/15/2023 1139   CALCIUM 8.5 (L) 03/02/2023 0500   CALCIUM 9.3 01/21/2012 0740   GFRNONAA >60 03/02/2023 0500   GFRNONAA 77 01/09/2021 1133   GFRAA 90 01/09/2021 1133   CrCl cannot be calculated (Patient's most recent lab result is older than the maximum 21 days allowed.).  COAG Lab Results  Component Value Date   INR 1.1 (A) 06/17/2023   INR 13.1 06/17/2023   INR 1.0 03/01/2023    Radiology VAS [place] LE ART SEG MULTI (Segm&LE Reynauds)  Result Date: 07/19/2023  LOWER EXTREMITY DOPPLER STUDY Patient Name:  JULEY HOPSON  Date of Exam:   07/19/2023 Medical Rec #: [id number]      Accession #:    [id number] Date of Birth: 12-May-1951       Patient Gender: F Patient Age:   75 years Exam Location:  [facility] Procedure:      VAS [place] ABI WITH/WO  TBI Referring Phys: Sheppard Plumber --------------------------------------------------------------------------------  Indications: Peripheral artery disease.  Performing Technologist: Jamse Mead RT, RDMS, RVT  Examination Guidelines: A complete evaluation includes at minimum, Doppler waveform signals and systolic blood pressure reading at the level of bilateral brachial, anterior tibial, and posterior tibial arteries, when vessel segments are accessible. Bilateral testing is considered an integral part of a complete examination. Photoelectric Plethysmograph (PPG) waveforms and toe  systolic pressure readings are included as required and additional duplex testing as needed. Limited examinations for reoccurring indications may be performed as noted.  ABI Findings: +---------+------------------+-----+------------------+---------------------+ Right    Rt Pressure (mmHg)IndexWaveform          Comment               +---------+------------------+-----+------------------+---------------------+ Brachial 138                                                            +---------+------------------+-----+------------------+---------------------+ CFA                             triphasic                               +---------+------------------+-----+------------------+---------------------+ Popliteal                       possible occlusioncollateral flow noted +---------+------------------+-----+------------------+---------------------+ PTA      87                0.61 monophasic                              +---------+------------------+-----+------------------+---------------------+ DP       90                0.63 monophasic                              +---------+------------------+-----+------------------+---------------------+ Great Toe45                0.31 Abnormal                                +---------+------------------+-----+------------------+---------------------+ +---------+------------------+-----+----------+-------+ Left     Lt Pressure (mmHg)IndexWaveform  Comment +---------+------------------+-----+----------+-------+ Brachial 143                                      +---------+------------------+-----+----------+-------+ CFA                             triphasic         +---------+------------------+-----+----------+-------+ Popliteal                       monophasic        +---------+------------------+-----+----------+-------+ PTA      76                0.53 monophasic         +---------+------------------+-----+----------+-------+ DP  79                0.55 monophasic        +---------+------------------+-----+----------+-------+ Great Toe52                0.36 Abnormal          +---------+------------------+-----+----------+-------+ +-------+-----------+-----------+------------+------------+ ABI/TBIToday's ABIToday's TBIPrevious ABIPrevious TBI +-------+-----------+-----------+------------+------------+ Right  0.63       0.31       0.59        0.37         +-------+-----------+-----------+------------+------------+ Left   0.55       0.36       0.51        0.31         +-------+-----------+-----------+------------+------------+ Bilateral ABIs appear essentially unchanged compared to prior study on 2021.  Summary: Right: Resting right ankle-brachial index and Doppler waveforms indicate moderate right lower extremity arterial disease at the femoral-popliteal level. The right toe-brachial index is abnormal. Left: Resting left ankle-brachial index and Doppler waveforms indicate moderate left lower extremity arterial disease at the femoral-popliteal level. The left toe-brachial index is abnormal. *See table(s) above for measurements and observations.  Electronically signed by Levora Dredge MD on 07/19/2023 at 4:26:57 PM.    Final      Assessment/Plan 1. Atherosclerosis of native artery of both lower extremities with intermittent claudication (HCC)  Recommend:  The patient has evidence of atherosclerosis of the lower extremities with claudication.  The patient does not voice lifestyle limiting changes at this point in time.  Noninvasive studies do not suggest clinically significant change.  No invasive studies, angiography or surgery at this time The patient should continue walking and begin a more formal exercise program.  The patient should continue antiplatelet therapy and aggressive treatment of the lipid abnormalities  No changes in the  patient's medications at this time  Continued surveillance is indicated as atherosclerosis is likely to progress with time.    The patient will continue follow up with noninvasive studies as ordered.  - VAS [place] ABI WITH/WO TBI; Future  2. Bilateral carotid artery stenosis Recommend:  Given the patient's asymptomatic subcritical stenosis no further invasive testing or surgery at this time.  Continue antiplatelet therapy as prescribed Continue management of CAD, HTN and Hyperlipidemia Healthy heart diet,  encouraged exercise at least 4 times per week  Follow up in 6 months with duplex ultrasound and physical exam  - VAS US CAROTID; Future  3. Mixed hyperlipidemia Continue statin as ordered and reviewed, no changes at this time  4. Essential hypertension Continue antihypertensive medications as already ordered, these medications have been reviewed and there are no changes at this time.  5. Coronary artery disease involving native coronary artery of native heart, unspecified whether angina present Continue cardiac and antihypertensive medications as already ordered and reviewed, no changes at this time.  Continue statin as ordered and reviewed, no changes at this time  Nitrates PRN for chest pain    Levora Dredge, MD  07/19/2023 8:53 PM

## 2023-07-23 ENCOUNTER — Other Ambulatory Visit: Payer: Self-pay | Admitting: Family Medicine

## 2023-07-23 DIAGNOSIS — Z1231 Encounter for screening mammogram for malignant neoplasm of breast: Secondary | ICD-10-CM

## 2023-08-15 ENCOUNTER — Other Ambulatory Visit: Payer: Self-pay | Admitting: Family Medicine

## 2023-08-15 DIAGNOSIS — I10 Essential (primary) hypertension: Secondary | ICD-10-CM

## 2023-09-02 ENCOUNTER — Other Ambulatory Visit: Payer: Self-pay | Admitting: Family Medicine

## 2023-09-02 DIAGNOSIS — Z09 Encounter for follow-up examination after completed treatment for conditions other than malignant neoplasm: Secondary | ICD-10-CM

## 2023-09-02 DIAGNOSIS — Z5181 Encounter for therapeutic drug level monitoring: Secondary | ICD-10-CM

## 2023-09-02 DIAGNOSIS — D689 Coagulation defect, unspecified: Secondary | ICD-10-CM

## 2023-09-02 DIAGNOSIS — I69354 Hemiplegia and hemiparesis following cerebral infarction affecting left non-dominant side: Secondary | ICD-10-CM

## 2023-09-02 NOTE — Telephone Encounter (Signed)
 Medication Refill -  Most Recent Primary Care Visit:  Provider: PENDER, JULIE F  Department: [facility]  Visit Type: OFFICE VISIT  Date: 06/17/2023  Medication: clopidogrel  (PLAVIX ) 75 MG tablet   Has the patient contacted their pharmacy? No (Agent: If no, request that the patient contact the pharmacy for the refill. If patient does not wish to contact the pharmacy document the reason why and proceed with request.) (Agent: If yes, when and what did the pharmacy advise?)  Is this the correct pharmacy for this prescription? Yes If no, delete pharmacy and type the correct one.  This is the patient's preferred pharmacy:  [facility] [address] [place] - [address] [place] [address] Phone: [phone] Fax: [phone]     Is the patient out of the medication? No 2 pills left   Has the patient been seen for an appointment in the last year OR does the patient have an upcoming appointment? Yes  Can we respond through MyChart? No  Agent: Please be advised that Rx refills may take up to 3 business days. We ask that you follow-up with your pharmacy.

## 2023-09-06 ENCOUNTER — Ambulatory Visit
Admission: RE | Admit: 2023-09-06 | Discharge: 2023-09-06 | Disposition: A | Discharge status: Home / Self Care | Payer: Medicare HMO | Source: Ambulatory Visit | Attending: Family Medicine | Admitting: Family Medicine

## 2023-09-06 DIAGNOSIS — Z1231 Encounter for screening mammogram for malignant neoplasm of breast: Secondary | ICD-10-CM | POA: Insufficient documentation

## 2023-09-07 ENCOUNTER — Other Ambulatory Visit: Payer: Self-pay | Admitting: Family Medicine

## 2023-09-07 DIAGNOSIS — R928 Other abnormal and inconclusive findings on diagnostic imaging of breast: Secondary | ICD-10-CM

## 2023-09-16 ENCOUNTER — Ambulatory Visit
Admission: RE | Admit: 2023-09-16 | Discharge: 2023-09-16 | Disposition: A | Discharge status: Home / Self Care | Payer: Medicare HMO | Source: Ambulatory Visit | Attending: Family Medicine | Admitting: Family Medicine

## 2023-09-16 DIAGNOSIS — R928 Other abnormal and inconclusive findings on diagnostic imaging of breast: Secondary | ICD-10-CM

## 2023-10-05 NOTE — Progress Notes (Signed)
 Ref Provider: Gareth Mliss Manuel, * PCP: Leavy Mole, PA Assessment and Plan:   In most patients we give written parts of assessment and plan to patient under Patient Instructions/After Visit Summary. So some parts are directed to patient.  Dear [name], It was our pleasure to participate in your care in person. We have typed up brief summary of what we discussed.  Scattered acute infarcts throughout the right cerebral hemisphere, seen on MRI 12/2022, with hospitalization 01/27/2023 - 02/02/2023  Discussed risk factors of strokes.  Denies snoring, will hold off on ordering sleep study.  Endorses routine social activity, a healthy diet, and physical activity (walking dog).  Explained that there is not a significant benefit to taking 2 antiplatelet; instructed patient to stop aspirin  and continue Plavix .  - Reduce TV screen time - Continue maintaining a healthy diet and regular exercise and social activity. We recommend a diet consisting of a variety of green leafy vegetables, fruits, nuts and other whole grain food. Avoid ultra-processed food, soda and artifical sweeteners.  We encouraged patient to stay physically active and exercise on regular basis. Exercise can be very beneficial in many neurological conditions. - patient can stop aspirin  and continue Plavix   Patient should know signs and symptoms of stroke, importance of calling 911 as soon as the symptoms start so patient can be a potential candidate for tPA. We also discussed patient's personal stroke risk factors. We gave written educational material, if needed.  Patient should read about Life's Simple Seven by American Heart Association. 1) Quit/cut back on smoking & avoid excessive alcohol consumption (only applicable in patients who use nicotine  and excessive alcohol) 2) Body mass index less than 25 kg/m2 3) Physical activity at least 150 minutes (Moderate intensity or 75 minutes (vigorous) each week 4) Eat healthy  diet 5) Total cholesterol < 200 mg/dL, LDL < 70 mg/dL 6) Blood pressure < 879/19 mm Hg 7) Fasting blood glucose < 100 mg/dL 8) Have aggressive oral care / maintain good teeth cleaning and good gum health.  Patient should be aware of and recognize stroke symptoms. Patient is encouraged to call 911, if any of these symptoms occure. - Sudden numbness or weakness of the face, arm or leg, especially on one side of the body - Sudden confusion, trouble speaking or understanding - Sudden trouble seeing in one or both eyes - Sudden trouble walking, dizziness, loss of balance or coordination - Sudden severe headache with no known cause For further resources access www.stroke.org   CT Head/CTA head and neck 01/27/2023 -  1. No acute intracranial pathology.  No evidence of evolved infarct. 2. Mural adherent clot in the right internal carotid artery after the bifurcation resulting in high-grade stenosis, estimated at 80%. 3. More proximally in the right internal carotid artery just after the bifurcation, mixed plaque results in proximally 60% stenosis. 4. Mild plaque at the left carotid bifurcation without significant stenosis. Patent vertebral arteries in the neck. 5. Patent intracranial vasculature. 6. Emphysema.   MRI brain 01/27/2023 -  Scattered acute infarcts throughout the right cerebral hemisphere, with more focal areas of infarction in the right frontal and parietal cortex, watershed territory, right caudate and lentiform nucleus, and right occipital lobe.   Carotid US  01/28/2023 -  Right Carotid: Velocities in the right ICA are consistent with a 1-39% stenosis. Left Carotid: Velocities in the left ICA are consistent with a 1-39% stenosis. Vertebrals:  Bilateral vertebral arteries demonstrate antegrade flow.  Subclavians: Normal flow hemodynamics were seen in  bilateral subclavian arteries.   ECHO 01/28/2023 -   1. Left ventricular ejection fraction, by estimation, is 55 to 60%. The left ventricle  has normal function. The left ventricle has no regional wall motion abnormalities. Left ventricular diastolic parameters are consistent with Grade I diastolic dysfunction (impaired relaxation).  2. Right ventricular systolic function is normal. The right ventricular size is normal. Tricuspid regurgitation signal is inadequate for assessing PA pressure.  3. The mitral valve is grossly normal. Trivial mitral valve regurgitation.  4. The aortic valve is tricuspid. There is mild calcification of the aortic valve. There is mild thickening of the aortic valve. Aortic valve regurgitation is not visualized. Aortic valve sclerosis/calcification is present, without any evidence of  aortic stenosis.  5. The inferior vena cava is normal in size with greater than 50% respiratory variability, suggesting right atrial pressure of 3 mmHg.   2. Current smoker  [name] should learn about smoking cessation, including personalized quit plan, long term effects of smoking on nervous system and other body organs, asking for help from friends and family, quit date, how to deal with urges, avoid triggers for smoking, weight management with quitting, medications, reading material, mechanicalarm.dk, 1-800- QUIT NOW etc.   - Order referral to [facility] smoking cessation clinic  Return if symptoms worsen or fail to improve. This note has been created using automated tools and reviewed for accuracy by [facility]. I spent a total of 46 minutes in both face-to-face and non-face-to-face activities, excluding procedures performed, for this visit on the date of this encounter.  History and Present Illness:   [name] is a left  handed 73 y.o. female retiree here for evaluation of Cerebrovascular Accident , referred by Gareth Mliss Manuel, *.  Patient [facility] to [facility] on 01/27/2023 with left sided weakness.  While there they did a stroke work up which was positive for scattered acute infarcts throughout the  right cerebral hemisphere.  She was admitted to  from 01/27/2023 through 02/02/2023. She was started on aspirin  and Plavix .  No residual symptoms.   Mary [name] has known stroke risk factors of hypertension, high cholesterol. CAD, tobacco use.  She reports no known history of atrial fibrillation, sickle cell disease or trait, post-menopausal hormone replacement therapy, drug abuse, migraine with aura, periodontal disease, high CRP, elevated lipoprotein A, increased homocysteine level, peripheral vascular disease, DVT, PE, coagulopathy, Cox II inhibitor use.  She has received Physical Therapy since the event.  CT Head/CTA head and neck 01/27/2023 - 1. No acute intracranial pathology.  No evidence of evolved infarct. 2. Mural adherent clot in the right internal carotid artery after the bifurcation resulting in high-grade stenosis, estimated at 80%. 3. More proximally in the right internal carotid artery just after the bifurcation, mixed plaque results in proximally 60% stenosis. 4. Mild plaque at the left carotid bifurcation without significant stenosis. Patent vertebral arteries in the neck. 5. Patent intracranial vasculature. 6. Emphysema.   MRI brain 01/27/2023 - Scattered acute infarcts throughout the right cerebral hemisphere, with more focal areas of infarction in the right frontal and parietal cortex, watershed territory, right caudate and lentiform nucleus, and right occipital lobe.   Carotid US  01/28/2023 - Right Carotid: Velocities in the right ICA are consistent with a 1-39% stenosis. Left Carotid: Velocities in the left ICA are consistent with a 1-39% stenosis. Vertebrals:  Bilateral vertebral arteries demonstrate antegrade flow.  Subclavians: Normal flow hemodynamics were seen in bilateral subclavian arteries.   ECHO 01/28/2023 -  1. Left ventricular ejection fraction, by estimation, is 55 to 60%. The left ventricle has normal function. The left ventricle has no regional wall motion  abnormalities. Left ventricular diastolic parameters are consistent with Grade I diastolic dysfunction (impaired relaxation).  2. Right ventricular systolic function is normal. The right ventricular size is normal. Tricuspid regurgitation signal is inadequate for assessing PA pressure.  3. The mitral valve is grossly normal. Trivial mitral valve regurgitation.  4. The aortic valve is tricuspid. There is mild calcification of the aortic valve. There is mild thickening of the aortic valve. Aortic valve regurgitation is not visualized. Aortic valve sclerosis/calcification is present, without any evidence of  aortic stenosis.  5. The inferior vena cava is normal in size with greater than 50% respiratory variability, suggesting right atrial pressure of 3 mmHg.   History of Present Illness Mary [name] is a 73 year old female who presents with bilateral foot burning and tingling sensation. She was referred by Dr. Glendia for evaluation of bilateral foot burning and tingling sensation.  She experiences a burning and tingling sensation in both feet, which began after a fall on August 06, 2023, resulting in a bruised tailbone. The sensation is described as 'real bad' and intermittent. Symptoms worsen with walking for about 30 minutes in tennis shoes but improve after removing the shoes and performing exercises. No numbness, back pain, or weakness in her legs or feet is noted.  She has a history of fainting, which occurred at the time of her fall. Following this event, she underwent four different tests as part of the evaluation for fainting.  Results    General Exam:   Vitals:   10/05/23 1517  BP: (!) 147/87  Pulse: 93  Weight: 51.3 kg (113 lb)  Height: 162.6 cm (5' 4)    Body mass index is 19.4 kg/m.  Neurological exam appropriate for the patient's condition was performed.  Physical Exam MUSCULOSKELETAL: Normal muscle strength in lower extremities. NEUROLOGICAL: Negative Babinski sign  bilaterally, 3+ knee jerk reflexes bilaterally, 1+ ankle reflexes bilaterally, negative Hoffman's sign bilaterally, 2+ biceps reflexes bilaterally.  General exam 10/05/23 Bilateral cataracts  Medications: Current Outpatient Medications on File Prior to Visit  Medication Sig Dispense Refill  . amLODIPine  (NORVASC ) 5 MG tablet Take 5 mg by mouth once daily    . ascorbic acid, vitamin C, (VITAMIN C) 500 MG tablet Take 500 mg by mouth once daily    . aspirin  81 MG EC tablet Take 81 mg by mouth once daily    . atorvastatin  (LIPITOR) 10 MG tablet Take 10 mg by mouth once daily.    . cholecalciferol  1000 unit tablet Take by mouth    . clopidogreL  (PLAVIX ) 75 mg tablet Take 75 mg by mouth once daily    . vitamin E  400 UNIT capsule Take 400 Units by mouth once daily.     No current facility-administered medications on file prior to visit.    Past Medical History:  Past Medical History:  Diagnosis Date  . Arthritis   . Coronary artery disease   . History of stroke   . Hyperlipidemia   . Hypertension   . Peripheral vascular disease (CMS-HCC)     Past Surgical History: No past surgical history on file. Family History:  Family History  Problem Relation Name Age of Onset  . High blood pressure (Hypertension) Mother    . Cancer Father    . Cancer Sister    . High blood pressure (Hypertension) Sister    .  Cancer Brother     Social History:  Social History   Socioeconomic History  . Marital status: Married  Tobacco Use  . Smoking status: Every Day  . Smokeless tobacco: Never  Substance and Sexual Activity  . Alcohol use: No  . Drug use: No  . Sexual activity: Yes   Social Drivers of Corporate Investment Banker Strain: High Risk (10/05/2023)   Overall Financial Resource Strain (CARDIA)   . Difficulty of Paying Living Expenses: Hard  Food Insecurity: Food Insecurity Present (10/05/2023)   Hunger Vital Sign   . Worried About Programme Researcher, Broadcasting/film/video in the Last Year: Often true   .  Ran Out of Food in the Last Year: Often true  Transportation Needs: No Transportation Needs (10/05/2023)   PRAPARE - Transportation   . Lack of Transportation (Medical): No   . Lack of Transportation (Non-Medical): No  Physical Activity: Sufficiently Active (04/23/2023)   [facility] from [facility]   Exercise Vital Sign   . Days of Exercise per Week: 5 days   . Minutes of Exercise per Session: 30 min  Stress: No Stress Concern Present (04/23/2023)   Received from [facility] - Occupational Stress Questionnaire   . Feeling of Stress : Only a little  Social Connections: Socially Isolated (04/23/2023)   Received from [facility]   Social Connection and Isolation Panel [NHANES]   . Frequency of Communication with Friends and Family: Once a week   . Frequency of Social Gatherings with Friends and Family: Once a week   . Attends Religious Services: Never   . Active Member of Clubs or Organizations: No   . Attends Banker Meetings: Never   . Marital Status: Married  Housing Stability: Low Risk  (10/05/2023)   Housing Stability Vital Sign   . Unable to Pay for Housing in the Last Year: No   . Number of Times Moved in the Last Year: 0   . Homeless in the Last Year: No   Allergies: No Known Allergies   Dr. Hemang K Shah, MD

## 2023-10-28 ENCOUNTER — Other Ambulatory Visit: Payer: Self-pay | Admitting: Family Medicine

## 2023-10-28 DIAGNOSIS — I10 Essential (primary) hypertension: Secondary | ICD-10-CM

## 2023-11-05 ENCOUNTER — Ambulatory Visit: Admitting: Family Medicine

## 2023-11-08 ENCOUNTER — Ambulatory Visit (INDEPENDENT_AMBULATORY_CARE_PROVIDER_SITE_OTHER): Admitting: Family Medicine

## 2023-11-08 ENCOUNTER — Ambulatory Visit: Payer: Self-pay | Admitting: Family Medicine

## 2023-11-08 ENCOUNTER — Encounter: Payer: Self-pay | Admitting: Family Medicine

## 2023-11-08 VITALS — BP 116/68 | HR 91 | Temp 98.3°F | Resp 16 | Ht 66.0 in | Wt 110.0 lb

## 2023-11-08 DIAGNOSIS — R6889 Other general symptoms and signs: Secondary | ICD-10-CM | POA: Diagnosis not present

## 2023-11-08 DIAGNOSIS — I69354 Hemiplegia and hemiparesis following cerebral infarction affecting left non-dominant side: Secondary | ICD-10-CM

## 2023-11-08 DIAGNOSIS — Z5181 Encounter for therapeutic drug level monitoring: Secondary | ICD-10-CM

## 2023-11-08 DIAGNOSIS — I10 Essential (primary) hypertension: Secondary | ICD-10-CM

## 2023-11-08 DIAGNOSIS — J069 Acute upper respiratory infection, unspecified: Secondary | ICD-10-CM

## 2023-11-08 DIAGNOSIS — E782 Mixed hyperlipidemia: Secondary | ICD-10-CM

## 2023-11-08 DIAGNOSIS — D649 Anemia, unspecified: Secondary | ICD-10-CM | POA: Diagnosis not present

## 2023-11-08 DIAGNOSIS — R7303 Prediabetes: Secondary | ICD-10-CM

## 2023-11-08 LAB — POC COVID19/FLU A&B COMBO
Covid Antigen, POC: NEGATIVE
Influenza A Antigen, POC: NEGATIVE
Influenza B Antigen, POC: NEGATIVE

## 2023-11-08 MED ORDER — BENZONATATE 100 MG PO CAPS
100.0000 mg | ORAL_CAPSULE | Freq: Three times a day (TID) | ORAL | 0 refills | Status: DC | PRN
Start: 2023-11-08 — End: 2024-06-21

## 2023-11-08 MED ORDER — DM-GUAIFENESIN ER 30-600 MG PO TB12
1.0000 | ORAL_TABLET | Freq: Two times a day (BID) | ORAL | 1 refills | Status: DC
Start: 2023-11-08 — End: 2024-06-21

## 2023-11-08 NOTE — Telephone Encounter (Signed)
 Chief Complaint: Cough Symptoms: Productive moderate cough, congestion, runny nose  Frequency: Constant  Pertinent Negatives: Patient denies fever, chest pain, nausea, vomiting Disposition: [] ED /[] Urgent Care (no appt availability in office) / [x] Appointment(In office/virtual)/ []  [place] [facility]/ [] Home Care/ [] Refused Recommended Disposition /[] [place] [facility]/ []  Follow-up with PCP Additional Notes: Patient states she called on Friday to report her symptoms and was offered an appointment but did not want to come in, today patient reports her symptoms are worse that started on Friday and she has tried recommended home care. Care advice was given and patient has been scheduled for an appointment today at 1100.   Summary: cough, nasal drainage   Copied From CRM [id number]. Reason for Triage: Cough, nasal drainage      Reason for Disposition  [1] Continuous (nonstop) coughing interferes with work or school AND [2] no improvement using cough treatment per Care Advice  Answer Assessment - Initial Assessment Questions 1. ONSET: "When did the cough begin?"      Friday  2. SEVERITY: "How bad is the cough today?"      Moderate 3. SPUTUM: "Describe the color of your sputum" (none, dry cough; clear, white, yellow, green)     Clear  4. HEMOPTYSIS: "Are you coughing up any blood?" If so ask: "How much?" (flecks, streaks, tablespoons, etc.)     No  5. DIFFICULTY BREATHING: "Are you having difficulty breathing?" If Yes, ask: "How bad is it?" (e.g., mild, moderate, severe)    - MILD: No SOB at rest, mild SOB with walking, speaks normally in sentences, can lie down, no retractions, pulse < 100.    - MODERATE: SOB at rest, SOB with minimal exertion and prefers to sit, cannot lie down flat, speaks in phrases, mild retractions, audible wheezing, pulse 100-120.    - SEVERE: Very SOB at rest, speaks in single words, struggling to breathe, sitting hunched forward, retractions, pulse > 120       No  6. FEVER: "Do you have a fever?" If Yes, ask: "What is your temperature, how was it measured, and when did it start?"     No  8. LUNG HISTORY: "Do you have any history of lung disease?"  (e.g., pulmonary embolus, asthma, emphysema)     No  9. PE RISK FACTORS: "Do you have a history of blood clots?" (or: recent major surgery, recent prolonged travel, bedridden)     No  10. OTHER SYMPTOMS: "Do you have any other symptoms?" (e.g., runny nose, wheezing, chest pain)       Runny nose, congestion 12. TRAVEL: "Have you traveled out of the country in the last month?" (e.g., travel history, exposures)       No  Protocols used: Cough - Acute Productive-A-AH

## 2023-11-08 NOTE — Progress Notes (Signed)
 Patient ID: Mary [name], female    DOB: Dec 13, 1950, 73 y.o.   MRN: [id number]  PCP: Danelle Berry, PA-C  Chief Complaint  Patient presents with   Cough    productive   Nasal Congestion    X3 days, OTC meds not helping    Subjective:   Mary [name] is a 73 y.o. female, presents to clinic with CC of the following:  HPI  URI sx onset last week (3 d ago), not improving with OTC meds Hx of stroke, smoking. She denies fever, chills, sweats, she is concerned with the nasal drainage, congestion, post-nasal drip, and cough that is productive of scant clear sputum. She denies pain with deep breathing, SOB, wheeze   Prediabetes not on meds Lab Results  Component Value Date   HGBA1C 6.2 (H) 01/28/2023   Weight down a little   Wt Readings from Last 5 Encounters:  11/08/23 110 lb (49.9 kg)  07/19/23 113 lb 3.2 oz (51.3 kg)  06/17/23 112 lb 12.8 oz (51.2 kg)  04/23/23 113 lb (51.3 kg)  03/15/23 113 lb 6.4 oz (51.4 kg)   BMI Readings from Last 5 Encounters:  11/08/23 17.75 kg/m  07/19/23 18.27 kg/m  06/17/23 19.36 kg/m  04/23/23 19.40 kg/m  03/15/23 19.47 kg/m   Hx of PAD, HLD  Lab Results  Component Value Date   CHOL 115 01/28/2023   HDL 51 01/28/2023   LDLCALC 53 01/28/2023   TRIG 57 01/28/2023   CHOLHDL 2.3 01/28/2023   Hypertension:  Currently managed on norvasc 5 Pt reports good med compliance and denies any SE.   Blood pressure today is well controlled. BP Readings from Last 3 Encounters:  11/08/23 116/68  07/19/23 130/86  06/17/23 118/82      Patient Active Problem List   Diagnosis Date Noted   Atherosclerosis of native arteries of extremity with intermittent claudication (HCC) 07/19/2023   Carotid stenosis 07/19/2023   History of stroke 06/17/2023   Atherosclerosis of right carotid artery 03/01/2023   Hemiparesis affecting left side as late effect of stroke (HCC) 02/15/2023   Right middle cerebral artery stroke (HCC) 02/02/2023   Stroke (HCC)  01/27/2023   Internal carotid artery occlusion, right 01/27/2023   Coagulation disorder (HCC) 01/01/2022   Coronary artery disease 02/16/2019   Benign neoplasm of cecum    Benign neoplasm of transverse colon    Polyp of sigmoid colon    External hemorrhoids    Hip arthritis 10/27/2017   PAD (peripheral artery disease) (HCC)    Essential hypertension    Mixed hyperlipidemia    Tobacco abuse    Hyperuricemia       Current Outpatient Medications:    acetaminophen (TYLENOL) 325 MG tablet, Take 2 tablets (650 mg total) by mouth every 4 (four) hours as needed for mild pain (or temp > 37.5 C (99.5 F))., Disp: , Rfl:    amLODipine (NORVASC) 5 MG tablet, TAKE 1 TABLET EVERY DAY, Disp: 90 tablet, Rfl: 0   aspirin 81 MG chewable tablet, Chew 1 tablet (81 mg total) by mouth daily., Disp: 30 tablet, Rfl: 1   atorvastatin (LIPITOR) 10 MG tablet, Take 1 tablet (10 mg total) by mouth at bedtime., Disp: 90 tablet, Rfl: 3   Blood Glucose Monitoring Suppl DEVI, 1 each by Does not apply route daily as needed. May substitute to any manufacturer covered by patient's insurance., Disp: 1 each, Rfl: 0   cholecalciferol (VITAMIN D3) 25 MCG (1000 UNIT) tablet, Take 2  tablets (2,000 Units total) by mouth daily., Disp: 90 tablet, Rfl: 1   clopidogrel (PLAVIX) 75 MG tablet, Take 1 tablet by mouth once daily, Disp: 90 tablet, Rfl: 0   nicotine (NICODERM CQ - DOSED IN MG/24 HOURS) 14 mg/24hr patch, Place 1 patch (14 mg total) onto the skin daily., Disp: 28 patch, Rfl: 1   nicotine (NICODERM CQ - DOSED IN MG/24 HR) 7 mg/24hr patch, Place 1 patch (7 mg total) onto the skin daily., Disp: 28 patch, Rfl: 2   varenicline (CHANTIX CONTINUING MONTH PAK) 1 MG tablet, Take 1 tablet (1 mg total) by mouth 2 (two) times daily., Disp: 60 tablet, Rfl: 4   Varenicline Tartrate, Starter, (CHANTIX STARTING MONTH PAK) 0.5 MG X 11 & 1 MG X 42 TBPK, Take 0.5 mg tablet by mouth 1x daily for 3 days, then increase to 0.5 mg tablet 2x daily  for 4 days, then increase to 1 mg tablet 2x daily., Disp: 53 each, Rfl: 0   Glucose Blood (BLOOD GLUCOSE TEST STRIPS) STRP, Use strips with glucometer daily as needed for blood sugar monitoring.  May substitute to any manufacturer covered by patient's insurance. (Patient not taking: Reported on 06/17/2023), Disp: 25 strip, Rfl: 5   Lancets Misc. MISC, Use lancets with glucometer daily prn for blood sugar monitoring.  May substitute to any manufacturer covered by patient's insurance. (Patient not taking: Reported on 06/17/2023), Disp: 30 each, Rfl: 5   No Known Allergies   Social History   Tobacco Use   Smoking status: Every Day    Current packs/day: 0.50    Average packs/day: 0.5 packs/day for 40.0 years (20.0 ttl pk-yrs)    Types: Cigarettes   Smokeless tobacco: Never   Tobacco comments:    Down to less than 1/2 pack per day as of 02/25/23  Vaping Use   Vaping status: Never Used  Substance Use Topics   Alcohol use: No   Drug use: No      Chart Review Today: I personally reviewed active problem list, medication list, allergies, family history, social history, health maintenance, notes from last encounter, lab results, imaging with the patient/caregiver today.   Review of Systems  Constitutional: Negative.   HENT: Negative.    Eyes: Negative.   Respiratory: Negative.    Cardiovascular: Negative.   Gastrointestinal: Negative.   Endocrine: Negative.   Genitourinary: Negative.   Musculoskeletal: Negative.   Skin: Negative.   Allergic/Immunologic: Negative.   Neurological: Negative.   Hematological: Negative.   Psychiatric/Behavioral: Negative.    All other systems reviewed and are negative.      Objective:   Vitals:   11/08/23 1054  BP: 116/68  Pulse: 91  Resp: 16  Temp: 98.3 F (36.8 C)  SpO2: 97%  Weight: 110 lb (49.9 kg)  Height: 5\' 6"  (1.676 m)    Body mass index is 17.75 kg/m.  Physical Exam Vitals and nursing note reviewed.  Constitutional:       Appearance: She is well-developed.  HENT:     Head: Normocephalic and atraumatic.     Nose: Nose normal.  Eyes:     General:        Right eye: No discharge.        Left eye: No discharge.     Conjunctiva/sclera: Conjunctivae normal.  Neck:     Trachea: No tracheal deviation.  Cardiovascular:     Rate and Rhythm: Normal rate and regular rhythm.  Pulmonary:     Effort: Pulmonary effort is  normal. No respiratory distress.     Breath sounds: No stridor.  Musculoskeletal:        General: Normal range of motion.  Skin:    General: Skin is warm and dry.     Findings: No rash.  Neurological:     Mental Status: She is alert.     Motor: No abnormal muscle tone.     Coordination: Coordination normal.  Psychiatric:        Behavior: Behavior normal.      Results for orders placed or performed in visit on 11/08/23  POC Covid19/Flu A&B Antigen   Collection Time: 11/08/23 11:10 AM  Result Value Ref Range   Influenza A Antigen, POC Negative Negative   Influenza B Antigen, POC Negative Negative   Covid Antigen, POC Negative Negative       Assessment & Plan:   1. Flu-like symptoms (Primary) flu neg, likely viral URI  - POC Covid19/Flu A&B Antigen  2. Upper respiratory tract infection, unspecified type Symptomatic and supportive measures, she can try nasal sprays/2nd generation antihistamines and meds below - dextromethorphan-guaiFENesin (MUCINEX DM) 30-600 MG 12hr tablet; Take 1 tablet by mouth 2 (two) times daily.  Dispense: 60 tablet; Refill: 1 - benzonatate (TESSALON) 100 MG capsule; Take 1 capsule (100 mg total) by mouth 3 (three) times daily as needed for cough.  Dispense: 30 capsule; Refill: 0  3. Anemia, unspecified type Recheck labs - CBC with Differential/Platelet  4. Essential hypertension BP at goal today with amlodipine 5 mg - COMPLETE METABOLIC PANEL WITH GFR  5. Mixed hyperlipidemia On atorvastatin 10, good compliance - COMPLETE METABOLIC PANEL WITH GFR -  Lipid panel  6. Encounter for medication monitoring  - CBC with Differential/Platelet - COMPLETE METABOLIC PANEL WITH GFR - Hemoglobin A1c - Lipid panel  7. Prediabetes Recheck A1C - Hemoglobin A1c  8. Hemiparesis affecting left side as late effect of stroke (HCC) HTN controlled and BP at goal today,, on statin, last lipids at goal, rechecking today Lab Results  Component Value Date   CHOL 115 01/28/2023   HDL 51 01/28/2023   LDLCALC 53 01/28/2023   TRIG 57 01/28/2023   CHOLHDL 2.3 01/28/2023       Danelle Berry, PA-C 11/08/23 11:17 AM

## 2023-11-08 NOTE — Addendum Note (Signed)
 Addended by: Danelle Berry on: 11/08/2023 07:34 PM   Modules accepted: Level of Service

## 2023-11-09 LAB — COMPLETE METABOLIC PANEL WITH GFR
AG Ratio: 1.6 (calc) (ref 1.0–2.5)
ALT: 15 U/L (ref 6–29)
AST: 21 U/L (ref 10–35)
Albumin: 4.5 g/dL (ref 3.6–5.1)
Alkaline phosphatase (APISO): 78 U/L (ref 37–153)
BUN: 11 mg/dL (ref 7–25)
CO2: 28 mmol/L (ref 20–32)
Calcium: 9.6 mg/dL (ref 8.6–10.4)
Chloride: 102 mmol/L (ref 98–110)
Creat: 0.9 mg/dL (ref 0.60–1.00)
Globulin: 2.9 g/dL (ref 1.9–3.7)
Glucose, Bld: 87 mg/dL (ref 65–99)
Potassium: 3.6 mmol/L (ref 3.5–5.3)
Sodium: 140 mmol/L (ref 135–146)
Total Bilirubin: 0.4 mg/dL (ref 0.2–1.2)
Total Protein: 7.4 g/dL (ref 6.1–8.1)
eGFR: 69 mL/min/{1.73_m2} (ref >=60)

## 2023-11-09 LAB — HEMOGLOBIN A1C
Hgb A1c MFr Bld: 6.4 %{Hb} — ABNORMAL HIGH (ref <5.7)
Mean Plasma Glucose: 137 mg/dL
eAG (mmol/L): 7.6 mmol/L

## 2023-11-09 LAB — CBC WITH DIFFERENTIAL/PLATELET
Absolute Lymphocytes: 2218 {cells}/uL (ref 850–3900)
Absolute Monocytes: 586 {cells}/uL (ref 200–950)
Basophils Absolute: 19 {cells}/uL (ref 0–200)
Basophils Relative: 0.3 %
Eosinophils Absolute: 82 {cells}/uL (ref 15–500)
Eosinophils Relative: 1.3 %
HCT: 40.6 % (ref 35.0–45.0)
Hemoglobin: 13.3 g/dL (ref 11.7–15.5)
MCH: 29.2 pg (ref 27.0–33.0)
MCHC: 32.8 g/dL (ref 32.0–36.0)
MCV: 89 fL (ref 80.0–100.0)
MPV: 9.9 fL (ref 7.5–12.5)
Monocytes Relative: 9.3 %
Neutro Abs: 3396 {cells}/uL (ref 1500–7800)
Neutrophils Relative %: 53.9 %
Platelets: 260 10*3/uL (ref 140–400)
RBC: 4.56 10*6/uL (ref 3.80–5.10)
RDW: 12.8 % (ref 11.0–15.0)
Total Lymphocyte: 35.2 %
WBC: 6.3 10*3/uL (ref 3.8–10.8)

## 2023-11-09 LAB — LIPID PANEL
Cholesterol: 122 mg/dL (ref <200)
HDL: 50 mg/dL (ref >=50)
LDL Cholesterol (Calc): 53 mg/dL
Non-HDL Cholesterol (Calc): 72 mg/dL (ref <130)
Total CHOL/HDL Ratio: 2.4 (calc) (ref <5.0)
Triglycerides: 104 mg/dL (ref <150)

## 2023-11-10 ENCOUNTER — Encounter: Payer: Self-pay | Admitting: Family Medicine

## 2023-11-10 ENCOUNTER — Other Ambulatory Visit: Payer: Self-pay | Admitting: Family Medicine

## 2023-11-10 DIAGNOSIS — I69354 Hemiplegia and hemiparesis following cerebral infarction affecting left non-dominant side: Secondary | ICD-10-CM

## 2023-11-10 DIAGNOSIS — R7303 Prediabetes: Secondary | ICD-10-CM | POA: Insufficient documentation

## 2023-11-10 DIAGNOSIS — I6521 Occlusion and stenosis of right carotid artery: Secondary | ICD-10-CM

## 2023-11-10 DIAGNOSIS — Z5181 Encounter for therapeutic drug level monitoring: Secondary | ICD-10-CM

## 2023-11-10 DIAGNOSIS — D689 Coagulation defect, unspecified: Secondary | ICD-10-CM

## 2023-11-10 DIAGNOSIS — Z09 Encounter for follow-up examination after completed treatment for conditions other than malignant neoplasm: Secondary | ICD-10-CM

## 2023-11-10 MED ORDER — ATORVASTATIN CALCIUM 10 MG PO TABS: 10.0000 mg | ORAL_TABLET | Freq: Every day | ORAL | 3 refills | Status: DC

## 2023-11-10 MED ORDER — CLOPIDOGREL BISULFATE 75 MG PO TABS: 75.0000 mg | ORAL_TABLET | Freq: Every day | ORAL | 1 refills | Status: DC

## 2023-11-24 ENCOUNTER — Other Ambulatory Visit: Payer: Self-pay

## 2023-11-24 ENCOUNTER — Other Ambulatory Visit: Payer: Self-pay | Admitting: Family Medicine

## 2023-11-24 DIAGNOSIS — D689 Coagulation defect, unspecified: Secondary | ICD-10-CM

## 2023-11-24 DIAGNOSIS — I69354 Hemiplegia and hemiparesis following cerebral infarction affecting left non-dominant side: Secondary | ICD-10-CM

## 2023-11-24 DIAGNOSIS — Z5181 Encounter for therapeutic drug level monitoring: Secondary | ICD-10-CM

## 2023-11-24 DIAGNOSIS — Z09 Encounter for follow-up examination after completed treatment for conditions other than malignant neoplasm: Secondary | ICD-10-CM

## 2023-11-24 MED ORDER — CLOPIDOGREL BISULFATE 75 MG PO TABS: 75.0000 mg | ORAL_TABLET | Freq: Every day | ORAL | 1 refills | Status: DC

## 2023-11-24 NOTE — Telephone Encounter (Signed)
 Patient called and advised clopidogrel was sent to [facility] on 11/10/23 for 3 mo supply w/1 refill. She says she will call [facility].  Copied from CRM [id number]. Topic: Clinical - Prescription Issue >> Nov 24, 2023  9:44 AM Marland Kitchen D wrote: Patient needs a refill for he blood thinner medicine she doesn't know the name of it  [facility] [address] [place] [place] - [address] [place] [address] Phone: [phone] Fax: [phone] Hours: Not open 24 hours

## 2023-11-26 ENCOUNTER — Other Ambulatory Visit: Payer: Self-pay | Admitting: Family Medicine

## 2023-11-26 DIAGNOSIS — Z5181 Encounter for therapeutic drug level monitoring: Secondary | ICD-10-CM

## 2023-11-26 DIAGNOSIS — D689 Coagulation defect, unspecified: Secondary | ICD-10-CM

## 2023-11-26 DIAGNOSIS — Z09 Encounter for follow-up examination after completed treatment for conditions other than malignant neoplasm: Secondary | ICD-10-CM

## 2023-11-26 DIAGNOSIS — I69354 Hemiplegia and hemiparesis following cerebral infarction affecting left non-dominant side: Secondary | ICD-10-CM

## 2023-11-26 MED ORDER — CLOPIDOGREL BISULFATE 75 MG PO TABS: 75.0000 mg | ORAL_TABLET | Freq: Every day | ORAL | 1 refills | Status: DC

## 2023-11-26 NOTE — Telephone Encounter (Signed)
 Copied from CRM [id number]. Topic: Clinical - Prescription Issue >> Nov 26, 2023 10:49 AM Mary [name] wrote: Reason for CRM: clopidogrel (PLAVIX) 75 MG tablet [[id number]]  Patient called pharmacy and they told her they don't have her prescription. The order was put in on the 26th please follow up with patient.

## 2023-12-16 ENCOUNTER — Other Ambulatory Visit: Payer: Self-pay | Admitting: Family Medicine

## 2023-12-16 DIAGNOSIS — I6521 Occlusion and stenosis of right carotid artery: Secondary | ICD-10-CM

## 2023-12-16 DIAGNOSIS — I69354 Hemiplegia and hemiparesis following cerebral infarction affecting left non-dominant side: Secondary | ICD-10-CM

## 2023-12-17 NOTE — Telephone Encounter (Signed)
 Too soon for refill.  Requested Prescriptions  Pending Prescriptions Disp Refills   atorvastatin  (LIPITOR) 10 MG tablet [Pharmacy Med Name: Atorvastatin  Calcium  Oral Tablet 10 MG] 90 tablet 3    Sig: TAKE 1 TABLET AT BEDTIME     Cardiovascular:  Antilipid - Statins Failed - 12/17/2023 10:57 AM      Failed - Valid encounter within last 12 months    Recent Outpatient Visits           1 month ago Flu-like symptoms   [place] [facility] [place], Michelene, PA-C              Failed - Lipid Panel in normal range within the last 12 months    Cholesterol, Total  Date Value Ref Range Status  10/17/2015 145 100 - 199 mg/dL Final   Cholesterol  Date Value Ref Range Status  11/08/2023 122 <200 mg/dL Final   LDL Cholesterol (Calc)  Date Value Ref Range Status  11/08/2023 53 mg/dL (calc) Final    Comment:    Reference range: <100 . Desirable range <100 mg/dL for primary prevention;   <70 mg/dL for patients with CHD or diabetic patients  with > or = 2 CHD risk factors. SABRA LDL-C is now calculated using the Martin-Hopkins  calculation, which is a validated novel method providing  better accuracy than the Friedewald equation in the  estimation of LDL-C.  Gladis APPLETHWAITE et al. SANDREA. 7986;689(80): 2061-2068  (http://education.[facility].com/faq/FAQ164)    HDL  Date Value Ref Range Status  11/08/2023 50 > OR = 50 mg/dL Final  97/83/7982 43 >60 mg/dL Final   Triglycerides  Date Value Ref Range Status  11/08/2023 104 <150 mg/dL Final         Passed - Patient is not pregnant

## 2023-12-24 ENCOUNTER — Other Ambulatory Visit: Payer: Self-pay | Admitting: Family Medicine

## 2023-12-24 DIAGNOSIS — I10 Essential (primary) hypertension: Secondary | ICD-10-CM

## 2023-12-24 DIAGNOSIS — I69354 Hemiplegia and hemiparesis following cerebral infarction affecting left non-dominant side: Secondary | ICD-10-CM

## 2023-12-24 DIAGNOSIS — I6521 Occlusion and stenosis of right carotid artery: Secondary | ICD-10-CM

## 2023-12-24 NOTE — Telephone Encounter (Signed)
 Requested Prescriptions  Pending Prescriptions Disp Refills   amLODipine  (NORVASC ) 5 MG tablet [Pharmacy Med Name: amLODIPine  Besylate Oral Tablet 5 MG] 90 tablet 0    Sig: TAKE 1 TABLET EVERY DAY     Cardiovascular: Calcium  Channel Blockers 2 Failed - 12/24/2023  4:09 PM      Failed - Valid encounter within last 6 months    Recent Outpatient Visits           1 month ago Flu-like symptoms   [place] [facility] Adeline Hone, PA-C              Passed - Last BP in normal range    BP Readings from Last 1 Encounters:  11/08/23 116/68         Passed - Last Heart Rate in normal range    Pulse Readings from Last 1 Encounters:  11/08/23 91         Refused Prescriptions Disp Refills   atorvastatin  (LIPITOR) 10 MG tablet [Pharmacy Med Name: Atorvastatin  Calcium  Oral Tablet 10 MG] 90 tablet 3    Sig: TAKE 1 TABLET AT BEDTIME     Cardiovascular:  Antilipid - Statins Failed - 12/24/2023  4:09 PM      Failed - Valid encounter within last 12 months    Recent Outpatient Visits           1 month ago Flu-like symptoms   [place] [facility] [place], Jennie Moeller, PA-C              Failed - Lipid Panel in normal range within the last 12 months    Cholesterol, Total  Date Value Ref Range Status  10/17/2015 145 100 - 199 mg/dL Final   Cholesterol  Date Value Ref Range Status  11/08/2023 122 <200 mg/dL Final   LDL Cholesterol (Calc)  Date Value Ref Range Status  11/08/2023 53 mg/dL (calc) Final    Comment:    Reference range: <100 . Desirable range <100 mg/dL for primary prevention;   <70 mg/dL for patients with CHD or diabetic patients  with > or = 2 CHD risk factors. Aaron Aas LDL-C is now calculated using the Martin-Hopkins  calculation, which is a validated novel method providing  better accuracy than the Friedewald equation in the  estimation of LDL-C.  Melinda Sprawls et al. Erroll Heard. 4098;119(14): 2061-2068   (http://education.[facility].com/faq/FAQ164)    HDL  Date Value Ref Range Status  11/08/2023 50 > OR = 50 mg/dL Final  78/29/5621 43 >30 mg/dL Final   Triglycerides  Date Value Ref Range Status  11/08/2023 104 <150 mg/dL Final         Passed - Patient is not pregnant

## 2023-12-27 ENCOUNTER — Other Ambulatory Visit: Payer: Self-pay | Admitting: Family Medicine

## 2023-12-27 DIAGNOSIS — I6521 Occlusion and stenosis of right carotid artery: Secondary | ICD-10-CM

## 2023-12-27 DIAGNOSIS — I69354 Hemiplegia and hemiparesis following cerebral infarction affecting left non-dominant side: Secondary | ICD-10-CM

## 2023-12-29 NOTE — Telephone Encounter (Signed)
 Requested Prescriptions  Pending Prescriptions Disp Refills   atorvastatin  (LIPITOR) 10 MG tablet [Pharmacy Med Name: Atorvastatin  Calcium  Oral Tablet 10 MG] 90 tablet 3    Sig: TAKE 1 TABLET AT BEDTIME     Cardiovascular:  Antilipid - Statins Failed - 12/29/2023  9:31 AM      Failed - Valid encounter within last 12 months    Recent Outpatient Visits           1 month ago Flu-like symptoms    [facility] [place], Jennie Moeller, PA-C              Failed - Lipid Panel in normal range within the last 12 months    Cholesterol, Total  Date Value Ref Range Status  10/17/2015 145 100 - 199 mg/dL Final   Cholesterol  Date Value Ref Range Status  11/08/2023 122 <200 mg/dL Final   LDL Cholesterol (Calc)  Date Value Ref Range Status  11/08/2023 53 mg/dL (calc) Final    Comment:    Reference range: <100 . Desirable range <100 mg/dL for primary prevention;   <70 mg/dL for patients with CHD or diabetic patients  with > or = 2 CHD risk factors. Aaron Aas LDL-C is now calculated using the Martin-Hopkins  calculation, which is a validated novel method providing  better accuracy than the Friedewald equation in the  estimation of LDL-C.  Melinda Sprawls et al. Erroll Heard. 0454;098(11): 2061-2068  (http://education.[facility].com/faq/FAQ164)    HDL  Date Value Ref Range Status  11/08/2023 50 > OR = 50 mg/dL Final  91/47/8295 43 >62 mg/dL Final   Triglycerides  Date Value Ref Range Status  11/08/2023 104 <150 mg/dL Final         Passed - Patient is not pregnant

## 2023-12-30 ENCOUNTER — Telehealth: Payer: Self-pay

## 2023-12-30 NOTE — Telephone Encounter (Signed)
 Copied from CRM [id number]. Topic: Clinical - Medication Question >> Dec 30, 2023  9:12 AM Hassie Lint wrote: Reason for CRM: Patient states received a letter in the mail from her pharmacy stating the prescriber for her atorvastatin  (LIPITOR) 10 MG tablet declined the request to renew the prescription.   Pharmacy:  [facility] - [place], [place] - [address] [place] [address] Phone: [phone] Fax: [phone]  Patient can be reached at [phone]

## 2023-12-30 NOTE — Telephone Encounter (Signed)
 Rx Atorvastatin  10mg  sent yesterday to [facility]

## 2024-01-14 ENCOUNTER — Other Ambulatory Visit (INDEPENDENT_AMBULATORY_CARE_PROVIDER_SITE_OTHER): Payer: Self-pay | Admitting: Vascular Surgery

## 2024-01-14 DIAGNOSIS — I739 Peripheral vascular disease, unspecified: Secondary | ICD-10-CM

## 2024-01-17 ENCOUNTER — Encounter (INDEPENDENT_AMBULATORY_CARE_PROVIDER_SITE_OTHER): Payer: Self-pay | Admitting: Vascular Surgery

## 2024-01-17 ENCOUNTER — Ambulatory Visit (INDEPENDENT_AMBULATORY_CARE_PROVIDER_SITE_OTHER): Payer: Medicare HMO | Admitting: Vascular Surgery

## 2024-01-17 ENCOUNTER — Ambulatory Visit (INDEPENDENT_AMBULATORY_CARE_PROVIDER_SITE_OTHER): Payer: Medicare HMO

## 2024-01-17 VITALS — BP 149/88 | HR 81 | Resp 18 | Ht 66.0 in | Wt 108.6 lb

## 2024-01-17 DIAGNOSIS — I6523 Occlusion and stenosis of bilateral carotid arteries: Secondary | ICD-10-CM | POA: Diagnosis not present

## 2024-01-17 DIAGNOSIS — E782 Mixed hyperlipidemia: Secondary | ICD-10-CM

## 2024-01-17 DIAGNOSIS — I70213 Atherosclerosis of native arteries of extremities with intermittent claudication, bilateral legs: Secondary | ICD-10-CM | POA: Diagnosis not present

## 2024-01-17 DIAGNOSIS — I739 Peripheral vascular disease, unspecified: Secondary | ICD-10-CM

## 2024-01-17 DIAGNOSIS — I2510 Atherosclerotic heart disease of native coronary artery without angina pectoris: Secondary | ICD-10-CM | POA: Diagnosis not present

## 2024-01-17 DIAGNOSIS — I10 Essential (primary) hypertension: Secondary | ICD-10-CM | POA: Diagnosis not present

## 2024-01-17 LAB — VAS US ABI WITH/WO TBI
Left ABI: 0.7
Right ABI: 0.69

## 2024-01-17 NOTE — Progress Notes (Signed)
 MRN : [id number]  Mary [name] is a 73 y.o. (1951-08-27) female who presents with chief complaint of check circulation.  History of Present Illness:   The patient returns to the office for followup regarding atherosclerotic changes of the lower extremities and review of the noninvasive studies.    There have been no interval changes in lower extremity symptoms. No interval shortening of the patient's claudication distance or development of rest pain symptoms. No new ulcers or wounds have occurred since the last visit.   There have been no significant changes to the patient's overall health care.   The patient denies amaurosis fugax or recent TIA symptoms. There are no documented recent neurological changes noted. There is no history of DVT, PE or superficial thrombophlebitis. The patient denies recent episodes of angina or shortness of breath.    ABI Rt=0.69 and Lt=0.70 (previous ABI's Rt=0.63 and Lt=0.55)  Current Meds  Medication Sig   acetaminophen  (TYLENOL ) 325 MG tablet Take 2 tablets (650 mg total) by mouth every 4 (four) hours as needed for mild pain (or temp > 37.5 C (99.5 F)).   amLODipine  (NORVASC ) 5 MG tablet TAKE 1 TABLET EVERY DAY   aspirin  81 MG chewable tablet Chew 1 tablet (81 mg total) by mouth daily.   atorvastatin  (LIPITOR) 10 MG tablet TAKE 1 TABLET AT BEDTIME   benzonatate  (TESSALON ) 100 MG capsule Take 1 capsule (100 mg total) by mouth 3 (three) times daily as needed for cough.   Blood Glucose Monitoring Suppl DEVI 1 each by Does not apply route daily as needed. May substitute to any manufacturer covered by patient's insurance.   cholecalciferol  (VITAMIN D3) 25 MCG (1000 UNIT) tablet Take 2 tablets (2,000 Units total) by mouth daily.   clopidogrel  (PLAVIX ) 75 MG tablet Take 1 tablet (75 mg total) by mouth daily.   dextromethorphan-guaiFENesin  (MUCINEX  DM) 30-600 MG 12hr tablet Take 1 tablet by  mouth 2 (two) times daily.   nicotine  (NICODERM CQ  - DOSED IN MG/24 HOURS) 14 mg/24hr patch Place 1 patch (14 mg total) onto the skin daily.   nicotine  (NICODERM CQ  - DOSED IN MG/24 HR) 7 mg/24hr patch Place 1 patch (7 mg total) onto the skin daily.   varenicline  (CHANTIX  CONTINUING MONTH PAK) 1 MG tablet Take 1 tablet (1 mg total) by mouth 2 (two) times daily.   Varenicline  Tartrate, Starter, (CHANTIX  STARTING MONTH PAK) 0.5 MG X 11 & 1 MG X 42 TBPK Take 0.5 mg tablet by mouth 1x daily for 3 days, then increase to 0.5 mg tablet 2x daily for 4 days, then increase to 1 mg tablet 2x daily.    Past Medical History:  Diagnosis Date   Anemia    Arthritis    Coronary artery disease    Hyperlipidemia    Hypertension    Hyperuricemia    Osteoporosis    PAD (peripheral artery disease) (HCC) 02/2010   angiplasty, popliteal artery   Pre-diabetes    Stroke [employer])    Tobacco abuse     Past Surgical History:  Procedure Laterality Date   ANGIOPLASTY  July  2011   PAD- popliteal artery   COLONOSCOPY WITH PROPOFOL  N/A 01/13/2018   Procedure: COLONOSCOPY WITH PROPOFOL ;  Surgeon: Selena Daily, MD;  Location: [facility] ENDOSCOPY;  Service: Gastroenterology;  Laterality: N/A;   COLONOSCOPY WITH PROPOFOL  N/A 01/14/2018   Procedure: COLONOSCOPY WITH PROPOFOL ;  Surgeon: Irby Mannan, MD;  Location: [facility] ENDOSCOPY;  Service: Endoscopy;  Laterality: N/A;   COLONOSCOPY WITH PROPOFOL  N/A 07/06/2018   Procedure: COLONOSCOPY WITH PROPOFOL ;  Surgeon: Irby Mannan, MD;  Location: [facility] ENDOSCOPY;  Service: Endoscopy;  Laterality: N/A;   CORONARY ANGIOPLASTY     IR ANGIO INTRA EXTRACRAN SEL COM CAROTID INNOMINATE UNI L MOD SED  03/01/2023   IR ANGIO INTRA EXTRACRAN SEL INTERNAL CAROTID UNI R MOD SED  03/01/2023   IR CT HEAD LTD  01/27/2023   IR CT HEAD LTD  03/01/2023   IR INTRAVSC STENT CERV CAROTID W/EMB-PROT MOD SED INCL ANGIO  03/01/2023   IR PERCUTANEOUS ART THROMBECTOMY/INFUSION INTRACRANIAL INC DIAG  ANGIO  01/27/2023   IR RADIOLOGIST EVAL & MGMT  04/07/2023   JOINT REPLACEMENT     Leg Bypass Surgery Right 08/2010   OVARIAN CYST SURGERY  1980s   POPLITEAL ARTERY STENT Right 05/2010   POPLITEAL ARTERY STENT Right 07/2010   RADIOLOGY WITH ANESTHESIA N/A 01/27/2023   Procedure: IR WITH ANESTHESIA;  Surgeon: Radiologist, Medication, MD;  Location: [facility] OR;  Service: Radiology;  Laterality: N/A;   RADIOLOGY WITH ANESTHESIA Right 03/01/2023   Procedure: Right carotid stent;  Surgeon: Luellen Sages, MD;  Location: [facility] OR;  Service: Radiology;  Laterality: Right;   TOTAL HIP ARTHROPLASTY Left 10/27/2017   Procedure: TOTAL HIP ARTHROPLASTY;  Surgeon: Marlynn Singer, MD;  Location: [facility] ORS;  Service: Orthopedics;  Laterality: Left;    Social History Social History   Tobacco Use   Smoking status: Every Day    Current packs/day: 0.50    Average packs/day: 0.5 packs/day for 40.0 years (20.0 ttl pk-yrs)    Types: Cigarettes   Smokeless tobacco: Never   Tobacco comments:    Down to less than 1/2 pack per day as of 02/25/23  Vaping Use   Vaping status: Never Used  Substance Use Topics   Alcohol use: No   Drug use: No    Family History Family History  Problem Relation Age of Onset   Arthritis Mother    Diabetes Mother    Stroke Mother    Hypertension Mother    Dementia Mother    Cancer Father    Arthritis Sister    Seizures Sister    Diabetes Sister    Hypertension Sister    COPD Sister    Cancer Brother    Stroke Maternal Grandmother    Diabetes Paternal Grandmother    Cancer Sister    Diabetes Sister    Arthritis Sister    Diabetes Brother    Stroke Brother    Healthy Brother    Diabetes Brother    Stroke Brother    Cancer Brother    Heart disease Neg Hx    Breast cancer Neg Hx     No Known Allergies   REVIEW OF SYSTEMS (Negative unless checked)  Constitutional: [] Weight loss  [] Fever  [] Chills Cardiac: [] Chest pain   [] Chest pressure   [] Palpitations   [] Shortness  of breath when laying flat   [] Shortness of breath with exertion. Vascular:  [x] Pain in legs with walking   [] Pain in legs at rest  [] History of DVT   [] Phlebitis   []   Swelling in legs   [] Varicose veins   [] Non-healing ulcers Pulmonary:   [] Uses home oxygen   [] Productive cough   [] Hemoptysis   [] Wheeze  [] COPD   [] Asthma Neurologic:  [] Dizziness   [] Seizures   [] History of stroke   [] History of TIA  [] Aphasia   [] Vissual changes   [] Weakness or numbness in arm   [] Weakness or numbness in leg Musculoskeletal:   [] Joint swelling   [] Joint pain   [] Low back pain Hematologic:  [] Easy bruising  [] Easy bleeding   [] Hypercoagulable state   [] Anemic Gastrointestinal:  [] Diarrhea   [] Vomiting  [] Gastroesophageal reflux/heartburn   [] Difficulty swallowing. Genitourinary:  [] Chronic kidney disease   [] Difficult urination  [] Frequent urination   [] Blood in urine Skin:  [] Rashes   [] Ulcers  Psychological:  [] History of anxiety   []  History of major depression.  Physical Examination  Vitals:   01/17/24 1318  BP: (!) 149/88  Pulse: 81  Resp: 18  Weight: 108 lb 9.6 oz (49.3 kg)  Height: 5\' 6"  (1.676 m)   Body mass index is 17.53 kg/m. Gen: WD/WN, NAD Head: [place]/AT, No temporalis wasting.  Ear/Nose/Throat: Hearing grossly intact, nares w/o erythema or drainage Eyes: PER, EOMI, sclera nonicteric.  Neck: Supple, no masses.  No bruit or JVD.  Pulmonary:  Good air movement, no audible wheezing, no use of accessory muscles.  Cardiac: RRR, normal S1, S2, no Murmurs. Vascular:  mild trophic changes, no open wounds Vessel Right Left  Radial Palpable Palpable  PT Not Palpable Not Palpable  DP Not Palpable Not Palpable  Gastrointestinal: soft, non-distended. No guarding/no peritoneal signs.  Musculoskeletal: M/S 5/5 throughout.  No visible deformity.  Neurologic: CN 2-12 intact. Pain and light touch intact in extremities.  Symmetrical.  Speech is fluent. Motor exam as listed above. Psychiatric: Judgment  intact, Mood & affect appropriate for pt's clinical situation. Dermatologic: No rashes or ulcers noted.  No changes consistent with cellulitis.   CBC Lab Results  Component Value Date   WBC 6.3 11/08/2023   HGB 13.3 11/08/2023   HCT 40.6 11/08/2023   MCV 89.0 11/08/2023   PLT 260 11/08/2023    BMET    Component Value Date/Time   NA 140 11/08/2023 1131   NA 142 10/17/2015 1017   NA 138 01/21/2012 0740   K 3.6 11/08/2023 1131   K 2.8 (L) 01/21/2012 0740   CL 102 11/08/2023 1131   CL 98 01/21/2012 0740   CO2 28 11/08/2023 1131   CO2 30 01/21/2012 0740   GLUCOSE 87 11/08/2023 1131   GLUCOSE 117 (H) 01/21/2012 0740   BUN 11 11/08/2023 1131   BUN 6 (L) 10/17/2015 1017   BUN 9 01/21/2012 0740   CREATININE 0.90 11/08/2023 1131   CALCIUM  9.6 11/08/2023 1131   CALCIUM  9.3 01/21/2012 0740   GFRNONAA >60 03/02/2023 0500   GFRNONAA 77 01/09/2021 1133   GFRAA 90 01/09/2021 1133   CrCl cannot be calculated (Patient's most recent lab result is older than the maximum 21 days allowed.).  COAG Lab Results  Component Value Date   INR 1.1 (A) 06/17/2023   INR 13.1 06/17/2023   INR 1.0 03/01/2023    Radiology No results found.   Assessment/Plan 1. Atherosclerosis of native artery of both lower extremities with intermittent claudication (HCC) (Primary)  Recommend:  The patient has evidence of atherosclerosis of the lower extremities with claudication.  The patient does not voice lifestyle limiting changes at this point in time.  Noninvasive studies do not  suggest clinically significant change.  No invasive studies, angiography or surgery at this time The patient should continue walking and begin a more formal exercise program.  The patient should continue antiplatelet therapy and aggressive treatment of the lipid abnormalities  No changes in the patient's medications at this time  Continued surveillance is indicated as atherosclerosis is likely to progress with time.     The patient will continue follow up with noninvasive studies as ordered.  - VAS US  ABI WITH/WO TBI; Future  2. Bilateral carotid artery stenosis Recommend:   Given the patient's asymptomatic subcritical stenosis no further invasive testing or surgery at this time.   Continue antiplatelet therapy as prescribed Continue management of CAD, HTN and Hyperlipidemia Healthy heart diet,  encouraged exercise at least 4 times per week   Follow up in 6 months with duplex ultrasound and physical exam   3. Essential hypertension Continue antihypertensive medications as already ordered, these medications have been reviewed and there are no changes at this time.  4. Coronary artery disease involving native coronary artery of native heart, unspecified whether angina present Continue cardiac and antihypertensive medications as already ordered and reviewed, no changes at this time.  Continue statin as ordered and reviewed, no changes at this time  Nitrates PRN for chest pain  5. Mixed hyperlipidemia Continue statin as ordered and reviewed, no changes at this time    Devon Fogo, MD  01/17/2024 1:22 PM

## 2024-02-26 ENCOUNTER — Encounter (HOSPITAL_COMMUNITY): Payer: Self-pay | Admitting: Interventional Radiology

## 2024-03-15 ENCOUNTER — Other Ambulatory Visit: Payer: Self-pay | Admitting: Family Medicine

## 2024-03-15 DIAGNOSIS — I10 Essential (primary) hypertension: Secondary | ICD-10-CM

## 2024-03-16 NOTE — Telephone Encounter (Signed)
 Requested Prescriptions  Pending Prescriptions Disp Refills   amLODipine  (NORVASC ) 5 MG tablet [Pharmacy Med Name: AMLODIPINE  BESYLATE 5 MG Oral Tablet] 90 tablet 0    Sig: TAKE 1 TABLET EVERY DAY     Cardiovascular: Calcium  Channel Blockers 2 Failed - 03/16/2024  4:37 PM      Failed - Last BP in normal range    BP Readings from Last 1 Encounters:  01/17/24 (!) 149/88         Failed - Valid encounter within last 6 months    Recent Outpatient Visits           4 months ago Flu-like symptoms   [facility] [place], Michelene, PA-C              Passed - Last Heart Rate in normal range    Pulse Readings from Last 1 Encounters:  01/17/24 81

## 2024-04-15 ENCOUNTER — Other Ambulatory Visit: Payer: Self-pay | Admitting: Family Medicine

## 2024-04-15 DIAGNOSIS — I69354 Hemiplegia and hemiparesis following cerebral infarction affecting left non-dominant side: Secondary | ICD-10-CM

## 2024-04-15 DIAGNOSIS — Z5181 Encounter for therapeutic drug level monitoring: Secondary | ICD-10-CM

## 2024-04-15 DIAGNOSIS — D689 Coagulation defect, unspecified: Secondary | ICD-10-CM

## 2024-04-15 DIAGNOSIS — Z09 Encounter for follow-up examination after completed treatment for conditions other than malignant neoplasm: Secondary | ICD-10-CM

## 2024-04-18 ENCOUNTER — Other Ambulatory Visit: Payer: Self-pay | Admitting: Family Medicine

## 2024-04-18 DIAGNOSIS — Z09 Encounter for follow-up examination after completed treatment for conditions other than malignant neoplasm: Secondary | ICD-10-CM

## 2024-04-18 DIAGNOSIS — D689 Coagulation defect, unspecified: Secondary | ICD-10-CM

## 2024-04-18 DIAGNOSIS — Z5181 Encounter for therapeutic drug level monitoring: Secondary | ICD-10-CM

## 2024-04-18 DIAGNOSIS — I69354 Hemiplegia and hemiparesis following cerebral infarction affecting left non-dominant side: Secondary | ICD-10-CM

## 2024-04-18 NOTE — Telephone Encounter (Unsigned)
 Copied from CRM [id number]. Topic: Clinical - Medication Refill >> Apr 18, 2024 11:46 AM Myrick T wrote: Medication: clopidogrel  (PLAVIX ) 75 MG tablet  Has the patient contacted their pharmacy? Yes  This is the patient's preferred pharmacy:  [facility] - [place], [place] - [address] [place] [address] Phone: [phone] Fax: [phone]  Is this the correct pharmacy for this prescription? Yes  Has the prescription been filled recently? Yes  Is the patient out of the medication? No  Has the patient been seen for an appointment in the last year OR does the patient have an upcoming appointment? Yes  Can we respond through MyChart? No  Agent: Please be advised that Rx refills may take up to 3 business days. We ask that you follow-up with your pharmacy.

## 2024-04-18 NOTE — Telephone Encounter (Signed)
 Requested by interface surescripts. Future visit 06/19/24.  Requested Prescriptions  Pending Prescriptions Disp Refills   clopidogrel  (PLAVIX ) 75 MG tablet [Pharmacy Med Name: CLOPIDOGREL  75 MG Oral Tablet] 90 tablet 0    Sig: TAKE 1 TABLET EVERY DAY     Hematology: Antiplatelets - clopidogrel  Failed - 04/18/2024  1:07 PM      Failed - Valid encounter within last 6 months    Recent Outpatient Visits           5 months ago Flu-like symptoms   [place] [facility] [place], Michelene, PA-C              Passed - HCT in normal range and within 180 days    HCT  Date Value Ref Range Status  11/08/2023 40.6 35.0 - 45.0 % Final   Hematocrit  Date Value Ref Range Status  10/17/2015 39.3 34.0 - 46.6 % Final         Passed - HGB in normal range and within 180 days    Hemoglobin  Date Value Ref Range Status  11/08/2023 13.3 11.7 - 15.5 g/dL Final  97/83/7982 86.1 11.1 - 15.9 g/dL Final         Passed - PLT in normal range and within 180 days    Platelets  Date Value Ref Range Status  11/08/2023 260 140 - 400 Thousand/uL Final  10/17/2015 315 150 - 379 x10E3/uL Final         Passed - Cr in normal range and within 360 days    Creat  Date Value Ref Range Status  11/08/2023 0.90 0.60 - 1.00 mg/dL Final

## 2024-04-18 NOTE — Telephone Encounter (Signed)
 Called patient to schedule appt for medication refills. Scheduled patient appt for annual physical 06/19/24. Patient reports she has 42 tablets left on last Rx. Will refill Rx

## 2024-04-20 NOTE — Telephone Encounter (Signed)
 Reordered 04/18/24  #90 tablets

## 2024-04-27 ENCOUNTER — Ambulatory Visit: Payer: Medicare HMO

## 2024-04-27 DIAGNOSIS — Z0000 Encounter for general adult medical examination without abnormal findings: Secondary | ICD-10-CM | POA: Diagnosis not present

## 2024-04-27 DIAGNOSIS — Z1211 Encounter for screening for malignant neoplasm of colon: Secondary | ICD-10-CM

## 2024-04-27 NOTE — Progress Notes (Signed)
 Subjective:   Mary [name] is a 73 y.o. who presents for a Medicare Wellness preventive visit.  As a reminder, Annual Wellness Visits don't include a physical exam, and some assessments may be limited, especially if this visit is performed virtually. We may recommend an in-person follow-up visit with your provider if needed.  Visit Complete: Virtual I connected with  Mary [name] on 04/27/24 by a audio enabled telemedicine application and verified that I am speaking with the correct person using two identifiers.  Patient Location: Home  Provider Location: Home Office  I discussed the limitations of evaluation and management by telemedicine. The patient expressed understanding and agreed to proceed.  Vital Signs: Because this visit was a virtual/telehealth visit, some criteria may be missing or patient reported. Any vitals not documented were not able to be obtained and vitals that have been documented are patient reported.  VideoDeclined- This patient declined Librarian, academic. Therefore the visit was completed with audio only.  Persons Participating in Visit: Patient.  AWV Questionnaire: No: Patient Medicare AWV questionnaire was not completed prior to this visit.  Cardiac Risk Factors include: advanced age (>39men, >72 women);hypertension;dyslipidemia;smoking/ tobacco exposure     Objective:    Today's Vitals   04/27/24 1129  PainSc: 3    There is no height or weight on file to calculate BMI.     04/27/2024   11:35 AM 04/23/2023   12:02 PM 03/01/2023    6:00 PM 02/02/2023   12:40 PM 01/29/2023    9:27 PM 09/29/2021    2:22 PM 02/20/2021   10:05 AM  Advanced Directives  Does Patient Have a Medical Advance Directive? No No No No No No No  Would patient like information on creating a medical advance directive? No - Patient declined  No - Patient declined No - Patient declined No - Patient declined  No - Patient declined    Current Medications  (verified) Outpatient Encounter Medications as of 04/27/2024  Medication Sig   acetaminophen  (TYLENOL ) 325 MG tablet Take 2 tablets (650 mg total) by mouth every 4 (four) hours as needed for mild pain (or temp > 37.5 C (99.5 F)).   amLODipine  (NORVASC ) 5 MG tablet TAKE 1 TABLET EVERY DAY   aspirin  81 MG chewable tablet Chew 1 tablet (81 mg total) by mouth daily.   atorvastatin  (LIPITOR) 10 MG tablet TAKE 1 TABLET AT BEDTIME   Blood Glucose Monitoring Suppl DEVI 1 each by Does not apply route daily as needed. May substitute to any manufacturer covered by patient's insurance.   cholecalciferol  (VITAMIN D3) 25 MCG (1000 UNIT) tablet Take 2 tablets (2,000 Units total) by mouth daily.   clopidogrel  (PLAVIX ) 75 MG tablet TAKE 1 TABLET EVERY DAY   benzonatate  (TESSALON ) 100 MG capsule Take 1 capsule (100 mg total) by mouth 3 (three) times daily as needed for cough. (Patient not taking: Reported on 04/27/2024)   dextromethorphan-guaiFENesin  (MUCINEX  DM) 30-600 MG 12hr tablet Take 1 tablet by mouth 2 (two) times daily. (Patient not taking: Reported on 04/27/2024)   nicotine  (NICODERM CQ  - DOSED IN MG/24 HOURS) 14 mg/24hr patch Place 1 patch (14 mg total) onto the skin daily. (Patient not taking: Reported on 04/27/2024)   nicotine  (NICODERM CQ  - DOSED IN MG/24 HR) 7 mg/24hr patch Place 1 patch (7 mg total) onto the skin daily. (Patient not taking: Reported on 04/27/2024)   varenicline  (CHANTIX  CONTINUING MONTH PAK) 1 MG tablet Take 1 tablet (1 mg total) by  mouth 2 (two) times daily. (Patient not taking: Reported on 04/27/2024)   Varenicline  Tartrate, Starter, (CHANTIX  STARTING MONTH PAK) 0.5 MG X 11 & 1 MG X 42 TBPK Take 0.5 mg tablet by mouth 1x daily for 3 days, then increase to 0.5 mg tablet 2x daily for 4 days, then increase to 1 mg tablet 2x daily. (Patient not taking: Reported on 04/27/2024)   No facility-administered encounter medications on file as of 04/27/2024.    Allergies (verified) Patient has no  known allergies.   History: Past Medical History:  Diagnosis Date   Anemia    Arthritis    Coronary artery disease    Hyperlipidemia    Hypertension    Hyperuricemia    Osteoporosis    PAD (peripheral artery disease) (HCC) 02/2010   angiplasty, popliteal artery   Pre-diabetes    Stroke [facility])    Tobacco abuse    Past Surgical History:  Procedure Laterality Date   ANGIOPLASTY  July 2011   PAD- popliteal artery   COLONOSCOPY WITH PROPOFOL  N/A 01/13/2018   Procedure: COLONOSCOPY WITH PROPOFOL ;  Surgeon: Unk Corinn Skiff, MD;  Location: [facility] ENDOSCOPY;  Service: Gastroenterology;  Laterality: N/A;   COLONOSCOPY WITH PROPOFOL  N/A 01/14/2018   Procedure: COLONOSCOPY WITH PROPOFOL ;  Surgeon: Janalyn Keene NOVAK, MD;  Location: [facility] ENDOSCOPY;  Service: Endoscopy;  Laterality: N/A;   COLONOSCOPY WITH PROPOFOL  N/A 07/06/2018   Procedure: COLONOSCOPY WITH PROPOFOL ;  Surgeon: Janalyn Keene NOVAK, MD;  Location: [facility] ENDOSCOPY;  Service: Endoscopy;  Laterality: N/A;   CORONARY ANGIOPLASTY     IR ANGIO INTRA EXTRACRAN SEL COM CAROTID INNOMINATE UNI L MOD SED  03/01/2023   IR ANGIO INTRA EXTRACRAN SEL INTERNAL CAROTID UNI R MOD SED  03/01/2023   IR CT HEAD LTD  01/27/2023   IR CT HEAD LTD  03/01/2023   IR INTRAVSC STENT CERV CAROTID W/EMB-PROT MOD SED INCL ANGIO  03/01/2023   IR PERCUTANEOUS ART THROMBECTOMY/INFUSION INTRACRANIAL INC DIAG ANGIO  01/27/2023   IR RADIOLOGIST EVAL & MGMT  04/07/2023   JOINT REPLACEMENT     Leg Bypass Surgery Right 08/2010   OVARIAN CYST SURGERY  1980s   POPLITEAL ARTERY STENT Right 05/2010   POPLITEAL ARTERY STENT Right 07/2010   RADIOLOGY WITH ANESTHESIA N/A 01/27/2023   Procedure: IR WITH ANESTHESIA;  Surgeon: Radiologist, Medication, MD;  Location: [facility] OR;  Service: Radiology;  Laterality: N/A;   RADIOLOGY WITH ANESTHESIA Right 03/01/2023   Procedure: Right carotid stent;  Surgeon: Dolphus Carrion, MD;  Location[facility] OR;  Service: Radiology;  Laterality: Right;    TOTAL HIP ARTHROPLASTY Left 10/27/2017   Procedure: TOTAL HIP ARTHROPLASTY;  Surgeon: Cleotilde Barrio, MD;  Location: [facility] ORS;  Service: Orthopedics;  Laterality: Left;   Family History  Problem Relation Age of Onset   Arthritis Mother    Diabetes Mother    Stroke Mother    Hypertension Mother    Dementia Mother    Cancer Father    Arthritis Sister    Seizures Sister    Diabetes Sister    Hypertension Sister    COPD Sister    Cancer Brother    Stroke Maternal Grandmother    Diabetes Paternal Grandmother    Cancer Sister    Diabetes Sister    Arthritis Sister    Diabetes Brother    Stroke Brother    Healthy Brother    Diabetes Brother    Stroke Brother    Cancer Brother    Heart disease Neg  Hx    Breast cancer Neg Hx    Social History   Socioeconomic History   Marital status: Married    Spouse name: Joien   Number of children: 0   Years of education: Not on file   Highest education level: 12th grade  Occupational History   Occupation: Disabled  Tobacco Use   Smoking status: Every Day    Current packs/day: 0.50    Average packs/day: 0.5 packs/day for 40.0 years (20.0 ttl pk-yrs)    Types: Cigarettes   Smokeless tobacco: Never   Tobacco comments:    Down to less than 1/2 pack per day as of 02/25/23  Vaping Use   Vaping status: Never Used  Substance and Sexual Activity   Alcohol use: No   Drug use: No   Sexual activity: Yes  Other Topics Concern   Not on file  Social History Narrative   Not on file   Social Drivers of Health   Financial Resource Strain: Low Risk  (04/27/2024)   Overall Financial Resource Strain (CARDIA)    Difficulty of Paying Living Expenses: Not hard at all  Food Insecurity: No Food Insecurity (04/27/2024)   Hunger Vital Sign    Worried About Running Out of Food in the Last Year: Never true    Ran Out of Food in the Last Year: Never true  Transportation Needs: No Transportation Needs (04/27/2024)   PRAPARE - Scientist, research (physical sciences) (Medical): No    Lack of Transportation (Non-Medical): No  Physical Activity: Insufficiently Active (04/27/2024)   Exercise Vital Sign    Days of Exercise per Week: 3 days    Minutes of Exercise per Session: 30 min  Stress: No Stress Concern Present (04/27/2024)   [employer] of Occupational Health - Occupational Stress Questionnaire    Feeling of Stress: Only a little  Social Connections: Moderately Isolated (04/27/2024)   Social Connection and Isolation Panel    Frequency of Communication with Friends and Family: More than three times a week    Frequency of Social Gatherings with Friends and Family: Twice a week    Attends Religious Services: Never    Database administrator or Organizations: No    Attends Engineer, structural: Never    Marital Status: Married    Tobacco Counseling Ready to quit: Not Answered Counseling given: Not Answered Tobacco comments: Down to less than 1/2 pack per day as of 02/25/23    Clinical Intake:  Pre-visit preparation completed: Yes  Pain : 0-10 Pain Score: 3  Pain Type: Chronic pain Pain Location: Leg Pain Orientation: Right Pain Descriptors / Indicators: Aching, Constant Pain Onset: More than a month ago Pain Frequency: Constant     BMI - recorded: 17.4 Nutritional Status: BMI <19  Underweight Nutritional Risks: None Diabetes: No  Lab Results  Component Value Date   HGBA1C 6.4 (H) 11/08/2023   HGBA1C 6.2 (H) 01/28/2023   HGBA1C 6.3 (H) 01/04/2023     How often do you need to have someone help you when you read instructions, pamphlets, or other written materials from your doctor or pharmacy?: 1 - Never  Interpreter Needed?: No  Information entered by :: JHONNIE DAS, LPN   Activities of Daily Living    04/27/2024   11:36 AM 06/17/2023   10:22 AM  In your present state of health, do you have any difficulty performing the following activities:  Hearing? 0 0  Vision? 0 0  Difficulty  concentrating  or making decisions? 0 0  Walking or climbing stairs? 0 0  Dressing or bathing? 0 0  Doing errands, shopping? 0 0  Preparing Food and eating ? N   Using the Toilet? N   In the past six months, have you accidently leaked urine? N   Do you have problems with loss of bowel control? N   Managing your Medications? N   Managing your Finances? N   Housekeeping or managing your Housekeeping? N     Patient Care Team: Leavy Mole, PA-C as PCP - General (Family Medicine) Marea Selinda RAMAN, MD as Referring Physician (Vascular Surgery) Pa, [facility] (Optometry)  I have updated your Care Teams any recent Medical Services you may have received from other providers in the past year.     Assessment:   This is a routine wellness examination for Mary [name].  Hearing/Vision screen Hearing Screening - Comments:: NO AIDS Vision Screening - Comments:: READERS- [facility]- LAST VISIT WAS LAST YEAR   Goals Addressed             This Visit's Progress    DIET - EAT MORE FRUITS AND VEGETABLES         Depression Screen     04/27/2024   11:34 AM 06/17/2023   10:14 AM 04/23/2023   11:47 AM 04/22/2023    8:54 AM 03/15/2023   10:33 AM 02/15/2023   10:49 AM 01/04/2023   11:01 AM  PHQ 2/9 Scores  PHQ - 2 Score 0 0 0 0 0 0 0  PHQ- 9 Score 0   0 0 0 0    Fall Risk     04/27/2024   11:36 AM 06/17/2023   10:14 AM 04/23/2023   11:38 AM 04/22/2023    8:54 AM 03/15/2023   10:33 AM  Fall Risk   Falls in the past year? 0 0 0 1 1  Number falls in past yr: 0 0 0 1 1  Injury with Fall? 0 0 0 1 1  Risk for fall due to : No Fall Risks No Fall Risks No Fall Risks Impaired balance/gait Impaired balance/gait  Follow up Falls evaluation completed;Falls prevention discussed Falls prevention discussed Education provided;Falls prevention discussed Falls prevention discussed;Education provided;Falls evaluation completed Falls prevention discussed;Education provided;Falls evaluation completed     MEDICARE RISK AT HOME:     TIMED UP AND GO:  Was the test performed?  No  Cognitive Function: 6CIT completed        04/27/2024   11:36 AM 04/23/2023   12:07 PM 02/20/2020   10:26 AM 02/16/2019    9:58 AM 02/10/2018   10:28 AM  6CIT Screen  What Year? 0 points 0 points 0 points 0 points 0 points  What month? 0 points 0 points 0 points 0 points 0 points  What time? 0 points 0 points 0 points 0 points 0 points  Count back from 20 0 points 0 points 0 points 0 points 0 points  Months in reverse 0 points 0 points 0 points 0 points 2 points  Repeat phrase 0 points 0 points 0 points 0 points 2 points  Total Score 0 points 0 points 0 points 0 points 4 points    Immunizations Immunization History  Administered Date(s) Administered   Fluad Trivalent(High Dose 65+) 06/17/2023   Moderna Sars-Covid-2 Vaccination 10/28/2019, 11/25/2019   PFIZER(Purple Top)SARS-COV-2 Vaccination 08/11/2021   PNEUMOCOCCAL CONJUGATE-20 10/06/2022   Tdap 09/27/2017    Screening Tests Health Maintenance  Topic Date  Due   DEXA SCAN  Never done   Zoster Vaccines- Shingrix (1 of 2) Never done   Lung Cancer Screening  Never done   COVID-19 Vaccine (4 - 2024-25 season) 05/02/2023   INFLUENZA VACCINE  03/31/2024   Colonoscopy  11/07/2024 (Originally 07/07/2023)   MAMMOGRAM  09/05/2024   Medicare Annual Wellness (AWV)  04/27/2025   DTaP/Tdap/Td (2 - Td or Tdap) 09/28/2027   Pneumococcal Vaccine: 50+ Years  Completed   Hepatitis C Screening  Completed   HPV VACCINES  Aged Out   Meningococcal B Vaccine  Aged Out    Health Maintenance  Health Maintenance Due  Topic Date Due   DEXA SCAN  Never done   Zoster Vaccines- Shingrix (1 of 2) Never done   Lung Cancer Screening  Never done   COVID-19 Vaccine (4 - 2024-25 season) 05/02/2023   INFLUENZA VACCINE  03/31/2024   Health Maintenance Items Addressed: COLONOSCOPY REFERRAL SENT; MAMMOGRAM UP TO DATE; DECLINES BDS; UP TO DATE W/ PNA & TDAP- NEEDS  SHINGRIX & COVID  Additional Screening:  Vision Screening: Recommended annual ophthalmology exams for early detection of glaucoma and other disorders of the eye. Would you like a referral to an eye doctor? No    Dental Screening: Recommended annual dental exams for proper oral hygiene  Community Resource Referral / Chronic Care Management: CRR required this visit?  No   CCM required this visit?  No   Plan:    I have personally reviewed and noted the following in the patient's chart:   Medical and social history Use of alcohol, tobacco or illicit drugs  Current medications and supplements including opioid prescriptions. Patient is not currently taking opioid prescriptions. Functional ability and status Nutritional status Physical activity Advanced directives List of other physicians Hospitalizations, surgeries, and ER visits in previous 12 months Vitals Screenings to include cognitive, depression, and falls Referrals and appointments  In addition, I have reviewed and discussed with patient certain preventive protocols, quality metrics, and best practice recommendations. A written personalized care plan for preventive services as well as general preventive health recommendations were provided to patient.   Jhonnie GORMAN Das, LPN   [id number]   After Visit Summary: (MyChart) Due to this being a telephonic visit, the after visit summary with patients personalized plan was offered to patient via MyChart   Notes: COLONOSCOPY REFERRAL SENT

## 2024-04-27 NOTE — Patient Instructions (Signed)
 Mary [name] , Thank you for taking time out of your busy schedule to complete your Annual Wellness Visit with me. I enjoyed our conversation and look forward to speaking with you again next year. I, as well as your care team,  appreciate your ongoing commitment to your health goals. Please review the following plan we discussed and let me know if I can assist you in the future.   Follow up Visits: 05/03/25 @ 11:30 AM BY PHONE We will see or speak with you next year for your Next Medicare AWV with our clinical staff Have you seen your provider in the last 6 months (3 months if uncontrolled diabetes)? Yes  Clinician Recommendations:  Aim for 30 minutes of exercise or brisk walking, 6-8 glasses of water, and 5 servings of fruits and vegetables each day. TAKE CARE!      This is a list of the screenings recommended for you:  Health Maintenance  Topic Date Due   DEXA scan (bone density measurement)  Never done   Zoster (Shingles) Vaccine (1 of 2) Never done   Screening for Lung Cancer  Never done   COVID-19 Vaccine (4 - 2024-25 season) 05/02/2023   Flu Shot  03/31/2024   Colon Cancer Screening  11/07/2024*   Mammogram  09/05/2024   Medicare Annual Wellness Visit  04/27/2025   DTaP/Tdap/Td vaccine (2 - Td or Tdap) 09/28/2027   Pneumococcal Vaccine for age over 6  Completed   Hepatitis C Screening  Completed   HPV Vaccine  Aged Out   Meningitis B Vaccine  Aged Out  *Topic was postponed. The date shown is not the original due date.    Advanced directives: (ACP Link)Information on Advanced Care Planning can be found at [place]  Secretary of [facility] Advance Health Care Directives Advance Health Care Directives. [email or web address]  Advance Care Planning is important because it:  [x]  Makes sure you receive the medical care that is consistent with your values, goals, and preferences  [x]  It provides guidance to your family and loved ones and reduces their decisional burden about whether or not they are  making the right decisions based on your wishes.  Follow the link provided in your after visit summary or read over the paperwork we have mailed to you to help you started getting your Advance Directives in place. If you need assistance in completing these, please reach out to us  so that we can help you!

## 2024-05-24 ENCOUNTER — Other Ambulatory Visit: Payer: Self-pay | Admitting: Family Medicine

## 2024-05-24 DIAGNOSIS — I10 Essential (primary) hypertension: Secondary | ICD-10-CM

## 2024-05-25 NOTE — Telephone Encounter (Signed)
 Requested Prescriptions  Pending Prescriptions Disp Refills   amLODipine  (NORVASC ) 5 MG tablet [Pharmacy Med Name: AMLODIPINE  BESYLATE 5 MG Oral Tablet] 90 tablet 0    Sig: TAKE 1 TABLET EVERY DAY     Cardiovascular: Calcium  Channel Blockers 2 Failed - 05/25/2024  1:21 PM      Failed - Last BP in normal range    BP Readings from Last 1 Encounters:  01/17/24 (!) 149/88         Passed - Last Heart Rate in normal range    Pulse Readings from Last 1 Encounters:  01/17/24 81         Passed - Valid encounter within last 6 months    Recent Outpatient Visits           6 months ago Flu-like symptoms   [facility] Leavy Mole, PA-C

## 2024-06-19 ENCOUNTER — Encounter: Admitting: Family Medicine

## 2024-06-21 ENCOUNTER — Encounter: Payer: Self-pay | Admitting: Nurse Practitioner

## 2024-06-21 ENCOUNTER — Ambulatory Visit (INDEPENDENT_AMBULATORY_CARE_PROVIDER_SITE_OTHER): Admitting: Nurse Practitioner

## 2024-06-21 VITALS — BP 136/86 | HR 81 | Temp 98.4°F | Ht 66.0 in | Wt 103.0 lb

## 2024-06-21 DIAGNOSIS — Z23 Encounter for immunization: Secondary | ICD-10-CM

## 2024-06-21 DIAGNOSIS — I10 Essential (primary) hypertension: Secondary | ICD-10-CM | POA: Diagnosis not present

## 2024-06-21 DIAGNOSIS — R7303 Prediabetes: Secondary | ICD-10-CM | POA: Diagnosis not present

## 2024-06-21 DIAGNOSIS — R630 Anorexia: Secondary | ICD-10-CM | POA: Diagnosis not present

## 2024-06-21 DIAGNOSIS — E782 Mixed hyperlipidemia: Secondary | ICD-10-CM

## 2024-06-21 DIAGNOSIS — B370 Candidal stomatitis: Secondary | ICD-10-CM

## 2024-06-21 DIAGNOSIS — D649 Anemia, unspecified: Secondary | ICD-10-CM

## 2024-06-21 DIAGNOSIS — Z13820 Encounter for screening for osteoporosis: Secondary | ICD-10-CM

## 2024-06-21 DIAGNOSIS — Z1211 Encounter for screening for malignant neoplasm of colon: Secondary | ICD-10-CM

## 2024-06-21 MED ORDER — NYSTATIN 100000 UNIT/ML MT SUSP: 500000.0000 [IU] | Freq: Four times a day (QID) | OROMUCOSAL | 0 refills | Status: AC

## 2024-06-21 MED ORDER — MIRTAZAPINE 15 MG PO TBDP: 15.0000 mg | ORAL_TABLET | Freq: Every day | ORAL | 1 refills | Status: DC

## 2024-06-21 NOTE — Assessment & Plan Note (Signed)
 Labs ordered.

## 2024-06-21 NOTE — Progress Notes (Signed)
 Name: Mary [name]   MRN: [id number]    DOB: 14-Feb-1951   Date:2024/07/13       Progress Note  Subjective  Chief Complaint  Chief Complaint  Patient presents with   Annual Exam    Pt has no concerns    HPI  Patient presents for annual CPE.  Diet: Consumes a regular diet, but endorses poor appetite. Does not feel hungry. Denies any other symptoms, such as abdominal pain, nausea, vomiting, or diarrhea. Exercise: Walks frequently Sleep: 7 hours of sleep per night. Last dental exam: Does not remember when last dental exam was.  Last eye exam: Last year  -Was given a manual glucometer during last visit. Checks blood glucose every morning. Blood glucose this morning was 171 prior to eating. Patient verbalizes that glucose numbers vary in range and is sometimes lower.  -Patient verbalizes small bump on anterior chest. Appeared a few weeks ago and previously had white discharge. Denies any changes in size or color. Educated patient to notify if bump grows in size, there are color changes, becomes tender, or develops irregular borders.  Oral thrush: -Patient has white creamy plaque covering anterior tongue. Denies any pain or tenderness. She reports that it has been present for several months. Verbalizes that she is going to make an appointment with dentist for teeth care.   Flowsheet Row Clinical Support from 04/27/2024 in [facility]  AUDIT-C Score 0   PHQ-9 is positive for mild depression. Denies any depressive symptoms. Family member recently passed away.    13-Jul-2024   11:04 AM 04/27/2024   11:34 AM 06/17/2023   10:14 AM 04/23/2023   11:47 AM 04/22/2023    8:54 AM  Depression screen PHQ 2/9  Decreased Interest 0 0 0 0 0  Down, Depressed, Hopeless 1 0 0 0 0  PHQ - 2 Score 1 0 0 0 0  Altered sleeping 0 0   0  Tired, decreased energy 0 0   0  Change in appetite 3 0   0  Feeling bad or failure about yourself  1 0   0  Trouble concentrating 0 0   0   Moving slowly or fidgety/restless 0 0   0  Suicidal thoughts 0 0   0  PHQ-9 Score 5 0   0  Difficult doing work/chores Not difficult at all Not difficult at all  Not difficult at all Not difficult at all   Hypertension: BP Readings from Last 3 Encounters:  2024-07-13 136/86  01/17/24 (!) 149/88  11/08/23 116/68   Obesity: Wt Readings from Last 3 Encounters:  2024/07/13 103 lb (46.7 kg)  01/17/24 108 lb 9.6 oz (49.3 kg)  11/08/23 110 lb (49.9 kg)   BMI Readings from Last 3 Encounters:  2024-07-13 16.62 kg/m  01/17/24 17.53 kg/m  11/08/23 17.75 kg/m    [employer] Visit from 13-Jul-2024 in [facility]  1 29 inches     Vaccines:  HPV: up to at age 50 , ask insurance if age between 41-45  Shingrix: 80-64 yo and ask insurance if covered when patient above 72 yo Pneumonia: Educated and discussed with patient. Flu: Educated and discussed with patient. Received during this visit.  Hep C Screening: Completed 03/29/2019 STD testing and prevention (HIV/chl/gon/syphilis): Completed 01/22/2023 Intimate partner violence: No Sexual History: Sexually active Menstrual History/LMP/Abnormal Bleeding: No abnormal bleeding Incontinence Symptoms: Endorses that she sometimes does not make it to the bathroom in time.  Breast  cancer:  - Last Mammogram: 09/06/2023 - BRCA gene screening: None  Osteoporosis: Discussed high calcium  and vitamin D  supplementation, weight bearing exercises  Cervical cancer screening: Does not qualify.  Skin cancer: Discussed monitoring for atypical lesions  Colorectal cancer: Colonoscopy completed 07/06/2018. She states that her colonoscopy is due as she had polyps present during her last colonoscopy. She does not need a referral and will call to make an appointment. Lung cancer:  Low Dose CT Chest recommended if Age 60-80 years, 20 pack-year currently smoking OR have quit w/in 15 years. Patient does qualify. Declined. ECG:  01/07/2023  Advanced Care Planning: A voluntary discussion about advance care planning including the explanation and discussion of advance directives.  Discussed health care proxy and Living will, and the patient was able to identify a health care proxy as Joien Bonczek (spouse).  Patient does not have a living will at present time. If patient does have living will, I have requested they bring this to the clinic to be scanned in to their chart.  Lipids: Lab Results  Component Value Date   CHOL 122 11/08/2023   CHOL 115 01/28/2023   CHOL 129 10/06/2022   Lab Results  Component Value Date   HDL 50 11/08/2023   HDL 51 01/28/2023   HDL 55 10/06/2022   Lab Results  Component Value Date   LDLCALC 53 11/08/2023   LDLCALC 53 01/28/2023   LDLCALC 55 10/06/2022   Lab Results  Component Value Date   TRIG 104 11/08/2023   TRIG 57 01/28/2023   TRIG 107 10/06/2022   Lab Results  Component Value Date   CHOLHDL 2.4 11/08/2023   CHOLHDL 2.3 01/28/2023   CHOLHDL 2.3 10/06/2022   No results found for: LDLDIRECT  Glucose: Glucose  Date Value Ref Range Status  01/21/2012 117 (H) 65 - 99 mg/dL Final   Glucose, Bld  Date Value Ref Range Status  11/08/2023 87 65 - 99 mg/dL Final    Comment:    .            Fasting reference interval .   03/02/2023 110 (H) 70 - 99 mg/dL Final    Comment:    Glucose reference range applies only to samples taken after fasting for at least 8 hours.  03/01/2023 98 70 - 99 mg/dL Final    Comment:    Glucose reference range applies only to samples taken after fasting for at least 8 hours.   Glucose-Capillary  Date Value Ref Range Status  03/01/2023 140 (H) 70 - 99 mg/dL Final    Comment:    Glucose reference range applies only to samples taken after fasting for at least 8 hours.  03/01/2023 97 70 - 99 mg/dL Final    Comment:    Glucose reference range applies only to samples taken after fasting for at least 8 hours.  03/01/2023 103 (H) 70 - 99 mg/dL  Final    Comment:    Glucose reference range applies only to samples taken after fasting for at least 8 hours.    Patient Active Problem List   Diagnosis Date Noted   Prediabetes 11/10/2023   Atherosclerosis of native arteries of extremity with intermittent claudication 07/19/2023   Carotid stenosis 07/19/2023   History of stroke 06/17/2023   Atherosclerosis of right carotid artery 03/01/2023   Hemiparesis affecting left side as late effect of stroke (HCC) 02/15/2023   Right middle cerebral artery stroke (HCC) 02/02/2023   Stroke (HCC) 01/27/2023   Internal carotid  artery occlusion, right 01/27/2023   Coagulation disorder 01/01/2022   Coronary artery disease 02/16/2019   Benign neoplasm of cecum    Benign neoplasm of transverse colon    Polyp of sigmoid colon    External hemorrhoids    Hip arthritis 10/27/2017   PAD (peripheral artery disease)    Essential hypertension    Mixed hyperlipidemia    Tobacco abuse    Hyperuricemia     Past Surgical History:  Procedure Laterality Date   ANGIOPLASTY  July 2011   PAD- popliteal artery   COLONOSCOPY WITH PROPOFOL  N/A 01/13/2018   Procedure: COLONOSCOPY WITH PROPOFOL ;  Surgeon: Unk Corinn Skiff, MD;  Location: [facility];  Service: Gastroenterology;  Laterality: N/A;   COLONOSCOPY WITH PROPOFOL  N/A 01/14/2018   Procedure: COLONOSCOPY WITH PROPOFOL ;  Surgeon: Janalyn Keene NOVAK, MD;  Location: [facility];  Service: Endoscopy;  Laterality: N/A;   COLONOSCOPY WITH PROPOFOL  N/A 07/06/2018   Procedure: COLONOSCOPY WITH PROPOFOL ;  Surgeon: Janalyn Keene NOVAK, MD;  Location: [facility];  Service: Endoscopy;  Laterality: N/A;   CORONARY ANGIOPLASTY     IR ANGIO INTRA EXTRACRAN SEL COM CAROTID INNOMINATE UNI L MOD SED  03/01/2023   IR ANGIO INTRA EXTRACRAN SEL INTERNAL CAROTID UNI R MOD SED  03/01/2023   IR CT HEAD LTD  01/27/2023   IR CT HEAD LTD  03/01/2023   IR INTRAVSC STENT CERV CAROTID W/EMB-PROT MOD SED INCL ANGIO  03/01/2023    IR PERCUTANEOUS ART THROMBECTOMY/INFUSION INTRACRANIAL INC DIAG ANGIO  01/27/2023   IR RADIOLOGIST EVAL & MGMT  04/07/2023   JOINT REPLACEMENT     Leg Bypass Surgery Right 08/2010   OVARIAN CYST SURGERY  1980s   POPLITEAL ARTERY STENT Right 05/2010   POPLITEAL ARTERY STENT Right 07/2010   RADIOLOGY WITH ANESTHESIA N/A 01/27/2023   Procedure: IR WITH ANESTHESIA;  Surgeon: Radiologist, Medication, MD;  Location: [facility] OR;  Service: Radiology;  Laterality: N/A;   RADIOLOGY WITH ANESTHESIA Right 03/01/2023   Procedure: Right carotid stent;  Surgeon: Dolphus Carrion, MD;  Location: [facility];  Service: Radiology;  Laterality: Right;   TOTAL HIP ARTHROPLASTY Left 10/27/2017   Procedure: TOTAL HIP ARTHROPLASTY;  Surgeon: Cleotilde Barrio, MD;  Location: [facility] ORS;  Service: Orthopedics;  Laterality: Left;    Family History  Problem Relation Age of Onset   Arthritis Mother    Diabetes Mother    Stroke Mother    Hypertension Mother    Dementia Mother    Cancer Father    Arthritis Sister    Seizures Sister    Diabetes Sister    Hypertension Sister    COPD Sister    Cancer Brother    Stroke Maternal Grandmother    Diabetes Paternal Grandmother    Cancer Sister    Diabetes Sister    Arthritis Sister    Diabetes Brother    Stroke Brother    Healthy Brother    Diabetes Brother    Stroke Brother    Cancer Brother    Heart disease Neg Hx    Breast cancer Neg Hx     Social History   Socioeconomic History   Marital status: Married    Spouse name: Joien   Number of children: 0   Years of education: Not on file   Highest education level: 12th grade  Occupational History   Occupation: Disabled  Tobacco Use   Smoking status: Every Day    Current packs/day: 0.50    Average packs/day: 0.5 packs/day  for 40.0 years (20.0 ttl pk-yrs)    Types: Cigarettes   Smokeless tobacco: Never   Tobacco comments:    Down to less than 1/2 pack per day as of 02/25/23  Vaping Use   Vaping status: Never Used   Substance and Sexual Activity   Alcohol use: No   Drug use: No   Sexual activity: Yes  Other Topics Concern   Not on file  Social History Narrative   Not on file   Social Drivers of Health   Financial Resource Strain: Low Risk  (04/27/2024)   Overall Financial Resource Strain (CARDIA)    Difficulty of Paying Living Expenses: Not hard at all  Food Insecurity: No Food Insecurity (04/27/2024)   Hunger Vital Sign    Worried About Running Out of Food in the Last Year: Never true    Ran Out of Food in the Last Year: Never true  Transportation Needs: No Transportation Needs (04/27/2024)   PRAPARE - Administrator, Civil Service (Medical): No    Lack of Transportation (Non-Medical): No  Physical Activity: Insufficiently Active (04/27/2024)   Exercise Vital Sign    Days of Exercise per Week: 3 days    Minutes of Exercise per Session: 30 min  Stress: No Stress Concern Present (04/27/2024)   [employer] of Occupational Health - Occupational Stress Questionnaire    Feeling of Stress: Only a little  Social Connections: Moderately Isolated (04/27/2024)   Social Connection and Isolation Panel    Frequency of Communication with Friends and Family: More than three times a week    Frequency of Social Gatherings with Friends and Family: Twice a week    Attends Religious Services: Never    Database administrator or Organizations: No    Attends Banker Meetings: Never    Marital Status: Married  Catering manager Violence: Not At Risk (04/27/2024)   Humiliation, Afraid, Rape, and Kick questionnaire    Fear of Current or Ex-Partner: No    Emotionally Abused: No    Physically Abused: No    Sexually Abused: No     Current Outpatient Medications:    acetaminophen  (TYLENOL ) 325 MG tablet, Take 2 tablets (650 mg total) by mouth every 4 (four) hours as needed for mild pain (or temp > 37.5 C (99.5 F))., Disp: , Rfl:    amLODipine  (NORVASC ) 5 MG tablet, TAKE 1 TABLET EVERY  DAY, Disp: 90 tablet, Rfl: 0   aspirin  81 MG chewable tablet, Chew 1 tablet (81 mg total) by mouth daily., Disp: 30 tablet, Rfl: 1   atorvastatin  (LIPITOR) 10 MG tablet, TAKE 1 TABLET AT BEDTIME, Disp: 90 tablet, Rfl: 3   Blood Glucose Monitoring Suppl DEVI, 1 each by Does not apply route daily as needed. May substitute to any manufacturer covered by patient's insurance., Disp: 1 each, Rfl: 0   cholecalciferol  (VITAMIN D3) 25 MCG (1000 UNIT) tablet, Take 2 tablets (2,000 Units total) by mouth daily., Disp: 90 tablet, Rfl: 1   clopidogrel  (PLAVIX ) 75 MG tablet, TAKE 1 TABLET EVERY DAY, Disp: 90 tablet, Rfl: 0   mirtazapine  (REMERON  SOL-TAB) 15 MG disintegrating tablet, Take 1 tablet (15 mg total) by mouth at bedtime., Disp: 90 tablet, Rfl: 1   nystatin (MYCOSTATIN) 100000 UNIT/ML suspension, Take 5 mLs (500,000 Units total) by mouth 4 (four) times daily. Swish for 30 seconds and spit out., Disp: 180 mL, Rfl: 0  No Known Allergies   ROS  Constitutional: Negative for fever or weight  change.  Respiratory: Negative for cough and shortness of breath.   Cardiovascular: Negative for chest pain or palpitations.  Gastrointestinal: Negative for abdominal pain, no bowel changes.  Musculoskeletal: Negative for gait problem or joint swelling.  Skin: Negative for rash.  Neurological: Negative for dizziness or headache.  No other specific complaints in a complete review of systems (except as listed in HPI above).   Objective  Vitals:   06/21/24 1024  BP: 136/86  Pulse: 81  Temp: 98.4 F (36.9 C)  SpO2: 97%  Weight: 103 lb (46.7 kg)  Height: 5' 6 (1.676 m)    Body mass index is 16.62 kg/m.  Physical Exam Constitutional:      Appearance: Normal appearance.  HENT:     Head: Normocephalic and atraumatic.     Right Ear: Tympanic membrane, ear canal and external ear normal.     Left Ear: Tympanic membrane, ear canal and external ear normal.     Nose: Nose normal.     Mouth/Throat:      Mouth: Mucous membranes are moist.     Comments: White creamy plaque covering anterior tongue. Eyes:     Extraocular Movements: Extraocular movements intact.     Conjunctiva/sclera: Conjunctivae normal.     Pupils: Pupils are equal, round, and reactive to light.  Cardiovascular:     Rate and Rhythm: Normal rate and regular rhythm.     Heart sounds: Normal heart sounds.  Pulmonary:     Effort: Pulmonary effort is normal.     Breath sounds: Normal breath sounds.  Abdominal:     General: Bowel sounds are normal.     Palpations: Abdomen is soft.  Musculoskeletal:        General: Normal range of motion.     Cervical back: Normal range of motion and neck supple.  Skin:    General: Skin is warm and dry.     Capillary Refill: Capillary refill takes less than 2 seconds.     Comments: Small 4 mm bump on mid anterior chest. No erythema, warmth, or drainage. Nontender to palpitation.  Neurological:     General: No focal deficit present.     Mental Status: She is alert and oriented to person, place, and time. Mental status is at baseline.  Psychiatric:        Mood and Affect: Mood normal.        Behavior: Behavior normal.        Thought Content: Thought content normal.        Judgment: Judgment normal.     No results found for this or any previous visit (from the past 2160 hours).    Fall Risk:    04/27/2024   11:36 AM 06/17/2023   10:14 AM 04/23/2023   11:38 AM 04/22/2023    8:54 AM 03/15/2023   10:33 AM  Fall Risk   Falls in the past year? 0 0 0 1 1  Number falls in past yr: 0 0 0 1 1  Injury with Fall? 0 0 0 1 1  Risk for fall due to : No Fall Risks No Fall Risks No Fall Risks Impaired balance/gait Impaired balance/gait  Follow up Falls evaluation completed;Falls prevention discussed Falls prevention discussed Education provided;Falls prevention discussed Falls prevention discussed;Education provided;Falls evaluation completed Falls prevention discussed;Education provided;Falls  evaluation completed    Functional Status Survey: Is the patient deaf or have difficulty hearing?: No Does the patient have difficulty seeing, even when wearing glasses/contacts?: No Does the patient  have difficulty concentrating, remembering, or making decisions?: No Does the patient have difficulty walking or climbing stairs?: No Does the patient have difficulty dressing or bathing?: No Does the patient have difficulty doing errands alone such as visiting a doctor's office or shopping?: No  Assessment & Plan  Problem List Items Addressed This Visit       Cardiovascular and Mediastinum   Essential hypertension   Labs ordered.      Relevant Orders   Comprehensive metabolic panel with GFR   Microalbumin / creatinine urine ratio     Other   Mixed hyperlipidemia   Labs ordered.      Relevant Orders   Lipid panel   Prediabetes   Labs ordered.      Relevant Orders   Comprehensive metabolic panel with GFR   Hemoglobin A1c   Microalbumin / creatinine urine ratio   Other Visit Diagnoses       Need for influenza vaccination    -  Primary   Relevant Orders   Flu vaccine HIGH DOSE PF(Fluzone Trivalent) (Completed)     Osteoporosis screening       Relevant Orders   DG Bone Density     Anemia, unspecified type       Relevant Orders   CBC with Differential/Platelet     Thrush       Oral thrush present on anterior tongue. Nystatin ordered.   Relevant Medications   nystatin (MYCOSTATIN) 100000 UNIT/ML suspension     Decreased appetite       Endorses lack of appetite. she has been under alot of stress lately.  Remeron  15 mg daily ordered.   Relevant Medications   mirtazapine  (REMERON  SOL-TAB) 15 MG disintegrating tablet   Other Relevant Orders   Ambulatory referral to Gastroenterology     Screening for colon cancer       Relevant Orders   Ambulatory referral to Gastroenterology      Patient is overdue for her colonoscopy.  Discussed with patient to contact her GI  and schedule.   Patient declined lung cancer screening.   -USPSTF grade A and B recommendations reviewed with patient; age-appropriate recommendations, preventive care, screening tests, etc discussed and encouraged; healthy living encouraged; see AVS for patient education given to patient -Discussed importance of 150 minutes of physical activity weekly, eat two servings of fish weekly, eat one serving of tree nuts ( cashews, pistachios, pecans, almonds.SABRA) every other day, eat 6 servings of fruit/vegetables daily and drink plenty of water and avoid sweet beverages.   -Reviewed Health Maintenance: Yes  I have reviewed this encounter including the documentation in this note and/or discussed this patient with the provider, Alexa Everhart SNP, I am certifying that I agree with the content of this note as supervising/preceptor nurse practitioner.  Mliss Spray, FNP-C [facility] 06/21/2024, 1:09 PM

## 2024-06-22 ENCOUNTER — Ambulatory Visit: Payer: Self-pay | Admitting: Nurse Practitioner

## 2024-06-22 LAB — COMPREHENSIVE METABOLIC PANEL WITH GFR
AG Ratio: 1.8 (calc) (ref 1.0–2.5)
ALT: 16 U/L (ref 6–29)
AST: 22 U/L (ref 10–35)
Albumin: 4.6 g/dL (ref 3.6–5.1)
Alkaline phosphatase (APISO): 72 U/L (ref 37–153)
BUN: 12 mg/dL (ref 7–25)
CO2: 29 mmol/L (ref 20–32)
Calcium: 9.8 mg/dL (ref 8.6–10.4)
Chloride: 103 mmol/L (ref 98–110)
Creat: 0.82 mg/dL (ref 0.60–1.00)
Globulin: 2.5 g/dL (ref 1.9–3.7)
Glucose, Bld: 64 mg/dL — ABNORMAL LOW (ref 65–99)
Potassium: 3.6 mmol/L (ref 3.5–5.3)
Sodium: 140 mmol/L (ref 135–146)
Total Bilirubin: 0.4 mg/dL (ref 0.2–1.2)
Total Protein: 7.1 g/dL (ref 6.1–8.1)
eGFR: 76 mL/min/1.73m2 (ref >=60)

## 2024-06-22 LAB — CBC WITH DIFFERENTIAL/PLATELET
Absolute Lymphocytes: 3144 {cells}/uL (ref 850–3900)
Absolute Monocytes: 564 {cells}/uL (ref 200–950)
Basophils Absolute: 18 {cells}/uL (ref 0–200)
Basophils Relative: 0.3 %
Eosinophils Absolute: 132 {cells}/uL (ref 15–500)
Eosinophils Relative: 2.2 %
HCT: 40.6 % (ref 35.0–45.0)
Hemoglobin: 13.4 g/dL (ref 11.7–15.5)
MCH: 29.6 pg (ref 27.0–33.0)
MCHC: 33 g/dL (ref 32.0–36.0)
MCV: 89.8 fL (ref 80.0–100.0)
MPV: 9.7 fL (ref 7.5–12.5)
Monocytes Relative: 9.4 %
Neutro Abs: 2142 {cells}/uL (ref 1500–7800)
Neutrophils Relative %: 35.7 %
Platelets: 274 Thousand/uL (ref 140–400)
RBC: 4.52 Million/uL (ref 3.80–5.10)
RDW: 13.1 % (ref 11.0–15.0)
Total Lymphocyte: 52.4 %
WBC: 6 Thousand/uL (ref 3.8–10.8)

## 2024-06-22 LAB — LIPID PANEL
Cholesterol: 114 mg/dL (ref <200)
HDL: 54 mg/dL (ref >=50)
LDL Cholesterol (Calc): 43 mg/dL
Non-HDL Cholesterol (Calc): 60 mg/dL (ref <130)
Total CHOL/HDL Ratio: 2.1 (calc) (ref <5.0)
Triglycerides: 83 mg/dL (ref <150)

## 2024-06-22 LAB — MICROALBUMIN / CREATININE URINE RATIO
Creatinine, Urine: 64 mg/dL (ref 20–275)
Microalb Creat Ratio: 100 mg/g{creat} — ABNORMAL HIGH (ref <30)
Microalb, Ur: 6.4 mg/dL

## 2024-06-22 LAB — HEMOGLOBIN A1C
Hgb A1c MFr Bld: 6 % — ABNORMAL HIGH (ref <5.7)
Mean Plasma Glucose: 126 mg/dL
eAG (mmol/L): 7 mmol/L

## 2024-06-23 ENCOUNTER — Other Ambulatory Visit: Payer: Self-pay

## 2024-06-23 DIAGNOSIS — I10 Essential (primary) hypertension: Secondary | ICD-10-CM

## 2024-06-23 MED ORDER — LOSARTAN POTASSIUM 25 MG PO TABS: 25.0000 mg | ORAL_TABLET | Freq: Every day | ORAL | 0 refills | Status: DC

## 2024-06-23 MED ORDER — MIRTAZAPINE 15 MG PO TBDP: 15.0000 mg | ORAL_TABLET | Freq: Every day | ORAL | 0 refills | Status: AC

## 2024-06-28 ENCOUNTER — Other Ambulatory Visit: Payer: Self-pay | Admitting: Family Medicine

## 2024-06-28 DIAGNOSIS — I69354 Hemiplegia and hemiparesis following cerebral infarction affecting left non-dominant side: Secondary | ICD-10-CM

## 2024-06-28 DIAGNOSIS — Z5181 Encounter for therapeutic drug level monitoring: Secondary | ICD-10-CM

## 2024-06-28 DIAGNOSIS — D689 Coagulation defect, unspecified: Secondary | ICD-10-CM

## 2024-06-28 DIAGNOSIS — Z09 Encounter for follow-up examination after completed treatment for conditions other than malignant neoplasm: Secondary | ICD-10-CM

## 2024-06-30 NOTE — Telephone Encounter (Signed)
 Requested Prescriptions  Pending Prescriptions Disp Refills   clopidogrel  (PLAVIX ) 75 MG tablet [Pharmacy Med Name: CLOPIDOGREL  75 MG Oral Tablet] 90 tablet 0    Sig: TAKE 1 TABLET EVERY DAY     Hematology: Antiplatelets - clopidogrel  Passed - 06/30/2024 11:22 AM      Passed - HCT in normal range and within 180 days    HCT  Date Value Ref Range Status  06/21/2024 40.6 35.0 - 45.0 % Final   Hematocrit  Date Value Ref Range Status  10/17/2015 39.3 34.0 - 46.6 % Final         Passed - HGB in normal range and within 180 days    Hemoglobin  Date Value Ref Range Status  06/21/2024 13.4 11.7 - 15.5 g/dL Final  97/83/7982 86.1 11.1 - 15.9 g/dL Final         Passed - PLT in normal range and within 180 days    Platelets  Date Value Ref Range Status  06/21/2024 274 140 - 400 Thousand/uL Final  10/17/2015 315 150 - 379 x10E3/uL Final         Passed - Cr in normal range and within 360 days    Creat  Date Value Ref Range Status  06/21/2024 0.82 0.60 - 1.00 mg/dL Final   Creatinine, Urine  Date Value Ref Range Status  06/21/2024 64 20 - 275 mg/dL Final         Passed - Valid encounter within last 6 months    Recent Outpatient Visits           1 week ago Need for influenza vaccination   [facility] Gareth Mliss FALCON, FNP   7 months ago Flu-like symptoms   [facility] Leavy Mole, PA-C

## 2024-08-08 ENCOUNTER — Other Ambulatory Visit: Payer: Self-pay

## 2024-08-08 ENCOUNTER — Telehealth: Payer: Self-pay

## 2024-08-08 DIAGNOSIS — I10 Essential (primary) hypertension: Secondary | ICD-10-CM

## 2024-08-08 MED ORDER — AMLODIPINE BESYLATE 5 MG PO TABS: 5.0000 mg | ORAL_TABLET | Freq: Every day | ORAL | 0 refills | Status: AC

## 2024-08-08 NOTE — Telephone Encounter (Signed)
 Refill request on amLODipine  (NORVASC ) 5 MG tablet

## 2024-08-21 ENCOUNTER — Other Ambulatory Visit: Payer: Self-pay | Admitting: Nurse Practitioner

## 2024-08-21 DIAGNOSIS — I10 Essential (primary) hypertension: Secondary | ICD-10-CM

## 2024-08-21 DIAGNOSIS — R630 Anorexia: Secondary | ICD-10-CM

## 2024-08-21 NOTE — Telephone Encounter (Signed)
 Copied from CRM [id number]. Topic: Clinical - Medication Refill >> Aug 21, 2024  3:37 PM Shanda MATSU wrote: Medication: losartan  (COZAAR ) 25 MG tablet, mirtazapine  (REMERON  SOL-TAB) 15 MG disintegrating tablet   Has the patient contacted their pharmacy? Yes, pharmacy calling on behalf of the patient (Agent: If no, request that the patient contact the pharmacy for the refill. If patient does not wish to contact the pharmacy document the reason why and proceed with request.) (Agent: If yes, when and what did the pharmacy advise?)  This is the patient's preferred pharmacy:  [facility] - [place], [place] - [address] [place] [address] Phone: [phone] Fax: [phone]   Is this the correct pharmacy for this prescription? Yes If no, delete pharmacy and type the correct one.   Has the prescription been filled recently? No  Is the patient out of the medication? Yes  Has the patient been seen for an appointment in the last year OR does the patient have an upcoming appointment? Yes  Can we respond through MyChart? No  Agent: Please be advised that Rx refills may take up to 3 business days. We ask that you follow-up with your pharmacy.

## 2024-08-23 MED ORDER — LOSARTAN POTASSIUM 25 MG PO TABS: 25.0000 mg | ORAL_TABLET | Freq: Every day | ORAL | 0 refills | Status: AC

## 2024-08-23 MED ORDER — MIRTAZAPINE 15 MG PO TBDP: 15.0000 mg | ORAL_TABLET | Freq: Every day | ORAL | 0 refills | Status: AC

## 2024-08-23 NOTE — Telephone Encounter (Signed)
 Requested Prescriptions  Pending Prescriptions Disp Refills   losartan  (COZAAR ) 25 MG tablet 90 tablet 0    Sig: Take 1 tablet (25 mg total) by mouth daily.     Cardiovascular:  Angiotensin Receptor Blockers Passed - 08/23/2024  1:56 PM      Passed - Cr in normal range and within 180 days    Creat  Date Value Ref Range Status  06/21/2024 0.82 0.60 - 1.00 mg/dL Final   Creatinine, Urine  Date Value Ref Range Status  06/21/2024 64 20 - 275 mg/dL Final         Passed - K in normal range and within 180 days    Potassium  Date Value Ref Range Status  06/21/2024 3.6 3.5 - 5.3 mmol/L Final  01/21/2012 2.8 (L) 3.5 - 5.1 mmol/L Final         Passed - Patient is not pregnant      Passed - Last BP in normal range    BP Readings from Last 1 Encounters:  06/21/24 136/86         Passed - Valid encounter within last 6 months    Recent Outpatient Visits           2 months ago Need for influenza vaccination   [facility] Gareth Mliss FALCON, FNP   9 months ago Flu-like symptoms   [facility] Tapia, Leisa, PA-C               mirtazapine  (REMERON  SOL-TAB) 15 MG disintegrating tablet 90 tablet 0    Sig: Take 1 tablet (15 mg total) by mouth at bedtime.     Psychiatry: Antidepressants - mirtazapine  Passed - 08/23/2024  1:56 PM      Passed - Valid encounter within last 6 months    Recent Outpatient Visits           2 months ago Need for influenza vaccination   [facility] Gareth Mliss FALCON, FNP   9 months ago Flu-like symptoms   [facility] Leavy Mole, PA-C

## 2024-09-14 ENCOUNTER — Other Ambulatory Visit: Payer: Self-pay | Admitting: Family Medicine

## 2024-09-14 DIAGNOSIS — Z09 Encounter for follow-up examination after completed treatment for conditions other than malignant neoplasm: Secondary | ICD-10-CM

## 2024-09-14 DIAGNOSIS — D689 Coagulation defect, unspecified: Secondary | ICD-10-CM

## 2024-09-14 DIAGNOSIS — Z5181 Encounter for therapeutic drug level monitoring: Secondary | ICD-10-CM

## 2024-09-14 DIAGNOSIS — I69354 Hemiplegia and hemiparesis following cerebral infarction affecting left non-dominant side: Secondary | ICD-10-CM

## 2024-09-14 MED ORDER — CLOPIDOGREL BISULFATE 75 MG PO TABS: 75.0000 mg | ORAL_TABLET | Freq: Every day | ORAL | 0 refills | Status: AC

## 2024-09-14 NOTE — Telephone Encounter (Signed)
 Copied from CRM [id number]. Topic: Clinical - Medication Refill >> Sep 14, 2024  3:44 PM Larissa S wrote: Medication: clopidogrel  (PLAVIX ) 75 MG tablet  Has the patient contacted their pharmacy? Yes (Agent: If no, request that the patient contact the pharmacy for the refill. If patient does not wish to contact the pharmacy document the reason why and proceed with request.) (Agent: If yes, when and what did the pharmacy advise?)  This is the patient's preferred pharmacy:    [facility] - [place], [place] - [address] [place] [address] Phone: [phone] Fax: [phone]    Is this the correct pharmacy for this prescription? Yes If no, delete pharmacy and type the correct one.   Has the prescription been filled recently? Yes  Is the patient out of the medication? No  Has the patient been seen for an appointment in the last year OR does the patient have an upcoming appointment? Yes  Can we respond through MyChart? No  Agent: Please be advised that Rx refills may take up to 3 business days. We ask that you follow-up with your pharmacy.

## 2024-10-03 ENCOUNTER — Encounter: Admitting: Internal Medicine

## 2025-01-02 ENCOUNTER — Encounter: Admitting: Internal Medicine

## 2025-01-15 ENCOUNTER — Encounter (INDEPENDENT_AMBULATORY_CARE_PROVIDER_SITE_OTHER)

## 2025-01-15 ENCOUNTER — Ambulatory Visit (INDEPENDENT_AMBULATORY_CARE_PROVIDER_SITE_OTHER): Admitting: Vascular Surgery

## 2025-05-03 ENCOUNTER — Ambulatory Visit
